# Patient Record
Sex: Female | Born: 1940
Health system: Southern US, Community
[De-identification: ages and names within clinical notes are randomized; demographics above are authoritative.]

## PROBLEM LIST (undated history)

## (undated) DIAGNOSIS — I10 Essential (primary) hypertension: Secondary | ICD-10-CM

## (undated) DIAGNOSIS — M419 Scoliosis, unspecified: Secondary | ICD-10-CM

## (undated) DIAGNOSIS — R1310 Dysphagia, unspecified: Secondary | ICD-10-CM

## (undated) DIAGNOSIS — IMO0001 Reserved for inherently not codable concepts without codable children: Secondary | ICD-10-CM

## (undated) DIAGNOSIS — I639 Cerebral infarction, unspecified: Secondary | ICD-10-CM

## (undated) DIAGNOSIS — C50912 Malignant neoplasm of unspecified site of left female breast: Secondary | ICD-10-CM

## (undated) DIAGNOSIS — F32A Depression, unspecified: Secondary | ICD-10-CM

## (undated) DIAGNOSIS — F419 Anxiety disorder, unspecified: Secondary | ICD-10-CM

## (undated) DIAGNOSIS — M67911 Unspecified disorder of synovium and tendon, right shoulder: Secondary | ICD-10-CM

## (undated) DIAGNOSIS — C50919 Malignant neoplasm of unspecified site of unspecified female breast: Secondary | ICD-10-CM

## (undated) DIAGNOSIS — S42302A Unspecified fracture of shaft of humerus, left arm, initial encounter for closed fracture: Secondary | ICD-10-CM

## (undated) DIAGNOSIS — E039 Hypothyroidism, unspecified: Secondary | ICD-10-CM

## (undated) DIAGNOSIS — G629 Polyneuropathy, unspecified: Secondary | ICD-10-CM

## (undated) DIAGNOSIS — R410 Disorientation, unspecified: Secondary | ICD-10-CM

## (undated) DIAGNOSIS — IMO0002 Reserved for concepts with insufficient information to code with codable children: Secondary | ICD-10-CM

## (undated) DIAGNOSIS — F329 Major depressive disorder, single episode, unspecified: Secondary | ICD-10-CM

## (undated) DIAGNOSIS — K219 Gastro-esophageal reflux disease without esophagitis: Secondary | ICD-10-CM

## (undated) DIAGNOSIS — M0520 Rheumatoid vasculitis with rheumatoid arthritis of unspecified site: Secondary | ICD-10-CM

## (undated) DIAGNOSIS — M810 Age-related osteoporosis without current pathological fracture: Secondary | ICD-10-CM

## (undated) HISTORY — DX: Unspecified disorder of synovium and tendon, right shoulder: M67.911

## (undated) HISTORY — DX: Reserved for concepts with insufficient information to code with codable children: IMO0002

## (undated) HISTORY — PX: DILATION AND CURETTAGE OF UTERUS: SHX78

## (undated) HISTORY — DX: Essential (primary) hypertension: I10

## (undated) HISTORY — PX: BREAST SURGERY: SHX581

## (undated) HISTORY — DX: Malignant neoplasm of unspecified site of left female breast: C50.912

## (undated) HISTORY — DX: Scoliosis, unspecified: M41.9

## (undated) HISTORY — DX: Age-related osteoporosis without current pathological fracture: M81.0

## (undated) HISTORY — PX: ABDOMINAL HYSTERECTOMY: SHX81

## (undated) HISTORY — DX: Unspecified fracture of shaft of humerus, left arm, initial encounter for closed fracture: S42.302A

## (undated) HISTORY — DX: Gastro-esophageal reflux disease without esophagitis: K21.9

## (undated) HISTORY — DX: Rheumatoid vasculitis with rheumatoid arthritis of unspecified site: M05.20

## (undated) HISTORY — PX: COLONOSCOPY: SHX174

## (undated) HISTORY — DX: Malignant neoplasm of unspecified site of unspecified female breast: C50.919

## (undated) HISTORY — PX: OTHER SURGICAL HISTORY: SHX169

---

## 2001-03-18 ENCOUNTER — Ambulatory Visit (HOSPITAL_COMMUNITY): Admission: RE | Admit: 2001-03-18 | Discharge: 2001-03-18 | Payer: Self-pay | Admitting: Internal Medicine

## 2001-03-18 ENCOUNTER — Encounter: Payer: Self-pay | Admitting: Internal Medicine

## 2002-04-18 ENCOUNTER — Ambulatory Visit (HOSPITAL_COMMUNITY): Admission: RE | Admit: 2002-04-18 | Discharge: 2002-04-18 | Payer: Self-pay | Admitting: Internal Medicine

## 2002-04-18 ENCOUNTER — Encounter (INDEPENDENT_AMBULATORY_CARE_PROVIDER_SITE_OTHER): Payer: Self-pay | Admitting: Internal Medicine

## 2002-05-01 ENCOUNTER — Encounter (HOSPITAL_COMMUNITY): Admission: RE | Admit: 2002-05-01 | Discharge: 2002-05-31 | Payer: Self-pay | Admitting: Rheumatology

## 2002-05-01 ENCOUNTER — Encounter: Payer: Self-pay | Admitting: Rheumatology

## 2002-08-21 ENCOUNTER — Encounter (HOSPITAL_COMMUNITY): Admission: RE | Admit: 2002-08-21 | Discharge: 2002-09-20 | Payer: Self-pay | Admitting: Rheumatology

## 2002-08-21 ENCOUNTER — Encounter: Payer: Self-pay | Admitting: Rheumatology

## 2002-12-18 ENCOUNTER — Encounter (HOSPITAL_COMMUNITY): Admission: RE | Admit: 2002-12-18 | Discharge: 2003-01-17 | Payer: Self-pay | Admitting: Rheumatology

## 2005-07-26 ENCOUNTER — Ambulatory Visit (HOSPITAL_COMMUNITY): Admission: RE | Admit: 2005-07-26 | Discharge: 2005-07-26 | Payer: Self-pay | Admitting: Family Medicine

## 2005-08-02 ENCOUNTER — Ambulatory Visit (HOSPITAL_COMMUNITY): Admission: RE | Admit: 2005-08-02 | Discharge: 2005-08-02 | Payer: Self-pay | Admitting: Family Medicine

## 2005-08-23 ENCOUNTER — Ambulatory Visit (HOSPITAL_COMMUNITY): Admission: RE | Admit: 2005-08-23 | Discharge: 2005-08-23 | Payer: Self-pay | Admitting: Family Medicine

## 2005-09-01 ENCOUNTER — Ambulatory Visit (HOSPITAL_COMMUNITY): Admission: RE | Admit: 2005-09-01 | Discharge: 2005-09-01 | Payer: Self-pay | Admitting: General Surgery

## 2005-09-13 ENCOUNTER — Encounter (INDEPENDENT_AMBULATORY_CARE_PROVIDER_SITE_OTHER): Payer: Self-pay | Admitting: General Surgery

## 2005-09-13 ENCOUNTER — Ambulatory Visit (HOSPITAL_COMMUNITY): Admission: RE | Admit: 2005-09-13 | Discharge: 2005-09-13 | Payer: Self-pay | Admitting: General Surgery

## 2005-09-27 ENCOUNTER — Encounter (INDEPENDENT_AMBULATORY_CARE_PROVIDER_SITE_OTHER): Payer: Self-pay | Admitting: General Surgery

## 2005-09-27 ENCOUNTER — Ambulatory Visit (HOSPITAL_COMMUNITY): Admission: RE | Admit: 2005-09-27 | Discharge: 2005-09-27 | Payer: Self-pay | Admitting: General Surgery

## 2005-10-16 ENCOUNTER — Ambulatory Visit (HOSPITAL_COMMUNITY): Payer: Self-pay | Admitting: Oncology

## 2005-10-16 ENCOUNTER — Encounter (HOSPITAL_COMMUNITY): Admission: RE | Admit: 2005-10-16 | Discharge: 2005-11-15 | Payer: Self-pay | Admitting: Oncology

## 2005-10-16 ENCOUNTER — Encounter: Admission: RE | Admit: 2005-10-16 | Discharge: 2005-10-16 | Payer: Self-pay | Admitting: Oncology

## 2005-10-27 ENCOUNTER — Ambulatory Visit: Admission: RE | Admit: 2005-10-27 | Discharge: 2005-12-29 | Payer: Self-pay | Admitting: *Deleted

## 2005-12-01 ENCOUNTER — Encounter: Admission: RE | Admit: 2005-12-01 | Discharge: 2005-12-01 | Payer: Self-pay | Admitting: Oncology

## 2005-12-01 ENCOUNTER — Ambulatory Visit (HOSPITAL_COMMUNITY): Payer: Self-pay | Admitting: Oncology

## 2006-01-01 ENCOUNTER — Encounter: Admission: RE | Admit: 2006-01-01 | Discharge: 2006-01-01 | Payer: Self-pay | Admitting: Oncology

## 2006-01-01 ENCOUNTER — Encounter (HOSPITAL_COMMUNITY): Admission: RE | Admit: 2006-01-01 | Discharge: 2006-01-31 | Payer: Self-pay | Admitting: Oncology

## 2006-01-16 ENCOUNTER — Encounter (INDEPENDENT_AMBULATORY_CARE_PROVIDER_SITE_OTHER): Payer: Self-pay | Admitting: *Deleted

## 2006-01-16 ENCOUNTER — Ambulatory Visit: Payer: Self-pay | Admitting: Internal Medicine

## 2006-01-16 ENCOUNTER — Ambulatory Visit (HOSPITAL_COMMUNITY): Admission: RE | Admit: 2006-01-16 | Discharge: 2006-01-16 | Payer: Self-pay | Admitting: Internal Medicine

## 2006-01-22 ENCOUNTER — Ambulatory Visit (HOSPITAL_COMMUNITY): Payer: Self-pay | Admitting: Oncology

## 2006-02-15 ENCOUNTER — Encounter (HOSPITAL_COMMUNITY): Admission: RE | Admit: 2006-02-15 | Discharge: 2006-03-17 | Payer: Self-pay | Admitting: Oncology

## 2006-02-15 ENCOUNTER — Encounter: Admission: RE | Admit: 2006-02-15 | Discharge: 2006-02-15 | Payer: Self-pay | Admitting: Oncology

## 2006-03-29 ENCOUNTER — Encounter (HOSPITAL_COMMUNITY): Admission: RE | Admit: 2006-03-29 | Discharge: 2006-04-28 | Payer: Self-pay | Admitting: Oncology

## 2006-03-29 ENCOUNTER — Ambulatory Visit (HOSPITAL_COMMUNITY): Payer: Self-pay | Admitting: Oncology

## 2006-03-29 ENCOUNTER — Encounter: Admission: RE | Admit: 2006-03-29 | Discharge: 2006-03-29 | Payer: Self-pay | Admitting: Oncology

## 2006-05-10 ENCOUNTER — Encounter (HOSPITAL_COMMUNITY): Admission: RE | Admit: 2006-05-10 | Discharge: 2006-06-01 | Payer: Self-pay | Admitting: Oncology

## 2006-05-10 ENCOUNTER — Encounter: Admission: RE | Admit: 2006-05-10 | Discharge: 2006-06-01 | Payer: Self-pay | Admitting: Oncology

## 2006-05-25 ENCOUNTER — Ambulatory Visit (HOSPITAL_COMMUNITY): Payer: Self-pay | Admitting: Oncology

## 2006-06-26 ENCOUNTER — Encounter: Admission: RE | Admit: 2006-06-26 | Discharge: 2006-06-26 | Payer: Self-pay | Admitting: Oncology

## 2006-06-26 ENCOUNTER — Encounter (HOSPITAL_COMMUNITY): Admission: RE | Admit: 2006-06-26 | Discharge: 2006-07-26 | Payer: Self-pay | Admitting: Oncology

## 2006-07-20 ENCOUNTER — Ambulatory Visit (HOSPITAL_COMMUNITY): Payer: Self-pay | Admitting: Oncology

## 2006-07-31 ENCOUNTER — Encounter (HOSPITAL_COMMUNITY): Admission: RE | Admit: 2006-07-31 | Discharge: 2006-07-31 | Payer: Self-pay | Admitting: Oncology

## 2006-07-31 ENCOUNTER — Encounter: Admission: RE | Admit: 2006-07-31 | Discharge: 2006-07-31 | Payer: Self-pay | Admitting: Oncology

## 2006-08-08 ENCOUNTER — Ambulatory Visit (HOSPITAL_COMMUNITY): Admission: RE | Admit: 2006-08-08 | Discharge: 2006-08-08 | Payer: Self-pay | Admitting: Family Medicine

## 2006-08-13 ENCOUNTER — Encounter (HOSPITAL_COMMUNITY): Admission: RE | Admit: 2006-08-13 | Discharge: 2006-09-03 | Payer: Self-pay | Admitting: Oncology

## 2006-08-30 ENCOUNTER — Ambulatory Visit (HOSPITAL_COMMUNITY): Admission: RE | Admit: 2006-08-30 | Discharge: 2006-08-30 | Payer: Self-pay | Admitting: Family Medicine

## 2006-09-05 ENCOUNTER — Encounter (HOSPITAL_COMMUNITY): Admission: RE | Admit: 2006-09-05 | Discharge: 2006-10-05 | Payer: Self-pay | Admitting: Oncology

## 2006-09-05 ENCOUNTER — Ambulatory Visit (HOSPITAL_COMMUNITY): Payer: Self-pay | Admitting: Oncology

## 2006-10-01 ENCOUNTER — Ambulatory Visit: Payer: Self-pay | Admitting: Orthopedic Surgery

## 2006-10-02 ENCOUNTER — Encounter (HOSPITAL_COMMUNITY): Admission: RE | Admit: 2006-10-02 | Discharge: 2006-11-01 | Payer: Self-pay | Admitting: Orthopedic Surgery

## 2006-10-18 ENCOUNTER — Encounter (HOSPITAL_COMMUNITY): Admission: RE | Admit: 2006-10-18 | Discharge: 2006-11-17 | Payer: Self-pay | Admitting: Oncology

## 2006-11-06 ENCOUNTER — Ambulatory Visit: Admission: RE | Admit: 2006-11-06 | Discharge: 2006-11-06 | Payer: Self-pay | Admitting: Orthopedic Surgery

## 2006-11-20 ENCOUNTER — Ambulatory Visit: Payer: Self-pay | Admitting: Orthopedic Surgery

## 2006-11-20 ENCOUNTER — Encounter (HOSPITAL_COMMUNITY): Admission: RE | Admit: 2006-11-20 | Discharge: 2006-12-20 | Payer: Self-pay | Admitting: Oncology

## 2006-12-03 ENCOUNTER — Ambulatory Visit (HOSPITAL_COMMUNITY): Payer: Self-pay | Admitting: Oncology

## 2007-06-03 ENCOUNTER — Encounter (HOSPITAL_COMMUNITY): Admission: RE | Admit: 2007-06-03 | Discharge: 2007-06-04 | Payer: Self-pay | Admitting: Oncology

## 2007-06-03 ENCOUNTER — Ambulatory Visit (HOSPITAL_COMMUNITY): Payer: Self-pay | Admitting: Oncology

## 2007-08-14 ENCOUNTER — Encounter (HOSPITAL_COMMUNITY): Admission: RE | Admit: 2007-08-14 | Discharge: 2007-09-04 | Payer: Self-pay | Admitting: Oncology

## 2007-09-12 ENCOUNTER — Ambulatory Visit (HOSPITAL_COMMUNITY): Admission: RE | Admit: 2007-09-12 | Discharge: 2007-09-12 | Payer: Self-pay | Admitting: Family Medicine

## 2007-09-17 ENCOUNTER — Ambulatory Visit (HOSPITAL_COMMUNITY): Admission: RE | Admit: 2007-09-17 | Discharge: 2007-09-17 | Payer: Self-pay | Admitting: Family Medicine

## 2007-11-25 ENCOUNTER — Ambulatory Visit (HOSPITAL_COMMUNITY): Payer: Self-pay | Admitting: Oncology

## 2008-06-16 ENCOUNTER — Encounter (HOSPITAL_COMMUNITY): Admission: RE | Admit: 2008-06-16 | Discharge: 2008-07-16 | Payer: Self-pay | Admitting: Oncology

## 2008-06-16 ENCOUNTER — Ambulatory Visit (HOSPITAL_COMMUNITY): Payer: Self-pay | Admitting: Oncology

## 2008-07-13 ENCOUNTER — Ambulatory Visit (HOSPITAL_COMMUNITY): Admission: RE | Admit: 2008-07-13 | Discharge: 2008-07-13 | Payer: Self-pay | Admitting: Family Medicine

## 2008-07-13 ENCOUNTER — Encounter: Payer: Self-pay | Admitting: Orthopedic Surgery

## 2008-07-15 DIAGNOSIS — M069 Rheumatoid arthritis, unspecified: Secondary | ICD-10-CM

## 2008-07-16 ENCOUNTER — Ambulatory Visit: Payer: Self-pay | Admitting: Orthopedic Surgery

## 2008-07-16 DIAGNOSIS — S52539A Colles' fracture of unspecified radius, initial encounter for closed fracture: Secondary | ICD-10-CM | POA: Insufficient documentation

## 2008-07-29 ENCOUNTER — Telehealth: Payer: Self-pay | Admitting: Orthopedic Surgery

## 2008-08-17 ENCOUNTER — Ambulatory Visit: Payer: Self-pay | Admitting: Orthopedic Surgery

## 2008-08-17 ENCOUNTER — Ambulatory Visit (HOSPITAL_COMMUNITY): Admission: RE | Admit: 2008-08-17 | Discharge: 2008-08-17 | Payer: Self-pay | Admitting: Orthopedic Surgery

## 2008-08-20 ENCOUNTER — Encounter (HOSPITAL_COMMUNITY): Admission: RE | Admit: 2008-08-20 | Discharge: 2008-09-02 | Payer: Self-pay | Admitting: Oncology

## 2008-12-01 ENCOUNTER — Ambulatory Visit (HOSPITAL_COMMUNITY): Payer: Self-pay | Admitting: Oncology

## 2009-06-14 ENCOUNTER — Ambulatory Visit (HOSPITAL_COMMUNITY): Payer: Self-pay | Admitting: Oncology

## 2009-07-13 ENCOUNTER — Ambulatory Visit (HOSPITAL_COMMUNITY): Admission: RE | Admit: 2009-07-13 | Discharge: 2009-07-13 | Payer: Self-pay | Admitting: Family Medicine

## 2009-07-21 ENCOUNTER — Ambulatory Visit (HOSPITAL_COMMUNITY): Admission: RE | Admit: 2009-07-21 | Discharge: 2009-07-21 | Payer: Self-pay | Admitting: Family Medicine

## 2009-09-02 ENCOUNTER — Ambulatory Visit (HOSPITAL_COMMUNITY): Admission: RE | Admit: 2009-09-02 | Discharge: 2009-09-02 | Payer: Self-pay | Admitting: Oncology

## 2010-01-17 ENCOUNTER — Ambulatory Visit (HOSPITAL_COMMUNITY): Payer: Self-pay | Admitting: Oncology

## 2010-07-22 ENCOUNTER — Ambulatory Visit (HOSPITAL_COMMUNITY): Payer: Self-pay | Admitting: Oncology

## 2010-07-22 ENCOUNTER — Encounter (HOSPITAL_COMMUNITY)
Admission: RE | Admit: 2010-07-22 | Discharge: 2010-08-21 | Payer: Self-pay | Source: Home / Self Care | Attending: Oncology | Admitting: Oncology

## 2010-09-21 ENCOUNTER — Ambulatory Visit (HOSPITAL_COMMUNITY)
Admission: RE | Admit: 2010-09-21 | Discharge: 2010-09-21 | Payer: Self-pay | Source: Home / Self Care | Attending: Internal Medicine | Admitting: Internal Medicine

## 2010-09-25 ENCOUNTER — Encounter: Payer: Self-pay | Admitting: Family Medicine

## 2010-09-25 ENCOUNTER — Encounter (HOSPITAL_COMMUNITY): Payer: Self-pay | Admitting: Oncology

## 2011-01-20 NOTE — Consult Note (Signed)
NAME:  [name], [name]                         ACCOUNT NO.:  [id number]   MEDICAL RECORD NO.:  [id number]                   PATIENT TYPE:   LOCATION:                                       FACILITY:   PHYSICIAN:  Aundra Dubin, M.D.            DATE OF BIRTH:   DATE OF CONSULTATION:  08/21/2002  DATE OF DISCHARGE:                                   CONSULTATION   CHIEF COMPLAINT:  Hands and back.   HISTORY OF PRESENT ILLNESS:  The patient continues to do well using low-dose  prednisone.  She is not having hand or wrist pain at this time.  Her feet  still hurt from known degenerative arthritic changes.  Her main complaint  today is having pain in the right low back that goes around to the right hip  and down some her leg.  This is a throbbing, aching pain.  It can keep her  awake some at night.  Her weight is stable.  There have been no overtly  swollen joints, polyuria, shortness of breath, chest pain, blood to the  stool, or headaches.   MEDICINES:  1. Prednisone 5 mg q.d.  2. Aciphex 20 mg q.d.  3. Calcium with vitamin D b.i.d.   PHYSICAL EXAMINATION:  VITAL SIGNS:  Weight 143 pounds, blood pressure  120/82, respirations 16.  GENERAL:  No distress.  SKIN:  No bruising, clear.  LUNGS:  Clear.  NECK:  Negative JVD, no adenopathy.  HEART:  Regular, no murmur.  LOWER EXTREMITIES:  No edema.  MUSCULOSKELETAL:  The hands appear more to be degenerative with bony  enlargement of the PIPs.  The thumb MCP is somewhat enlarged but other MCPs  are not swollen.  There is some squaring to the first [facility].  Wrists, elbows,  shoulders with good range of motion.  Hips, knees, ankles, and feet have a  good range of motion and show no arthritis except for degenerative changes  at the first MTP.  The right trochanteric bursa is quite tender.   ASSESSMENT AND PLAN:  1. Polyarthritis.  I suspect this is more of a degenerative arthritis.     However, she has significantly improved with the  prednisone and I will     need to work with her to see what type of further arthritic pattern may     emerge.  The plan is for her take 5 mg every-other day over about 10 days     and she will stop the medicine.  I have given her a prescription for     Vioxx 25 mg one q.d.  2. Back pain/possible trochanteric bursitis.  I have shown her how to     stretch this area and have discussed using     heat.  We will have her back x-rayed to rule out any significant OA.  I     have given her Darvocet one b.i.d.  p.r.n. that she can use during the day     and to help with sleep.   She will return in three months.                                               Aundra Dubin, M.D.    WWT/MEDQ  D:  08/21/2002  T:  08/22/2002  Job:  [id number]   cc:   Bernerd Limbo. Leona Carry, M.D.  [address]  [place]  [place] [address]  Fax: [phone]   Lionel December, M.D.  [address]    [place] [address]  Fax: [phone]

## 2011-01-20 NOTE — Procedures (Signed)
NAME:  [name], [name]                   ACCOUNT NO.:  0   MEDICAL RECORD NO.:  [id number]           PATIENT TYPE:   LOCATION:                                 FACILITY:   PHYSICIAN:  Dani Gobble, MD       DATE OF BIRTH:  Oct 29, 1940   DATE OF PROCEDURE:  10/26/2005  DATE OF DISCHARGE:                                  ECHOCARDIOGRAM   REFERRING PHYSICIAN:  Ladona Horns. Mariel Sleet, M.D.   INDICATIONS:  A 70 year old female with breast cancer who is referred for  evaluation of LV function prior chemotherapy.   The technical quality of the study is adequate.   M-MODE TRACINGS:  The aorta measures normally at 2.6 cm.   Left atrium measures normally at 3.8 cm.  No obvious clots or masses were  appreciated and the patient appeared to be in sinus rhythm during the  procedure.   Interventricular septum and posterior wall are at the upper limits of normal  in thickness.   The aortic valve is trileaflet and pliable with normal leaflet excursion.  There is minimal leaflet thickening. There is no limitation of leaflet  excursion.  No significant aortic insufficiency is noted.  Doppler  interrogation of the aortic valve is within normal limits.   The mitral valve also appears structurally normal.  No mitral valve prolapse  is noted.  Mild mitral regurgitation is noted.  Doppler interrogation of the  mitral valve is within normal limits.   The pulmonic valve appears grossly structurally normal.   Tricuspid valve appears grossly structurally normal with mild tricuspid  regurgitation noted.   Left ventricle is normal in size with the LVIDD measured at 4.2 cm and LVISD  measured at 2.9 cm.  Overall left ventricular systolic function is normal  and no regional wall motion abnormalities are noted.   The right atrium and right ventricle are normal in size and right  ventricular systolic function is normal.  No pericardial effusion is  appreciated on this study.   IMPRESSION:  1.  Minimally  thickened aortic valve with mild aortic sclerosis, but no      aortic stenosis.  2.  Mild mitral and tricuspid regurgitation.  3.  Normal left ventricular size and systolic function without regional wall      motion abnormalities noted.           ______________________________  Dani Gobble, MD     AB/MEDQ  D:  10/26/2005  T:  10/26/2005  Job:  [id number]   cc:   Ladona Horns. Mariel Sleet, MD  Fax: [phone]

## 2011-01-20 NOTE — Op Note (Signed)
NAMEWANA, MOUNT               ACCOUNT NO.:  [id number]   MEDICAL RECORD NO.:  [id number]          PATIENT TYPE:  AMB   LOCATION:  DAY                           FACILITY:  [facility]   PHYSICIAN:  Dalia Heading, M.D.  DATE OF BIRTH:  Jan 11, 1941   DATE OF PROCEDURE:  09/27/2005  DATE OF DISCHARGE:                                 OPERATIVE REPORT   PREOPERATIVE DIAGNOSIS:  Left breast carcinoma.   POSTOPERATIVE DIAGNOSIS:  Left breast carcinoma.   PROCEDURE:  Left partial mastectomy.   SURGEON:  Dr. Franky Macho.   ANESTHESIA:  General endotracheal.   INDICATIONS:  The patient is a 70 year old white female status post a left  partial mastectomy with sentinel lymph node biopsy for left breast carcinoma  of September 13, 2005 who was found on microscopic examination to have  carcinoma at the deep margin. She now presents for re-excision of that area.  Risks and benefits of the procedure including bleeding and infection were  fully explained to the patient, who gave informed consent.   PROCEDURE NOTE:  The patient was placed in the supine position. After  induction of general endotracheal anesthesia, the left breast was prepped  and draped using the usual sterile technique with Betadine. Surgical site  confirmation was performed.   Incision was made through the previous lateral breast incision. The  dissection was taken down into the pocket. Serosanguineous fluid was  evacuated. A circumferential dissection around the previous partial  mastectomy cavity was performed. Approximately 2 to 3 cm thickness of  additional tissue was removed. The superior margin was marked with a short  suture, and the lateral margin was marked with a long suture. This was sent  to pathology for further examination. Any bleeding was controlled using  Bovie electrocautery. The wound was irrigated with normal saline. The  subcutaneous layer was reapproximated using 3-0 Vicryl interrupted suture.  The skin  was closed using a 4-0 Vicryl subcuticular suture. Sensorcaine 0.5%  was instilled in the surrounding wound. Dermabond was then applied.   All tape and needle counts were correct at the end of the procedure. The  patient was extubated in the operating room and went back to recovery room  awake in stable condition.   COMPLICATIONS:  None.   SPECIMEN:  Additional breast tissue, left breast.   BLOOD LOSS:  Minimal.      Dalia Heading, M.D.  Electronically Signed     MAJ/MEDQ  D:  09/27/2005  T:  09/27/2005  Job:  [id number]   cc:   Patrica Duel, M.D.  Fax: [phone]

## 2011-01-20 NOTE — H&P (Signed)
[name], [name]               ACCOUNT NO.:  [id number]   MEDICAL RECORD NO.:  [id number]          PATIENT TYPE:  AMB   LOCATION:                                FACILITY:  [facility]   PHYSICIAN:  Dalia Heading, M.D.  DATE OF BIRTH:  1941/05/09   DATE OF ADMISSION:  DATE OF DISCHARGE:  LH                                HISTORY & PHYSICAL   CHIEF COMPLAINT:  Left breast carcinoma.   HISTORY OF PRESENT ILLNESS:  Patient is a 70 year old white female who is  referred for evaluation and treatment of a left breast cancer.  This was  found on routine mammography.  Biopsy reveals an infiltrating ductal  carcinoma.  There is no family history of breast carcinoma.   PAST MEDICAL HISTORY:  Arthritis, extrinsic allergies, anxiety.   PAST SURGICAL HISTORY:  Hysterectomy.   CURRENT MEDICATIONS:  Methotrexate which she is holding, Singulair 10 mg  p.o. p.r.n., Xanax 0.5 mg p.o. when needed, Prilosec one tablet p.o. daily,  calcium supplements, folic acid supplements, stool softener as needed.   ALLERGIES:  No known drug allergies.   REVIEW OF SYSTEMS:  Noncontributory.   PHYSICAL EXAMINATION:  On physical examination, patient is a well-developed,  well-nourished white female in no acute distress.  Neck is supple without  lymphadenopathy.  Lungs clear to auscultation with equal breath sounds  bilaterally.  Heart examination reveals a regular rate and rhythm without  S3, S4, or murmurs.  Right breast examination reveals no dominant mass,  nipple discharge, or dimpling.  The axilla is negative for palpable nodes.  Left breast examination reveals a dominant mass in the upper, outer  quadrant.  No nipple discharge or dimpling is noted.  The axilla is negative  for palpable nodes.  MRI reveals no other lesions except at the one scene.   IMPRESSION:  Infiltrating ductal carcinoma, left breast.   PLAN:  The patient is scheduled to undergo a left partial mastectomy,  sentinel lymph node biopsy,  possible axillary dissection on September 13, 2005.  The risks and benefits of the procedure including bleeding,  infection, and the possibility of an axillary dissection were fully  explained to the patient, who gave informed consent.      Dalia Heading, M.D.  Electronically Signed     MAJ/MEDQ  D:  09/07/2005  T:  09/07/2005  Job:  [id number]   cc:   [facility]  Fax: [phone]   Patrica Duel, M.D.  Fax: [phone]

## 2011-01-20 NOTE — Procedures (Signed)
NAMEPAULA, [name] NO.:  [id number]   MEDICAL RECORD NO.:  [id number]          PATIENT TYPE:  REC   LOCATION:                                FACILITY:  [facility]   PHYSICIAN:  Dani Gobble, MD       DATE OF BIRTH:  12-21-1940   DATE OF PROCEDURE:  04/19/2006  DATE OF DISCHARGE:                                  ECHOCARDIOGRAM   REFERRING:  Ladona Horns. Mariel Sleet, M.D.   INDICATIONS:  A 70 year old female who was referred to evaluate LV function  status post of chemotherapy for breast cancer.   Technical quality of this study is adequate.   The aorta measures normally at 2.8 cm.   Left atrium also measures normally at 3.6 cm.  The patient appeared to be in  sinus rhythm during the procedure.   The interventricular septum is mildly thickened, measured at 1.3 cm while  the posterior wall is within normal limits at 1.1 cm.   The aortic valve is trileaflet with normal leaflet excursion.  Trivial  aortic insufficiency is noted.  Doppler interrogation aortic valve is within  normal limits.   The mitral valve also appears structurally normal.  No mitral valve prolapse  is noted.  Mild mitral regurgitation is noted.  Doppler interrogation of  mitral valve is within normal limits.   The pulmonic valve appears grossly structurally normal.   The tricuspid valve also appears structurally normal with mild tricuspid  regurgitation noted.   The left ventricle is normal in size with the LV/IDD measured at 4.2 cm and  LV/ISD measured at 3.2 cm.  In most views, the overall left ventricular  systolic function appears to be entirely normal.  In the apical two chamber  view, the overall ejection fraction appeared to be at the lower limits of  normal at 50% while in the remaining views it appeared to be entirely normal  and greater than 60-65%.  No regional wall motion abnormalities were noted.  Presence of diastolic dysfunction is inferred from pulse wave Doppler across  the  mitral valve.   The right atrium and right ventricle are normal size and right ventricular  systolic function is normal.   IMPRESSION:  1. Mild thickening of the interventricular septum.  2. Trivial aortic insufficiency.  3. Mild mitral regurgitation.  4. Mild tricuspid regurgitation.  5. Normal left ventricular size with overall ejection fraction in most      views estimated at greater than 60-65% but in the apical two to three      chamber view it appeared to be at the lower limits of normal.  No      regional wall motion abnormality is appreciated.  6. As compared to the prior echocardiogram in May of this year, there is      likely no significant change.           ______________________________  Dani Gobble, MD     AB/MEDQ  D:  04/19/2006  T:  04/19/2006  Job:  [id number]   cc:   Ladona Horns. Mariel Sleet, MD  Fax: [phone]

## 2011-01-20 NOTE — Op Note (Signed)
[name], [name]                         ACCOUNT NO.:  [id number]   MEDICAL RECORD NO.:  [id number]                   PATIENT TYPE:  AMB   LOCATION:  DAY                                  FACILITY:  [facility]   PHYSICIAN:  Lionel December, M.D.                 DATE OF BIRTH:  [birth year]   DATE OF PROCEDURE:  04/18/2002  DATE OF DISCHARGE:                                 OPERATIVE REPORT   PROCEDURE:  Esophagogastroduodenoscopy with esophageal dilatation.   ENDOSCOPIST:  Lionel December, M.D.   INDICATIONS:  This patient is a 70 year old Caucasian female with a history  of GERD who now presents with solid food dysphagia.  Today she is also  complaining of continuous excruciating pain in her left wrist.  She has not  had any relief with NSAIDS.   The procedures and risks were reviewed with the patient and informed consent  was obtained.   PREOPERATIVE MEDICATIONS:  Cetacaine spray for pharyngeal topical  anesthesia, Demerol 50 mg IV and Versed 5 mg IV in divided dose.   INSTRUMENT:  Olympus video system.   FINDINGS:  Procedure performed in endoscopy suite.  The patient's vital  signs and O2 saturation were monitored during the procedure and remained  stable.  The patient was placed in the left lateral recumbent position and  endoscope was passed via the oropharynx without any difficulty into the  esophagus.   ESOPHAGUS:  Mucosa of the esophagus normal except that she had esophageal  erosions and esophagogastric junction.  No hernia was noted.   STOMACH:  It was empty and distended very well with insufflation.  The folds  of the proximal stomach were normal.  Examination of the mucosa revealed  multiple antral erosions along with two ulcers, one was in the prepyloric  area.  The pyloric channel was patent.  The angularis fundus examined by  reflexing the scope and was normal.   DUODENUM:  Examination of the bulb revealed patchy erythema, but no erosions  or ulcers noted.   Mucosa and folds of the second part of the duodenum were  normal.  Endoscope was withdrawn.   Esophageal dilation was performed by passing 56 French Maloney dilator  through the esophagus completely.  After dilatation was complete the  endoscope was passed again and esophageal stricture was noted to have been  disrupted.  The stomach was deep and the endoscope was withdrawn.  The  patient was tolerated the procedure well.   FINAL DIAGNOSES:  1. Erosive reflux esophagitis with soft stricture at esophagogastric     junction which was dilated by passing 56 [place] Maloney dilator.  2. Erosive gastritis with duodenitis along with 2 small antral ulcers.     These are most likely secondary to nonsteroidal anti-inflammatory drug     therapy.   PLAN:  1. Antireflux measures.  2. We will switch her to Aciphex 20 mg p.o. q.a.m.  3. As discussed with Dr. Leona Carry over the phone, she will have extra films     of the left wrist today as a workup for her continuous left wrist pain.                                               Lionel December, M.D.    NR/MEDQ  D:  04/18/2002  T:  04/24/2002  Job:  [id number]

## 2011-01-20 NOTE — H&P (Signed)
NAME:  [name], [name]               ACCOUNT NO.:  [id number]   MEDICAL RECORD NO.:  [id number]          PATIENT TYPE:  AMB   LOCATION:                                FACILITY:  [facility]   PHYSICIAN:  Dalia Heading, M.D.  DATE OF BIRTH:  1940/11/17   DATE OF ADMISSION:  DATE OF DISCHARGE:  LH                                HISTORY & PHYSICAL   CHIEF COMPLAINT:  Left breast carcinoma, unclear margins.   HISTORY OF PRESENT ILLNESS:  The patient is a 70 year old white female  status post a left partial mastectomy with a sentinel lymph node biopsy for  left breast carcinoma on September 13, 2005 who was found on pathology to have  carcinoma at the deep margin.  She now presents for re-excision of that  area.   PAST MEDICAL HISTORY:  Includes arthritis, EXTRINSIC ALLERGIES, anxiety.   PAST SURGICAL HISTORY:  As noted above and hysterectomy.   CURRENT MEDICATIONS:  Singulair, Xanax, Prilosec, calcium supplements, folic  acid supplements.   ALLERGIES:  No known drug allergies.   REVIEW OF SYSTEMS:  Noncontributory.   PHYSICAL EXAMINATION:  GENERAL APPEARANCE:  Patient is a well-developed,  well-nourished white female in no acute distress.  NECK:  Supple without lymphadenopathy.  LUNGS:  Clear to auscultation with equal breath sounds bilaterally.  HEART:  Examination reveals regular rate and rhythm without S3, S4 or  murmurs.  BREASTS:  Left breast examination reveals well-healing surgical incision in  the upper, outer quadrant of the left breast.  The left axillary incision is  also healing well.   IMPRESSION:  Left breast carcinoma, malignancy at deep surgical margin.   PLAN:  The patient is scheduled for re-excision of left breast left partial  mastectomy on September 27, 2005.  The risks and benefits of the procedure  including bleeding, infection, and the possibility of needing further  surgery were fully explained to the patient, who gave informed consent.      Dalia Heading,  M.D.  Electronically Signed     MAJ/MEDQ  D:  09/19/2005  T:  09/19/2005  Job:  [id number]   cc:   Patrica Duel, M.D.  Fax: [phone]   [facility]  Fax: [phone]

## 2011-01-20 NOTE — Consult Note (Signed)
NAME:  [name], [name]                         ACCOUNT NO.:  [id number]   MEDICAL RECORD NO.:  [id number]                   PATIENT TYPE:   LOCATION:                                       FACILITY:   PHYSICIAN:  Aundra Dubin, M.D.            DATE OF BIRTH:   DATE OF CONSULTATION:  12/18/2002  DATE OF DISCHARGE:                                   CONSULTATION   CHIEF COMPLAINT:  1. Low back pain.  2. Right hip pain.   HISTORY OF PRESENT ILLNESS:  The patient is a 70 year old woman whom I  started working with in August 2003 because of hand swelling.  This has  disappeared, and she remains off of prednisone.  She had her lumbar spine x-  rayed in December, and I have reviewed the x-rays with her today; this shows  mild anterior spurring to several vertebrae, and there is slight joint space  loss at L2-3.  There are also facette arthropathy changes.  Her worse  complaint is hurting in the right lateral hip area.  She also hurts across  the low back.  She says when she gets up in the morning, these pains  disappear.  If she lies, she starts to hurt in the lower left abdominal area  and across the low back.  She says there is aching and sometimes radiating  pain down the right hip and leg.  Her weight is stable and is up 2 pounds.  There have been no further swollen joints to the hands, wrists, knees,  ankles, or feet.  She has found that the Darvocet and Vioxx have not helped.   MEDICATIONS:  1. Prilosec 20 mg q.d.  2. Calcium with vitamin D b.i.d.  3. Tylenol 1000 mg h.s.   PHYSICAL EXAMINATION:  VITAL SIGNS:  Weight 145 pounds, blood pressure  122/76, respirations 18.  GENERAL:  She appears well.  LUNGS:  Clear.  HEART:  Regular.  No murmur.  NECK:  Negative JVD.  ABDOMEN:  Nontender.  MUSCULOSKELETAL EXAM:  She has some mild degenerative changes to the DIPs  and squaring at the first [facility]; these areas are not swollen and are  nontender.  Wrists, elbows, shoulders,  neck, knees, ankles, and feet have a  good range of motion and show no arthritis.  She has mild tenderness across  the low back.  The right trochanteric bursa is very tender with wincing.   PROCEDURE:  Right trochanteric bursa injection.  The skin was marked and  cleaned with alcohol and cooled with methylchloride.  Then, 40 mg of Depo-  Medrol in 1 cc of 2% lidocaine was injected.   ASSESSMENT AND PLAN:  1. Trochanteric bursitis.  This has been injected, as above.  She is advised     to rest for about 24 hours and resume normal activities.  2. Back pain.  The type of back pain that she is  describing is quite     atypical for arthritis.  Usually, backs feel better once the weight of     standing is removed; this is not her course.  She also has some mild left     abdominal pain.  I do wonder if this is radiating pain from the bowel.     Part of her difficulty is the bursitis, which has been injected, as     above.  3. Insomnia.  I have given her Xanax to try at night.  She has used this     before and has found it helpful.  4. Osteoarthritis of hands.  This is clearly not an inflammatory arthritis.     Tylenol can help this.  5. She will return in three months.                                               Aundra Dubin, M.D.    WWT/MEDQ  D:  12/18/2002  T:  12/18/2002  Job:  [id number]   cc:   Bernerd Limbo. Leona Carry, M.D.  [address]  [place]  [place] [address]  Fax: [phone]   Lionel December, M.D.  [address]    [place] [address]  Fax: [phone]

## 2011-01-20 NOTE — Op Note (Signed)
[name], [name]               ACCOUNT NO.:  [id number]   MEDICAL RECORD NO.:  [id number]          PATIENT TYPE:  AMB   LOCATION:  DAY                           FACILITY:  [facility]   PHYSICIAN:  Dalia Heading, M.D.  DATE OF BIRTH:  1940/12/20   DATE OF PROCEDURE:  09/13/2005  DATE OF DISCHARGE:                                 OPERATIVE REPORT   PREOPERATIVE DIAGNOSIS:  Left breast carcinoma.   POSTOPERATIVE DIAGNOSIS:  Left breast carcinoma.   PROCEDURE:  Left partial mastectomy, sentinel lymph node biopsy.   SURGEON:  Dr. Franky Macho.   ANESTHESIA:  General endotracheal.   INDICATIONS:  The patient is a 70 year old white female who was diagnosed by  core biopsy by the radiology department at [facility] to have  infiltrating ductal carcinoma in the upper, outer quadrant of the left  breast. MRI was negative for any other mass lesions. The patient now comes  to the operating for left partial mastectomy with sentinel lymph node  biopsy, possible axillary dissection. Risks and benefits of procedure  including bleeding, infection, pain, nerve injury, and the possibility an  axillary dissection were fully explained to the patient, who gave informed  consent.   PROCEDURE NOTE:  The patient was placed in supine position. After induction  of general endotracheal anesthesia, the left breast and axilla were prepped  and draped using the usual sterile technique with Betadine. The patient had  already been injected with radioactive nuclide two hours earlier. The  patient also had been injected with blue dye which was massaged into placed  for five minutes prior to prepping. Surgical site confirmation was  performed.   Using the [employer], an incision was made in the left axilla. This  was taken down to the first sentinel lymph node. The skin counts were 367,  in vivo count 199, ex vivo count 130. The lymph node was blue. A second  sentinel lymph node was found.  Its ex vivo count was 440, and the lymph node  was blue. Basing counts were less than 25. Both lymph nodes were sent for  frozen section. Both lymph nodes were negative for malignancy. There was no  abnormal bleeding noted at the end procedure. The subcutaneous layer was  reapproximated using a 3-0 Vicryl interrupted suture. Skin was closed using  a 4-0 Vicryl subcuticular suture. Sensorcaine 0.5% was instilled in the  surrounding wound. Dermabond was then applied.   A curvilinear incision was then made in the upper, outer quadrant of the  left breast. This was taken down to the mass, and the mass was excised  without difficulty. Normal tissue appeared to be around the mass. The mass  was sent to radiography for specimen radiography. A previously placed  titanium clip was found in the center of the mass removed. The specimen was  then sent to pathology for further examination. A short suture was placed  superiorly, long suture lateral to identify orientation. Any bleeding was  controlled using Bovie electrocautery. Wound was irrigated normal saline.  Subcutaneous layer was reapproximated using a 3-0 Vicryl  interrupted suture.  The skin was closed using a 4-0 Vicryl subcuticular suture. Sensorcaine 0.5%  was instilled into the surrounding wound. Dermabond was then applied.   All tape and needle counts were correct at the end of the procedure. The  patient was extubated in the operating room and went back to recovery room  awake in stable condition.   COMPLICATIONS:  None.   SPECIMEN:  1.  Sentinel lymph node biopsies, left axilla.  2.  Left breast tissue.   BLOOD LOSS:  Minimal.      Dalia Heading, M.D.  Electronically Signed     MAJ/MEDQ  D:  09/13/2005  T:  09/13/2005  Job:  [id number]   cc:   Patrica Duel, M.D.  Fax: [phone]

## 2011-01-20 NOTE — Procedures (Signed)
NAMEMACEE, VENABLES NO.:  [id number]   MEDICAL RECORD NO.:  [id number]          PATIENT TYPE:  REC   LOCATION:                                FACILITY:  [facility]   PHYSICIAN:  Dani Gobble, MD       DATE OF BIRTH:  1941/01/02   DATE OF PROCEDURE:  01/18/2006  DATE OF DISCHARGE:                                  ECHOCARDIOGRAM   REFERRING:  Dr.  Mariel Sleet   INDICATIONS:  A 70 year old female referred to evaluate LV function status  post chemotherapy for breast cancer.   The technical quality of the study is adequate.   The aorta measures normal at 3 cm.   The left atrium also measures normally at 3.7 cm.  The patient appeared to  be in sinus rhythm during this procedure.  No obvious clots or masses were  appreciated.   The intraventricular septum and posterior wall were borderline thickened.   The aortic valve is trileaflet and mildly thickened, but without limitation  to leaflet excursion.  There is no aortic stenosis.  No aortic insufficiency  is noted.   The mitral valve appears minimally thickened, but with no restriction to  leaflet excursion.  Trivial mitral regurgitation is noted.  No mitral valve  prolapse is noted.  Doppler interrogation of the mitral valve is within  normal limits.   Pulmonic valve is incompletely visualized, but appeared to be grossly  structurally normal with trivial pulmonic insufficiency noted.   The tricuspid valve is poorly visualized.   The left ventricle is normal in size with the LV IDD measured at 4 cm and LV  ISD measured at 3 cm.  Overall left systolic function is normal and no  regional wall motion abnormalities are noted.  There is no suggestion of  diastolic dysfunction on this study.   The right atrium and right ventricle are not well visualized, but right  ventricular systolic function overall appears to be normal and the right  atrial size appears to be normal as well.   IMPRESSION:  1.  Borderline  concentric left ventricular hypertrophy.  2.  Trivial mitral regurgitation.  3.  Normal left ventricular size and systolic function without regional wall      motion abnormality noted.  4.  Trivial mitral and pulmonic insufficiency.  5.  Mild aortic sclerosis without stenosis.  6.  As compared to prior echocardiogram from February 2007 there appears to      be no significant change.           ______________________________  Dani Gobble, MD     AB/MEDQ  D:  01/18/2006  T:  01/19/2006  Job:  [id number]

## 2011-01-20 NOTE — Op Note (Signed)
NAMEFRANCYNE, [name]               ACCOUNT NO.:  [id number]   MEDICAL RECORD NO.:  [id number]          PATIENT TYPE:  AMB   LOCATION:  DAY                           FACILITY:  [facility]   PHYSICIAN:  Lionel December, M.D.    DATE OF BIRTH:  February 23, 1941   DATE OF PROCEDURE:  01/16/2006  DATE OF DISCHARGE:                                 OPERATIVE REPORT   PROCEDURE:  Colonoscopy.   INDICATIONS:  [name] is a 70 year old Caucasian female who is here for  screening colonoscopy.  Family history is negative for colorectal carcinoma  and personal history is positive for breast carcinoma.  Procedures were  reviewed with the patient and informed consent was obtained.   MEDS FOR CONSCIOUS SEDATION:  Demerol 50 mg IV, Versed 6 mg IV.   FINDINGS:  Procedure performed in endoscopy suite.  The patient's vital  signs and O2 sat were monitored during the procedure and remained stable.  The patient was placed in the left lateral decubitus position and rectal  examination performed.  No abnormality noted on external or digital exam.  The Olympus videoscope was placed in the rectum and advanced under vision  into the sigmoid colon over a very acute bend.  Redundant sigmoid colon.  Using abdominal pressure and different positions, the scope was advanced  into the splenic flexure and further intubation of the cecum was easy.  The  cecum was identified by ileocecal valve and appendiceal orifice/stump.  There was a small polyp on the folds above the appendiceal orifice which was  ablated via cold biopsy.  As the scope was withdrawn, the colonic mucosa was  once again carefully examined and was normal throughout.  The rectal mucosa  similarly was normal.  The scope was retroflexed to examine the anorectal  junction and small hemorrhoids were noted below the dentate line. The  endoscope was straightened and withdrawn.  The patient tolerated the  procedure well.   FINAL DIAGNOSIS:  Small polyp ablated via cold  biopsy from the cecum.   Small external hemorrhoids, otherwise normal examination. Small cecal polyp  which was ablated via cold biopsy.  External hemorrhoids.   RECOMMENDATIONS:  Standard instructions given. I will be contacting the  patient with results of biopsy and further recommendations.      Lionel December, M.D.  Electronically Signed     NR/MEDQ  D:  01/16/2006  T:  01/16/2006  Job:  [id number]   cc:   Ladona Horns. Mariel Sleet, MD  Fax: [phone]

## 2011-01-20 NOTE — Procedures (Signed)
[name], [name] NO.:  [id number]   MEDICAL RECORD NO.:  [id number]         PATIENT TYPE:  REC   LOCATION:                                FACILITY:  [facility]   PHYSICIAN:  Dani Gobble, MD       DATE OF BIRTH:  [birth year]   DATE OF PROCEDURE:  DATE OF DISCHARGE:                                  ECHOCARDIOGRAM   REFERRING:  Dr. Mariel Sleet.   INDICATION:  Follow-up chemotherapy of LV function.   Technical quality of this study is adequate.   The aorta measures normally at 2.7 cm.   The left atrium also measures normally at 3.8 cm.   The left ventricular septum is mildly thickened at 1.2 cm while the  posterior wall is within normal limits at 1.0 cm.   The aortic valve is trileaflet and mildly thickened but without limitation  to leaflet excursion.  No significant aortic insufficiency is noted.  Doppler interrogation of the aortic valve is within normal limits.   The mitral valve also appears structurally normal.  No mitral valve prolapse  noted.  Mild mitral regurgitation is noted.  Doppler interrogation of mitral  valve is within normal limits.   Pulmonic valve appears structurally normal.  No significant pulmonic  insufficiency is noted.   Tricuspid valve also appeared grossly structurally normal with mild  tricuspid regurgitation noted.   The left ventricle is normal in size with LV IDD measured at 3.9 cm LV ISD  measured at 2.9 cm.  Overall left ventricular systolic function is normal.  In one view only (apical three chamber), the anterior septal wall appeared  mildly hypokinetic.  However, in all other views of the anterior septal wall  it appeared to thicken normally with systole.  Overall ejection fraction,  again, is normal.  The right atrium and right ventricle are normal in size  and right ventricular systolic function is normal.  No pericardial effusion  is noted.   IMPRESSION:  1. Mild asymmetric septal hypertrophy.  2. Mild aortic  sclerosis without stenosis.  3. Mild mitral and tricuspid regurgitation.  4. Normal left ventricular size and overall ejection fraction.  In one      view only, (apical three chamber) the anterior septal wall appeared      mildly hypokinetic but in all other views the anterior septal wall      appeared to thicken normally with systole.  5. As compared to prior echo in August of this year, aortic insufficiency      was not appreciated on this study and      the anterior septal wall abnormality in one view only was newly noted.      However, this was not confirmed in multiple views.  There likely is no      significant change from prior echo.  Consider a stress Myoview if one      has not been performed recently.           ______________________________  Dani Gobble, MD     AB/MEDQ  D:  07/31/2006  T:  08/01/2006  Job:  [id number]   cc:   Ladona Horns. Mariel Sleet, MD  Fax: [phone]

## 2011-01-20 NOTE — Consult Note (Signed)
NAME:  MARIO, VOONG NO.:  [id number]   MEDICAL RECORD NO.:  [id number]                   PATIENT TYPE:   LOCATION:                                       FACILITY:   PHYSICIAN:  Aundra Dubin, M.D.            DATE OF BIRTH:   DATE OF CONSULTATION:  05/01/2002  DATE OF DISCHARGE:                                   CONSULTATION   CHIEF COMPLAINT:  Arm and other pains, swelling.   HISTORY OF PRESENT ILLNESS:  Thank you for allowing me to help in the  patient's care.  She is the 70 year old white female whom we discussed by  telephone one week ago.  She has been having significant pain in the left  wrist with some swelling.  There is also pain in the right wrist but this is  not as severe.  She has had a left wrist x-ray, which I have reviewed, which  shows degenerative arthritic change at the first [facility].  Also, her left  Achilles' tendon has been swollen for about three months.  Her knees ache,  particularly with getting up and down from a sitting position.  She has had  a numb spot to the right lateral thigh or leg for a long time.  Her feet  hurt some but nothing like the wrists.  She has had to use a great deal of  Vicodin to help control the pain.  She is stiff in the mornings for about 30-  45 minutes.   REVIEW OF SYSTEMS:  She denies fever, significant rashes, or weight loss.  Her energy level is pretty good.  Her sleep is somewhat disturbed because of  the pain.  There has been no psoriasis, oral ulcers, alopecia, pleurisy, or  headaches.  She works with Dr. Karilyn Cota because of erosive reflux esophagitis.   PAST MEDICAL/SOCIAL HISTORY:  Erosive esophagitis and dysphagia.  She has  recently worked with Dr. Karilyn Cota concerning this.  She underwent an EGD with  dilatation.   LABORATORY DATA:  Labs checked on April 20, 2002 show a negative H. pylori.  I reviewed cervical neck films.  It shows severe degenerative arthritic  changes.   MEDICATIONS:  1. Aciphex 20 mg q.d.  2. Vicodin 5/500 mg t.i.d.  3. Calcium with vitamin D b.i.d.  4. Advil p.r.n.   DRUG INTOLERANCES:  WYGESIC   FAMILY HISTORY:  Her father is living but is ill with prostatic cancer.  Her  mother has osteoporosis and OA.   SOCIAL HISTORY:  [place]  She has been widowed for 10  years.  She is presently living with her parents to take care of them.  She  never smoked and does not drink alcohol.  She has two grown children.   PHYSICAL EXAMINATION:  VITAL SIGNS:  Weight 141 pounds, height 5 feet 2-1/4  inches, blood pressure 128/70, respirations 14.  GENERAL:  She is overall healthy appearing and appears to be her stated age.  SKIN:  Clear.  HEENT:  PERRL/EOMI.  Mouth clear.  NECK:  No adenopathy.  Normal thyroid.  LUNGS:  Clear.  HEART:  Regular, no murmur.  ABDOMEN:  Negative HSM, nontender.  MUSCULOSKELETAL:  The hands show minor nodularity to the DIP's.  The PIP's  are nonswollen.  The MCP's have fullness but are nontender.  The left  greater than right wrists has some mild swelling.  The left wrist is warm  and there is mild dorsal synovitis.  She guards with flexion at the left  wrist.  Elbows:  Good range of motion.  The shoulders move well but there is  minor discomfort.  Trigger points of the elbows, shoulders, neck, occiput,  anterior chest have moderate tenderness.  The low back had mild tenderness.  Hips:  Good range of motion.  The knees were cool and nontender.  The right  trochanteric bursa was moderately tender.  The left Achilles' tendon was  swollen and quite tender.  The right Achilles' and foot and ankle were  nonswollen, nontender.  NEUROLOGIC:  Strength is 5/5.  DTR's are 2+ throughout.  Negative SLR's.   ASSESSMENT AND PLAN:  1. Bilateral wrist swelling.  This is definitely more on the right.  The x-     rays appear more to suggest a degenerative type of arthritis and also the     neck shows degenerative  arthritis; however, this wrist swelling is     atypical for straight forward osteoarthritis.  I have elected to place     her on a small amount of prednisone to treat this.  She will take 30 mg     for three days, then 20 mg and 15 mg each for one week intervals, and     then lower to 10 mg a day.  She is cautioned that prednisone can increase     her appetite and be associated with weight gain.  She is advised to snack     on whole fruit to avoid weight gain.  She is also advised to continue     taking her calcium.  2. Achilles' tendonitis.  I have asked her to ice this area for 15 minutes     each day for one week.  She will decrease the frequency to about every     other day for 1-2 weeks.  I hope she will keep icing this over a period     of about three weeks.  We may have her go for some stretching exercises     on return.  3. Right trochanteric bursitis.  This is a quite tender area.  She will     stretch this area and we may consider an injection in the future.  4. Reflux esophagitis.  5. Hysterectomy.   Najeeb, it is unusual to have the type of wrist swelling that the patient is  presenting with.  Although less likely, gout is somewhat in the  differential.  Also, inflammatory arthritis is certainly positive.  We will  check a CBC, CMET, RF, ESR, ANA, and a uric acid.  I will be seeing her back  in four weeks.  Thank you.  Aundra Dubin, M.D.    WWT/MEDQ  D:  05/01/2002  T:  05/02/2002  Job:  [id number]   cc:   Lionel December, M.D.

## 2011-01-20 NOTE — Consult Note (Signed)
[name], [name]                         ACCOUNT NO.:  [id number]   MEDICAL RECORD NO.:  [id number]                   PATIENT TYPE:  AMB   LOCATION:  DAY                                  FACILITY:  [facility]   PHYSICIAN:  Aundra Dubin, M.D.            DATE OF BIRTH:  May 01, 1941   DATE OF CONSULTATION:  05/29/2002  DATE OF DISCHARGE:  04/18/2002                                   CONSULTATION   CHIEF COMPLAINT:  Hands, wrists, and swollen left Achilles tendon.   HISTORY OF PRESENT ILLNESS:  The patient returns after being on the  prednisone and is feeling a great deal better.  She says the achiness and  pain to the hands, wrists, and shoulders is gone.  She is not swollen to the  left Achilles, and she is able to walk and mow her grass.  Her weight is  down two pounds.  There is no back pain, polyuria, or polydipsia.  She has  had no rashes.  Her energy level is quite good.  She is sleeping well.  There have been no headaches, diarrhea, blood or mucous in the bowel  movement.   LABORATORY DATA:  From May 01, 2002, showed a rheumatoid factor of 172 (0-  20).  WBC was 6.9, hemoglobin 12.7, platelets 332.  Glucose 138, creatinine  0.7, AST 17, albumin 3.6.  Uric acid 3.6.  She had negative serologies for  H. pylori.   MEDICATIONS:  1. Prednisone 10 mg q.d.  2. Aciphex 20 mg q.d.  3. Vicodin, rare.  4. Calcium with vitamin D b.i.d.   PHYSICAL EXAMINATION:  VITAL SIGNS:  Weight 139 pounds.  Blood pressure  118/68, respirations 14.  GENERAL:  No distress.  LUNGS:  Clear.  HEART:  Regular.  No murmur.  NECK:  Negative JVD.  SKIN:  Clear.  MUSCULOSKELETAL:  The hands have bony enlargement to the PIPs.  They are  nontender.  The fullness to the MCPs is not evident today, and these joints  are cool and nontender.  The wrists have no swelling or warmth and are  nontender.  Elbows, shoulders good range of motion with no wincing.  Knees,  ankles, and feet all have a good  range of motion and show no arthritis.  The  left Achilles tendon is not swollen or tender today.   ASSESSMENT AND PLAN:  Polyarthritis:  She is showing really no swelling at  this time.  She does have the positive rheumatoid factor.  With the number  of joints that are involved and the slight swelling that I was seeing, this  could possibly be emerging rheumatoid arthritis.  I would not make that  diagnosis at this point and have to follow  her to see if it emerges or not.  The plan is to decrease the prednisone to  7.5 mg q.d. for one month, then 5 mg q.d.  She  has done very well with  controlling her weight.  She is taking the calcium.   She will return in two months.                                               Aundra Dubin, M.D.    WWT/MEDQ  D:  05/29/2002  T:  05/30/2002  Job:  [id number]   cc:   Lionel December, M.D.

## 2011-01-20 NOTE — Procedures (Signed)
[name], [name] NO.:  [id number]   MEDICAL RECORD NO.:  [id number]          PATIENT TYPE:  REC   LOCATION:                                FACILITY:  [facility]   PHYSICIAN:  Dani Gobble, MD       DATE OF BIRTH:  [birth year]   DATE OF PROCEDURE:  11/21/2006  DATE OF DISCHARGE:                                ECHOCARDIOGRAM   REFERRING PHYSICIAN:  Ladona Horns. Neijstrom, MD.   INDICATIONS:  Evaluate LV function post chemotherapy for breast cancer.   The technical quality of the study is adequate.   Aorta measures normally at 3.9 cm.   The left atrium measures normally at 3.5 cm.  The patient appeared to be  in sinus rhythm during the procedure.   The interventricular septum and posterior wall are within normal limits  in thickness.  There is mild basal septal hypertrophy without LVOT  obstruction.   The aortic valve is mildly thickened but trileaflet with normal leaflet  excursion.  Trivial aortic stenosis is appreciated.  No aortic  insufficiency is noted.  Doppler interrogation of the aortic valve is  within normal limits.   The mitral valve also appears minimally thickened without limitation of  leaflet excursion.  No mitral valve prolapse is noted.  Trivial mitral  regurgitation is noted.  Doppler interrogation of mitral valve is within  normal limits.   The pulmonic valve is incompletely visualized but appeared to be grossly  structurally normal.   Tricuspid valve also appears structurally normal with trace to mild  tricuspid regurgitation.   The left ventricle is normal in size with LVIDD measured at 4.2 cm LVISD  measured 3.2 cm.  Overall left ventricular systolic function is normal  and no regional wall motion abnormalities are noted.   The right atrium, right ventricle normal in size and right ventricular  systolic function is normal.   IMPRESSION:  1. Mild basal septal hypertrophy without left ventricular outflow      tract obstruction noted.  2. Mild aortic sclerosis without stenosis.  3. Trivial aortic insufficiency.  4. Trivial mitral regurgitation.  5. Trace to mild tricuspid regurgitation.  6. Normal left ventricular size and systolic function without regional      wall motion abnormality noted.  7. As compared to prior study in November 2007, previous suggestion in      one view only of anteroseptal hypokinesis is not appreciated on      this study.  Otherwise no significant change.           ______________________________  Dani Gobble, MD     AB/MEDQ  D:  11/21/2006  T:  11/21/2006  Job:  [id number]   cc:   Ladona Horns. Mariel Sleet, MD  Fax: [phone]   Dani Gobble, MD  Fax: [phone]

## 2011-01-20 NOTE — Consult Note (Signed)
NAMEBRINSLEY, WENCE                           ACCOUNT NO.:  [id number]   MEDICAL RECORD NO.:  [id number]                  PATIENT TYPE:   LOCATION:                                       FACILITY:   PHYSICIAN:  [id number]                                DATE OF BIRTH:  Dec 20, 1940   DATE OF CONSULTATION:  04/14/2002  DATE OF DISCHARGE:                                   CONSULTATION   REASON FOR CONSULTATION:  Dysphagia.   HISTORY OF PRESENT ILLNESS:  The patient is a pleasant 70 year old Caucasian  female patient of Dr. Leona Carry, who presents today for further evaluation  of dysphagia.  She complains of dysphagia for the past four years.  She has  trouble eating solids, especially meats, at times.  She can feel the food  get stuck and at times has had to vomit the food.  She denies any  odynophagia.  She rarely has heartburn symptoms as long as she takes her  Zantac.  She has been on this four to five years.  Denies any nausea,  abdominal pain, diarrhea, melena, or rectal bleeding.  She recently was  tried on Celebrex for arthritis but only took this for approximately one  week.  She was given ibuprofen 800 mg t.i.d. for the past one week as well.  Prior to that she had been on over-the-counter ibuprofen for arthritis.  Also, she has been on Aleve as needed for arthritis.   MEDICATIONS:  Zantac 75 mg b.i.d., calcium plus D 1000 mg b.i.d., ibuprofen  800 mg t.i.d.   ALLERGIES:  WYGESIC.   PAST MEDICAL HISTORY:  Arthritis, she also has severe degenerative disk  disease of the neck.   PAST SURGICAL HISTORY:  Partial hysterectomy in 1989.  D&C with uterine  polypectomy in 1987.   FAMILY HISTORY:  Her mother and family are both disabled.  No family history  of colorectal cancer or chronic GI illness.   SOCIAL HISTORY:  She is widowed.  She has two children.  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  Otherwise unemployed.  Never been a smoker.  Denies any  alcohol use.   REVIEW OF  SYMPTOMS:  Please see HPI for GI.  Also, she complains of  constipation but generally will have one bowel movement daily.  Denies any  weight loss.   PHYSICAL EXAMINATION:  VITAL SIGNS:  Weight 144, height 5 feet 2 inches.  Temperature 97.1, blood pressure 130/73, pulse 56.  GENERAL:  A very pleasant, well-nourished, well-developed Caucasian female  in no acute distress.  SKIN:  Warm and dry.  No jaundice.  HEENT:  Conjunctivae are pink.  Sclerae are not icteric.  Oropharyngeal  mucosa is moist and pink, no erythema, lesion, or exudate.  NECK:  No lymphadenopathy or thyromegaly or carotid bruits.  CHEST:  Lungs are clear to auscultation.  CARDIAC:  Regular rate and rhythm, normal S1, S2, no murmurs, rubs, or  gallops.  ABDOMEN:  Positive bowel sounds, soft, nontender, nondistended, no  organomegaly or masses.  EXTREMITIES:  No edema.   IMPRESSION:  The patient is a pleasant 70 year old Caucasian female with a  four-year history of solid food esophageal dysphagia.  She has had several  episodes of what sounds like food impaction but was able to reduce this  herself.  She also has typical heartburn symptoms if she does not take any  antacid.  She is on nonsteroidal anti-inflammatory drug use chronically for  arthritis.  Her differential diagnosis would esophageal web, ring, or  stricture.  She may have a complication of gastroesophageal reflux disease  or even peptic ulcer disease as well.  She complains of mild constipation as  well.   PLAN:  1. EGD with dilatation in the near future.  2. She will begin fiber _______ two tablets daily.  3. Colace 200 mg daily as needed.  4. Recommend colonoscopy at some point in the near future for colorectal     cancer screening.   I would like to thank Dr. Leona Carry for allowing [place] to take part in the care  of this patient.     Tana Coast, [place]                          [id number]    LL/MEDQ  D:  04/14/2002  T:  04/14/2002  Job:  [id number]   cc:    Bernerd Limbo. Leona Carry, M.D.

## 2011-01-25 ENCOUNTER — Encounter (HOSPITAL_COMMUNITY): Payer: Self-pay | Admitting: Oncology

## 2011-01-25 ENCOUNTER — Encounter (HOSPITAL_COMMUNITY): Payer: Medicare Other | Attending: Oncology | Admitting: Oncology

## 2011-01-25 ENCOUNTER — Ambulatory Visit (HOSPITAL_COMMUNITY): Payer: Self-pay | Admitting: Oncology

## 2011-01-25 DIAGNOSIS — C439 Malignant melanoma of skin, unspecified: Secondary | ICD-10-CM

## 2011-04-18 ENCOUNTER — Other Ambulatory Visit (HOSPITAL_COMMUNITY): Payer: Self-pay | Admitting: Internal Medicine

## 2011-04-18 ENCOUNTER — Ambulatory Visit (HOSPITAL_COMMUNITY)
Admission: RE | Admit: 2011-04-18 | Discharge: 2011-04-18 | Disposition: A | Discharge status: Home / Self Care | Payer: Medicare Other | Source: Ambulatory Visit | Attending: Internal Medicine | Admitting: Internal Medicine

## 2011-04-18 DIAGNOSIS — M549 Dorsalgia, unspecified: Secondary | ICD-10-CM

## 2011-04-18 DIAGNOSIS — M545 Low back pain, unspecified: Secondary | ICD-10-CM | POA: Insufficient documentation

## 2011-04-18 DIAGNOSIS — M546 Pain in thoracic spine: Secondary | ICD-10-CM | POA: Insufficient documentation

## 2011-06-05 LAB — MISCELLANEOUS TEST

## 2011-06-15 LAB — COMPREHENSIVE METABOLIC PANEL
AST: 22
Albumin: 3.8
Alkaline Phosphatase: 77
Chloride: 104
GFR calc Af Amer: >60
Potassium: 3.7
Sodium: 141
Total Bilirubin: 0.5
Total Protein: 6.5

## 2011-06-15 LAB — CBC
HCT: 44.5
Platelets: 266
RDW: 15.7 — ABNORMAL HIGH
WBC: 6.3

## 2011-10-11 ENCOUNTER — Other Ambulatory Visit (HOSPITAL_COMMUNITY): Payer: Self-pay | Admitting: Internal Medicine

## 2011-10-11 DIAGNOSIS — Z139 Encounter for screening, unspecified: Secondary | ICD-10-CM

## 2011-10-25 ENCOUNTER — Ambulatory Visit (HOSPITAL_COMMUNITY)
Admission: RE | Admit: 2011-10-25 | Discharge: 2011-10-25 | Disposition: A | Discharge status: Home / Self Care | Payer: Medicare Other | Source: Ambulatory Visit | Attending: Internal Medicine | Admitting: Internal Medicine

## 2011-10-25 DIAGNOSIS — Z853 Personal history of malignant neoplasm of breast: Secondary | ICD-10-CM | POA: Insufficient documentation

## 2011-10-25 DIAGNOSIS — Z139 Encounter for screening, unspecified: Secondary | ICD-10-CM

## 2011-10-25 DIAGNOSIS — Z9889 Other specified postprocedural states: Secondary | ICD-10-CM

## 2011-11-29 ENCOUNTER — Other Ambulatory Visit (HOSPITAL_COMMUNITY): Payer: Self-pay | Admitting: Internal Medicine

## 2011-11-29 DIAGNOSIS — Z139 Encounter for screening, unspecified: Secondary | ICD-10-CM

## 2011-12-06 ENCOUNTER — Other Ambulatory Visit (HOSPITAL_COMMUNITY): Payer: Medicare Other

## 2012-01-02 ENCOUNTER — Ambulatory Visit (HOSPITAL_COMMUNITY)
Admission: RE | Admit: 2012-01-02 | Discharge: 2012-01-02 | Disposition: A | Discharge status: Home / Self Care | Payer: Medicare Other | Source: Ambulatory Visit | Attending: Internal Medicine | Admitting: Internal Medicine

## 2012-01-02 DIAGNOSIS — M810 Age-related osteoporosis without current pathological fracture: Secondary | ICD-10-CM | POA: Insufficient documentation

## 2012-01-02 DIAGNOSIS — Z139 Encounter for screening, unspecified: Secondary | ICD-10-CM | POA: Insufficient documentation

## 2012-01-24 ENCOUNTER — Encounter (HOSPITAL_COMMUNITY): Payer: Medicare Other | Attending: Oncology | Admitting: Oncology

## 2012-01-24 ENCOUNTER — Encounter (HOSPITAL_COMMUNITY): Payer: Self-pay | Admitting: Oncology

## 2012-01-24 VITALS — BP 143/78 | HR 77 | Temp 98.2°F | Ht 61.5 in | Wt 138.9 lb

## 2012-01-24 DIAGNOSIS — C50419 Malignant neoplasm of upper-outer quadrant of unspecified female breast: Secondary | ICD-10-CM

## 2012-01-24 DIAGNOSIS — C50919 Malignant neoplasm of unspecified site of unspecified female breast: Secondary | ICD-10-CM

## 2012-01-24 DIAGNOSIS — M4120 Other idiopathic scoliosis, site unspecified: Secondary | ICD-10-CM

## 2012-01-24 DIAGNOSIS — G8929 Other chronic pain: Secondary | ICD-10-CM

## 2012-01-24 DIAGNOSIS — Z170 Estrogen receptor positive status [ER+]: Secondary | ICD-10-CM

## 2012-01-24 NOTE — Patient Instructions (Signed)
[name]  [id number] Nov 03, 1940 Dr. Glenford Peers   [facility]  Discharge Instructions  RECOMMENDATIONS MADE BY THE CONSULTANT AND ANY TEST RESULTS WILL BE SENT TO YOUR REFERRING DOCTOR.   EXAM FINDINGS BY MD TODAY AND SIGNS AND SYMPTOMS TO REPORT TO CLINIC OR PRIMARY MD: Exam and discussion per MD.  You are doing well.  No evidence of recurrence.  MEDICATIONS PRESCRIBED: none   INSTRUCTIONS GIVEN AND DISCUSSED: Other :  Report any new lumps, bone pain or shortness of breath.  SPECIAL INSTRUCTIONS/FOLLOW-UP: Return to Clinic in 1 year.   I acknowledge that I have been informed and understand all the instructions given to me and received a copy. I do not have any more questions at this time, but understand that I may call the Specialty Clinic at [facility] at [phone] during business hours should I have any further questions or need assistance in obtaining follow-up care.    __________________________________________  _____________  __________ Signature of Patient or Authorized Representative            Date                   Time    __________________________________________ Nurse's Signature

## 2012-01-24 NOTE — Progress Notes (Signed)
CC:   [name], M.D. Dalia Heading, M.D. Billie Lade, Ph.D., M.D.  DIAGNOSES: 1. Stage I (T1c N0 M0) infiltrating ductal carcinoma of the left     breast status post lumpectomy and sentinel node biopsy with 3     negative nodes.  Her cancer was ER positive 52%, PR positive 7%,     HER-2/neu 2+ by [facility] but 3+ positive by FISH.  Ki-67 marker was low     at 7%.  Interestingly, no lymphovascular invasion was seen and she     was felt to be grade 1.  She accepted only Herceptin for a year and     Aromasin therapy but we switched her to tamoxifen due to cost.  The     hormonal therapy started February 2007 and she completed 5 full     years of therapy as of last year.  She took a year of Herceptin.     She refused radiation therapy and she refused chemotherapy. 2. Rheumatoid arthritis treated by Dr. Gavin Potters. 3. Gastroesophageal reflux disease. 4. Right rotator cuff partial tear. 5. Left broken arm. 6. Right biceps head tear of one of the heads, not both. 7. Degenerative disk disease with scoliosis and chronic back pain and     left leg sciatica. 8. Wygesic-induced nausea and vomiting. 9. Benadryl-induced hyperactivity. 10.Vaginal hysterectomy in 1989 for benign disease.  HISTORY:  [name] is still taking care of both of her parents who are 61 and 37, somewhat demented, having more and more problems.  She hardly gets any sleep at night because 1 of them is either up and about or confused.  She tore her right biceps about 4 or 5 months ago and I think it was just a single head, not both heads.  She is not having any breathing problems.  Chronic back problems still remain.  Her joints are fairly stable on her therapy through Dr. Gavin Potters.  Bowel function is fine.  Her bone density recently did show some osteoporosis and osteopenia and she is still on her therapy.  She takes Fosamax weekly 70 mg and 2 calcium with vitamin D, and the vitamin D is at least 2000  units.  PHYSICAL EXAMINATION:  General:  She is alert.  She is still oriented. Her back still shows a scoliotic changes but she has clear lung fields. The right biceps tendon has been torn partially.  She has decreased range of motion of the right shoulder with some discomfort when she elevates it or tries to internally rotate it.  Breasts:  Shows fibrocystic changes to both sides but no masses.  Heart:  Regular rhythm and rate without murmur or gallop.  Abdomen:  Soft and nontender without hepatosplenomegaly.  She has no peripheral edema.  I think she is doing as well as we can hope.  She had blood work by Dr. Catalina Pizza, I will not need to repeat that.  We will see her back in a year, sooner if need be.    ______________________________ Ladona Horns. Mariel Sleet, MD ESN/MEDQ  D:  01/24/2012  T:  01/24/2012  Job:  [id number]

## 2012-01-24 NOTE — Progress Notes (Signed)
This office note has been dictated.

## 2012-11-20 ENCOUNTER — Other Ambulatory Visit (HOSPITAL_COMMUNITY): Payer: Self-pay | Admitting: Internal Medicine

## 2012-11-20 DIAGNOSIS — M549 Dorsalgia, unspecified: Secondary | ICD-10-CM

## 2012-11-21 ENCOUNTER — Other Ambulatory Visit (HOSPITAL_COMMUNITY): Payer: Self-pay

## 2012-11-21 DIAGNOSIS — R0602 Shortness of breath: Secondary | ICD-10-CM

## 2012-11-26 ENCOUNTER — Ambulatory Visit (HOSPITAL_COMMUNITY): Payer: Medicare Other

## 2012-11-26 ENCOUNTER — Ambulatory Visit (HOSPITAL_COMMUNITY)
Admission: RE | Admit: 2012-11-26 | Discharge: 2012-11-26 | Disposition: A | Discharge status: Home / Self Care | Payer: Medicare Other | Source: Ambulatory Visit | Attending: Internal Medicine | Admitting: Internal Medicine

## 2012-12-03 ENCOUNTER — Ambulatory Visit (HOSPITAL_COMMUNITY)
Admission: RE | Admit: 2012-12-03 | Discharge: 2012-12-03 | Disposition: A | Discharge status: Home / Self Care | Payer: Medicare Other | Source: Ambulatory Visit | Attending: Internal Medicine | Admitting: Internal Medicine

## 2012-12-03 DIAGNOSIS — R0602 Shortness of breath: Secondary | ICD-10-CM | POA: Insufficient documentation

## 2012-12-03 DIAGNOSIS — J449 Chronic obstructive pulmonary disease, unspecified: Secondary | ICD-10-CM | POA: Insufficient documentation

## 2012-12-03 DIAGNOSIS — J4489 Other specified chronic obstructive pulmonary disease: Secondary | ICD-10-CM | POA: Insufficient documentation

## 2012-12-03 MED ORDER — ALBUTEROL SULFATE (5 MG/ML) 0.5% IN NEBU
2.5000 mg | INHALATION_SOLUTION | Freq: Once | RESPIRATORY_TRACT | Status: AC
  Administered 2012-12-03: 2.5 mg via RESPIRATORY_TRACT

## 2012-12-04 ENCOUNTER — Ambulatory Visit (HOSPITAL_COMMUNITY)
Admission: RE | Admit: 2012-12-04 | Discharge: 2012-12-04 | Disposition: A | Discharge status: Home / Self Care | Payer: Medicare Other | Source: Ambulatory Visit | Attending: Internal Medicine | Admitting: Internal Medicine

## 2012-12-04 DIAGNOSIS — M549 Dorsalgia, unspecified: Secondary | ICD-10-CM

## 2012-12-04 DIAGNOSIS — M5124 Other intervertebral disc displacement, thoracic region: Secondary | ICD-10-CM | POA: Insufficient documentation

## 2012-12-04 DIAGNOSIS — M545 Low back pain, unspecified: Secondary | ICD-10-CM | POA: Insufficient documentation

## 2012-12-04 DIAGNOSIS — M546 Pain in thoracic spine: Secondary | ICD-10-CM | POA: Insufficient documentation

## 2012-12-04 DIAGNOSIS — M5126 Other intervertebral disc displacement, lumbar region: Secondary | ICD-10-CM | POA: Insufficient documentation

## 2012-12-05 NOTE — Procedures (Signed)
[name], PALL NO.:  [id number]  MEDICAL RECORD NO.:  [id number]  LOCATION:                                 FACILITY:  PHYSICIAN:  [name], M.D.DATE OF BIRTH:  Oct 13, 1940  DATE OF PROCEDURE:  12/04/2012 DATE OF DISCHARGE:                           PULMONARY FUNCTION TEST   REASON FOR PULMONARY FUNCTION TESTING:  Shortness of breath.  1. Spirometry shows no ventilatory defect with evidence of airflow     obstruction. 2. Lung volumes are normal. 3. DLCO is mildly reduced. 4. Airway resistance is slightly elevated confirming the presence of     airflow obstruction. 5. There is no significant bronchodilator improvement. 6. Pulmonary function test does show evidence of airflow obstruction     which is mild.     [name], M.D.     ELH/MEDQ  D:  12/04/2012  T:  12/05/2012  Job:  [id number]  cc:   Catalina Pizza, M.D. Fax: [phone]

## 2012-12-11 NOTE — Procedures (Signed)
[name], LEIS NO.:  [id number]  MEDICAL RECORD NO.:  [id number]  LOCATION:                                 FACILITY:  PHYSICIAN:  Lavonna Lampron L. Juanetta Gosling, M.D.DATE OF BIRTH:  1941/08/27  DATE OF PROCEDURE:  12/09/2012 DATE OF DISCHARGE:                           PULMONARY FUNCTION TEST   Reason for pulmonary function testing is shortness of breath. 1. Spirometry shows no ventilatory defect, but does show evidence of     airflow obstruction. 2. Lung volumes are normal. 3. DLCO is mildly reduced, but improved with ventilation is taken into     account. 4. Airway resistance is elevated confirming the presence of airflow     obstruction. 5. There is no significant bronchodilator improvement. 6. This study is consistent with airflow obstruction, COPD, asthma,     etc.  I apologized for the delay in report.  I received the     pulmonary function test on the day of interpretation.     Lannette Avellino L. Juanetta Gosling, M.D.     ELH/MEDQ  D:  12/09/2012  T:  12/10/2012  Job:  [id number]  cc:   Catalina Pizza, M.D. Fax: [phone]

## 2012-12-17 LAB — PULMONARY FUNCTION TEST

## 2013-01-22 ENCOUNTER — Ambulatory Visit (HOSPITAL_COMMUNITY): Payer: Medicare Other

## 2013-02-24 ENCOUNTER — Other Ambulatory Visit (HOSPITAL_COMMUNITY): Payer: Self-pay | Admitting: Oncology

## 2013-02-24 ENCOUNTER — Ambulatory Visit (HOSPITAL_COMMUNITY): Payer: Medicare Other

## 2013-02-24 DIAGNOSIS — Z853 Personal history of malignant neoplasm of breast: Secondary | ICD-10-CM

## 2013-02-26 ENCOUNTER — Ambulatory Visit (HOSPITAL_COMMUNITY)
Admission: RE | Admit: 2013-02-26 | Discharge: 2013-02-26 | Disposition: A | Discharge status: Home / Self Care | Payer: Medicare Other | Source: Ambulatory Visit | Attending: Oncology | Admitting: Oncology

## 2013-02-26 ENCOUNTER — Encounter (HOSPITAL_COMMUNITY): Payer: Self-pay | Admitting: Oncology

## 2013-02-26 ENCOUNTER — Encounter (HOSPITAL_COMMUNITY): Payer: Medicare Other | Attending: Oncology | Admitting: Oncology

## 2013-02-26 VITALS — BP 153/66 | HR 60 | Temp 98.1°F | Resp 16 | Wt 116.6 lb

## 2013-02-26 DIAGNOSIS — Z170 Estrogen receptor positive status [ER+]: Secondary | ICD-10-CM

## 2013-02-26 DIAGNOSIS — C50919 Malignant neoplasm of unspecified site of unspecified female breast: Secondary | ICD-10-CM

## 2013-02-26 DIAGNOSIS — Z853 Personal history of malignant neoplasm of breast: Secondary | ICD-10-CM | POA: Insufficient documentation

## 2013-02-26 NOTE — Progress Notes (Signed)
#  1 stage I (T1 C., N0, M0) infiltrating ductal carcinoma the left breast status post lumpectomy and sentinel lymph node biopsy with 3 negative lymph nodes. Her cancer was ER +52%, PR +7%, HER-2/neu 2+ by IHC but 3+ positive by FISH. Ki-67 work was low at 7%. No LV I was seen and this was felt to be grade 1. She accepted only Herceptin for one year and Aromasin therapy which she took initially followed by tamoxifen, switched do to cost. She took therapy for 5 years. She refused radiation therapy and chemotherapy. Thus far she has noticed recurrent disease. Her mammography is due today after our visit here.  #2 rheumatoid arthritis treated by Dr. Roselind Messier with methotrexate weekly and an anti-inflammatory. #3 chronic back pain secondary to scoliosis and DJD. She has been told she needs surgery by a physician at [facility] but she does not want to pursue that at this time since her numbness and pain in the left leg is typically intermittent. #4 left broken arm repair in the past #5 GERD better since she has lost weight #6 right biceps tendon head tear #7 vaginal hysterectomy 1989 for benign disease #8 depression since her father died in 01-31-2013 She has lost approximately 22 pounds. Her father started getting worse last fall and she started losing weight and he died of progression of dementia in may of this year.  Oncology review of systems however is negative. We spent some time talking about her issues with her parents. She still taking care of her mother full-time. She also wanted discuss her surgical back potential surgery.  Vital signs otherwise are stable. Lymph nodes are negative throughout. Both breasts are still lumpy bumpy but without obvious masses. Heart shows a regular rhythm and rate. Joints are the same as in the past. Lungs are clear though. She has no obvious thyromegaly. Skin exam is negative. Abdomen is soft and nontender without hepatosplenomegaly. She has no peripheral edema.  We'll see  her in one year but she is going to have her mammography today. I do not think we need to do blood work. We will try to get her blood results from Dr. Margo Aye, her primary care physician.

## 2013-02-26 NOTE — Patient Instructions (Addendum)
.  [facility] Discharge Instructions  RECOMMENDATIONS MADE BY THE CONSULTANT AND ANY TEST RESULTS WILL BE SENT TO YOUR REFERRING PHYSICIAN.  EXAM FINDINGS BY THE PHYSICIAN TODAY AND SIGNS OR SYMPTOMS TO REPORT TO CLINIC OR PRIMARY PHYSICIAN: exam per Dr. Mariel Sleet Report any new lumps Follow up with mammogram as scheduled   SPECIAL INSTRUCTIONS/FOLLOW-UP: We will see you in one year.  Thank you for choosing [facility] to provide your oncology and hematology care.  To afford each patient quality time with our providers, please arrive at least 15 minutes before your scheduled appointment time.  With your help, our goal is to use those 15 minutes to complete the necessary work-up to ensure our physicians have the information they need to help with your evaluation and healthcare recommendations.    Effective January 1st, 2014, we ask that you re-schedule your appointment with our physicians should you arrive 10 or more minutes late for your appointment.  We strive to give you quality time with our providers, and arriving late affects you and other patients whose appointments are after yours.    Again, thank you for choosing [facility].  Our hope is that these requests will decrease the amount of time that you wait before being seen by our physicians.       _____________________________________________________________  Should you have questions after your visit to [facility], please contact our office at [phone] between the hours of 8:30 a.m. and 5:00 p.m.  Voicemails left after 4:30 p.m. will not be returned until the following business day.  For prescription refill requests, have your pharmacy contact our office with your prescription refill request.

## 2013-03-14 ENCOUNTER — Ambulatory Visit (HOSPITAL_COMMUNITY): Payer: Medicare Other | Admitting: Oncology

## 2013-04-07 ENCOUNTER — Other Ambulatory Visit (HOSPITAL_COMMUNITY): Payer: Self-pay | Admitting: Internal Medicine

## 2013-04-07 DIAGNOSIS — R221 Localized swelling, mass and lump, neck: Secondary | ICD-10-CM

## 2013-04-08 ENCOUNTER — Ambulatory Visit (HOSPITAL_COMMUNITY): Payer: Medicare Other

## 2013-04-22 ENCOUNTER — Ambulatory Visit (HOSPITAL_COMMUNITY)
Admission: RE | Admit: 2013-04-22 | Discharge: 2013-04-22 | Disposition: A | Discharge status: Home / Self Care | Payer: Medicare Other | Source: Ambulatory Visit | Attending: Internal Medicine | Admitting: Internal Medicine

## 2013-04-22 DIAGNOSIS — R221 Localized swelling, mass and lump, neck: Secondary | ICD-10-CM

## 2013-04-22 DIAGNOSIS — E042 Nontoxic multinodular goiter: Secondary | ICD-10-CM | POA: Insufficient documentation

## 2013-04-29 ENCOUNTER — Other Ambulatory Visit (HOSPITAL_COMMUNITY): Payer: Self-pay | Admitting: Internal Medicine

## 2013-04-29 DIAGNOSIS — E041 Nontoxic single thyroid nodule: Secondary | ICD-10-CM

## 2013-05-06 ENCOUNTER — Ambulatory Visit (HOSPITAL_COMMUNITY)
Admission: RE | Admit: 2013-05-06 | Discharge: 2013-05-06 | Disposition: A | Discharge status: Home / Self Care | Payer: Medicare Other | Source: Ambulatory Visit | Attending: Internal Medicine | Admitting: Internal Medicine

## 2013-05-06 ENCOUNTER — Encounter (HOSPITAL_COMMUNITY): Payer: Self-pay

## 2013-05-06 VITALS — BP 142/70 | HR 58 | Temp 97.9°F | Resp 16

## 2013-05-06 DIAGNOSIS — E041 Nontoxic single thyroid nodule: Secondary | ICD-10-CM

## 2013-05-06 MED ORDER — LIDOCAINE HCL (PF) 2 % IJ SOLN
10.0000 mL | Freq: Once | INTRAMUSCULAR | Status: AC
  Administered 2013-05-06: 10 mL
  Filled 2013-05-06: qty 10

## 2013-05-06 MED ORDER — LIDOCAINE HCL (PF) 2 % IJ SOLN
INTRAMUSCULAR | Status: AC
  Administered 2013-05-06: 10 mL
  Filled 2013-05-06: qty 10

## 2013-05-06 NOTE — Procedures (Signed)
PreOperative Dx: LEFT thyroid nodule Postoperative Dx: LEFT thyroid nodule Procedure:   US guided FNA of LEFT thyroid nodule Radiologist:  Tyron Russell Anesthesia:  1 ml of 2 lidocaine Specimen:  FNA x 3 of LEFT thyroid nodule  EBL:   None Complications: None

## 2013-05-06 NOTE — Progress Notes (Addendum)
Biopsy of left side thyroid complete no signs of distress

## 2013-06-03 ENCOUNTER — Other Ambulatory Visit (INDEPENDENT_AMBULATORY_CARE_PROVIDER_SITE_OTHER): Payer: Self-pay | Admitting: Surgery

## 2013-06-03 ENCOUNTER — Other Ambulatory Visit (INDEPENDENT_AMBULATORY_CARE_PROVIDER_SITE_OTHER): Payer: Self-pay

## 2013-06-03 ENCOUNTER — Encounter (INDEPENDENT_AMBULATORY_CARE_PROVIDER_SITE_OTHER): Payer: Self-pay | Admitting: Surgery

## 2013-06-03 ENCOUNTER — Ambulatory Visit (INDEPENDENT_AMBULATORY_CARE_PROVIDER_SITE_OTHER): Payer: Medicare Other | Admitting: Surgery

## 2013-06-03 VITALS — BP 140/66 | HR 61 | Temp 96.6°F | Ht 58.5 in | Wt 118.2 lb

## 2013-06-03 DIAGNOSIS — D449 Neoplasm of uncertain behavior of unspecified endocrine gland: Secondary | ICD-10-CM

## 2013-06-03 DIAGNOSIS — E042 Nontoxic multinodular goiter: Secondary | ICD-10-CM | POA: Insufficient documentation

## 2013-06-03 DIAGNOSIS — D440 Neoplasm of uncertain behavior of thyroid gland: Secondary | ICD-10-CM

## 2013-06-03 NOTE — Addendum Note (Signed)
Addended by: Joanette Gula on: 06/03/2013 02:58 PM   Modules accepted: Orders

## 2013-06-03 NOTE — Patient Instructions (Addendum)
Thyroid Diseases  Your thyroid is a butterfly-shaped gland in your neck. It is located just above your collarbone. It is one of your endocrine glands, which make hormones. The thyroid helps set your metabolism. Metabolism is how your body gets energy from the foods you eat.   Millions of people have thyroid diseases. Women experience thyroid problems more often than men. In fact, overactive thyroid problems (hyperthyroidism) occur in 1% of all women. If you have a thyroid disease, your body may use energy more slowly or quickly than it should.   Thyroid problems also include an immune disease where your body reacts against your thyroid gland (called thyroiditis). A different problem involves lumps and bumps (called nodules) that develop in the gland. The nodules are usually, but not always, noncancerous.  THE MOST COMMON THYROID PROBLEMS AND CAUSES ARE DISCUSSED BELOW  There are many causes for thyroid problems. Treatment depends upon the exact diagnosis and includes trying to reset your body's metabolism to a normal rate.  Hyperthyroidism  Too much thyroid hormone from an overactive thyroid gland is called hyperthyroidism. In hyperthyroidism, the body's metabolism speeds up. One of the most frequent forms of hyperthyroidism is known as Graves' disease. Graves' disease tends to run in families. Although Graves' is thought to be caused by a problem with the immune system, the exact nature of the genetic problem is unknown.  Hypothyroidism  Too little thyroid hormone from an underactive thyroid gland is called hypothyroidism. In hypothyroidism, the body's metabolism is slowed. Several things can cause this condition. Most causes affect the thyroid gland directly and hurt its ability to make enough hormone.   Rarely, there may be a pituitary gland tumor (located near the base of the brain). The tumor can block the pituitary from producing thyroid-stimulating hormone (TSH). Your body makes TSH to stimulate the thyroid  to work properly. If the pituitary does not make enough TSH, the thyroid fails to make enough hormones needed for good health.  Whether the problem is caused by thyroid conditions or by the pituitary gland, the result is that the thyroid is not making enough hormones. Hypothyroidism causes many physical and mental processes to become sluggish. The body consumes less oxygen and produces less body heat.  Thyroid Nodules  A thyroid nodule is a small swelling or lump in the thyroid gland. They are common. These nodules represent either a growth of thyroid tissue or a fluid-filled cyst. Both form a lump in the thyroid gland. Almost half of all people will have tiny thyroid nodules at some point in their lives. Typically, these are not noticeable until they become large and affect normal thyroid size. Larger nodules that are greater than a half inch across (about 1 centimeter) occur in about 5 percent of people.  Although most nodules are not cancerous, people who have them should seek medical care to rule out cancer. Also, some thyroid nodules may produce too much thyroid hormone or become too large. Large nodules or a large gland can interfere with breathing or swallowing or may cause neck discomfort.  Other problems  Other thyroid problems include cancer and thyroiditis. Thyroiditis is a malfunction of the body's immune system. Normally, the immune system works to defend the body against infection and other problems. When the immune system is not working properly, it may mistakenly attack normal cells, tissues, and organs. Examples of autoimmune diseases are Hashimoto's thyroiditis (which causes low thyroid function) and Graves' disease (which causes excess thyroid function).  SYMPTOMS   Symptoms   vary greatly depending upon the exact type of problem with the thyroid.  Hyperthyroidism-is when your thyroid is too active and makes more thyroid hormone than your body needs. The most common cause is Graves' Disease. Too  much thyroid hormone can cause some or all of the following symptoms:  · Anxiety.  · Irritability.  · Difficulty sleeping.  · Fatigue.  · A rapid or irregular heartbeat.  · A fine tremor of your hands or fingers.  · An increase in perspiration.  · Sensitivity to heat.  · Weight loss, despite normal food intake.  · Brittle hair.  · Enlargement of your thyroid gland (goiter).  · Light menstrual periods.  · Frequent bowel movements.  Graves' disease can specifically cause eye and skin problems. The skin problems involve reddening and swelling of the skin, often on your shins and on the top of your feet. Eye problems can include the following:  · Excess tearing and sensation of grit or sand in either or both eyes.  · Reddened or inflamed eyes.  · Widening of the space between your eyelids.  · Swelling of the lids and tissues around the eyes.  · Light sensitivity.  · Ulcers on the cornea.  · Double vision.  · Limited eye movements.  · Blurred or reduced vision.  Hypothyroidism- is when your thyroid gland is not active enough. This is more common than hyperthyroidism. Symptoms can vary a lot depending of the severity of the hormone deficiency. Symptoms may develop over a long period of time and can include several of the following:  · Fatigue.  · Sluggishness.  · Increased sensitivity to cold.  · Constipation.  · Pale, dry skin.  · A puffy face.  · Hoarse voice.  · High blood cholesterol level.  · Unexplained weight gain.  · Muscle aches, tenderness and stiffness.  · Pain, stiffness or swelling in your joints.  · Muscle weakness.  · Heavier than normal menstrual periods.  · Brittle fingernails and hair.  · Depression.  Thyroid Nodules - most do not cause signs or symptoms. Occasionally, some may become so large that you can feel or even see the swelling at the base of your neck. You may realize a lump or swelling is there when you are shaving or putting on makeup. Men might become aware of a nodule when shirt collars  suddenly feel too tight.  Some nodules produce too much thyroid hormone. This can produce the same symptoms as hyperthyroidism (see above).  Thyroid nodules are seldom cancerous. However, a nodule is more likely to be malignant (cancerous) if it:  · Grows quickly or feels hard.  · Causes you to become hoarse or to have trouble swallowing or breathing.  · Causes enlarged lymph nodes under your jaw or in your neck.  DIAGNOSIS   Because there are so many possible thyroid conditions, your caregiver may ask for a number of tests. They will do this in order to narrow down the exact diagnosis. These tests can include:  · Blood and antibody tests.  · Special thyroid scans using small, safe amounts of radioactive iodine.  · Ultrasound of the thyroid gland (particularly if there is a nodule or lump).  · Biopsy. This is usually done with a special needle. A needle biopsy is a procedure to obtain a sample of cells from the thyroid. The tissue will be tested in a lab and examined under a microscope.  TREATMENT   Treatment depends on the exact diagnosis.    Hyperthyroidism  · Beta-blockers help relieve many of the symptoms.  · Anti-thyroid medications prevent the thyroid from making excess hormones.  · Radioactive iodine treatment can destroy overactive thyroid cells. The iodine can permanently decrease the amount of hormone produced.  · Surgery to remove the thyroid gland.  · Treatments for eye problems that come from Graves' disease also include medications and special eye surgery, if felt to be appropriate.  Hypothyroidism  Thyroid replacement with levothyroxine is the mainstay of treatment. Treatment with thyroid replacement is usually lifelong and will require monitoring and adjustment from time to time.  Thyroid Nodules  · Watchful waiting. If a small nodule causes no symptoms or signs of cancer on biopsy, then no treatment may be chosen at first. Re-exam and re-checking blood tests would be the recommended  follow-up.  · Anti-thyroid medications or radioactive iodine treatment may be recommended if the nodules produce too much thyroid hormone (see Treatment for Hyperthyroidism above).  · Alcohol ablation. Injections of small amounts of ethyl alcohol (ethanol) can cause a non-cancerous nodule to shrink in size.  · Surgery (see Treatment for Hyperthyroidism above).  HOME CARE INSTRUCTIONS   · Take medications as instructed.  · Follow through on recommended testing.  SEEK MEDICAL CARE IF:   · You feel that you are developing symptoms of Hyperthyroidism or Hypothyroidism as described above.  · You develop a new lump/nodule in the neck/thyroid area that you had not noticed before.  · You feel that you are having side effects from medicines prescribed.  · You develop trouble breathing or swallowing.  SEEK IMMEDIATE MEDICAL CARE IF:   · You develop a fever of 102° F (38.9° C) or higher.  · You develop severe sweating.  · You develop palpitations and/or rapid heart beat.  · You develop shortness of breath.  · You develop nausea and vomiting.  · You develop extreme shakiness.  · You develop agitation.  · You develop lightheadedness or have a fainting episode.  Document Released: 06/18/2007 Document Revised: 11/13/2011 Document Reviewed: 06/18/2007  Exit[employer]® Patient Information ©2014 [employer].

## 2013-06-03 NOTE — Progress Notes (Signed)
General Surgery [facility]  Chief Complaint  Patient presents with  . New Evaluation    evaluate thyroid nodules - referral from Dr. Dwana Melena, [place], [place]    HISTORY: Patient is a 72 year old female referred by her primary care physician for evaluation of bilateral thyroid nodules and [place] hyperthyroidism. Patient was noted to have abnormal thyroid function test. A recent TSH level was low at 0.074. However her total T4 level was normal at 1.1. She is not on thyroid hormone and has never taken thyroid hormone.  Patient subsequently underwent thyroid ultrasound in August 2014. This showed a 1.4 cm nodule in the left thyroid lobe with microcalcifications. 2 smaller nodules were noted in the right thyroid lobe. Fine needle aspiration was performed on the left thyroid lobe nodule. A limited sample was obtained but it did show benign follicular epithelial cells. There was no atypia and no evidence of malignancy.  There is no family history of thyroid disease and specifically no history of thyroid cancer. There is no family history of other endocrine neoplasm. Patient has had no prior head or neck surgery.  Past Medical History  Diagnosis Date  . Rheumatoid arteritis   . Breast cancer   . Scoliosis   . Bulging disc   . Arm fracture, left   . Rotator cuff disorder, right   . GERD (gastroesophageal reflux disease)   . Hypertension   . Osteoporosis     Current Outpatient Prescriptions  Medication Sig Dispense Refill  . alendronate (FOSAMAX) 70 MG tablet Take 70 mg by mouth every 7 (seven) days. Take with a full glass of water on an empty stomach.      . Calcium Carbonate-Vitamin D (CALCIUM + D PO) Take by mouth 2 (two) times daily. Has 1000 units      . CELEBREX 200 MG capsule Take 200 mg by mouth daily.      . clotrimazole-betamethasone (LOTRISONE) cream Apply topically as needed.      . folic acid (FOLVITE) 800 MCG tablet Take 800 mcg by mouth daily.      Marland Kitchen  lisinopril (PRINIVIL,ZESTRIL) 10 MG tablet       . methotrexate 2.5 MG tablet Take by mouth once a week. Takes 10 tablets each Mondays (25mg )      . nebivolol (BYSTOLIC) 5 MG tablet Take 5 mg by mouth daily.      Marland Kitchen omeprazole-sodium bicarbonate (ZEGERID) 40-1100 MG per capsule Take 1 capsule by mouth daily before breakfast.      . Sennosides-Docusate Sodium (STOOL SOFTENER & LAXATIVE PO) Take 1 tablet by mouth 2 (two) times daily.      . vitamin C (ASCORBIC ACID) 500 MG tablet Take 500 mg by mouth daily.      . Zinc 50 MG CAPS Take by mouth daily.      Marland Kitchen dexamethasone 0.5 MG/5ML elixir       . HYDROcodone-acetaminophen (NORCO/VICODIN) 5-325 MG per tablet Take 1 tablet by mouth every 6 (six) hours as needed for pain.       No current facility-administered medications for this visit.    Allergies  Allergen Reactions  . Diphenhydramine Hcl   . Wygesic [Propoxyphene-Acetaminophen] Nausea And Vomiting    Family History  Problem Relation Age of Onset  . Hypertension Mother   . Cancer Maternal Grandmother   . Cancer Cousin     History   Social History  . Marital Status: Widowed    Spouse Name: N/A  Number of Children: N/A  . Years of Education: N/A   Social History Main Topics  . Smoking status: Never Smoker   . Smokeless tobacco: Never Used  . Alcohol Use: No  . Drug Use: No  . Sexual Activity: None   Other Topics Concern  . None   Social History Narrative  . None    REVIEW OF SYSTEMS - PERTINENT POSITIVES ONLY: Denies palpitations. Occasional tremors. Recent 25 pound weight loss due to diet and stress.  EXAM: Filed Vitals:   06/03/13 1425  BP: 140/66  Pulse: 61  Temp: 96.6 F (35.9 C)    HEENT: normocephalic; pupils equal and reactive; sclerae clear; dentition good; mucous membranes moist NECK:  Palpable nodule mid left thyroid lobe, approximately 1 cm, smooth, mobile, nontender; right thyroid lobe with slight nodularity without discrete or dominant mass;  symmetric on extension; no palpable anterior or posterior cervical lymphadenopathy; no supraclavicular masses; no tenderness CHEST: clear to auscultation bilaterally without rales, rhonchi, or wheezes CARDIAC: regular rate and rhythm without significant murmur; peripheral pulses are full EXT:  non-tender without edema; no deformity NEURO: no gross focal deficits; no sign of tremor   LABORATORY RESULTS: See [facility] (CHL-Epic) for most recent results  RADIOLOGY RESULTS: See [facility] (CHL-Epic) for most recent results  IMPRESSION: #1 bilateral thyroid nodules, dominant nodule left lobe, 1.4 cm #2 borderline hyperthyroidism  PLAN: I discussed with the patient and her daughter the above findings. We reviewed her ultrasound results, her cytopathology results, and her laboratory studies. At this point there is no absolute indication for thyroidectomy.  I have recommended that the patient return in 6 months for short interval follow-up. I would like to repeat her thyroid ultrasound prior to that office visit. We will also obtain a TSH level, a total T4 level, and a total T3 level prior to that office visit.  Patient and her daughter understand and agree with this plan. I will see her back in 6 months. I provided him with written literature to review regarding thyroid nodules and hyperthyroidism.  Velora Heckler, MD, FACS General & Endocrine Surgery [facility]  Primary Care Physician: Catalina Pizza, MD

## 2013-11-11 ENCOUNTER — Other Ambulatory Visit: Payer: Medicare Other

## 2013-11-11 ENCOUNTER — Ambulatory Visit (HOSPITAL_COMMUNITY)
Admission: RE | Admit: 2013-11-11 | Discharge: 2013-11-11 | Disposition: A | Discharge status: Home / Self Care | Payer: Medicare Other | Source: Ambulatory Visit | Attending: Surgery | Admitting: Surgery

## 2013-11-11 DIAGNOSIS — E042 Nontoxic multinodular goiter: Secondary | ICD-10-CM | POA: Insufficient documentation

## 2013-11-11 DIAGNOSIS — D440 Neoplasm of uncertain behavior of thyroid gland: Secondary | ICD-10-CM

## 2013-11-18 ENCOUNTER — Encounter (INDEPENDENT_AMBULATORY_CARE_PROVIDER_SITE_OTHER): Payer: Self-pay | Admitting: Surgery

## 2013-11-18 ENCOUNTER — Ambulatory Visit (INDEPENDENT_AMBULATORY_CARE_PROVIDER_SITE_OTHER): Payer: Medicare Other | Admitting: Surgery

## 2013-11-18 VITALS — BP 140/76 | HR 70 | Temp 98.2°F | Ht 58.5 in | Wt 123.6 lb

## 2013-11-18 DIAGNOSIS — D440 Neoplasm of uncertain behavior of thyroid gland: Secondary | ICD-10-CM

## 2013-11-18 DIAGNOSIS — E042 Nontoxic multinodular goiter: Secondary | ICD-10-CM

## 2013-11-18 DIAGNOSIS — D449 Neoplasm of uncertain behavior of unspecified endocrine gland: Secondary | ICD-10-CM

## 2013-11-18 NOTE — Progress Notes (Signed)
General Surgery [facility]  Chief Complaint  Patient presents with  . Follow-up    bilateral thyroid nodules, borderline hyperthyroidism    HISTORY: Patient is a 73 year old female followed for bilateral thyroid nodules. At my request she underwent a short interval followup ultrasound examination. This demonstrated a normal sized thyroid gland with the right lobe measuring 4.1 cm in left lobe measuring 3.9 cm. Bilateral thyroid nodules were noted with the largest on the right measuring 1.0 cm in the largest on the left measuring 1.4 cm. Findings were suggestive of multinodular goiter. Nodules were unchanged from her prior study of August 2014.  PERTINENT REVIEW OF SYSTEMS: Denies compressive symptoms. Denies tremor. Denies palpitations. Patient states 30 pound weight loss followed by 7 pound weight gain.  EXAM: HEENT: normocephalic; pupils equal and reactive; sclerae clear; dentition good; mucous membranes moist NECK:  Mildly nodular and slightly firm thyroid gland without discrete or dominant mass palpable; symmetric on extension; no palpable anterior or posterior cervical lymphadenopathy; no supraclavicular masses; no tenderness CHEST: clear to auscultation bilaterally without rales, rhonchi, or wheezes CARDIAC: regular rate and rhythm without significant murmur; peripheral pulses are full EXT:  non-tender without edema; no deformity NEURO: no gross focal deficits; no sign of tremor   IMPRESSION: #1 bilateral thyroid nodules, clinically stable #2 underlying hyperthyroidism  PLAN: I discussed the above findings at length with the patient and her daughter. The ultrasound results are encouraging and there is no indication for biopsy or surgical intervention at this point in time.  I am going to check a TSH level, total T4 level, and total T3 level at this time. We will contact the patient with the results.  At this point, I plan to see the patient back in one year  with repeat thyroid ultrasound and repeat TSH level prior to that office visit.  Earnstine Regal, MD, [facility] Office: [phone]  Visit Diagnoses: 1. Multiple thyroid nodules   2. Neoplasm of uncertain behavior of thyroid gland, left lobe

## 2013-11-18 NOTE — Patient Instructions (Signed)

## 2013-11-21 LAB — T4: T4 TOTAL: 11.7 ug/dL (ref 5.0–12.5)

## 2013-11-21 LAB — T3: T3 TOTAL: 160.4 ng/dL (ref 80.0–204.0)

## 2013-11-21 LAB — TSH: TSH: 1.467 u[IU]/mL (ref 0.350–4.500)

## 2013-11-27 ENCOUNTER — Other Ambulatory Visit (INDEPENDENT_AMBULATORY_CARE_PROVIDER_SITE_OTHER): Payer: Self-pay

## 2013-11-27 ENCOUNTER — Telehealth (INDEPENDENT_AMBULATORY_CARE_PROVIDER_SITE_OTHER): Payer: Self-pay

## 2013-11-27 DIAGNOSIS — E042 Nontoxic multinodular goiter: Secondary | ICD-10-CM

## 2013-11-27 NOTE — Telephone Encounter (Signed)
Pt advised of attached lab result and need to f/u one year with TSH per Dr Gala Lewandowsky request. Pt agrees. Lab slip mailed to pt for labs around March first 2016.

## 2013-11-27 NOTE — Progress Notes (Signed)
Quick Note:  [name] - please notify patient that thyroid function tests are all within normal limits. Let's repeat a TSH level in one year.  Earnstine Regal, MD, [facility] Office: [phone]   ______

## 2013-11-27 NOTE — Telephone Encounter (Signed)
Message copied by Dois Davenport on Thu Nov 27, 2013 11:33 AM ------      Message from: Earnstine Regal      Created: Thu Nov 27, 2013 10:49 AM       Jenny Reichmann - please notify patient that thyroid function tests are all within normal limits.  Let's repeat a TSH level in one year.            Earnstine Regal, MD, [facility]      Office: [phone]             ------

## 2014-02-24 ENCOUNTER — Encounter (HOSPITAL_COMMUNITY): Payer: Self-pay | Admitting: Oncology

## 2014-02-24 DIAGNOSIS — C50912 Malignant neoplasm of unspecified site of left female breast: Secondary | ICD-10-CM

## 2014-02-24 HISTORY — DX: Malignant neoplasm of unspecified site of left female breast: C50.912

## 2014-02-24 NOTE — Progress Notes (Signed)
-  No show-  KEFALAS,THOMAS  

## 2014-02-26 ENCOUNTER — Ambulatory Visit (HOSPITAL_COMMUNITY): Payer: Medicare Other | Admitting: Oncology

## 2014-03-17 ENCOUNTER — Other Ambulatory Visit (HOSPITAL_COMMUNITY): Payer: Self-pay | Admitting: Internal Medicine

## 2014-03-17 DIAGNOSIS — Z1231 Encounter for screening mammogram for malignant neoplasm of breast: Secondary | ICD-10-CM

## 2014-03-19 ENCOUNTER — Ambulatory Visit (HOSPITAL_COMMUNITY)
Admission: RE | Admit: 2014-03-19 | Discharge: 2014-03-19 | Disposition: A | Discharge status: Home / Self Care | Payer: Medicare Other | Source: Ambulatory Visit | Attending: Internal Medicine | Admitting: Internal Medicine

## 2014-03-19 DIAGNOSIS — Z1231 Encounter for screening mammogram for malignant neoplasm of breast: Secondary | ICD-10-CM | POA: Insufficient documentation

## 2014-09-11 ENCOUNTER — Other Ambulatory Visit (HOSPITAL_COMMUNITY): Payer: Self-pay | Admitting: Respiratory Therapy

## 2014-09-11 DIAGNOSIS — R0602 Shortness of breath: Secondary | ICD-10-CM

## 2014-09-15 ENCOUNTER — Ambulatory Visit (HOSPITAL_COMMUNITY)
Admission: RE | Admit: 2014-09-15 | Discharge: 2014-09-15 | Disposition: A | Discharge status: Home / Self Care | Payer: PPO | Source: Ambulatory Visit | Attending: Internal Medicine | Admitting: Internal Medicine

## 2014-09-15 DIAGNOSIS — R0602 Shortness of breath: Secondary | ICD-10-CM | POA: Diagnosis not present

## 2014-09-15 MED ORDER — ALBUTEROL SULFATE (2.5 MG/3ML) 0.083% IN NEBU
2.5000 mg | INHALATION_SOLUTION | Freq: Once | RESPIRATORY_TRACT | Status: AC
  Administered 2014-09-15: 2.5 mg via RESPIRATORY_TRACT

## 2014-09-20 LAB — PULMONARY FUNCTION TEST
DL/VA % PRED: 120 %
DL/VA: 4.72 ml/min/mmHg/L
DLCO cor % pred: 104 %
DLCO cor: 16.83 ml/min/mmHg
DLCO unc % pred: 104 %
DLCO unc: 16.83 ml/min/mmHg
FEF 25-75 POST: 1.06 L/s
FEF 25-75 PRE: 0.67 L/s
FEF2575-%CHANGE-POST: 58 %
FEF2575-%PRED-POST: 81 %
FEF2575-%PRED-PRE: 51 %
FEV1-%CHANGE-POST: 18 %
FEV1-%PRED-POST: 101 %
FEV1-%Pred-Pre: 85 %
FEV1-POST: 1.58 L
FEV1-Pre: 1.34 L
FEV1FVC-%CHANGE-POST: 8 %
FEV1FVC-%Pred-Pre: 81 %
FEV6-%Change-Post: 12 %
FEV6-%PRED-PRE: 105 %
FEV6-%Pred-Post: 118 %
FEV6-PRE: 2.09 L
FEV6-Post: 2.35 L
FEV6FVC-%Change-Post: 2 %
FEV6FVC-%PRED-POST: 105 %
FEV6FVC-%Pred-Pre: 102 %
FVC-%Change-Post: 9 %
FVC-%Pred-Post: 113 %
FVC-%Pred-Pre: 103 %
FVC-PRE: 2.18 L
FVC-Post: 2.38 L
POST FEV6/FVC RATIO: 99 %
PRE FEV6/FVC RATIO: 96 %
Post FEV1/FVC ratio: 66 %
Pre FEV1/FVC ratio: 61 %

## 2014-09-22 ENCOUNTER — Other Ambulatory Visit (HOSPITAL_COMMUNITY): Payer: Self-pay | Admitting: Podiatry

## 2014-09-22 DIAGNOSIS — I739 Peripheral vascular disease, unspecified: Secondary | ICD-10-CM

## 2014-09-25 ENCOUNTER — Ambulatory Visit (HOSPITAL_COMMUNITY): Admission: RE | Admit: 2014-09-25 | Payer: PPO | Source: Ambulatory Visit

## 2014-09-29 ENCOUNTER — Other Ambulatory Visit (INDEPENDENT_AMBULATORY_CARE_PROVIDER_SITE_OTHER): Payer: Self-pay | Admitting: Surgery

## 2014-09-30 ENCOUNTER — Other Ambulatory Visit (INDEPENDENT_AMBULATORY_CARE_PROVIDER_SITE_OTHER): Payer: Self-pay

## 2014-09-30 ENCOUNTER — Other Ambulatory Visit (INDEPENDENT_AMBULATORY_CARE_PROVIDER_SITE_OTHER): Payer: Self-pay | Admitting: Surgery

## 2014-09-30 DIAGNOSIS — E042 Nontoxic multinodular goiter: Secondary | ICD-10-CM

## 2014-10-01 ENCOUNTER — Ambulatory Visit (HOSPITAL_COMMUNITY): Admission: RE | Admit: 2014-10-01 | Payer: PPO | Source: Ambulatory Visit

## 2014-10-01 ENCOUNTER — Ambulatory Visit (HOSPITAL_COMMUNITY)
Admission: RE | Admit: 2014-10-01 | Discharge: 2014-10-01 | Disposition: A | Discharge status: Home / Self Care | Payer: PPO | Source: Ambulatory Visit | Attending: Podiatry | Admitting: Podiatry

## 2014-10-01 DIAGNOSIS — I779 Disorder of arteries and arterioles, unspecified: Secondary | ICD-10-CM | POA: Diagnosis not present

## 2014-10-01 DIAGNOSIS — I739 Peripheral vascular disease, unspecified: Secondary | ICD-10-CM | POA: Diagnosis present

## 2014-10-07 ENCOUNTER — Other Ambulatory Visit (INDEPENDENT_AMBULATORY_CARE_PROVIDER_SITE_OTHER): Payer: Self-pay | Admitting: Surgery

## 2014-10-07 ENCOUNTER — Other Ambulatory Visit: Payer: PPO

## 2014-10-07 ENCOUNTER — Other Ambulatory Visit (INDEPENDENT_AMBULATORY_CARE_PROVIDER_SITE_OTHER): Payer: Self-pay | Admitting: *Deleted

## 2014-10-07 DIAGNOSIS — E042 Nontoxic multinodular goiter: Secondary | ICD-10-CM

## 2014-10-08 ENCOUNTER — Ambulatory Visit (HOSPITAL_COMMUNITY): Admission: RE | Admit: 2014-10-08 | Payer: PPO | Source: Ambulatory Visit

## 2014-10-08 ENCOUNTER — Other Ambulatory Visit: Payer: Self-pay | Admitting: Surgery

## 2014-10-08 ENCOUNTER — Ambulatory Visit (HOSPITAL_COMMUNITY)
Admission: RE | Admit: 2014-10-08 | Discharge: 2014-10-08 | Disposition: A | Discharge status: Home / Self Care | Payer: PPO | Source: Ambulatory Visit | Attending: Surgery | Admitting: Surgery

## 2014-10-08 DIAGNOSIS — E042 Nontoxic multinodular goiter: Secondary | ICD-10-CM | POA: Insufficient documentation

## 2014-10-16 ENCOUNTER — Other Ambulatory Visit: Payer: Self-pay | Admitting: Podiatry

## 2014-10-22 NOTE — Patient Instructions (Signed)
[name]  10/22/2014   Your procedure is scheduled on:  10/28/2014  Report to [facility] at 7:00 AM.  Call this number if you have problems the morning of surgery: [phone]   Remember:   Do not eat food or drink liquids after midnight.   Take these medicines the morning of surgery with A SIP OF WATER: fosamax, Zyrtec, Hydrocodone, Lisinopril, Zegrid, Methotrexate   Do not wear jewelry, make-up or nail polish.  Do not wear lotions, powders, or perfumes. You may wear deodorant.  Do not shave 48 hours prior to surgery. Men may shave face and neck.  Do not bring valuables to the hospital.  [facility] is not responsible for any belongings or valuables.               Contacts, dentures or bridgework may not be worn into surgery.  Leave suitcase in the car. After surgery it may be brought to your room.  For patients admitted to the hospital, discharge time is determined by your treatment team.               Patients discharged the day of surgery will not be allowed to drive home.  Name and phone number of your driver:   Special Instructions: Shower using CHG 2 nights before surgery and the night before surgery.  If you shower the day of surgery use CHG.  Use special wash - you have one bottle of CHG for all showers.  You should use approximately 1/3 of the bottle for each shower.   Please read over the following fact sheets that you were given: Surgical Site Infection Prevention and Anesthesia Post-op Instructions   PATIENT INSTRUCTIONS POST-ANESTHESIA  IMMEDIATELY FOLLOWING SURGERY:  Do not drive or operate machinery for the first twenty four hours after surgery.  Do not make any important decisions for twenty four hours after surgery or while taking narcotic pain medications or sedatives.  If you develop intractable nausea and vomiting or a severe headache please notify your doctor immediately.  FOLLOW-UP:  Please make an appointment with your surgeon as instructed. You do not  need to follow up with anesthesia unless specifically instructed to do so.  WOUND CARE INSTRUCTIONS (if applicable):  Keep a dry clean dressing on the anesthesia/puncture wound site if there is drainage.  Once the wound has quit draining you may leave it open to air.  Generally you should leave the bandage intact for twenty four hours unless there is drainage.  If the epidural site drains for more than 36-48 hours please call the anesthesia department.  QUESTIONS?:  Please feel free to call your physician or the hospital operator if you have any questions, and they will be happy to assist you.      Hammer Toes Hammer toes is a condition in which one or more of your toes is permanently flexed. CAUSES  This happens when a muscle imbalance or abnormal bone length makes your small toes buckle. This causes the toe joint to contract and the strong cord-like bands that attach muscles to the bones (tendons) in your toes to shorten.  SIGNS AND SYMPTOMS  Common symptoms of flexible hammer toes include:   A buildup of skin cells (corns). Corns occur where boney bumps come in frequent contact with hard surfaces. For example, where your shoes press and rub.  Irritation.  Inflammation.  Pain.  Limited motion in your toes. DIAGNOSIS  Hammer toes are diagnosed through a physical exam of your toes.  During the exam, your health care provider may try to reproduce your symptoms by manipulating your foot. Often, X-ray exams are done to determine the degree of deformity and to make sure that the cause is not a fracture.  TREATMENT  Hammer toes can be treated with corrective surgery. There are several types of surgical procedures that can treat hammer toes. The most common procedures include:  Arthroplasty--A portion of the joint is surgically removed and your toe is straightened. The gap fills in with fibrous tissue. This procedure helps treat pain and deformity and helps restore function.  Fusion--Cartilage  between the two bones of the affected joint is taken out and the bones fuse together into one longer bone. This helps keep your toe stable and reduces pain but leaves your toe stiff, yet straight.  Implantation--A portion of your bone is removed and replaced with an implant to restore motion.  Flexor tendon transfers--This procedure repositions the tendons that curl the toes down (flexor tendons). This may be done to release the deforming force that causes your toe to buckle. Several of these procedures require fixing your toe with a pin that is visible at the tip of your toe. The pin keeps the toe straight during healing. Your health care provider will remove the pin usually within 4-8 weeks after the procedure.  Document Released: 08/18/2000 Document Revised: 08/26/2013 Document Reviewed: 04/28/2013 [facility] Patient Information 2015 [place], [place]. This information is not intended to replace advice given to you by your health care provider. Make sure you discuss any questions you have with your health care provider. Bunionectomy A bunionectomy is surgery to remove a bunion. A bunion is an enlargement of the joint at the base of the big toe. It is made up of bone and soft tissue on the inside part of the joint. Over time, a painful lump appears on the inside of the joint. The big toe begins to point inward toward the second toe. New bone growth can occur and a bone spur may form. The pain eventually causes difficulty walking. A bunion usually results from inflammation caused by the irritation of poorly fitting shoes. It often begins later in life. A bunionectomy is performed when nonsurgical treatment no longer works. When surgery is needed, the extent of the procedure will depend on the degree of deformity of the foot. Your surgeon will discuss with you the different procedures and what will work best for you depending on your age and health. LET YOUR CAREGIVER KNOW ABOUT:   Previous problems with  anesthetics or medicines used to numb the skin.  Allergies to dyes, iodine, foods, and/or latex.  Medicines taken including herbs, eye drops, prescription medicines (especially medicines used to "thin the blood"), aspirin and other over-the-counter medicines, and steroids (by mouth or as a cream).  History of bleeding or blood problems.  Possibility of pregnancy, if this applies.  History of blood clots in your legs and/or lungs .  Previous surgery.  Other important health problems. RISKS AND COMPLICATIONS   Infection.  Pain.  Nerve damage.  Possibility that the bunion will recur. BEFORE THE PROCEDURE  You should be present 60 minutes prior to your procedure or as directed.  PROCEDURE  Surgery is often done so that you can go home the same day (outpatient). It may be done in a hospital or in an outpatient surgical center. An anesthetic will be used to help you sleep during the procedure. Sometimes, a spinal anesthetic is used to make you numb below  the waist. A cut (incision) is made over the swollen area at the first joint of the big toe. The enlarged lump will be removed. If there is a need to reposition the bones of the big toe, this may require more than 1 incision. The bone itself may need to be cut. Screws and wires may be used in the repair. These can be removed at a later date. In severe cases, the entire joint may need to be removed and a joint replacement inserted. When done, the incision is closed with stitches (sutures). Skin adhesive strips may be added for reinforcement. They help hold the incision closed.  AFTER THE PROCEDURE  Compression bandages (dressings) are then wrapped around the wound. This helps to keep the foot in alignment and reduce swelling. Your foot will be monitored for bleeding and swelling. You will need to stay for a few hours in the recovery area before being discharged. This allows time for the anesthesia to wear off. You will be discharged home when  you are awake, stable, and doing well. HOME CARE INSTRUCTIONS   You can expect to return to normal activities within 6 to 8 weeks after surgery. The foot is at increased risk for swelling for several months. When you can expect to bear weight on the operated foot will depend on the extent of your surgery. The milder the deformity, the less tissue is removed and the sooner the return to normal activity level. During the recovery period, a special shoe, boot, or cast may be worn to accommodate the surgical bandage and to help provide stability to the foot.  Once you are home, an ice pack applied to the operative site may help with discomfort and keep swelling down. Stop using the ice if it causes discomfort.  Keep your feet raised (elevated) when possible to lessen swelling.  If you have an elastic bandage on your foot and you have numbness, tingling, or your foot becomes cold and blue, adjust the bandage to make it comfortable.  Change dressings as directed.  Keep the wound dry and clean. The wound may be washed gently with soap and water. Gently blot dry without rubbing. Do not take baths or use swimming pools or hot tubs for 10 days, or as instructed by your caregiver.  Only take over-the-counter or prescription medicines for pain, discomfort, or fever as directed by your caregiver.  You may continue a normal diet as directed.  For activity, use crutches with no weight bearing or your orthopedic shoe as directed. Continue to use crutches or a cane as directed until you can stand without causing pain. SEEK MEDICAL CARE IF:   You have redness, swelling, bruising, or increasing pain in the wound.  There is pus coming from the wound.  You have drainage from a wound lasting longer than 1 day.  You have an oral temperature above 102 F (38.9 C).  You notice a bad smell coming from the wound or dressing.  The wound breaks open after sutures have been removed.  You develop dizzy episodes  or fainting while standing.  You have persistent nausea or vomiting.  Your toes become cold.  Pain is not relieved with medicines. SEEK IMMEDIATE MEDICAL CARE IF:   You develop a rash.  You have difficulty breathing.  You develop any reaction or side effects to medicines given.  Your toes are numb or blue, or you have severe pain. MAKE SURE YOU:   Understand these instructions.  Will watch your condition.  Will get help right away if you are not doing well or get worse. Document Released: 08/04/2005 Document Revised: 11/13/2011 Document Reviewed: 09/09/2007 [facility] Patient Information 2015 [place], [place]. This information is not intended to replace advice given to you by your health care provider. Make sure you discuss any questions you have with your health care provider.

## 2014-10-23 ENCOUNTER — Other Ambulatory Visit: Payer: PPO

## 2014-10-23 ENCOUNTER — Encounter (HOSPITAL_COMMUNITY)
Admission: RE | Admit: 2014-10-23 | Discharge: 2014-10-23 | Disposition: A | Discharge status: Home / Self Care | Payer: PPO | Source: Ambulatory Visit | Attending: Podiatry | Admitting: Podiatry

## 2014-10-23 ENCOUNTER — Ambulatory Visit (HOSPITAL_COMMUNITY)
Admission: RE | Admit: 2014-10-23 | Discharge: 2014-10-23 | Disposition: A | Discharge status: Home / Self Care | Payer: PPO | Source: Ambulatory Visit | Attending: Podiatry | Admitting: Podiatry

## 2014-10-23 ENCOUNTER — Encounter (HOSPITAL_COMMUNITY): Payer: Self-pay

## 2014-10-23 DIAGNOSIS — M2041 Other hammer toe(s) (acquired), right foot: Secondary | ICD-10-CM | POA: Insufficient documentation

## 2014-10-23 DIAGNOSIS — R52 Pain, unspecified: Secondary | ICD-10-CM

## 2014-10-23 LAB — CBC
HEMATOCRIT: 39.5 % (ref 36.0–46.0)
Hemoglobin: 12.7 g/dL (ref 12.0–15.0)
MCH: 32.1 pg (ref 26.0–34.0)
MCHC: 32.2 g/dL (ref 30.0–36.0)
MCV: 99.7 fL (ref 78.0–100.0)
Platelets: 204 10*3/uL (ref 150–400)
RBC: 3.96 MIL/uL (ref 3.87–5.11)
RDW: 15.2 % (ref 11.5–15.5)
WBC: 6.5 10*3/uL (ref 4.0–10.5)

## 2014-10-23 LAB — BASIC METABOLIC PANEL
Anion gap: 2 — ABNORMAL LOW (ref 5–15)
BUN: 16 mg/dL (ref 6–23)
CALCIUM: 9.5 mg/dL (ref 8.4–10.5)
CO2: 31 mmol/L (ref 19–32)
CREATININE: 0.95 mg/dL (ref 0.50–1.10)
Chloride: 105 mmol/L (ref 96–112)
GFR calc Af Amer: 66 mL/min — ABNORMAL LOW (ref >90)
GFR, EST NON AFRICAN AMERICAN: 57 mL/min — AB (ref >90)
GLUCOSE: 91 mg/dL (ref 70–99)
Potassium: 4.2 mmol/L (ref 3.5–5.1)
SODIUM: 138 mmol/L (ref 135–145)

## 2014-10-23 NOTE — Pre-Procedure Instructions (Signed)
Patient was late for appointment, as she was unsure of location.

## 2014-10-23 NOTE — Pre-Procedure Instructions (Signed)
Patient did not show for her pre op appointment.  Messages left for patient to contact [phone] to reschedule.  Dr Arvil Persons office notified.

## 2014-10-28 ENCOUNTER — Ambulatory Visit (HOSPITAL_COMMUNITY): Payer: PPO

## 2014-10-28 ENCOUNTER — Encounter (HOSPITAL_COMMUNITY): Payer: Self-pay | Admitting: *Deleted

## 2014-10-28 ENCOUNTER — Ambulatory Visit (HOSPITAL_COMMUNITY)
Admission: RE | Admit: 2014-10-28 | Discharge: 2014-10-28 | Disposition: A | Discharge status: Home / Self Care | Payer: PPO | Source: Ambulatory Visit | Attending: Podiatry | Admitting: Podiatry

## 2014-10-28 ENCOUNTER — Ambulatory Visit (HOSPITAL_COMMUNITY): Payer: PPO | Admitting: Anesthesiology

## 2014-10-28 ENCOUNTER — Encounter (HOSPITAL_COMMUNITY)
Admission: RE | Disposition: A | Discharge status: Home / Self Care | Payer: Self-pay | Source: Ambulatory Visit | Attending: Podiatry

## 2014-10-28 DIAGNOSIS — M2011 Hallux valgus (acquired), right foot: Secondary | ICD-10-CM | POA: Insufficient documentation

## 2014-10-28 DIAGNOSIS — M2041 Other hammer toe(s) (acquired), right foot: Secondary | ICD-10-CM | POA: Diagnosis not present

## 2014-10-28 DIAGNOSIS — Z9889 Other specified postprocedural states: Secondary | ICD-10-CM

## 2014-10-28 HISTORY — PX: HAMMER TOE SURGERY: SHX385

## 2014-10-28 HISTORY — PX: BUNIONECTOMY: SHX129

## 2014-10-28 HISTORY — PX: AIKEN OSTEOTOMY: SHX6331

## 2014-10-28 SURGERY — BUNIONECTOMY
Anesthesia: Monitor Anesthesia Care | Laterality: Right

## 2014-10-28 MED ORDER — PROPOFOL INFUSION 10 MG/ML OPTIME
INTRAVENOUS | Status: DC | PRN
  Administered 2014-10-28: 10:00:00 via INTRAVENOUS
  Administered 2014-10-28: 100 ug/kg/min via INTRAVENOUS

## 2014-10-28 MED ORDER — FENTANYL CITRATE 0.05 MG/ML IJ SOLN
25.0000 ug | INTRAMUSCULAR | Status: AC
  Administered 2014-10-28: 25 ug via INTRAVENOUS

## 2014-10-28 MED ORDER — FENTANYL CITRATE 0.05 MG/ML IJ SOLN
INTRAMUSCULAR | Status: DC | PRN
Start: 2014-10-28 — End: 2014-10-28
  Administered 2014-10-28 (×3): 25 ug via INTRAVENOUS

## 2014-10-28 MED ORDER — MIDAZOLAM HCL 2 MG/2ML IJ SOLN
1.0000 mg | INTRAMUSCULAR | Status: DC | PRN
  Administered 2014-10-28: 2 mg via INTRAVENOUS

## 2014-10-28 MED ORDER — FENTANYL CITRATE 0.05 MG/ML IJ SOLN
INTRAMUSCULAR | Status: AC
  Filled 2014-10-28: qty 2

## 2014-10-28 MED ORDER — PROPOFOL 10 MG/ML IV BOLUS
INTRAVENOUS | Status: AC
  Filled 2014-10-28: qty 20

## 2014-10-28 MED ORDER — LIDOCAINE HCL (PF) 1 % IJ SOLN
INTRAMUSCULAR | Status: AC
  Filled 2014-10-28: qty 5

## 2014-10-28 MED ORDER — PROPOFOL 10 MG/ML IV BOLUS
INTRAVENOUS | Status: DC | PRN
  Administered 2014-10-28 (×2): 10 mg via INTRAVENOUS

## 2014-10-28 MED ORDER — LIDOCAINE HCL (PF) 1 % IJ SOLN
INTRAMUSCULAR | Status: AC
  Filled 2014-10-28: qty 30

## 2014-10-28 MED ORDER — SODIUM CHLORIDE 0.9 % IR SOLN
Status: DC | PRN
  Administered 2014-10-28: 1000 mL

## 2014-10-28 MED ORDER — BUPIVACAINE HCL (PF) 0.5 % IJ SOLN
INTRAMUSCULAR | Status: AC
  Filled 2014-10-28: qty 30

## 2014-10-28 MED ORDER — BUPIVACAINE HCL (PF) 0.5 % IJ SOLN
INTRAMUSCULAR | Status: DC | PRN
  Administered 2014-10-28: 20 mL

## 2014-10-28 MED ORDER — MIDAZOLAM HCL 2 MG/2ML IJ SOLN
INTRAMUSCULAR | Status: AC
  Filled 2014-10-28: qty 2

## 2014-10-28 MED ORDER — ONDANSETRON HCL 4 MG/2ML IJ SOLN
INTRAMUSCULAR | Status: AC
  Filled 2014-10-28: qty 2

## 2014-10-28 MED ORDER — FENTANYL CITRATE 0.05 MG/ML IJ SOLN: 25.0000 ug | INTRAMUSCULAR | Status: DC | PRN

## 2014-10-28 MED ORDER — ONDANSETRON HCL 4 MG/2ML IJ SOLN
4.0000 mg | Freq: Once | INTRAMUSCULAR | Status: AC
  Administered 2014-10-28: 4 mg via INTRAVENOUS

## 2014-10-28 MED ORDER — LACTATED RINGERS IV SOLN
INTRAVENOUS | Status: DC
  Administered 2014-10-28: 08:00:00 via INTRAVENOUS

## 2014-10-28 MED ORDER — CEFAZOLIN SODIUM-DEXTROSE 2-3 GM-% IV SOLR
2.0000 g | Freq: Once | INTRAVENOUS | Status: AC
  Administered 2014-10-28: 2 g via INTRAVENOUS
  Filled 2014-10-28: qty 50

## 2014-10-28 MED ORDER — ONDANSETRON HCL 4 MG/2ML IJ SOLN: 4.0000 mg | Freq: Once | INTRAMUSCULAR | Status: DC | PRN

## 2014-10-28 SURGICAL SUPPLY — 62 items
APL SKNCLS STERI-STRIP NONHPOA (GAUZE/BANDAGES/DRESSINGS) ×1
BAG HAMPER (MISCELLANEOUS) ×3 IMPLANT
BANDAGE CONFORM 2  STR LF (GAUZE/BANDAGES/DRESSINGS) ×2 IMPLANT
BANDAGE ELASTIC 4 VELCRO NS (GAUZE/BANDAGES/DRESSINGS) ×3 IMPLANT
BANDAGE ESMARK 4X12 BL STRL LF (DISPOSABLE) ×1 IMPLANT
BANDAGE GAUZE ELAST BULKY 4 IN (GAUZE/BANDAGES/DRESSINGS) ×2 IMPLANT
BENZOIN TINCTURE PRP APPL 2/3 (GAUZE/BANDAGES/DRESSINGS) ×3 IMPLANT
BLADE 15 SAFETY STRL DISP (BLADE) ×2 IMPLANT
BLADE AVERAGE 25MMX9MM (BLADE) ×1
BLADE AVERAGE 25X9 (BLADE) ×2 IMPLANT
BLADE OSC/SAG 11.5X5.5X.38 (BLADE) ×2 IMPLANT
BLADE OSC/SAG 18.5X9 THN (BLADE) ×3 IMPLANT
BLADE OSC/SAGITTAL MD 5.5X18 (BLADE) ×3 IMPLANT
BLADE SURG 15 STRL LF DISP TIS (BLADE) IMPLANT
BLADE SURG 15 STRL SS (BLADE) ×9
BNDG CMPR 12X4 ELC STRL LF (DISPOSABLE) ×1
BNDG CONFORM 2 STRL LF (GAUZE/BANDAGES/DRESSINGS) ×3 IMPLANT
BNDG ESMARK 4X12 BLUE STRL LF (DISPOSABLE) ×3
BNDG GAUZE ELAST 4 BULKY (GAUZE/BANDAGES/DRESSINGS) ×3 IMPLANT
BOOT STEPPER DURA MED (SOFTGOODS) ×2 IMPLANT
CHLORAPREP W/TINT 26ML (MISCELLANEOUS) ×3 IMPLANT
CLOSURE WOUND 1/2 X4 (GAUZE/BANDAGES/DRESSINGS) ×2
CLOTH BEACON ORANGE TIMEOUT ST (SAFETY) ×3 IMPLANT
COVER LIGHT HANDLE STERIS (MISCELLANEOUS) ×6 IMPLANT
CUFF TOURNIQUET SINGLE 18IN (TOURNIQUET CUFF) ×3 IMPLANT
DECANTER SPIKE VIAL GLASS SM (MISCELLANEOUS) ×3 IMPLANT
DRAPE OEC MINIVIEW 54X84 (DRAPES) ×3 IMPLANT
DRSG ADAPTIC 3X8 NADH LF (GAUZE/BANDAGES/DRESSINGS) ×3 IMPLANT
ELECT REM PT RETURN 9FT ADLT (ELECTROSURGICAL) ×3
ELECTRODE REM PT RTRN 9FT ADLT (ELECTROSURGICAL) ×1 IMPLANT
GAUZE SPONGE 4X4 12PLY STRL (GAUZE/BANDAGES/DRESSINGS) ×3 IMPLANT
GLOVE BIO SURGEON STRL SZ7.5 (GLOVE) ×3 IMPLANT
GLOVE BIOGEL M 7.0 STRL (GLOVE) ×4 IMPLANT
GLOVE BIOGEL PI IND STRL 7.0 (GLOVE) IMPLANT
GLOVE BIOGEL PI INDICATOR 7.0 (GLOVE) ×4
GLOVE EXAM NITRILE MD LF STRL (GLOVE) ×2 IMPLANT
GOWN STRL REUS W/TWL LRG LVL3 (GOWN DISPOSABLE) ×9 IMPLANT
GUIDEWIRE DIGIFUSE .80X70MM (WIRE) ×2 IMPLANT
IMPLANT TOE DIGIFUSE 2.2 10DEG (Toe) ×4 IMPLANT
K-WIRE 6 (WIRE)
KIT ROOM TURNOVER AP CYSTO (KITS) ×3 IMPLANT
KIT ROOM TURNOVER APOR (KITS) ×3 IMPLANT
KWIRE 6 (WIRE) IMPLANT
MANIFOLD NEPTUNE II (INSTRUMENTS) ×3 IMPLANT
NDL HYPO 27GX1-1/4 (NEEDLE) ×3 IMPLANT
NEEDLE HYPO 27GX1-1/4 (NEEDLE) ×9 IMPLANT
NS IRRIG 1000ML POUR BTL (IV SOLUTION) ×3 IMPLANT
PACK BASIC LIMB (CUSTOM PROCEDURE TRAY) ×3 IMPLANT
PAD ARMBOARD 7.5X6 YLW CONV (MISCELLANEOUS) ×3 IMPLANT
PIN CAPS ORTHO GREEN .062 (PIN) IMPLANT
RASP SM TEAR CROSS CUT (RASP) IMPLANT
SET BASIN LINEN APH (SET/KITS/TRAYS/PACK) ×3 IMPLANT
SPONGE GAUZE 4X4 12PLY (GAUZE/BANDAGES/DRESSINGS) ×1 IMPLANT
SPONGE LAP 18X18 X RAY DECT (DISPOSABLE) ×3 IMPLANT
STAPLE FIXATION 8X8X8 (Staple) ×2 IMPLANT
STRIP CLOSURE SKIN 1/2X4 (GAUZE/BANDAGES/DRESSINGS) ×4 IMPLANT
SUT PROLENE 4 0 PS 2 18 (SUTURE) ×9 IMPLANT
SUT VIC AB 2-0 CT2 27 (SUTURE) ×3 IMPLANT
SUT VIC AB 4-0 PS2 27 (SUTURE) ×3 IMPLANT
SUT VICRYL AB 3-0 FS1 BRD 27IN (SUTURE) ×3 IMPLANT
SYRINGE 10CC LL (SYRINGE) ×9 IMPLANT
TOWEL OR 17X26 4PK STRL BLUE (TOWEL DISPOSABLE) ×3 IMPLANT

## 2014-10-28 NOTE — Anesthesia Postprocedure Evaluation (Signed)
  Anesthesia Post-op Note  Patient: [name]  Procedure(s) Performed: Procedure(s): SILVER BUNIONECTOMY RIGHT FOOT (Right) AIKEN OSTEOTOMY RIGHT FOOT (Right) HAMMER TOE CORRECTION 2ND AND 3RD TOE RIGHT FOOT (Right)  Patient Location: PACU  Anesthesia Type:MAC  Level of Consciousness: awake, alert  and oriented  Airway and Oxygen Therapy: Patient Spontanous Breathing  Post-op Pain: none  Post-op Assessment: Post-op Vital signs reviewed, Patient's Cardiovascular Status Stable, Respiratory Function Stable, Patent Airway and No signs of Nausea or vomiting  Post-op Vital Signs: Reviewed and stable  Last Vitals:  Filed Vitals:   10/28/14 0825  BP: 104/49  Temp:   Resp: 23    Complications: No apparent anesthesia complications

## 2014-10-28 NOTE — Transfer of Care (Signed)
Immediate Anesthesia Transfer of Care Note  Patient: [name]  Procedure(s) Performed: Procedure(s): SILVER BUNIONECTOMY RIGHT FOOT (Right) AIKEN OSTEOTOMY RIGHT FOOT (Right) HAMMER TOE CORRECTION 2ND AND 3RD TOE RIGHT FOOT (Right)  Patient Location: PACU  Anesthesia Type:MAC  Level of Consciousness: awake, alert  and oriented  Airway & Oxygen Therapy: Patient Spontanous Breathing and Patient connected to nasal cannula oxygen  Post-op Assessment: Report given to RN  Post vital signs: Reviewed and stable  Last Vitals:  Filed Vitals:   10/28/14 0825  BP: 104/49  Temp:   Resp: 23    Complications: No apparent anesthesia complications

## 2014-10-28 NOTE — H&P (Signed)
HISTORY AND PHYSICAL INTERVAL NOTE:  10/28/2014  8:17 AM  [name]  has presented today for surgery, with the diagnosis of hallux valgus, hammertoe of right foot.  The various methods of treatment have been discussed with the patient.  No guarantees were given.  After consideration of risks, benefits and other options for treatment, the patient has consented to surgery.  I have reviewed the patients' chart and labs.    Patient Vitals for the past 24 hrs:  BP Temp Resp SpO2  10/28/14 0805 (!) 111/52 mmHg - 14 98 %  10/28/14 0800 106/61 mmHg - (!) 28 99 %  10/28/14 0755 (!) 122/54 mmHg 98.7 F (37.1 C) (!) 41 91 %  10/28/14 0750 (!) 119/56 mmHg - (!) 24 91 %  10/28/14 0745 117/62 mmHg - (!) 29 92 %  10/28/14 0740 109/60 mmHg - (!) 33 98 %  10/28/14 0735 127/61 mmHg - (!) 21 96 %  10/28/14 0730 120/63 mmHg - 19 98 %  10/28/14 0725 - - - 97 %    A history and physical examination was performed in my office.  The patient was reexamined.  There have been no changes to this history and physical examination.  Marcheta Grammes, DPM

## 2014-10-28 NOTE — Op Note (Signed)
OPERATIVE NOTE  DATE OF PROCEDURE:  10/28/2014  SURGEON:   Marcheta Grammes, DPM  OR STAFF:   Circulator: [name], RN Scrub Person: Evelene Croon, CST RN First Assistant: Beckie Salts Page, RN   PREOPERATIVE DIAGNOSIS:   1.  Hallux valgus, right foot 2.  Hammertoe deformity of the second toe, right foot 3.  Hammertoe deformity of the third toe, right foot  POSTOPERATIVE DIAGNOSIS: Same  PROCEDURE: 1.  Silver bunionectomy, right foot 2.  Akin osteotomy, right foot 3.  Hammertoe repair of the second toe, right foot 4.  Hammertoe repair of the third toe, right foot  ANESTHESIA:  Monitor Anesthesia Care   HEMOSTASIS:   Pneumatic ankle tourniquet set at 250 mmHg  ESTIMATED BLOOD LOSS:   Minimal (<5 cc)  MATERIALS USED:  1.  Stryker EasyClip x 1 2.  Integra DigiFuse x 2  INJECTABLES: Marcaine 0.5% plain; 35mL  PATHOLOGY:   None  COMPLICATIONS:   None  INDICATIONS:  Painful hallux valgus deformity of the right foot and painful hammertoe deformities of the right foot.  DESCRIPTION OF THE PROCEDURE:   The patient was brought to the operating room and placed on the operative table in the supine position.  A pneumatic ankle tourniquet was applied to the patient's ankle.  Following sedation, the surgical site was anesthetized with 0.5% Marcaine plain.  The foot was then prepped, scrubbed, and draped in the usual sterile technique.  The foot was elevated, exsanguinated and the pneumatic ankle tourniquet inflated to 250 mmHg.    Attention was directed to the dorsal medial aspect of the right foot.  A linear longitudinal incision was made medial and parallel to the extensor hallucis longus tendon.  Dissection was continued deep down to the level of the first metatarsophalangeal joint.  A linear longitudinal periosteal and capsular incision was performed.  The periosteal and capsular structures were reflected medially and laterally thus exposing the head of the  first metatarsal and base of the proximal phalanx.  The medial prominence of the first metatarsal head was resected using a power bone saw.  Attention was directed to the first interspace via the original skin incision.  The adductor hallucis tendon was identified and transected at its attachment to the base of the proximal phalanx.  Attention was directed to the proximal phalanx.  A wedge shaped osteotomy was performed with the base oriented medially and apex laterally.  The lateral hinge remained intact.  The wedge of bone was removed and passed from the operative field.  The lateral hinge was feathered to allow for adequate closure of the osteotomy.  The osteotomy was fixated with an [employer].  Position of the toe and hardware was evaluated with fluoroscopy and found to be in acceptable position.  The surgical wound was irrigated with copious amounts of sterile irrigant.  The capsule was reapproximated using 2-0 Vicryl.  The subcutaneous structures were reapproximated using 4-0 Vicryl.  The skin was reapproximated using 4-0 Prolene.  Attention was directed to the dorsal aspect of the second toe.  A linear longitudinal incision was made.  Dissection was continued deep down to the level of the proximal interphalangeal joint.  A transverse tenotomy capsulotomy was performed exposing the head of the proximal phalanx and base the middle phalanx.  The articular surface of the head of the proximal phalanx was resected.  The articular surface of the base of the middle phalanx was resected.  A Integra DigiFuse implant was inserted across the  proximal interphalangeal joint.  Position of the implant was confirmed with fluoroscopy.  Attention was directed to the dorsal aspect of the third toe.  A linear longitudinal incision was made.  Dissection was continued deep down to the level of the proximal interphalangeal joint.  A transverse tenotomy capsulotomy was performed exposing the head of the proximal phalanx  and base the middle phalanx.  The articular surface of the head of the proximal phalanx was resected.  The articular surface of the base of the middle phalanx was resected.  A Integra DigiFuse implant was inserted across the proximal interphalangeal joint.  Position of the implant was confirmed with fluoroscopy.  Persistent contracture of the distal interphalangeal joint was identified.  A percutaneous flexor tenotomy of the third toe was performed.  The extensor tendon of the second digit was reapproximated using 4-0 Vicryl.  The extensor tendon of the third digit of the right foot was reapproximated using 4-0 Vicryl.  The extensor tendons to the second and third digits were found to remain taut producing extension at the metatarsophalangeal joint.  A percutaneous tenotomy of the extensor tendons to the second and third digits were performed proximal to the MPJ.  Subcutaneous structures of digits 2 and 3 were reapproximated with 4-0 Vicryl.  The skin incisions were reapproximated with 4-0 Prolene.  A sterile compressive dressing was applied to the right foot.  The pneumatic ankle tourniquet was deflated and a prompt hyperemic response was noted to all digits of the right foot.   The patient tolerated the procedure well.  The patient was then transferred to PACU with vital signs stable and vascular status intact to all toes of the operative foot.  Following a period of postoperative monitoring, the patient will be discharged home.

## 2014-10-28 NOTE — Addendum Note (Signed)
Addended by: Caprice Beaver on: 10/28/2014 11:14 AM   Modules accepted: Orders

## 2014-10-28 NOTE — Anesthesia Preprocedure Evaluation (Signed)
Anesthesia Evaluation  Patient identified by MRN, date of birth, ID band Patient awake    Reviewed: Allergy & Precautions, NPO status , Patient's Chart, lab work & pertinent test results  History of Anesthesia Complications (+) PONV and history of anesthetic complications  Airway Mallampati: II  TM Distance: >3 FB     Dental  (+) Teeth Intact   Pulmonary shortness of breath and with exertion,  breath sounds clear to auscultation        Cardiovascular hypertension, Pt. on medications Rhythm:Regular Rate:Normal     Neuro/Psych    GI/Hepatic GERD-  Controlled and Medicated,  Endo/Other    Renal/GU      Musculoskeletal  (+) Arthritis -, Rheumatoid disorders,    Abdominal   Peds  Hematology   Anesthesia Other Findings   Reproductive/Obstetrics                             Anesthesia Physical Anesthesia Plan  ASA: III  Anesthesia Plan: MAC   Post-op Pain Management:    Induction: Intravenous  Airway Management Planned: Simple Face Mask  Additional Equipment:   Intra-op Plan:   Post-operative Plan:   Informed Consent: I have reviewed the patients History and Physical, chart, labs and discussed the procedure including the risks, benefits and alternatives for the proposed anesthesia with the patient or authorized representative who has indicated his/her understanding and acceptance.     Plan Discussed with:   Anesthesia Plan Comments:         Anesthesia Quick Evaluation

## 2014-10-28 NOTE — Discharge Instructions (Signed)
Incision Care °An incision is when a surgeon cuts into your body tissues. After surgery, the incision needs to be cared for properly to prevent infection.  °HOME CARE INSTRUCTIONS  °· Take all medicine as directed by your caregiver. Only take over-the-counter or prescription medicines for pain, discomfort, or fever as directed by your caregiver. °· Do not remove your bandage (dressing) or get your incision wet until your surgeon gives you permission. In the event that your dressing becomes wet, dirty, or starts to smell, change the dressing and call your surgeon for instructions as soon as possible. °· Take showers. Do not take tub baths, swim, or do anything that may soak the wound until it is healed. °· Resume your normal diet and activities as directed or allowed. °· Avoid lifting any weight until you are instructed otherwise. °· Use anti-itch antihistamine medicine as directed by your caregiver. The wound may itch when it is healing. Do not pick or scratch at the wound. °· Follow up with your caregiver for stitch (suture) or staple removal as directed. °· Drink enough fluids to keep your urine clear or pale yellow. °SEEK MEDICAL CARE IF:  °· You have redness, swelling, or increasing pain in the wound that is not controlled with medicine. °· You have drainage, blood, or pus coming from the wound that lasts longer than 1 day. °· You develop muscle aches, chills, or a general ill feeling. °· You notice a bad smell coming from the wound or dressing. °· Your wound edges separate after the sutures, staples, or skin adhesive strips have been removed. °· You develop persistent nausea or vomiting. °SEEK IMMEDIATE MEDICAL CARE IF:  °· You have a fever. °· You develop a rash. °· You develop dizzy episodes or faint while standing. °· You have difficulty breathing. °· You develop any reaction or side effects to medicine given. °MAKE SURE YOU:  °· Understand these instructions. °· Will watch your condition. °· Will get help  right away if you are not doing well or get worse. °Document Released: 03/10/2005 Document Revised: 11/13/2011 Document Reviewed: 10/15/2013 °ExitCare® Patient Information ©2015 [employer]. This information is not intended to replace advice given to you by your health care provider. Make sure you discuss any questions you have with your health care provider. ° ° °

## 2014-10-29 ENCOUNTER — Encounter (HOSPITAL_COMMUNITY): Payer: Self-pay | Admitting: Podiatry

## 2015-04-23 ENCOUNTER — Other Ambulatory Visit (HOSPITAL_COMMUNITY): Payer: Self-pay | Admitting: Internal Medicine

## 2015-04-23 DIAGNOSIS — Z1231 Encounter for screening mammogram for malignant neoplasm of breast: Secondary | ICD-10-CM

## 2015-04-29 ENCOUNTER — Ambulatory Visit (HOSPITAL_COMMUNITY)
Admission: RE | Admit: 2015-04-29 | Discharge: 2015-04-29 | Disposition: A | Discharge status: Home / Self Care | Payer: PPO | Source: Ambulatory Visit | Attending: Internal Medicine | Admitting: Internal Medicine

## 2015-04-29 DIAGNOSIS — Z1231 Encounter for screening mammogram for malignant neoplasm of breast: Secondary | ICD-10-CM | POA: Insufficient documentation

## 2015-09-06 DIAGNOSIS — H2513 Age-related nuclear cataract, bilateral: Secondary | ICD-10-CM | POA: Diagnosis not present

## 2015-09-06 DIAGNOSIS — H52223 Regular astigmatism, bilateral: Secondary | ICD-10-CM | POA: Diagnosis not present

## 2015-09-06 DIAGNOSIS — H5203 Hypermetropia, bilateral: Secondary | ICD-10-CM | POA: Diagnosis not present

## 2015-09-06 DIAGNOSIS — H43813 Vitreous degeneration, bilateral: Secondary | ICD-10-CM | POA: Diagnosis not present

## 2015-09-14 ENCOUNTER — Emergency Department (HOSPITAL_COMMUNITY): Payer: PPO

## 2015-09-14 ENCOUNTER — Encounter (HOSPITAL_COMMUNITY): Payer: Self-pay | Admitting: Emergency Medicine

## 2015-09-14 ENCOUNTER — Emergency Department (HOSPITAL_COMMUNITY)
Admission: EM | Admit: 2015-09-14 | Discharge: 2015-09-14 | Disposition: A | Discharge status: Home / Self Care | Payer: PPO | Attending: Emergency Medicine | Admitting: Emergency Medicine

## 2015-09-14 DIAGNOSIS — Z853 Personal history of malignant neoplasm of breast: Secondary | ICD-10-CM | POA: Diagnosis not present

## 2015-09-14 DIAGNOSIS — M810 Age-related osteoporosis without current pathological fracture: Secondary | ICD-10-CM | POA: Insufficient documentation

## 2015-09-14 DIAGNOSIS — L988 Other specified disorders of the skin and subcutaneous tissue: Secondary | ICD-10-CM | POA: Diagnosis not present

## 2015-09-14 DIAGNOSIS — Z8781 Personal history of (healed) traumatic fracture: Secondary | ICD-10-CM | POA: Diagnosis not present

## 2015-09-14 DIAGNOSIS — K219 Gastro-esophageal reflux disease without esophagitis: Secondary | ICD-10-CM | POA: Diagnosis not present

## 2015-09-14 DIAGNOSIS — S90424A Blister (nonthermal), right lesser toe(s), initial encounter: Secondary | ICD-10-CM

## 2015-09-14 DIAGNOSIS — L089 Local infection of the skin and subcutaneous tissue, unspecified: Secondary | ICD-10-CM | POA: Diagnosis not present

## 2015-09-14 DIAGNOSIS — Z79899 Other long term (current) drug therapy: Secondary | ICD-10-CM | POA: Diagnosis not present

## 2015-09-14 DIAGNOSIS — M7989 Other specified soft tissue disorders: Secondary | ICD-10-CM | POA: Diagnosis not present

## 2015-09-14 DIAGNOSIS — I10 Essential (primary) hypertension: Secondary | ICD-10-CM | POA: Insufficient documentation

## 2015-09-14 DIAGNOSIS — M069 Rheumatoid arthritis, unspecified: Secondary | ICD-10-CM | POA: Insufficient documentation

## 2015-09-14 LAB — BASIC METABOLIC PANEL
Anion gap: 5 (ref 5–15)
BUN: 24 mg/dL — AB (ref 6–20)
CO2: 29 mmol/L (ref 22–32)
CREATININE: 1 mg/dL (ref 0.44–1.00)
Calcium: 9.3 mg/dL (ref 8.9–10.3)
Chloride: 109 mmol/L (ref 101–111)
GFR calc Af Amer: >60 mL/min (ref >60)
GFR calc non Af Amer: 53 mL/min — ABNORMAL LOW (ref >60)
Glucose, Bld: 114 mg/dL — ABNORMAL HIGH (ref 65–99)
POTASSIUM: 4.1 mmol/L (ref 3.5–5.1)
SODIUM: 143 mmol/L (ref 135–145)

## 2015-09-14 LAB — CBC WITH DIFFERENTIAL/PLATELET
BASOS PCT: 1 %
Basophils Absolute: 0 10*3/uL (ref 0.0–0.1)
EOS ABS: 0.3 10*3/uL (ref 0.0–0.7)
EOS PCT: 6 %
HCT: 39.5 % (ref 36.0–46.0)
Hemoglobin: 12.6 g/dL (ref 12.0–15.0)
LYMPHS ABS: 1.1 10*3/uL (ref 0.7–4.0)
Lymphocytes Relative: 28 %
MCH: 32.4 pg (ref 26.0–34.0)
MCHC: 31.9 g/dL (ref 30.0–36.0)
MCV: 101.5 fL — ABNORMAL HIGH (ref 78.0–100.0)
Monocytes Absolute: 0.5 10*3/uL (ref 0.1–1.0)
Monocytes Relative: 12 %
Neutro Abs: 2.2 10*3/uL (ref 1.7–7.7)
Neutrophils Relative %: 53 %
PLATELETS: 175 10*3/uL (ref 150–400)
RBC: 3.89 MIL/uL (ref 3.87–5.11)
RDW: 16.4 % — ABNORMAL HIGH (ref 11.5–15.5)
WBC: 4.1 10*3/uL (ref 4.0–10.5)

## 2015-09-14 LAB — LACTIC ACID, PLASMA: LACTIC ACID, VENOUS: 0.7 mmol/L (ref 0.5–2.0)

## 2015-09-14 MED ORDER — DOXYCYCLINE HYCLATE 100 MG PO TABS
100.0000 mg | ORAL_TABLET | Freq: Once | ORAL | Status: AC
  Administered 2015-09-14: 100 mg via ORAL
  Filled 2015-09-14: qty 1

## 2015-09-14 MED ORDER — CEPHALEXIN 500 MG PO CAPS: 500.0000 mg | ORAL_CAPSULE | Freq: Four times a day (QID) | ORAL | Status: DC

## 2015-09-14 MED ORDER — CEPHALEXIN 500 MG PO CAPS
500.0000 mg | ORAL_CAPSULE | Freq: Once | ORAL | Status: DC
  Filled 2015-09-14: qty 1

## 2015-09-14 MED ORDER — DOXYCYCLINE HYCLATE 100 MG PO CAPS: 100.0000 mg | ORAL_CAPSULE | Freq: Two times a day (BID) | ORAL | Status: DC

## 2015-09-14 MED ORDER — SULFAMETHOXAZOLE-TRIMETHOPRIM 800-160 MG PO TABS
1.0000 | ORAL_TABLET | Freq: Once | ORAL | Status: DC
  Filled 2015-09-14: qty 1

## 2015-09-14 NOTE — Discharge Instructions (Signed)
Blisters A blister is a fluid-filled sac that forms between layers of skin. Blisters often form in areas where skin rubs against other skin or rubs against something else. The most common areas for blisters are the hands and feet. CAUSES A blister can be caused by:  An injury.  A burn.  An allergic reaction.  An infection.  Exposure to irritating chemicals.  Friction. Friction blisters often result from:  Sports.  Repetitive activities.  Shoes that are too tight or too loose. SIGNS AND SYMPTOMS A blister is often round and looks like a bump. It may itch or be painful to the touch. The liquid in a blister is clear or bloody. Before a blister forms, the skin may become red, feel warm, itch, or be painful to the touch. DIAGNOSIS A blister can usually be diagnosed from its appearance. TREATMENT Treatment involves protecting the area where the blister has formed until the skin has healed. If something is likely to rub against the blister, apply a bandage (dressing) with a hole in the middle over the blister. Most blisters break open, dry up, and go away on their own within 10 days. Rarely, blisters that are very painful may be drained before they break open on their own. Draining of a blister should only be done by a health care provider under sterile conditions. HOME CARE INSTRUCTIONS  Protect the area where the blister has formed as directed by your health care provider.  Do not open or pop your blister, because it could become infected.  If the blister is very painful, ask your health care provider whether you should have it drained.  If the blister breaks open on its own:  Do not remove the loose skin that is over the blister.  Wash the blister area with soap and water every day.  After washing the blister area, you may apply an antibiotic cream or ointment and cover the area with a bandage. PREVENTION Taking these steps can help to prevent blisters that are caused by  friction:  Wear comfortable shoes that fit well.  Always wear socks with shoes.  Wear extra socks or use tape, bandages, or pads over blister-prone areas as needed.  Wear protective gear, such as gloves, when participating in sports or activities that can cause blisters.  Use powders as needed to keep your feet dry. SEEK MEDICAL CARE IF:  You have increased redness, swelling, or pain in the blister area.  A puslike discharge is coming from the blister area.  You have a fever.  You have chills.   This information is not intended to replace advice given to you by your health care provider. Make sure you discuss any questions you have with your health care provider.   Document Released: 09/28/2004 Document Revised: 09/11/2014 Document Reviewed: 03/21/2014 Elsevier Interactive Patient Education 2016 [place] may continue to apply neosporin if you wish but make sure you are completing a 15 minute epsom salt soak twice daily, dry your toe completely before covering. Call Dr. [place] Crane for a recheck of your infection if not responding to the antibiotics over the next 3 days

## 2015-09-14 NOTE — ED Notes (Signed)
Pt states understanding of care given and follow up instructions 

## 2015-09-14 NOTE — ED Notes (Signed)
Patient states she had surgery on two toes on her right foot a year ago. States "I had a blister pop up on it a couple of months ago and called the doctor today and he told me to come to the ER right away because it may go into osteomyelitis." States the wound has been draining for over a week after she popped it with a needle.

## 2015-09-16 NOTE — ED Provider Notes (Signed)
CSN: [id number]     Arrival date & time 09/14/15  1903 History   First MD Initiated Contact with Patient 09/14/15 2151     Chief Complaint  Patient presents with  . Wound Infection     (Consider location/radiation/quality/duration/timing/severity/associated sxs/prior Treatment) The history is provided by the patient.   [name] is a 75 y.o. female presenting for evaluation of a wound along the medial edge of her right 2nd toe. She states a blister popped up on it several months ago and she popped it with a needle last week now with continued clear drainage from the site, although she endorses initially white purulence drained from it.  She denies significant pain at the site.  She notified her orthopedist of this problem and was advised to come to make sure she did not have osteomyelitis as this is the site of fixation screw surgery one year ago.  She denies fevers, chills, worsened pain, red streaking.  This toe has been swollen since the blister formed.  She endorses an early similar irritation of the same toe of her left foot, without blister or drainage.  She reports wearing comfortable orthopedic shoes which are not new and fit her well. She has had no other treatment for todays complaint.    Past Medical History  Diagnosis Date  . Rheumatoid arteritis   . Breast cancer ([place])   . Scoliosis   . Bulging disc   . Arm fracture, left   . Rotator cuff disorder, right   . GERD (gastroesophageal reflux disease)   . Hypertension   . Osteoporosis   . Invasive ductal carcinoma of left breast ([place]) 02/24/2014   Past Surgical History  Procedure Laterality Date  . Abdominal hysterectomy    . Breast surgery      lumpectomy and axillary lymph node with re-excision 2 weeks later   . Esophageal stricture    . Dilation and curettage of uterus    . Bunionectomy Right 10/28/2014    Procedure: SILVER BUNIONECTOMY RIGHT FOOT;  Surgeon: Marcheta Grammes, DPM;  Location: [facility];  Service:  Podiatry;  Laterality: Right;  . Aiken osteotomy Right 10/28/2014    Procedure: Barbie Banner OSTEOTOMY RIGHT FOOT;  Surgeon: Marcheta Grammes, DPM;  Location: [facility];  Service: Podiatry;  Laterality: Right;  . Hammer toe surgery Right 10/28/2014    Procedure: HAMMER TOE CORRECTION 2ND AND 3RD TOE RIGHT FOOT;  Surgeon: Marcheta Grammes, DPM;  Location: [facility];  Service: Podiatry;  Laterality: Right;   Family History  Problem Relation Age of Onset  . Hypertension Mother   . Cancer Maternal Grandmother   . Cancer Cousin    Social History  Substance Use Topics  . Smoking status: Never Smoker   . Smokeless tobacco: Never Used  . Alcohol Use: No   OB History    No data available     Review of Systems  Constitutional: Negative for fever and chills.  Respiratory: Negative.   Cardiovascular: Negative.   Musculoskeletal: Negative.   Skin: Positive for color change and wound.  Neurological: Negative for numbness.      Allergies  Celecoxib; Ibuprofen; Wygesic; and Diphenhydramine  Home Medications   Prior to Admission medications   Medication Sig Start Date End Date Taking? Authorizing Provider  albuterol (PROAIR HFA) 108 (90 Base) MCG/ACT inhaler Inhale 1-2 puffs into the lungs every 6 (six) hours as needed for wheezing or shortness of breath.   Yes Historical Provider, MD  alendronate (FOSAMAX)  70 MG tablet Take 70 mg by mouth every 7 (seven) days. Take with a full glass of water on an empty stomach.   Yes Historical Provider, MD  Calcium Carbonate-Vitamin D (CALTRATE 600+D) 600-400 MG-UNIT tablet Take 1 tablet by mouth 2 (two) times daily.   Yes Historical Provider, MD  cetirizine (ZYRTEC) 10 MG tablet Take 10 mg by mouth daily as needed for allergies.    Yes Historical Provider, MD  Cholecalciferol (VITAMIN D) 2000 UNITS CAPS Take 1 capsule by mouth daily.    Yes Historical Provider, MD  folic acid (FOLVITE) Q000111Q MCG tablet Take 800 mcg by mouth daily.   Yes Historical  Provider, MD  lisinopril (PRINIVIL,ZESTRIL) 10 MG tablet Take 10 mg by mouth every morning.  05/30/13  Yes Historical Provider, MD  Magnesium 500 MG CAPS Take 1 capsule by mouth daily.   Yes Historical Provider, MD  methotrexate (RHEUMATREX) 2.5 MG tablet TAKE 10 TABLETS BY MOUTH ONCE A WEEK WITH MEALS. 08/31/15  Yes Historical Provider, MD  omeprazole-sodium bicarbonate (ZEGERID) 40-1100 MG per capsule Take 1 capsule by mouth daily before breakfast.   Yes Historical Provider, MD  potassium chloride (K-DUR) 10 MEQ tablet Take 10 mEq by mouth at bedtime.   Yes Historical Provider, MD  sennosides-docusate sodium (SENOKOT-S) 8.6-50 MG tablet Take 1 tablet by mouth 2 (two) times daily.   Yes Historical Provider, MD  Zinc 50 MG CAPS Take 1 capsule by mouth daily.    Yes Historical Provider, MD  doxycycline (VIBRAMYCIN) 100 MG capsule Take 1 capsule (100 mg total) by mouth 2 (two) times daily. 09/14/15   Evalee Jefferson, PA-C   BP 120/64 mmHg  Pulse 70  Temp(Src) 98.1 F (36.7 C) (Oral)  Resp 18  Ht 4\' 10"  (1.473 m)  Wt 53.524 kg  BMI 24.67 kg/m2  SpO2 98% Physical Exam  Constitutional: She appears well-developed and well-nourished. No distress.  HENT:  Head: Normocephalic.  Neck: Neck supple.  Cardiovascular: Normal rate.   Pulmonary/Chest: Effort normal. She has no wheezes.  Musculoskeletal: Normal range of motion. She exhibits no edema.  Skin:  Mild edema and erythema of right second toe with raised hyperkeratotic lesion medial aspect. No spontaneous drainage, no fluctuance. No red streaking. No foot pain or swelling. Skin otherwise intact. Small reddened area left 2nd toe as well, medial side, no lesion or hyperkeratosis.    ED Course  Procedures (including critical care time) Labs Review Labs Reviewed  CBC WITH DIFFERENTIAL/PLATELET - Abnormal; Notable for the following:    MCV 101.5 (*)    RDW 16.4 (*)    All other components within normal limits  BASIC METABOLIC PANEL - Abnormal;  Notable for the following:    Glucose, Bld 114 (*)    BUN 24 (*)    GFR calc non Af Amer 53 (*)    All other components within normal limits  LACTIC ACID, PLASMA    Imaging Review Dg Foot Complete Right  09/14/2015  CLINICAL DATA:  75 year old female with swelling, redness, and drainage from the distal aspect of the right second toe. EXAM: RIGHT FOOT COMPLETE - 3+ VIEW COMPARISON:  Radiograph dated 10/28/2014 FINDINGS: Stable postsurgical osteotomy of the proximal phalanges of the first, second and third toes with fixation screws similar to prior study. There is no acute fracture or dislocation. There is slight elevation of the distal end of the proximal phalanx of the second toe in relation to the middle phalanx on the oblique projection. The bones are  osteopenic. There is soft tissue swelling of the second toe. IMPRESSION: Stable appearing postsurgical changes of the first-third toe. Slight elevated appearance of the proximal phalanx of the second toe in relation to the middle phalanx on the oblique projection. Clinical correlation is recommended. No acute fracture identified. Electronically Signed   By: Anner Crete M.D.   On: 09/14/2015 22:49   I have personally reviewed and evaluated these images and lab results as part of my medical decision-making.   EKG Interpretation None      MDM   Final diagnoses:  Blister of toe with infection, right, initial encounter   Given hx of purulent drainage, yet none on todays exam, will cover with abx.  Doxycycline prescribed, first dose given. Advised warm soaks, f/u with pcp for a recheck for any worsened or new sx.    Patients labs reviewed.  Radiological studies were viewed, interpreted and considered during the medical decision making and disposition process. I agree with radiologists reading.  Results were also discussed with patient.   Pt seen by Dr. Thurnell Garbe during this visit.     Evalee Jefferson, PA-C 09/16/15 [place],  DO 09/18/15 [phone]

## 2015-09-21 DIAGNOSIS — L03031 Cellulitis of right toe: Secondary | ICD-10-CM | POA: Diagnosis not present

## 2015-09-21 DIAGNOSIS — L0390 Cellulitis, unspecified: Secondary | ICD-10-CM | POA: Diagnosis not present

## 2015-09-21 DIAGNOSIS — L97519 Non-pressure chronic ulcer of other part of right foot with unspecified severity: Secondary | ICD-10-CM | POA: Diagnosis not present

## 2015-09-21 DIAGNOSIS — L97512 Non-pressure chronic ulcer of other part of right foot with fat layer exposed: Secondary | ICD-10-CM | POA: Diagnosis not present

## 2015-09-22 DIAGNOSIS — L97811 Non-pressure chronic ulcer of other part of right lower leg limited to breakdown of skin: Secondary | ICD-10-CM | POA: Diagnosis not present

## 2015-09-23 ENCOUNTER — Other Ambulatory Visit: Payer: Self-pay | Admitting: Podiatry

## 2015-09-28 ENCOUNTER — Encounter (HOSPITAL_COMMUNITY)
Admission: RE | Admit: 2015-09-28 | Discharge: 2015-09-28 | Disposition: A | Discharge status: Home / Self Care | Payer: PPO | Source: Ambulatory Visit | Attending: Podiatry | Admitting: Podiatry

## 2015-09-28 ENCOUNTER — Ambulatory Visit (HOSPITAL_COMMUNITY)
Admission: RE | Admit: 2015-09-28 | Discharge: 2015-09-28 | Disposition: A | Discharge status: Home / Self Care | Payer: PPO | Source: Ambulatory Visit | Attending: Podiatry | Admitting: Podiatry

## 2015-09-28 ENCOUNTER — Encounter (HOSPITAL_COMMUNITY): Payer: Self-pay

## 2015-09-28 DIAGNOSIS — L97512 Non-pressure chronic ulcer of other part of right foot with fat layer exposed: Secondary | ICD-10-CM | POA: Diagnosis not present

## 2015-09-28 DIAGNOSIS — L03031 Cellulitis of right toe: Secondary | ICD-10-CM | POA: Diagnosis not present

## 2015-09-28 DIAGNOSIS — Z01818 Encounter for other preprocedural examination: Secondary | ICD-10-CM | POA: Diagnosis not present

## 2015-09-28 DIAGNOSIS — L97509 Non-pressure chronic ulcer of other part of unspecified foot with unspecified severity: Secondary | ICD-10-CM | POA: Diagnosis not present

## 2015-09-28 HISTORY — DX: Polyneuropathy, unspecified: G62.9

## 2015-09-28 HISTORY — DX: Reserved for inherently not codable concepts without codable children: IMO0001

## 2015-09-28 HISTORY — DX: Hypothyroidism, unspecified: E03.9

## 2015-09-28 NOTE — Pre-Procedure Instructions (Signed)
Patient given information to sign up for my chart at home. 

## 2015-09-28 NOTE — Patient Instructions (Signed)
[name]  09/28/2015     @PREFPERIOPPHARMACY @   Your procedure is scheduled on  09/29/2015 .  Report to [facility] at  700  A.M.  Call this number if you have problems the morning of surgery:  [phone]   Remember:  Do not eat food or drink liquids after midnight.  Take these medicines the morning of surgery with A SIP OF WATER  Zyrtec, hydrocodone, lisinopril, zegerid. Take your inhaler before you come and bring it with you.   Do not wear jewelry, make-up or nail polish.  Do not wear lotions, powders, or perfumes.  You may wear deodorant.  Do not shave 48 hours prior to surgery.  Men may shave face and neck.  Do not bring valuables to the hospital.  [facility] is not responsible for any belongings or valuables.  Contacts, dentures or bridgework may not be worn into surgery.  Leave your suitcase in the car.  After surgery it may be brought to your room.  For patients admitted to the hospital, discharge time will be determined by your treatment team.  Patients discharged the day of surgery will not be allowed to drive home.   Name and phone number of your driver:   family Special instructions:  none  Please read over the following fact sheets that you were given. Pain Booklet, Coughing and Deep Breathing, Surgical Site Infection Prevention, Anesthesia Post-op Instructions and Care and Recovery After Surgery      Hardware Removal Hardware removal is a procedure to remove from the body any medical parts that were used to repair a broken bone, such as pins, screws, rods, wires, and plates. This procedure may be done to:  Remove medical parts that are normally removed after a broken bone has healed.  Remove medical parts that are causing problems, such as infection or pain.  Remove medical parts that are not working.  Replace medical parts with newer, better materials. LET [facility] CARE PROVIDER KNOW ABOUT:  Any allergies you have.  All  medicines you are taking, including vitamins, herbs, eye drops, creams, and over-the-counter medicines. This includes any use of steroids, either by mouth or in cream form.  Previous problems you or members of your family have had with the use of anesthetics.  Any blood disorders you have.  Previous surgeries you have had.  Any medical conditions you may have.  Possibility of pregnancy, if this applies. RISKS AND COMPLICATIONS Generally, this is a safe procedure. However, problems may occur, including:  Infection.  Bleeding.  Pain.  The bone breaking again (refracture).  A failure to completely remove the medical parts. BEFORE THE PROCEDURE  Follow instructions from your health care provider about eating or drinking restrictions.  Ask your health care provider about:  Changing or stopping your regular medicines. This is especially important if you are taking diabetes medicines or blood thinners.  Taking medicines such as aspirin and ibuprofen. These medicines can thin your blood. Do not take these medicines before your procedure if your health care provider instructs you not to.  Plan to have someone take you home after the procedure.  If you go home right after the procedure, plan to have someone with you for 24 hours. PROCEDURE  You will lie on an exam table.  Several monitors will be connected to you to keep track of your heart rate, blood pressure, and breathing during the procedure.  An IV tube will be  inserted into one of your veins.  You will be given one or more of the following:  A medicine that helps you relax (sedative).  A medicine that numbs the area (local anesthetic).  A medicine that makes you fall asleep (general anesthetic).  A medicine that is injected into your spine that numbs the area below and slightly above the injection site (spinal anesthetic).  X-rays may be taken.  The surgeon will make an incision over the area where the medical  parts are located.  The medical parts will be removed.  The incision will be closed with stitches (sutures), staples, or surgical glue.  A bandage (dressing) will be placed over the incision site to keep it clean and dry.  A splint, cast, or removable walking boot may be applied until the area heals. The procedure may vary among health care providers and hospitals. AFTER THE PROCEDURE  Your blood pressure, heart rate, breathing rate, and blood oxygen level will be monitored often until the medicines you were given have worn off.  You will stay in the recovery room until you are awake and able to drink fluids.  You may be sleepy and nauseous, and you may feel some pain.   This information is not intended to replace advice given to you by your health care provider. Make sure you discuss any questions you have with your health care provider.   Document Released: 06/18/2009 Document Revised: 01/05/2015 Document Reviewed: 08/17/2014 Elsevier Interactive Patient Education 2016 Lapel Removal, Care After Refer to this sheet in the next few weeks. These instructions provide you with information about caring for yourself after your procedure. Your health care provider may also give you more specific instructions. Your treatment has been planned according to current medical practices, but problems sometimes occur. Call your health care provider if you have any problems or questions after your procedure. WHAT TO EXPECT AFTER THE PROCEDURE After your procedure, it is common to have:  Some pain.  Nausea.  Some swelling in the area where hardware was removed. HOME CARE INSTRUCTIONS  Take medicines only as directed by your health care provider.  Follow instructions from your health care provider about:  Rest.  Physical activity.  Bearing weight. SEEK MEDICAL CARE IF:  You have lasting pain.  You are unable to perform exercises or physical activity as directed by your  health care provider. SEEK IMMEDIATE MEDICAL CARE IF:  You have severe pain.  You have a fever or chills.  You have redness, heat, swelling, or pain at the site of your incision.  You have fluid, blood, or pus coming from your incision.  You have difficulty breathing.  You cannot pass gas.  You are unable to have a bowel movement within 2 days.  You have numbness for more than 24 hours in the area where hardware was removed.   This information is not intended to replace advice given to you by your health care provider. Make sure you discuss any questions you have with your health care provider.   Document Released: 01/05/2015 Document Reviewed: 01/05/2015 Elsevier Interactive Patient Education 2016 [employer] PATIENT INSTRUCTIONS POST-ANESTHESIA  IMMEDIATELY FOLLOWING SURGERY:  Do not drive or operate machinery for the first twenty four hours after surgery.  Do not make any important decisions for twenty four hours after surgery or while taking narcotic pain medications or sedatives.  If you develop intractable nausea and vomiting or a severe headache please notify your doctor immediately.  FOLLOW-UP:  Please  make an appointment with your surgeon as instructed. You do not need to follow up with anesthesia unless specifically instructed to do so.  WOUND CARE INSTRUCTIONS (if applicable):  Keep a dry clean dressing on the anesthesia/puncture wound site if there is drainage.  Once the wound has quit draining you may leave it open to air.  Generally you should leave the bandage intact for twenty four hours unless there is drainage.  If the epidural site drains for more than 36-48 hours please call the anesthesia department.  QUESTIONS?:  Please feel free to call your physician or the hospital operator if you have any questions, and they will be happy to assist you.

## 2015-09-29 ENCOUNTER — Ambulatory Visit (HOSPITAL_COMMUNITY)
Admission: RE | Admit: 2015-09-29 | Discharge: 2015-09-29 | Disposition: A | Discharge status: Home / Self Care | Payer: PPO | Source: Ambulatory Visit | Attending: Podiatry | Admitting: Podiatry

## 2015-09-29 ENCOUNTER — Ambulatory Visit (HOSPITAL_COMMUNITY): Payer: PPO | Admitting: Anesthesiology

## 2015-09-29 ENCOUNTER — Encounter (HOSPITAL_COMMUNITY)
Admission: RE | Disposition: A | Discharge status: Home / Self Care | Payer: Self-pay | Source: Ambulatory Visit | Attending: Podiatry

## 2015-09-29 ENCOUNTER — Encounter (HOSPITAL_COMMUNITY): Payer: Self-pay | Admitting: *Deleted

## 2015-09-29 ENCOUNTER — Ambulatory Visit (HOSPITAL_COMMUNITY): Payer: PPO

## 2015-09-29 DIAGNOSIS — T84498A Other mechanical complication of other internal orthopedic devices, implants and grafts, initial encounter: Secondary | ICD-10-CM | POA: Diagnosis not present

## 2015-09-29 DIAGNOSIS — Z978 Presence of other specified devices: Secondary | ICD-10-CM | POA: Diagnosis not present

## 2015-09-29 DIAGNOSIS — L03031 Cellulitis of right toe: Secondary | ICD-10-CM | POA: Diagnosis not present

## 2015-09-29 DIAGNOSIS — I10 Essential (primary) hypertension: Secondary | ICD-10-CM | POA: Diagnosis not present

## 2015-09-29 DIAGNOSIS — K219 Gastro-esophageal reflux disease without esophagitis: Secondary | ICD-10-CM | POA: Insufficient documentation

## 2015-09-29 DIAGNOSIS — M960 Pseudarthrosis after fusion or arthrodesis: Secondary | ICD-10-CM | POA: Insufficient documentation

## 2015-09-29 DIAGNOSIS — L97519 Non-pressure chronic ulcer of other part of right foot with unspecified severity: Secondary | ICD-10-CM | POA: Diagnosis not present

## 2015-09-29 DIAGNOSIS — Z9889 Other specified postprocedural states: Secondary | ICD-10-CM

## 2015-09-29 DIAGNOSIS — M069 Rheumatoid arthritis, unspecified: Secondary | ICD-10-CM | POA: Insufficient documentation

## 2015-09-29 DIAGNOSIS — M879 Osteonecrosis, unspecified: Secondary | ICD-10-CM | POA: Diagnosis not present

## 2015-09-29 DIAGNOSIS — Z969 Presence of functional implant, unspecified: Secondary | ICD-10-CM | POA: Diagnosis not present

## 2015-09-29 DIAGNOSIS — M2031 Hallux varus (acquired), right foot: Secondary | ICD-10-CM | POA: Diagnosis not present

## 2015-09-29 DIAGNOSIS — L97511 Non-pressure chronic ulcer of other part of right foot limited to breakdown of skin: Secondary | ICD-10-CM | POA: Diagnosis not present

## 2015-09-29 DIAGNOSIS — S98131A Complete traumatic amputation of one right lesser toe, initial encounter: Secondary | ICD-10-CM | POA: Diagnosis not present

## 2015-09-29 HISTORY — PX: REMOVAL OF IMPLANT: SHX6451

## 2015-09-29 HISTORY — PX: BONE BIOPSY: SHX375

## 2015-09-29 SURGERY — REMOVAL OF IMPLANT
Anesthesia: Monitor Anesthesia Care | Laterality: Right

## 2015-09-29 MED ORDER — PROPOFOL 10 MG/ML IV BOLUS
INTRAVENOUS | Status: DC | PRN
  Administered 2015-09-29 (×2): 10 mg via INTRAVENOUS

## 2015-09-29 MED ORDER — FENTANYL CITRATE (PF) 100 MCG/2ML IJ SOLN: 25.0000 ug | INTRAMUSCULAR | Status: DC | PRN

## 2015-09-29 MED ORDER — LACTATED RINGERS IV SOLN
INTRAVENOUS | Status: DC
  Administered 2015-09-29: 08:00:00 via INTRAVENOUS

## 2015-09-29 MED ORDER — BUPIVACAINE HCL (PF) 0.5 % IJ SOLN
INTRAMUSCULAR | Status: AC
  Filled 2015-09-29: qty 30

## 2015-09-29 MED ORDER — SODIUM CHLORIDE 0.9 % IR SOLN
Status: DC | PRN
  Administered 2015-09-29: 1000 mL

## 2015-09-29 MED ORDER — ONDANSETRON HCL 4 MG/2ML IJ SOLN: 4.0000 mg | Freq: Once | INTRAMUSCULAR | Status: DC | PRN

## 2015-09-29 MED ORDER — LIDOCAINE HCL (PF) 1 % IJ SOLN
INTRAMUSCULAR | Status: AC
  Filled 2015-09-29: qty 5

## 2015-09-29 MED ORDER — MIDAZOLAM HCL 2 MG/2ML IJ SOLN
1.0000 mg | INTRAMUSCULAR | Status: DC | PRN
  Administered 2015-09-29: 2 mg via INTRAVENOUS

## 2015-09-29 MED ORDER — MIDAZOLAM HCL 2 MG/2ML IJ SOLN
INTRAMUSCULAR | Status: AC
  Filled 2015-09-29: qty 2

## 2015-09-29 MED ORDER — FENTANYL CITRATE (PF) 100 MCG/2ML IJ SOLN
25.0000 ug | INTRAMUSCULAR | Status: AC
  Administered 2015-09-29 (×2): 25 ug via INTRAVENOUS

## 2015-09-29 MED ORDER — BUPIVACAINE HCL (PF) 0.5 % IJ SOLN
INTRAMUSCULAR | Status: DC | PRN
  Administered 2015-09-29: 10 mL

## 2015-09-29 MED ORDER — PROPOFOL 10 MG/ML IV BOLUS
INTRAVENOUS | Status: AC
  Filled 2015-09-29: qty 20

## 2015-09-29 MED ORDER — FENTANYL CITRATE (PF) 100 MCG/2ML IJ SOLN
INTRAMUSCULAR | Status: AC
  Filled 2015-09-29: qty 2

## 2015-09-29 MED ORDER — LIDOCAINE HCL (PF) 1 % IJ SOLN
INTRAMUSCULAR | Status: AC
  Filled 2015-09-29: qty 30

## 2015-09-29 MED ORDER — ONDANSETRON HCL 4 MG/2ML IJ SOLN
INTRAMUSCULAR | Status: AC
  Filled 2015-09-29: qty 2

## 2015-09-29 MED ORDER — ONDANSETRON HCL 4 MG/2ML IJ SOLN
4.0000 mg | Freq: Once | INTRAMUSCULAR | Status: AC
  Administered 2015-09-29: 4 mg via INTRAVENOUS

## 2015-09-29 MED ORDER — PROPOFOL 500 MG/50ML IV EMUL
INTRAVENOUS | Status: DC | PRN
  Administered 2015-09-29: 50 ug/kg/min via INTRAVENOUS
  Administered 2015-09-29: 200 ug/kg/min via INTRAVENOUS

## 2015-09-29 MED ORDER — LIDOCAINE HCL (CARDIAC) 10 MG/ML IV SOLN
INTRAVENOUS | Status: DC | PRN
  Administered 2015-09-29: 50 mg via INTRAVENOUS

## 2015-09-29 MED ORDER — CEFAZOLIN SODIUM-DEXTROSE 2-3 GM-% IV SOLR
2.0000 g | Freq: Once | INTRAVENOUS | Status: AC
  Administered 2015-09-29: 2 g via INTRAVENOUS
  Filled 2015-09-29: qty 50

## 2015-09-29 SURGICAL SUPPLY — 35 items
BAG HAMPER (MISCELLANEOUS) ×4 IMPLANT
BANDAGE ELASTIC 4 VELCRO NS (GAUZE/BANDAGES/DRESSINGS) ×4 IMPLANT
BANDAGE ELASTIC 4 VELCRO ST LF (GAUZE/BANDAGES/DRESSINGS) ×2 IMPLANT
BANDAGE ESMARK 4X12 BL STRL LF (DISPOSABLE) ×2 IMPLANT
BLADE 15 SAFETY STRL DISP (BLADE) ×8 IMPLANT
BLADE OSC/SAG 11.5X5.5X.38 (BLADE) ×2 IMPLANT
BNDG CMPR 12X4 ELC STRL LF (DISPOSABLE) ×2
BNDG CONFORM 2 STRL LF (GAUZE/BANDAGES/DRESSINGS) ×4 IMPLANT
BNDG ESMARK 4X12 BLUE STRL LF (DISPOSABLE) ×4
BNDG GAUZE ELAST 4 BULKY (GAUZE/BANDAGES/DRESSINGS) ×4 IMPLANT
CLOTH BEACON ORANGE TIMEOUT ST (SAFETY) ×4 IMPLANT
COVER LIGHT HANDLE STERIS (MISCELLANEOUS) ×8 IMPLANT
CUFF TOURNIQUET SINGLE 18IN (TOURNIQUET CUFF) IMPLANT
DECANTER SPIKE VIAL GLASS SM (MISCELLANEOUS) ×4 IMPLANT
DRAPE OEC MINIVIEW 54X84 (DRAPES) ×4 IMPLANT
DRSG ADAPTIC 3X8 NADH LF (GAUZE/BANDAGES/DRESSINGS) ×4 IMPLANT
ELECT REM PT RETURN 9FT ADLT (ELECTROSURGICAL) ×4
ELECTRODE REM PT RTRN 9FT ADLT (ELECTROSURGICAL) ×2 IMPLANT
GAUZE SPONGE 4X4 12PLY STRL (GAUZE/BANDAGES/DRESSINGS) ×4 IMPLANT
GLOVE BIO SURGEON STRL SZ7.5 (GLOVE) ×6 IMPLANT
GLOVE BIOGEL PI IND STRL 7.0 (GLOVE) ×2 IMPLANT
GLOVE BIOGEL PI INDICATOR 7.0 (GLOVE) ×4
GLOVE ECLIPSE 6.5 STRL STRAW (GLOVE) ×2 IMPLANT
GOWN STRL REUS W/TWL LRG LVL3 (GOWN DISPOSABLE) ×10 IMPLANT
KIT ROOM TURNOVER AP CYSTO (KITS) ×4 IMPLANT
MANIFOLD NEPTUNE II (INSTRUMENTS) ×4 IMPLANT
NDL HYPO 27GX1-1/4 (NEEDLE) ×6 IMPLANT
NEEDLE HYPO 27GX1-1/4 (NEEDLE) ×8 IMPLANT
NS IRRIG 1000ML POUR BTL (IV SOLUTION) ×4 IMPLANT
PACK BASIC LIMB (CUSTOM PROCEDURE TRAY) ×4 IMPLANT
PAD ARMBOARD 7.5X6 YLW CONV (MISCELLANEOUS) ×4 IMPLANT
SET BASIN LINEN APH (SET/KITS/TRAYS/PACK) ×4 IMPLANT
SUT PROLENE 4 0 PS 2 18 (SUTURE) ×6 IMPLANT
SUT VIC AB 4-0 PS2 27 (SUTURE) ×4 IMPLANT
SYR CONTROL 10ML LL (SYRINGE) ×8 IMPLANT

## 2015-09-29 NOTE — Brief Op Note (Signed)
BRIEF OPERATIVE NOTE  DATE OF PROCEDURE 09/29/2015  SURGEON Marcheta Grammes, [place]  OR STAFF Circulator: Glory Rosebush, RN Scrub Person: Lucie Leather, CST   PREOPERATIVE DIAGNOSIS 1.  Ulceration of second toe, right foot 2.  Retained hardware in second toe, right foot 3.  Nonunion of PIPJ second toe, right foot   POSTOPERATIVE DIAGNOSIS Same  PROCEDURE 1.  Removal of implant second toe, right foot 2.  Bone biopsy second toe, right foot  ANESTHESIA Monitor Anesthesia Care   HEMOSTASIS Pneumatic ankle tourniquet set at 250 mmHg  ESTIMATED BLOOD LOSS Minimal (<5 cc)  MATERIALS USED None  INJECTABLES 0.5% Marcaine plain  PATHOLOGY 1.  Implant second toe, right foot for gross evaluation 2.  Bone biopsy second toe, right foot 3.  Bone for culture second toe, right foot  COMPLICATIONS None

## 2015-09-29 NOTE — Anesthesia Procedure Notes (Addendum)
Procedure Name: MAC Date/Time: 09/29/2015 9:37 AM Performed by: Vista Deck Pre-anesthesia Checklist: Patient identified, Emergency Drugs available, Suction available, Timeout performed and Patient being monitored Patient Re-evaluated:Patient Re-evaluated prior to inductionOxygen Delivery Method: Nasal Cannula   Procedure Name: MAC Date/Time: 09/29/2015 7:48 AM Performed by: Vista Deck Pre-anesthesia Checklist: Patient identified, Emergency Drugs available, Suction available, Timeout performed and Patient being monitored Patient Re-evaluated:Patient Re-evaluated prior to inductionOxygen Delivery Method: Non-rebreather mask

## 2015-09-29 NOTE — Anesthesia Postprocedure Evaluation (Signed)
Anesthesia Post Note  Patient: [name]  Procedure(s) Performed: Procedure(s) (LRB): REMOVAL OF IMPLANT 2ND TOE RIGHT FOOT (Right) BONE BIOPSY AND BONE CULTURE SECOND TOE RIGHT FOOT  Patient location during evaluation: PACU Anesthesia Type: MAC Level of consciousness: awake and alert Pain management: pain level controlled Vital Signs Assessment: post-procedure vital signs reviewed and stable Respiratory status: spontaneous breathing Cardiovascular status: blood pressure returned to baseline Anesthetic complications: no    Last Vitals:  Filed Vitals:   09/29/15 1000 09/29/15 1015  BP: 107/59 124/77  Pulse: 70 69  Temp:    Resp: 21 18    Last Pain:  Filed Vitals:   09/29/15 1022  PainSc: 0-No pain                 Jordi Lacko

## 2015-09-29 NOTE — Op Note (Signed)
OPERATIVE NOTE  DATE OF PROCEDURE 09/29/2015  SURGEON Marcheta Grammes, [place]  OR STAFF Circulator: Glory Rosebush, RN Scrub Person: Lucie Leather, CST   PREOPERATIVE DIAGNOSIS 1.  Ulceration of second toe, right foot 2.  Retained hardware in second toe, right foot 3.  Nonunion of PIPJ second toe, right foot   POSTOPERATIVE DIAGNOSIS Same  PROCEDURE 1.  Removal of implant second toe, right foot 2.  Bone biopsy second toe, right foot  ANESTHESIA Monitor Anesthesia Care   HEMOSTASIS Pneumatic ankle tourniquet set at 250 mmHg  ESTIMATED BLOOD LOSS Minimal (<5 cc)  MATERIALS USED None  INJECTABLES 0.5% Marcaine plain  PATHOLOGY 1.  Implant second toe, right foot for gross evaluation 2.  Bone biopsy second toe, right foot 3.  Bone for culture second toe, right foot  COMPLICATIONS None  INDICATIONS:  Ulceration along the medial aspect of the right second toe with nonunion of the PIPJ and loosening of the digital implant concerning for infection.  DESCRIPTION OF THE PROCEDURE:  The patient was brought to the operating room and placed on the operative table in the supine position.  A pneumatic ankle tourniquet was applied to the operative extremity.  Following sedation, the surgical site was anesthetized with 0.5% Marcaine plain.  The foot was then prepped, scrubbed, and draped in the usual sterile technique.  The foot was elevated, exsanguinated and the pneumatic ankle tourniquet inflated to 250 mmHg.    Attention was directed to the medial aspect of the right second toe.  A full-thickness ulceration was noted along the medial aspect of the PIPJ.  The ulceration measured 0.5 cm in length by 0.5 cm in width with a granular bed.  The appearance of the ulceration is improved from yesterday.  Attention was directed to the dorsal aspect of the right second toe.  A linear longitudinal incision was made overlying the proximal interphalangeal joint.  Dissection was continued  deep down to the level of the proximal interphalangeal joint.  A transverse tenotomy capsulotomy was performed.  Fibrous tissue was encountered at the site of the proximal interphalangeal joint consistent with radiographic evidence of nonunion.  The fibrous tissue was debrided using a rongeur.  The implant was identified and removed without difficulty.  The implant was sent to pathology for gross evaluation.  The underlying was evaluated.  The bone underlying the ulceration was found to be firm and of normal color.  Using a power bone saw the distal aspect of the proximal phalanx was resected converting the original arthrodesis site to an arthroplasty.  The bone was divided.  A portion of the bone was sent to pathology for evaluation to rule out osteomyelitis.  The other portion of bone was sent to microbiology for culture and sensitivity.  The surgical wound was irrigated with copious amounts of sterile irrigant.  No hardware was implanted due to preoperative concerned with infection.  The extensor tendon was reapproximated using 4-0 Vicryl in a simple suture technique.  The skin was reapproximated using 4-0 Prolene in a simple suture technique.  A sterile compressive dressing was applied to the right foot.  The pneumatic ankle tourniquet was deflated and a prompt hyperemic response was noted to all digits of the right foot.   The patient tolerated the procedure well.  The patient was then transferred to PACU with vital signs stable and vascular status intact to all toes of the operative foot.

## 2015-09-29 NOTE — Discharge Instructions (Addendum)

## 2015-09-29 NOTE — Anesthesia Preprocedure Evaluation (Signed)
Anesthesia Evaluation  Patient identified by MRN, date of birth, ID band Patient awake    Reviewed: Allergy & Precautions, NPO status , Patient's Chart, lab work & pertinent test results  History of Anesthesia Complications (+) PONV and history of anesthetic complications  Airway Mallampati: II  TM Distance: >3 FB     Dental  (+) Teeth Intact   Pulmonary shortness of breath and with exertion,    breath sounds clear to auscultation       Cardiovascular hypertension, Pt. on medications  Rhythm:Regular Rate:Normal     Neuro/Psych    GI/Hepatic GERD  Controlled and Medicated,  Endo/Other  Hypothyroidism   Renal/GU      Musculoskeletal  (+) Arthritis , Rheumatoid disorders,    Abdominal   Peds  Hematology   Anesthesia Other Findings   Reproductive/Obstetrics                             Anesthesia Physical Anesthesia Plan  ASA: III  Anesthesia Plan: MAC   Post-op Pain Management:    Induction: Intravenous  Airway Management Planned: Simple Face Mask  Additional Equipment:   Intra-op Plan:   Post-operative Plan:   Informed Consent: I have reviewed the patients History and Physical, chart, labs and discussed the procedure including the risks, benefits and alternatives for the proposed anesthesia with the patient or authorized representative who has indicated his/her understanding and acceptance.     Plan Discussed with:   Anesthesia Plan Comments:         Anesthesia Quick Evaluation

## 2015-09-29 NOTE — H&P (Signed)
HISTORY AND PHYSICAL INTERVAL NOTE:  09/29/2015  8:33 AM  [name]  has presented today for surgery, with the diagnosis of ulcer right foot, cellulitis of toe right foot, retained hardware, non-union of PIP 2nd toe right foot.  The various methods of treatment have been discussed with the patient.  No guarantees were given.  After consideration of risks, benefits and other options for treatment, the patient has consented to surgery.  I have reviewed the patients' chart and labs.    Patient Vitals for the past 24 hrs:  BP Temp Temp src Pulse Resp SpO2 Height Weight  09/29/15 0810 116/62 mmHg - - - 15 97 % - -  09/29/15 0805 (!) 113/57 mmHg - - - 11 100 % - -  09/29/15 0800 118/60 mmHg - - - 14 100 % - -  09/29/15 0755 120/66 mmHg - - - (!) 28 100 % - -  09/29/15 0750 120/62 mmHg - - - 17 100 % - -  09/29/15 0745 120/66 mmHg - - - 12 100 % - -  09/29/15 0743 120/66 mmHg 97.9 F (36.6 C) Oral 77 18 100 % 4\' 10"  (1.473 m) 121 lb (54.885 kg)    A history and physical examination was performed in my office.  The patient was reexamined.  There have been no changes to this history and physical examination.  Marcheta Grammes, DPM

## 2015-09-29 NOTE — Transfer of Care (Signed)
Immediate Anesthesia Transfer of Care Note  Patient: [name]  Procedure(s) Performed: Procedure(s): REMOVAL OF IMPLANT 2ND TOE RIGHT FOOT (Right) REPAIR OF HAMMER TOE DEFORMITY SECOND TOE RIGHT FOOT BONE BIOPSY AND BONE CULTURE  Patient Location: PACU  Anesthesia Type:MAC  Level of Consciousness: sedated and patient cooperative  Airway & Oxygen Therapy: Patient Spontanous Breathing and Patient connected to nasal cannula oxygen  Post-op Assessment: Report given to RN, Post -op Vital signs reviewed and stable and Patient moving all extremities  Post vital signs: Reviewed and stable   Complications: No apparent anesthesia complications

## 2015-09-30 ENCOUNTER — Encounter (HOSPITAL_COMMUNITY): Payer: Self-pay | Admitting: Podiatry

## 2015-10-03 LAB — TISSUE CULTURE

## 2015-10-08 DIAGNOSIS — M960 Pseudarthrosis after fusion or arthrodesis: Secondary | ICD-10-CM | POA: Diagnosis not present

## 2015-10-08 DIAGNOSIS — L97511 Non-pressure chronic ulcer of other part of right foot limited to breakdown of skin: Secondary | ICD-10-CM | POA: Diagnosis not present

## 2015-10-08 DIAGNOSIS — Z969 Presence of functional implant, unspecified: Secondary | ICD-10-CM | POA: Diagnosis not present

## 2015-10-08 DIAGNOSIS — M545 Low back pain: Secondary | ICD-10-CM | POA: Diagnosis not present

## 2015-10-21 DIAGNOSIS — M545 Low back pain: Secondary | ICD-10-CM | POA: Diagnosis not present

## 2015-10-21 DIAGNOSIS — M810 Age-related osteoporosis without current pathological fracture: Secondary | ICD-10-CM | POA: Diagnosis not present

## 2015-10-21 DIAGNOSIS — Z79899 Other long term (current) drug therapy: Secondary | ICD-10-CM | POA: Diagnosis not present

## 2015-10-21 DIAGNOSIS — M0579 Rheumatoid arthritis with rheumatoid factor of multiple sites without organ or systems involvement: Secondary | ICD-10-CM | POA: Diagnosis not present

## 2015-11-01 DIAGNOSIS — M5126 Other intervertebral disc displacement, lumbar region: Secondary | ICD-10-CM | POA: Diagnosis not present

## 2015-11-01 DIAGNOSIS — M5136 Other intervertebral disc degeneration, lumbar region: Secondary | ICD-10-CM | POA: Diagnosis not present

## 2015-11-02 ENCOUNTER — Other Ambulatory Visit (HOSPITAL_COMMUNITY): Payer: Self-pay | Admitting: Sports Medicine

## 2015-11-02 DIAGNOSIS — M545 Low back pain, unspecified: Secondary | ICD-10-CM

## 2015-11-02 DIAGNOSIS — M79605 Pain in left leg: Principal | ICD-10-CM

## 2015-11-05 DIAGNOSIS — L97511 Non-pressure chronic ulcer of other part of right foot limited to breakdown of skin: Secondary | ICD-10-CM | POA: Diagnosis not present

## 2015-11-05 DIAGNOSIS — M960 Pseudarthrosis after fusion or arthrodesis: Secondary | ICD-10-CM | POA: Diagnosis not present

## 2015-11-05 DIAGNOSIS — Z969 Presence of functional implant, unspecified: Secondary | ICD-10-CM | POA: Diagnosis not present

## 2015-11-12 ENCOUNTER — Ambulatory Visit (HOSPITAL_COMMUNITY)
Admission: RE | Admit: 2015-11-12 | Discharge: 2015-11-12 | Disposition: A | Discharge status: Home / Self Care | Payer: PPO | Source: Ambulatory Visit | Attending: Sports Medicine | Admitting: Sports Medicine

## 2015-11-12 DIAGNOSIS — M545 Low back pain, unspecified: Secondary | ICD-10-CM

## 2015-11-12 DIAGNOSIS — M4856XA Collapsed vertebra, not elsewhere classified, lumbar region, initial encounter for fracture: Secondary | ICD-10-CM | POA: Insufficient documentation

## 2015-11-12 DIAGNOSIS — M5137 Other intervertebral disc degeneration, lumbosacral region: Secondary | ICD-10-CM | POA: Insufficient documentation

## 2015-11-12 DIAGNOSIS — M4806 Spinal stenosis, lumbar region: Secondary | ICD-10-CM | POA: Insufficient documentation

## 2015-11-12 DIAGNOSIS — M5136 Other intervertebral disc degeneration, lumbar region: Secondary | ICD-10-CM | POA: Insufficient documentation

## 2015-11-12 DIAGNOSIS — M4807 Spinal stenosis, lumbosacral region: Secondary | ICD-10-CM | POA: Insufficient documentation

## 2015-11-12 DIAGNOSIS — M79605 Pain in left leg: Secondary | ICD-10-CM

## 2015-11-15 DIAGNOSIS — M5136 Other intervertebral disc degeneration, lumbar region: Secondary | ICD-10-CM | POA: Diagnosis not present

## 2015-11-15 DIAGNOSIS — M8000XA Age-related osteoporosis with current pathological fracture, unspecified site, initial encounter for fracture: Secondary | ICD-10-CM | POA: Diagnosis not present

## 2015-11-15 DIAGNOSIS — M5126 Other intervertebral disc displacement, lumbar region: Secondary | ICD-10-CM | POA: Diagnosis not present

## 2015-11-25 DIAGNOSIS — M4150 Other secondary scoliosis, site unspecified: Secondary | ICD-10-CM | POA: Diagnosis not present

## 2015-11-25 DIAGNOSIS — M4317 Spondylolisthesis, lumbosacral region: Secondary | ICD-10-CM | POA: Diagnosis not present

## 2015-11-25 DIAGNOSIS — M4727 Other spondylosis with radiculopathy, lumbosacral region: Secondary | ICD-10-CM | POA: Diagnosis not present

## 2015-11-25 DIAGNOSIS — M4806 Spinal stenosis, lumbar region: Secondary | ICD-10-CM | POA: Diagnosis not present

## 2015-12-02 DIAGNOSIS — E782 Mixed hyperlipidemia: Secondary | ICD-10-CM | POA: Diagnosis not present

## 2015-12-02 DIAGNOSIS — E039 Hypothyroidism, unspecified: Secondary | ICD-10-CM | POA: Diagnosis not present

## 2015-12-06 DIAGNOSIS — M069 Rheumatoid arthritis, unspecified: Secondary | ICD-10-CM | POA: Diagnosis not present

## 2015-12-06 DIAGNOSIS — J45909 Unspecified asthma, uncomplicated: Secondary | ICD-10-CM | POA: Diagnosis not present

## 2015-12-06 DIAGNOSIS — I10 Essential (primary) hypertension: Secondary | ICD-10-CM | POA: Diagnosis not present

## 2015-12-06 DIAGNOSIS — E039 Hypothyroidism, unspecified: Secondary | ICD-10-CM | POA: Diagnosis not present

## 2015-12-09 DIAGNOSIS — M4317 Spondylolisthesis, lumbosacral region: Secondary | ICD-10-CM | POA: Diagnosis not present

## 2015-12-09 DIAGNOSIS — M4727 Other spondylosis with radiculopathy, lumbosacral region: Secondary | ICD-10-CM | POA: Diagnosis not present

## 2015-12-09 DIAGNOSIS — M4806 Spinal stenosis, lumbar region: Secondary | ICD-10-CM | POA: Diagnosis not present

## 2015-12-09 DIAGNOSIS — M4150 Other secondary scoliosis, site unspecified: Secondary | ICD-10-CM | POA: Diagnosis not present

## 2015-12-20 ENCOUNTER — Other Ambulatory Visit (HOSPITAL_COMMUNITY): Payer: Self-pay | Admitting: Neurological Surgery

## 2015-12-20 DIAGNOSIS — M810 Age-related osteoporosis without current pathological fracture: Secondary | ICD-10-CM

## 2015-12-23 ENCOUNTER — Ambulatory Visit (HOSPITAL_COMMUNITY)
Admission: RE | Admit: 2015-12-23 | Discharge: 2015-12-23 | Disposition: A | Discharge status: Home / Self Care | Payer: PPO | Source: Ambulatory Visit | Attending: Neurological Surgery | Admitting: Neurological Surgery

## 2015-12-23 DIAGNOSIS — M810 Age-related osteoporosis without current pathological fracture: Secondary | ICD-10-CM | POA: Insufficient documentation

## 2015-12-23 DIAGNOSIS — Z780 Asymptomatic menopausal state: Secondary | ICD-10-CM | POA: Diagnosis not present

## 2016-01-27 DIAGNOSIS — Z79899 Other long term (current) drug therapy: Secondary | ICD-10-CM | POA: Diagnosis not present

## 2016-01-27 DIAGNOSIS — M0579 Rheumatoid arthritis with rheumatoid factor of multiple sites without organ or systems involvement: Secondary | ICD-10-CM | POA: Diagnosis not present

## 2016-02-17 ENCOUNTER — Other Ambulatory Visit: Payer: Self-pay | Admitting: Neurological Surgery

## 2016-02-17 DIAGNOSIS — M4727 Other spondylosis with radiculopathy, lumbosacral region: Secondary | ICD-10-CM | POA: Diagnosis not present

## 2016-02-29 DIAGNOSIS — J069 Acute upper respiratory infection, unspecified: Secondary | ICD-10-CM | POA: Diagnosis not present

## 2016-03-06 ENCOUNTER — Encounter (HOSPITAL_COMMUNITY)
Admission: RE | Admit: 2016-03-06 | Discharge: 2016-03-06 | Disposition: A | Discharge status: Home / Self Care | Payer: PPO | Source: Ambulatory Visit | Attending: Neurological Surgery | Admitting: Neurological Surgery

## 2016-03-06 ENCOUNTER — Encounter (HOSPITAL_COMMUNITY): Payer: Self-pay

## 2016-03-06 DIAGNOSIS — Z01812 Encounter for preprocedural laboratory examination: Secondary | ICD-10-CM | POA: Insufficient documentation

## 2016-03-06 DIAGNOSIS — J45909 Unspecified asthma, uncomplicated: Secondary | ICD-10-CM | POA: Insufficient documentation

## 2016-03-06 DIAGNOSIS — Z853 Personal history of malignant neoplasm of breast: Secondary | ICD-10-CM | POA: Diagnosis not present

## 2016-03-06 DIAGNOSIS — Z79899 Other long term (current) drug therapy: Secondary | ICD-10-CM | POA: Diagnosis not present

## 2016-03-06 DIAGNOSIS — I10 Essential (primary) hypertension: Secondary | ICD-10-CM | POA: Diagnosis not present

## 2016-03-06 DIAGNOSIS — Z0183 Encounter for blood typing: Secondary | ICD-10-CM | POA: Insufficient documentation

## 2016-03-06 DIAGNOSIS — Z01818 Encounter for other preprocedural examination: Secondary | ICD-10-CM | POA: Insufficient documentation

## 2016-03-06 DIAGNOSIS — M810 Age-related osteoporosis without current pathological fracture: Secondary | ICD-10-CM | POA: Diagnosis not present

## 2016-03-06 DIAGNOSIS — K219 Gastro-esophageal reflux disease without esophagitis: Secondary | ICD-10-CM | POA: Diagnosis not present

## 2016-03-06 DIAGNOSIS — Z7983 Long term (current) use of bisphosphonates: Secondary | ICD-10-CM | POA: Insufficient documentation

## 2016-03-06 HISTORY — DX: Dysphagia, unspecified: R13.10

## 2016-03-06 LAB — BASIC METABOLIC PANEL
ANION GAP: 5 (ref 5–15)
BUN: 16 mg/dL (ref 6–20)
CALCIUM: 9.2 mg/dL (ref 8.9–10.3)
CO2: 27 mmol/L (ref 22–32)
CREATININE: 0.84 mg/dL (ref 0.44–1.00)
Chloride: 107 mmol/L (ref 101–111)
GFR calc Af Amer: >60 mL/min (ref >60)
GLUCOSE: 91 mg/dL (ref 65–99)
Potassium: 4.1 mmol/L (ref 3.5–5.1)
Sodium: 139 mmol/L (ref 135–145)

## 2016-03-06 LAB — CBC
HCT: 40.6 % (ref 36.0–46.0)
Hemoglobin: 12.7 g/dL (ref 12.0–15.0)
MCH: 31 pg (ref 26.0–34.0)
MCHC: 31.3 g/dL (ref 30.0–36.0)
MCV: 99 fL (ref 78.0–100.0)
PLATELETS: 145 10*3/uL — AB (ref 150–400)
RBC: 4.1 MIL/uL (ref 3.87–5.11)
RDW: 15.1 % (ref 11.5–15.5)
WBC: 5.1 10*3/uL (ref 4.0–10.5)

## 2016-03-06 LAB — SURGICAL PCR SCREEN
MRSA, PCR: NEGATIVE
Staphylococcus aureus: POSITIVE — AB

## 2016-03-06 NOTE — Progress Notes (Addendum)
Anesthesia PAT Evaluation: Patient is a 75 year old female scheduled for L4-5, L5-S1 laminotomy/forominotomy on 03/13/16 (first case) by Dr. Cyndy Freeze.  History includes RA (Dr. Percell Boston), non-smoker, scoliosis, GERD, HTN, osteoporosis, reactive airway disease, dysphagia (solids) s/p esophageal dilation, left breast cancer s/p lumpectomy with sentinel LN biopsy '07, hysterectomy, multi-nodular goiter (non-malignant left FNA, but with scant specimen '14; followed by Dr. Harlow Asa).  I was asked to see patient in PAT due to recent evaluation for URI. She reportedly saw her PCP Dr. Delphina Cahill on 02/29/16 for sinus congestion. She doesn't think that she's had a fever. He recommended Mucinex and Zyrtec. She feels she is getting better, but still has an intermittent productive cough with clear to yellowish sputum. Sometimes, secretions difficult to cough up.   Meds include albuterol, Fosamax, Caltrate with D, Zyrtec, folic acid, Hydomet, lisinopril, magnesium, methotrexate, Prilosec, KCL, quinine tonic water. Her rheumatologist does not hold methotrexate prior to surgeries, so he would defer to surgeon Lorriane Shire at Dr. Hewitt Shorts office notified and will let patient know if she needs to hold before surgery.)  PAT Vitals: BP 164/77, HR 85, RR 18, T 36.1C, O2 sat 98%. Heart RRR, no murmur noted. Lungs initially had scattered rhonchi without wheezing noted. I had her cough and lungs sounds were subsequently clear.    03/06/16 EKG: NSR. Negative T wave in III. Last echo in 2008 (for chemo) showed normal LV systolic function.   09/11/14 PFTs: FVC 2.18 (103%), FEV1 1.34 (85%), DLCO unc 16.83 (104%).   10/08/14 Thyroid U/S: IMPRESSION: Similar findings of multi nodular goiter. No definitive new or enlarging thyroid nodules. None of the discretely measured thyroid nodules currently meet imaging criteria to recommend percutaneous sampling.  Preoperative labs noted. BMET WNL. WBC 5.1. H/H 12.7/40.6, PLT 145K. Glucose 91. She  reports recent thyroid studies to evaluate for hyperthyroidism (normal TSH in 2015).  Surgery is still a week away. She feels she is recovering from her URI, but still has an intermittent cough with what she feels is sinus drainage. She will continue Mucinex. I advised she use her albuterol at least once a day (reportedly prescribed as BID PRN) for the next few days until her cough/secretions clear. If she has a persistent productive cough or worsening symptoms by 03/09/16 then I advised that she contact Dr. Nevada Crane for consideration of re-evaluation prior to surgery. We discussed that if she presents with fever, abnormal lungs sounds, etc then her surgery could be canceled. CXR not done today because she did not appear toxic, she felt symptoms improving, and breath sounds cleared with cough. I have left a voice message with Janett Billow at Dr. Hewitt Shorts office regarding recent URI.  Will leave chart for follow-up results of recent thyroid test results. (Update 03/08/16 5:15 PM: Still no call or fax back from Dr. Juel Burrow office regarding thyroid study results. Unclear at this point if I will be able to obtain those results before 03/13/16. I have updated anesthesiologist Dr. Therisa Doyne regarding this and recent evaluation for URI. Will continue to attempt to get records, but if no obvious signs or symptoms of hyperthyroidism then he did not feel it would interfere with surgery plans. I will plan to update again if any additional information obtained.)   George Hugh [facility] Short Stay Center/Anesthesiology Phone [phone] 03/06/2016 2:31 PM  Addendum: I was able to speak with nursing staff Caryl Pina) at Dr. Juel Burrow office today. He is out of town until next week. Apparently, patient did contact their office  on Monday and was prescribed a Zpack. In regards to her thyroid studies, as of 12/02/15 her TSH was low at 0.11. Reportedly, by notes Dr. Nevada Crane did not necessarily feel she had to be re-evaluated by Dr. Harlow Asa  but did forward him her TSH results. He did not recommend any specific treatment at that time with plans for three month follow-up. I updated anesthesiologist Dr. Landry Dyke regarding TSH results and PCP recommendations from 11/3015.  As above, if no obvious S/S hyperthyroidism and URI symptoms improved/resolved then would anticipate that she can proceed as planned.  George Hugh [facility] Short Stay Center/Anesthesiology Phone [phone] 03/10/2016 10:16 AM

## 2016-03-06 NOTE — Pre-Procedure Instructions (Signed)
Shynice Woodhouse Maslow  03/06/2016     Your procedure is scheduled on : Monday March 13, 2016 at 7:30 AM.  Report to [facility] Admitting at 5:30 AM.  Call this number if you have problems the morning of surgery: [phone]    Remember:  Do not eat food or drink liquids after midnight.  Take these medicines the morning of surgery with A SIP OF WATER : Albuterol inhaler if needed (bring inhaler with you), Cetirizine (Zyrtec) if needed, Omeprazole (Prilosec)   Stop taking any vitamins, herbal medications/supplements, NSAIDs, Ibuprofen, Advil, Motrin, Aleve/Naproxen, etc on today   Please check with your Rheumatologist about Methotrexate   Do not wear jewelry, make-up or nail polish.  Do not wear lotions, powders, or perfumes.    Do not shave 48 hours prior to surgery.    Do not bring valuables to the hospital.  [facility] is not responsible for any belongings or valuables.  Contacts, dentures or bridgework may not be worn into surgery.  Leave your suitcase in the car.  After surgery it may be brought to your room.  For patients admitted to the hospital, discharge time will be determined by your treatment team.  Patients discharged the day of surgery will not be allowed to drive home.   Name and phone number of your driver:    Special instructions:  Shower using CHG soap the night before and the morning of your surgery  Please read over the following fact sheets that you were given.

## 2016-03-06 NOTE — Progress Notes (Signed)
Nurse called prescription in for Mupirocin in to [facility], and then called and informed patient of positive PCR results. Patient verbalized understanding, and then requested to have Dr. Hewitt Shorts number. Nurse provided number.

## 2016-03-06 NOTE — Progress Notes (Signed)
PCP Dr. Nevada Crane  Patient denied having cardiac issues, but did inform Nurse that she developed a cold last week and saw Dr. Nevada Crane for this, and he prescribed her some Mucinex. Patient informed Nurse that she has been coughing up some clear, yellow sputum, but denied having a fever.  Rheumatologist is Percell Boston. Patient takes 10 tablets of Methotrexate every Monday. Nurse called Dr. Scharlene Gloss office to inquire about Methotrexate and Nurse at office informed Nurse that Dr. Jefm Bryant does not require his patients to stop Methotrexate before surgery, but it was up to the patient's surgeon if patient needed to stop. Nurse then called Lorriane Shire at Dr. Hewitt Shorts office and Lorriane Shire stated she would call patient and let her know if she needed to stop.   Will inform [place], [place] of this.

## 2016-03-10 MED ORDER — CEFAZOLIN SODIUM-DEXTROSE 2-4 GM/100ML-% IV SOLN
2.0000 g | INTRAVENOUS | Status: AC
  Administered 2016-03-13: 2 g via INTRAVENOUS
  Filled 2016-03-10: qty 100

## 2016-03-12 NOTE — Anesthesia Preprocedure Evaluation (Addendum)
Anesthesia Evaluation  Patient identified by MRN, date of birth, ID band Patient awake    Reviewed: Allergy & Precautions, NPO status , Patient's Chart, lab work & pertinent test results  Airway Mallampati: II  TM Distance: >3 FB Neck ROM: Full    Dental  (+) Teeth Intact, Dental Advisory Given   Pulmonary    breath sounds clear to auscultation       Cardiovascular hypertension, Pt. on medications  Rhythm:Regular Rate:Normal     Neuro/Psych negative neurological ROS  negative psych ROS   GI/Hepatic Neg liver ROS, GERD  Medicated,  Endo/Other  Hypothyroidism   Renal/GU negative Renal ROS  negative genitourinary   Musculoskeletal  (+) Arthritis , Rheumatoid disorders,    Abdominal Normal abdominal exam  (+)   Peds negative pediatric ROS (+)  Hematology negative hematology ROS (+)   Anesthesia Other Findings   Reproductive/Obstetrics negative OB ROS                            Lab Results  Component Value Date   WBC 5.1 03/06/2016   HGB 12.7 03/06/2016   HCT 40.6 03/06/2016   MCV 99.0 03/06/2016   PLT 145* 03/06/2016   Lab Results  Component Value Date   CREATININE 0.84 03/06/2016   BUN 16 03/06/2016   NA 139 03/06/2016   K 4.1 03/06/2016   CL 107 03/06/2016   CO2 27 03/06/2016   No results found for: INR, PROTIME  03/2016 EKG: NSR  Anesthesia Physical Anesthesia Plan  ASA: II  Anesthesia Plan: General   Post-op Pain Management:    Induction: Intravenous  Airway Management Planned: Oral ETT  Additional Equipment:   Intra-op Plan:   Post-operative Plan: Extubation in OR  Informed Consent: I have reviewed the patients History and Physical, chart, labs and discussed the procedure including the risks, benefits and alternatives for the proposed anesthesia with the patient or authorized representative who has indicated his/her understanding and acceptance.   Dental  advisory given  Plan Discussed with:   Anesthesia Plan Comments:         Anesthesia Quick Evaluation

## 2016-03-13 ENCOUNTER — Ambulatory Visit (HOSPITAL_COMMUNITY): Payer: PPO | Admitting: Anesthesiology

## 2016-03-13 ENCOUNTER — Ambulatory Visit (HOSPITAL_COMMUNITY)
Admission: RE | Admit: 2016-03-13 | Discharge: 2016-03-14 | Disposition: A | Discharge status: Home Health | Payer: PPO | Source: Ambulatory Visit | Attending: Neurological Surgery | Admitting: Neurological Surgery

## 2016-03-13 ENCOUNTER — Encounter (HOSPITAL_COMMUNITY): Payer: Self-pay | Admitting: Critical Care Medicine

## 2016-03-13 ENCOUNTER — Ambulatory Visit (HOSPITAL_COMMUNITY): Payer: PPO

## 2016-03-13 ENCOUNTER — Encounter (HOSPITAL_COMMUNITY)
Admission: RE | Disposition: A | Discharge status: Home / Self Care | Payer: Self-pay | Source: Ambulatory Visit | Attending: Neurological Surgery

## 2016-03-13 ENCOUNTER — Ambulatory Visit (HOSPITAL_COMMUNITY): Payer: PPO | Admitting: Vascular Surgery

## 2016-03-13 DIAGNOSIS — R269 Unspecified abnormalities of gait and mobility: Secondary | ICD-10-CM | POA: Diagnosis not present

## 2016-03-13 DIAGNOSIS — M4187 Other forms of scoliosis, lumbosacral region: Secondary | ICD-10-CM | POA: Insufficient documentation

## 2016-03-13 DIAGNOSIS — M5416 Radiculopathy, lumbar region: Secondary | ICD-10-CM | POA: Diagnosis present

## 2016-03-13 DIAGNOSIS — Z853 Personal history of malignant neoplasm of breast: Secondary | ICD-10-CM | POA: Insufficient documentation

## 2016-03-13 DIAGNOSIS — M4726 Other spondylosis with radiculopathy, lumbar region: Secondary | ICD-10-CM | POA: Diagnosis not present

## 2016-03-13 DIAGNOSIS — M069 Rheumatoid arthritis, unspecified: Secondary | ICD-10-CM | POA: Insufficient documentation

## 2016-03-13 DIAGNOSIS — R262 Difficulty in walking, not elsewhere classified: Secondary | ICD-10-CM | POA: Diagnosis not present

## 2016-03-13 DIAGNOSIS — M5417 Radiculopathy, lumbosacral region: Secondary | ICD-10-CM | POA: Diagnosis not present

## 2016-03-13 DIAGNOSIS — I10 Essential (primary) hypertension: Secondary | ICD-10-CM | POA: Insufficient documentation

## 2016-03-13 DIAGNOSIS — M4727 Other spondylosis with radiculopathy, lumbosacral region: Secondary | ICD-10-CM | POA: Diagnosis not present

## 2016-03-13 DIAGNOSIS — M4806 Spinal stenosis, lumbar region: Secondary | ICD-10-CM | POA: Diagnosis not present

## 2016-03-13 DIAGNOSIS — M4186 Other forms of scoliosis, lumbar region: Secondary | ICD-10-CM | POA: Diagnosis not present

## 2016-03-13 DIAGNOSIS — G629 Polyneuropathy, unspecified: Secondary | ICD-10-CM | POA: Diagnosis not present

## 2016-03-13 DIAGNOSIS — K219 Gastro-esophageal reflux disease without esophagitis: Secondary | ICD-10-CM | POA: Diagnosis not present

## 2016-03-13 DIAGNOSIS — M4307 Spondylolysis, lumbosacral region: Secondary | ICD-10-CM | POA: Diagnosis not present

## 2016-03-13 DIAGNOSIS — Z419 Encounter for procedure for purposes other than remedying health state, unspecified: Secondary | ICD-10-CM

## 2016-03-13 DIAGNOSIS — E039 Hypothyroidism, unspecified: Secondary | ICD-10-CM | POA: Diagnosis not present

## 2016-03-13 DIAGNOSIS — R531 Weakness: Secondary | ICD-10-CM | POA: Insufficient documentation

## 2016-03-13 DIAGNOSIS — Z4789 Encounter for other orthopedic aftercare: Secondary | ICD-10-CM | POA: Diagnosis not present

## 2016-03-13 HISTORY — PX: LUMBAR LAMINECTOMY/DECOMPRESSION MICRODISCECTOMY: SHX5026

## 2016-03-13 SURGERY — LUMBAR LAMINECTOMY/DECOMPRESSION MICRODISCECTOMY 2 LEVELS
Anesthesia: General | Site: Spine Lumbar | Laterality: Left

## 2016-03-13 MED ORDER — HYDROMORPHONE HCL 1 MG/ML IJ SOLN: 0.2500 mg | INTRAMUSCULAR | Status: DC | PRN

## 2016-03-13 MED ORDER — KETOROLAC TROMETHAMINE 30 MG/ML IJ SOLN
INTRAMUSCULAR | Status: DC | PRN
Start: 2016-03-13 — End: 2016-03-13
  Administered 2016-03-13: 30 mg

## 2016-03-13 MED ORDER — THROMBIN 5000 UNITS EX SOLR
CUTANEOUS | Status: DC | PRN
  Administered 2016-03-13 (×2): 5000 [IU] via TOPICAL

## 2016-03-13 MED ORDER — ALBUTEROL SULFATE HFA 108 (90 BASE) MCG/ACT IN AERS: 1.0000 | INHALATION_SPRAY | Freq: Four times a day (QID) | RESPIRATORY_TRACT | Status: DC | PRN

## 2016-03-13 MED ORDER — LACTATED RINGERS IV SOLN: INTRAVENOUS | Status: DC

## 2016-03-13 MED ORDER — FLEET ENEMA 7-19 GM/118ML RE ENEM: 1.0000 | ENEMA | Freq: Once | RECTAL | Status: DC | PRN

## 2016-03-13 MED ORDER — CELECOXIB 200 MG PO CAPS: 200.0000 mg | ORAL_CAPSULE | Freq: Two times a day (BID) | ORAL | Status: DC

## 2016-03-13 MED ORDER — LORATADINE 10 MG PO TABS
10.0000 mg | ORAL_TABLET | Freq: Every day | ORAL | Status: DC
Start: 2016-03-14 — End: 2016-03-14
  Administered 2016-03-14: 10 mg via ORAL
  Filled 2016-03-13 (×2): qty 1

## 2016-03-13 MED ORDER — DEXAMETHASONE SODIUM PHOSPHATE 10 MG/ML IJ SOLN
INTRAMUSCULAR | Status: AC
  Filled 2016-03-13: qty 1

## 2016-03-13 MED ORDER — METHOTREXATE 2.5 MG PO TABS
25.0000 mg | ORAL_TABLET | ORAL | Status: DC
  Administered 2016-03-14: 25 mg via ORAL
  Filled 2016-03-13: qty 10

## 2016-03-13 MED ORDER — LACTATED RINGERS IV SOLN
INTRAVENOUS | Status: DC | PRN
  Administered 2016-03-13: 07:00:00 via INTRAVENOUS

## 2016-03-13 MED ORDER — BISACODYL 10 MG RE SUPP: 10.0000 mg | Freq: Every day | RECTAL | Status: DC | PRN

## 2016-03-13 MED ORDER — SUCCINYLCHOLINE CHLORIDE 200 MG/10ML IV SOSY
PREFILLED_SYRINGE | INTRAVENOUS | Status: AC
  Filled 2016-03-13: qty 10

## 2016-03-13 MED ORDER — ROCURONIUM BROMIDE 100 MG/10ML IV SOLN
INTRAVENOUS | Status: DC | PRN
  Administered 2016-03-13: 50 mg via INTRAVENOUS

## 2016-03-13 MED ORDER — SODIUM CHLORIDE 0.9 % IV SOLN: 250.0000 mL | INTRAVENOUS | Status: DC

## 2016-03-13 MED ORDER — VITAMIN D 50 MCG (2000 UT) PO CAPS: 2000.0000 [IU] | ORAL_CAPSULE | Freq: Every day | ORAL | Status: DC

## 2016-03-13 MED ORDER — FOLIC ACID 800 MCG PO TABS: 800.0000 ug | ORAL_TABLET | Freq: Every day | ORAL | Status: DC

## 2016-03-13 MED ORDER — ALBUTEROL SULFATE (2.5 MG/3ML) 0.083% IN NEBU: 2.5000 mg | INHALATION_SOLUTION | Freq: Four times a day (QID) | RESPIRATORY_TRACT | Status: DC | PRN

## 2016-03-13 MED ORDER — BUPIVACAINE HCL (PF) 0.25 % IJ SOLN
INTRAMUSCULAR | Status: DC | PRN
  Administered 2016-03-13: 1 mL

## 2016-03-13 MED ORDER — SODIUM CHLORIDE 0.9 % IR SOLN
Status: DC | PRN
  Administered 2016-03-13: 500 mL

## 2016-03-13 MED ORDER — VANCOMYCIN HCL 1000 MG IV SOLR
INTRAVENOUS | Status: DC | PRN
  Administered 2016-03-13: 1000 mg via TOPICAL

## 2016-03-13 MED ORDER — CALCIUM CARBONATE-VITAMIN D 500-200 MG-UNIT PO TABS
1.0000 | ORAL_TABLET | Freq: Two times a day (BID) | ORAL | Status: DC
  Administered 2016-03-13 – 2016-03-14 (×3): 1 via ORAL
  Filled 2016-03-13 (×3): qty 1

## 2016-03-13 MED ORDER — ONDANSETRON HCL 4 MG/2ML IJ SOLN: 4.0000 mg | INTRAMUSCULAR | Status: DC | PRN

## 2016-03-13 MED ORDER — FOLIC ACID 1 MG PO TABS
1.0000 mg | ORAL_TABLET | Freq: Every day | ORAL | Status: DC
  Administered 2016-03-13 – 2016-03-14 (×2): 1 mg via ORAL
  Filled 2016-03-13 (×2): qty 1

## 2016-03-13 MED ORDER — PHENYLEPHRINE HCL 10 MG/ML IJ SOLN
INTRAMUSCULAR | Status: DC | PRN
  Administered 2016-03-13 (×3): 40 ug via INTRAVENOUS

## 2016-03-13 MED ORDER — FENTANYL CITRATE (PF) 100 MCG/2ML IJ SOLN
INTRAMUSCULAR | Status: DC | PRN
  Administered 2016-03-13 (×2): 50 ug via INTRAVENOUS

## 2016-03-13 MED ORDER — LISINOPRIL 10 MG PO TABS
10.0000 mg | ORAL_TABLET | Freq: Every morning | ORAL | Status: DC
  Administered 2016-03-14: 10 mg via ORAL
  Filled 2016-03-13 (×2): qty 1

## 2016-03-13 MED ORDER — OXYCODONE HCL 5 MG PO TABS
5.0000 mg | ORAL_TABLET | ORAL | Status: DC | PRN
  Administered 2016-03-13: 5 mg via ORAL

## 2016-03-13 MED ORDER — EPHEDRINE 5 MG/ML INJ
INTRAVENOUS | Status: AC
  Filled 2016-03-13: qty 10

## 2016-03-13 MED ORDER — SENNA 8.6 MG PO TABS
1.0000 | ORAL_TABLET | Freq: Two times a day (BID) | ORAL | Status: DC
  Administered 2016-03-13 – 2016-03-14 (×3): 8.6 mg via ORAL
  Filled 2016-03-13 (×3): qty 1

## 2016-03-13 MED ORDER — MIDAZOLAM HCL 2 MG/2ML IJ SOLN
INTRAMUSCULAR | Status: AC
  Filled 2016-03-13: qty 2

## 2016-03-13 MED ORDER — 0.9 % SODIUM CHLORIDE (POUR BTL) OPTIME
TOPICAL | Status: DC | PRN
  Administered 2016-03-13: 1000 mL

## 2016-03-13 MED ORDER — OXYCODONE HCL 5 MG PO TABS
ORAL_TABLET | ORAL | Status: AC
  Filled 2016-03-13: qty 1

## 2016-03-13 MED ORDER — PHENYLEPHRINE HCL 10 MG/ML IJ SOLN
10.0000 mg | INTRAMUSCULAR | Status: DC | PRN
  Administered 2016-03-13: 50 ug/min via INTRAVENOUS

## 2016-03-13 MED ORDER — PANTOPRAZOLE SODIUM 40 MG PO TBEC
40.0000 mg | DELAYED_RELEASE_TABLET | Freq: Every day | ORAL | Status: DC
  Administered 2016-03-14: 40 mg via ORAL
  Filled 2016-03-13 (×2): qty 1

## 2016-03-13 MED ORDER — POTASSIUM CHLORIDE IN NACL 20-0.9 MEQ/L-% IV SOLN
100.0000 mL/h | INTRAVENOUS | Status: DC
  Administered 2016-03-13 – 2016-03-14 (×2): 100 mL/h via INTRAVENOUS
  Filled 2016-03-13 (×6): qty 1000

## 2016-03-13 MED ORDER — METHYLPREDNISOLONE ACETATE 80 MG/ML IJ SUSP
INTRAMUSCULAR | Status: DC | PRN
  Administered 2016-03-13: 80 mg

## 2016-03-13 MED ORDER — CALCIUM CARBONATE-VITAMIN D 600-400 MG-UNIT PO TABS: 1.0000 | ORAL_TABLET | Freq: Two times a day (BID) | ORAL | Status: DC

## 2016-03-13 MED ORDER — THROMBIN 5000 UNITS EX SOLR
OROMUCOSAL | Status: DC | PRN
  Administered 2016-03-13: 09:00:00 via TOPICAL

## 2016-03-13 MED ORDER — BUPIVACAINE-EPINEPHRINE (PF) 0.5% -1:200000 IJ SOLN
INTRAMUSCULAR | Status: DC | PRN
  Administered 2016-03-13: 15 mL

## 2016-03-13 MED ORDER — MEPERIDINE HCL 25 MG/ML IJ SOLN: 6.2500 mg | INTRAMUSCULAR | Status: DC | PRN

## 2016-03-13 MED ORDER — SODIUM CHLORIDE 0.9% FLUSH: 3.0000 mL | INTRAVENOUS | Status: DC | PRN

## 2016-03-13 MED ORDER — MAGNESIUM OXIDE 400 (241.3 MG) MG PO TABS
400.0000 mg | ORAL_TABLET | Freq: Every day | ORAL | Status: DC
  Administered 2016-03-13 – 2016-03-14 (×2): 400 mg via ORAL
  Filled 2016-03-13 (×2): qty 1

## 2016-03-13 MED ORDER — VITAMIN D 1000 UNITS PO TABS
2000.0000 [IU] | ORAL_TABLET | Freq: Every day | ORAL | Status: DC
  Administered 2016-03-13 – 2016-03-14 (×2): 2000 [IU] via ORAL
  Filled 2016-03-13 (×2): qty 2

## 2016-03-13 MED ORDER — GABAPENTIN 300 MG PO CAPS
300.0000 mg | ORAL_CAPSULE | Freq: Three times a day (TID) | ORAL | Status: DC
  Administered 2016-03-13 – 2016-03-14 (×3): 300 mg via ORAL
  Filled 2016-03-13 (×3): qty 1

## 2016-03-13 MED ORDER — FENTANYL CITRATE (PF) 250 MCG/5ML IJ SOLN
INTRAMUSCULAR | Status: AC
  Filled 2016-03-13: qty 5

## 2016-03-13 MED ORDER — ZINC 50 MG PO CAPS: 50.0000 mg | ORAL_CAPSULE | Freq: Every day | ORAL | Status: DC

## 2016-03-13 MED ORDER — SUGAMMADEX SODIUM 200 MG/2ML IV SOLN
INTRAVENOUS | Status: DC | PRN
  Administered 2016-03-13: 110 mg via INTRAVENOUS

## 2016-03-13 MED ORDER — PROPOFOL 10 MG/ML IV BOLUS
INTRAVENOUS | Status: DC | PRN
  Administered 2016-03-13: 100 mg via INTRAVENOUS

## 2016-03-13 MED ORDER — METHOCARBAMOL 750 MG PO TABS
750.0000 mg | ORAL_TABLET | Freq: Four times a day (QID) | ORAL | Status: DC
  Administered 2016-03-13 – 2016-03-14 (×4): 750 mg via ORAL
  Filled 2016-03-13 (×4): qty 1

## 2016-03-13 MED ORDER — DEXAMETHASONE SODIUM PHOSPHATE 10 MG/ML IJ SOLN
INTRAMUSCULAR | Status: DC | PRN
  Administered 2016-03-13: 10 mg via INTRAVENOUS

## 2016-03-13 MED ORDER — SODIUM CHLORIDE 0.9% FLUSH
3.0000 mL | Freq: Two times a day (BID) | INTRAVENOUS | Status: DC
  Administered 2016-03-13 – 2016-03-14 (×2): 3 mL via INTRAVENOUS

## 2016-03-13 MED ORDER — ROCURONIUM BROMIDE 50 MG/5ML IV SOLN
INTRAVENOUS | Status: AC
  Filled 2016-03-13: qty 1

## 2016-03-13 MED ORDER — LIDOCAINE 2% (20 MG/ML) 5 ML SYRINGE
INTRAMUSCULAR | Status: AC
  Filled 2016-03-13: qty 5

## 2016-03-13 MED ORDER — HEMOSTATIC AGENTS (NO CHARGE) OPTIME
TOPICAL | Status: DC | PRN
  Administered 2016-03-13: 1 via TOPICAL

## 2016-03-13 MED ORDER — PROMETHAZINE HCL 25 MG/ML IJ SOLN: 6.2500 mg | INTRAMUSCULAR | Status: DC | PRN

## 2016-03-13 MED ORDER — MIDAZOLAM HCL 5 MG/5ML IJ SOLN
INTRAMUSCULAR | Status: DC | PRN
  Administered 2016-03-13: 1 mg via INTRAVENOUS

## 2016-03-13 MED ORDER — BUPIVACAINE LIPOSOME 1.3 % IJ SUSP
20.0000 mL | INTRAMUSCULAR | Status: AC
  Filled 2016-03-13: qty 20

## 2016-03-13 MED ORDER — LIDOCAINE HCL (CARDIAC) 20 MG/ML IV SOLN
INTRAVENOUS | Status: DC | PRN
  Administered 2016-03-13: 40 mg via INTRAVENOUS

## 2016-03-13 MED ORDER — CEFAZOLIN IN D5W 1 GM/50ML IV SOLN
1.0000 g | Freq: Three times a day (TID) | INTRAVENOUS | Status: AC
  Administered 2016-03-13 (×2): 1 g via INTRAVENOUS
  Filled 2016-03-13 (×2): qty 50

## 2016-03-13 MED ORDER — ACETAMINOPHEN 500 MG PO TABS
1000.0000 mg | ORAL_TABLET | Freq: Four times a day (QID) | ORAL | Status: DC
  Administered 2016-03-13 – 2016-03-14 (×4): 1000 mg via ORAL
  Filled 2016-03-13 (×5): qty 2

## 2016-03-13 MED ORDER — POTASSIUM CHLORIDE ER 10 MEQ PO TBCR
10.0000 meq | EXTENDED_RELEASE_TABLET | Freq: Every day | ORAL | Status: DC
  Administered 2016-03-13: 10 meq via ORAL
  Filled 2016-03-13 (×2): qty 1

## 2016-03-13 MED ORDER — PROPOFOL 10 MG/ML IV BOLUS
INTRAVENOUS | Status: AC
  Filled 2016-03-13: qty 20

## 2016-03-13 MED ORDER — PHENOL 1.4 % MT LIQD: 1.0000 | OROMUCOSAL | Status: DC | PRN

## 2016-03-13 MED ORDER — SODIUM CHLORIDE 0.9 % IV SOLN
INTRAVENOUS | Status: DC | PRN
  Administered 2016-03-13: 30 mL

## 2016-03-13 MED ORDER — MAGNESIUM 500 MG PO CAPS: 1.0000 | ORAL_CAPSULE | Freq: Every day | ORAL | Status: DC

## 2016-03-13 MED ORDER — KETOROLAC TROMETHAMINE 30 MG/ML IJ SOLN
INTRAMUSCULAR | Status: AC
  Filled 2016-03-13: qty 1

## 2016-03-13 MED ORDER — ONDANSETRON HCL 4 MG/2ML IJ SOLN
INTRAMUSCULAR | Status: AC
  Filled 2016-03-13: qty 2

## 2016-03-13 MED ORDER — DOCUSATE SODIUM 100 MG PO CAPS
100.0000 mg | ORAL_CAPSULE | Freq: Two times a day (BID) | ORAL | Status: DC
  Administered 2016-03-13 – 2016-03-14 (×3): 100 mg via ORAL
  Filled 2016-03-13 (×3): qty 1

## 2016-03-13 MED ORDER — ONDANSETRON HCL 4 MG/2ML IJ SOLN
INTRAMUSCULAR | Status: DC | PRN
  Administered 2016-03-13: 4 mg via INTRAVENOUS

## 2016-03-13 MED ORDER — LIDOCAINE-EPINEPHRINE 2 %-1:100000 IJ SOLN
INTRAMUSCULAR | Status: DC | PRN
  Administered 2016-03-13: 15 mL

## 2016-03-13 MED ORDER — VANCOMYCIN HCL 1000 MG IV SOLR
INTRAVENOUS | Status: AC
  Filled 2016-03-13: qty 1000

## 2016-03-13 MED ORDER — MENTHOL 3 MG MT LOZG: 1.0000 | LOZENGE | OROMUCOSAL | Status: DC | PRN

## 2016-03-13 MED ORDER — ALUM & MAG HYDROXIDE-SIMETH 200-200-20 MG/5ML PO SUSP: 30.0000 mL | Freq: Four times a day (QID) | ORAL | Status: DC | PRN

## 2016-03-13 MED ORDER — ALENDRONATE SODIUM 70 MG PO TABS: 70.0000 mg | ORAL_TABLET | ORAL | Status: DC

## 2016-03-13 SURGICAL SUPPLY — 72 items
ADH SKN CLS APL DERMABOND .7 (GAUZE/BANDAGES/DRESSINGS) ×1
APL SKNCLS STERI-STRIP NONHPOA (GAUZE/BANDAGES/DRESSINGS)
BAG DECANTER FOR FLEXI CONT (MISCELLANEOUS) ×3 IMPLANT
BENZOIN TINCTURE PRP APPL 2/3 (GAUZE/BANDAGES/DRESSINGS) IMPLANT
BIT DRILL NEURO 2X3.1 SFT TUCH (MISCELLANEOUS) ×1 IMPLANT
BLADE CLIPPER SURG (BLADE) IMPLANT
BLADE SURG 11 STRL SS (BLADE) ×3 IMPLANT
BUR ROUND FLUTED 5 RND (BURR) ×2 IMPLANT
BUR ROUND FLUTED 5MM RND (BURR) ×1
CANISTER SUCT 3000ML PPV (MISCELLANEOUS) ×4 IMPLANT
CHLORAPREP W/TINT 26ML (MISCELLANEOUS) ×3 IMPLANT
CLOSURE WOUND 1/2 X4 (GAUZE/BANDAGES/DRESSINGS)
CONT SPEC STER OR (MISCELLANEOUS) ×2 IMPLANT
DECANTER SPIKE VIAL GLASS SM (MISCELLANEOUS) ×3 IMPLANT
DERMABOND ADVANCED (GAUZE/BANDAGES/DRESSINGS) ×2
DERMABOND ADVANCED .7 DNX12 (GAUZE/BANDAGES/DRESSINGS) ×1 IMPLANT
DRAPE MICROSCOPE LEICA (MISCELLANEOUS) ×3 IMPLANT
DRAPE POUCH INSTRU U-SHP 10X18 (DRAPES) ×3 IMPLANT
DRAPE SURG 17X23 STRL (DRAPES) ×3 IMPLANT
DRILL NEURO 2X3.1 SOFT TOUCH (MISCELLANEOUS) ×3
DRSG OPSITE POSTOP 4X8 (GAUZE/BANDAGES/DRESSINGS) ×2 IMPLANT
ELECT REM PT RETURN 9FT ADLT (ELECTROSURGICAL) ×3
ELECTRODE REM PT RTRN 9FT ADLT (ELECTROSURGICAL) ×1 IMPLANT
GAUZE SPONGE 4X4 12PLY STRL (GAUZE/BANDAGES/DRESSINGS) IMPLANT
GAUZE SPONGE 4X4 16PLY XRAY LF (GAUZE/BANDAGES/DRESSINGS) IMPLANT
GLOVE BIOGEL PI IND STRL 6.5 (GLOVE) IMPLANT
GLOVE BIOGEL PI IND STRL 7.5 (GLOVE) ×1 IMPLANT
GLOVE BIOGEL PI IND STRL 8 (GLOVE) IMPLANT
GLOVE BIOGEL PI INDICATOR 6.5 (GLOVE) ×4
GLOVE BIOGEL PI INDICATOR 7.5 (GLOVE) ×2
GLOVE BIOGEL PI INDICATOR 8 (GLOVE) ×6
GLOVE ECLIPSE 7.5 STRL STRAW (GLOVE) ×8 IMPLANT
GLOVE SS BIOGEL STRL SZ 7 (GLOVE) ×2 IMPLANT
GLOVE SUPERSENSE BIOGEL SZ 7 (GLOVE) ×4
GLOVE SURG SS PI 6.5 STRL IVOR (GLOVE) ×2 IMPLANT
GOWN STRL REIN 2XL LVL4 (GOWN DISPOSABLE) ×2 IMPLANT
GOWN STRL REUS W/ TWL LRG LVL3 (GOWN DISPOSABLE) ×1 IMPLANT
GOWN STRL REUS W/ TWL XL LVL3 (GOWN DISPOSABLE) IMPLANT
GOWN STRL REUS W/TWL LRG LVL3 (GOWN DISPOSABLE) ×6
GOWN STRL REUS W/TWL XL LVL3 (GOWN DISPOSABLE)
HEMOSTAT POWDER KIT SURGIFOAM (HEMOSTASIS) ×3 IMPLANT
KIT BASIN OR (CUSTOM PROCEDURE TRAY) ×3 IMPLANT
KIT ROOM TURNOVER OR (KITS) ×3 IMPLANT
NDL HYPO 18GX1.5 BLUNT FILL (NEEDLE) ×1 IMPLANT
NDL HYPO 21X1.5 SAFETY (NEEDLE) ×2 IMPLANT
NDL HYPO 25X1 1.5 SAFETY (NEEDLE) ×1 IMPLANT
NEEDLE HYPO 18GX1.5 BLUNT FILL (NEEDLE) ×3 IMPLANT
NEEDLE HYPO 21X1.5 SAFETY (NEEDLE) ×9 IMPLANT
NEEDLE HYPO 25X1 1.5 SAFETY (NEEDLE) ×3 IMPLANT
NS IRRIG 1000ML POUR BTL (IV SOLUTION) ×3 IMPLANT
PACK LAMINECTOMY NEURO (CUSTOM PROCEDURE TRAY) ×3 IMPLANT
PACK UNIVERSAL I (CUSTOM PROCEDURE TRAY) ×3 IMPLANT
PAD ARMBOARD 7.5X6 YLW CONV (MISCELLANEOUS) ×19 IMPLANT
PATTIES SURGICAL .5X1.5 (GAUZE/BANDAGES/DRESSINGS) ×1 IMPLANT
RUBBERBAND STERILE (MISCELLANEOUS) ×6 IMPLANT
SPONGE SURGIFOAM ABS GEL SZ50 (HEMOSTASIS) ×3 IMPLANT
STRIP CLOSURE SKIN 1/2X4 (GAUZE/BANDAGES/DRESSINGS) IMPLANT
SUT MNCRL AB 3-0 PS2 18 (SUTURE) IMPLANT
SUT STRATAFIX MNCRL+ 3-0 PS-2 (SUTURE) ×1
SUT STRATAFIX MONOCRYL 3-0 (SUTURE) ×2
SUT VIC AB 0 CT1 18XCR BRD8 (SUTURE) ×2 IMPLANT
SUT VIC AB 0 CT1 8-18 (SUTURE) ×6
SUT VIC AB 2-0 CT1 18 (SUTURE) ×4 IMPLANT
SUT VIC AB 4-0 PS2 27 (SUTURE) ×3 IMPLANT
SUTURE STRATFX MNCRL+ 3-0 PS-2 (SUTURE) IMPLANT
SYR 30ML LL (SYRINGE) ×8 IMPLANT
SYR 5ML LL (SYRINGE) ×3 IMPLANT
TOWEL OR 17X24 6PK STRL BLUE (TOWEL DISPOSABLE) ×3 IMPLANT
TOWEL OR 17X26 10 PK STRL BLUE (TOWEL DISPOSABLE) ×3 IMPLANT
TUBE CONNECTING 12'X1/4 (SUCTIONS)
TUBE CONNECTING 12X1/4 (SUCTIONS) ×1 IMPLANT
WATER STERILE IRR 1000ML POUR (IV SOLUTION) ×3 IMPLANT

## 2016-03-13 NOTE — Op Note (Signed)
03/13/2016  9:31 AM  PATIENT:  [name]  75 y.o. female  PRE-OPERATIVE DIAGNOSIS:  Lumbosacral spondylosis with radiculopathy, degenerative scoliosis  POST-OPERATIVE DIAGNOSIS:  Same  PROCEDURE:  Left L4-5 and L5-S1 laminoforaminotomies and medial facetectomies; use of operating microscope  SURGEON:  Aldean Ast, MD  ASSISTANTS: Jovita Gamma, MD  ANESTHESIA:   General  DRAINS: None  SPECIMEN:  None  INDICATION FOR PROCEDURE: 75 year old woman with intractable left leg pain.  She has degenerative scoliosis but has profound left lateral recess and foraminal stenosis from L4-S1.  I recommended the above operation. Patient understood the risks, benefits, and alternatives and potential outcomes and wished to proceed.  PROCEDURE DETAILS: After smooth induction of general endotracheal anesthesia the patient was turned prone on the operating table on a Wilson frame.  The skin of the lumbar region was clipped of hair. It was wiped down with alcohol. The patient was then prepped and draped in usual sterile fashion.   The skin from L4-S1 was infiltrated with a mixture of lidocaine and Marcaine with epinephrine. A midline incision was made at this level.  Monopolar cautery was used to dissect through the subcutaneous tissues to the lumbodorsal fascia. Subperiosteal dissection was performed on the left and the interspinous ligament was kept intact. The level was then confirmed.   I used the high speed burr to thin the inferior 2/3 of the left L4 lamina, all of the left L5 lamina, and the superior portion of the left S1 lamina.  This was then resected with Kerrison punches.  Hypertrophied ligament was then separated from the dura and resected as well.  I drilled the medial aspect of the left L4 and L5 superior facets.  This further exposed the L4 and L5 nerve roots.  At this point the operating microscope was brought into the field to provide improved visibility of the neural elements.   Using microsurgical technique I further decompressed the L4 and L5 nerves as the exited the foramina.  I was able to palpate over the nerves with a ball tip probe and found them to be unobstructed.    I irrigated vigorously with bacitracin saline. I injected exparel into the paraspinous muscles.  There was excellent hemostasis. I placed a mixture of plain Marcaine, Toradol, and Depo-Medrol into the epidural space.  The wound was closed in routine anatomic layers using interrupted Vicryl sutures. The skin was sealed with Dermabond.   PATIENT DISPOSITION:  PACU - hemodynamically stable.   Delay start of Pharmacological VTE agent (>24hrs) due to surgical blood loss or risk of bleeding:  yes

## 2016-03-13 NOTE — Discharge Summary (Signed)
Date of Admission: 03/13/2016  Date of Discharge: 03/14/2016  Admission Diagnosis: Lumbosacral spondylosis with radiculopathy, lumbar stenosis L4-5 and L5-S1  Discharge Diagnosis: Same  Procedure Performed: Left L4-5 and L5-S1 laminoforaminotomies with medial facetectomies  Attending: Kevan Ny Ditty, MD  Hospital Course:  The patient was admitted for the above listed operation and had an uncomplicated post-operative course.  They were discharged in stable condition.  Follow up: 3 weeks    Medication List    ASK your doctor about these medications        alendronate 70 MG tablet  Commonly known as:  FOSAMAX  Take 70 mg by mouth every 7 (seven) days. Take with a full glass of water on an empty stomach.     CALTRATE 600+D 600-400 MG-UNIT tablet  Generic drug:  Calcium Carbonate-Vitamin D  Take 1 tablet by mouth 2 (two) times daily.     cetirizine 10 MG tablet  Commonly known as:  ZYRTEC  Take 10 mg by mouth daily as needed for allergies.     folic acid Q000111Q MCG tablet  Commonly known as:  FOLVITE  Take 800 mcg by mouth daily.     HYDROMET 5-1.5 MG/5ML syrup  Generic drug:  HYDROcodone-homatropine  Take 5 mLs by mouth every 6 (six) hours as needed for cough.     lisinopril 10 MG tablet  Commonly known as:  PRINIVIL,ZESTRIL  Take 10 mg by mouth every morning.     Magnesium 500 MG Caps  Take 1 capsule by mouth daily.     methotrexate 2.5 MG tablet  Commonly known as:  RHEUMATREX  TAKE 10 TABLETS BY MOUTH ONCE A WEEK WITH MEALS.     naproxen sodium 220 MG tablet  Commonly known as:  ANAPROX  Take 220 mg by mouth 2 (two) times daily with a meal.     omeprazole 20 MG capsule  Commonly known as:  PRILOSEC  Take 20 mg by mouth daily.     OVER THE COUNTER MEDICATION  Take 240 mLs by mouth daily. Quinine tonic water     potassium chloride 10 MEQ tablet  Commonly known as:  K-DUR  Take 10 mEq by mouth at bedtime.     PROAIR HFA 108 (90 Base) MCG/ACT inhaler   Generic drug:  albuterol  Inhale 1-2 puffs into the lungs every 6 (six) hours as needed for wheezing or shortness of breath.     Vitamin D 2000 units Caps  Take 2,000 Units by mouth daily.     Zinc 50 MG Caps  Take 50 mg by mouth daily.

## 2016-03-13 NOTE — Anesthesia Postprocedure Evaluation (Signed)
Anesthesia Post Note  Patient: [name]  Procedure(s) Performed: Procedure(s) (LRB): Left Lumbar Four-Five, Lumbar Five-Sacral One Laminotomy/Foraminotomy (Left)  Patient location during evaluation: PACU Anesthesia Type: General Level of consciousness: awake and alert Pain management: pain level controlled Vital Signs Assessment: post-procedure vital signs reviewed and stable Respiratory status: spontaneous breathing, nonlabored ventilation, respiratory function stable and patient connected to nasal cannula oxygen Cardiovascular status: blood pressure returned to baseline and stable Postop Assessment: no signs of nausea or vomiting Anesthetic complications: no    Last Vitals:  Filed Vitals:   03/13/16 1025 03/13/16 1150  BP:  123/55  Pulse: 82 77  Temp: 36.7 C 37.5 C  Resp: 18 20    Last Pain:  Filed Vitals:   03/13/16 1202  PainSc: 0-No pain                 [name]

## 2016-03-13 NOTE — Transfer of Care (Signed)
Immediate Anesthesia Transfer of Care Note  Patient: [name]  Procedure(s) Performed: Procedure(s): Left Lumbar Four-Five, Lumbar Five-Sacral One Laminotomy/Foraminotomy (Left)  Patient Location: PACU  Anesthesia Type:General  Level of Consciousness: awake, alert  and oriented  Airway & Oxygen Therapy: Patient Spontanous Breathing and Patient connected to nasal cannula oxygen  Post-op Assessment: Report given to RN, Post -op Vital signs reviewed and stable and Patient moving all extremities X 4  Post vital signs: Reviewed and stable  Last Vitals:  Filed Vitals:   03/13/16 0636  BP: 145/55  Pulse: 73  Temp: 37.2 C  Resp: 18    Last Pain: There were no vitals filed for this visit.       Complications: No apparent anesthesia complications

## 2016-03-13 NOTE — Progress Notes (Signed)
Dr Ditty @ bedside. Pt responding to commands. Neuro intact. VSS. Very sleepy & states sensitive to narcotics.  Scheduled oxycontin d/c'd per verbal  MD order. Spoke with Lattie Haw in main pharmacy to verify that order is no longer active.

## 2016-03-13 NOTE — H&P (Signed)
CC:  No chief complaint on file.   HPI: [name] is a 75 y.o. female with left sided lumbar radiculopathy and degenerative scoliosis.  She has intractable pain.  It has not improved with medical management.  She presents for left L4-5 and L5-S1 laminoforaminotomies.  PMH: Past Medical History  Diagnosis Date  . Rheumatoid arteritis   . Scoliosis   . Bulging disc   . Arm fracture, left   . Rotator cuff disorder, right   . GERD (gastroesophageal reflux disease)   . Hypertension   . Osteoporosis   . Shortness of breath dyspnea     "I have reactive airway disease".  . Hypothyroidism   . Neuropathy ([place])   . Difficulty swallowing solids   . Breast cancer ([place])   . Invasive ductal carcinoma of left breast ([place]) 02/24/2014    PSH: Past Surgical History  Procedure Laterality Date  . Abdominal hysterectomy    . Esophageal stricture    . Dilation and curettage of uterus    . Bunionectomy Right 10/28/2014    Procedure: SILVER BUNIONECTOMY RIGHT FOOT;  Surgeon: Marcheta Grammes, DPM;  Location: [facility];  Service: Podiatry;  Laterality: Right;  . Aiken osteotomy Right 10/28/2014    Procedure: Barbie Banner OSTEOTOMY RIGHT FOOT;  Surgeon: Marcheta Grammes, DPM;  Location: [facility];  Service: Podiatry;  Laterality: Right;  . Hammer toe surgery Right 10/28/2014    Procedure: HAMMER TOE CORRECTION 2ND AND 3RD TOE RIGHT FOOT;  Surgeon: Marcheta Grammes, DPM;  Location: [facility];  Service: Podiatry;  Laterality: Right;  . Removal of implant Right 09/29/2015    Procedure: REMOVAL OF IMPLANT 2ND TOE RIGHT FOOT;  Surgeon: Caprice Beaver, DPM;  Location: [facility];  Service: Podiatry;  Laterality: Right;  . Bone biopsy  09/29/2015    Procedure: BONE BIOPSY AND BONE CULTURE SECOND TOE RIGHT FOOT;  Surgeon: Caprice Beaver, DPM;  Location: [facility];  Service: Podiatry;;  . Breast surgery Left     lumpectomy and axillary lymph node with re-excision 2 weeks later   . Colonoscopy       SH: Social History  Substance Use Topics  . Smoking status: Never Smoker   . Smokeless tobacco: Never Used  . Alcohol Use: No    MEDS: Prior to Admission medications   Medication Sig Start Date End Date Taking? Authorizing Provider  albuterol (PROAIR HFA) 108 (90 Base) MCG/ACT inhaler Inhale 1-2 puffs into the lungs every 6 (six) hours as needed for wheezing or shortness of breath.   Yes Historical Provider, MD  alendronate (FOSAMAX) 70 MG tablet Take 70 mg by mouth every 7 (seven) days. Take with a full glass of water on an empty stomach.   Yes Historical Provider, MD  Calcium Carbonate-Vitamin D (CALTRATE 600+D) 600-400 MG-UNIT tablet Take 1 tablet by mouth 2 (two) times daily.   Yes Historical Provider, MD  cetirizine (ZYRTEC) 10 MG tablet Take 10 mg by mouth daily as needed for allergies.    Yes Historical Provider, MD  Cholecalciferol (VITAMIN D) 2000 UNITS CAPS Take 2,000 Units by mouth daily.    Yes Historical Provider, MD  folic acid (FOLVITE) Q000111Q MCG tablet Take 800 mcg by mouth daily.   Yes Historical Provider, MD  lisinopril (PRINIVIL,ZESTRIL) 10 MG tablet Take 10 mg by mouth every morning.  05/30/13  Yes Historical Provider, MD  Magnesium 500 MG CAPS Take 1 capsule by mouth daily.   Yes Historical Provider, MD  methotrexate (RHEUMATREX) 2.5  MG tablet TAKE 10 TABLETS BY MOUTH ONCE A WEEK WITH MEALS. 08/31/15  Yes Historical Provider, MD  naproxen sodium (ANAPROX) 220 MG tablet Take 220 mg by mouth 2 (two) times daily with a meal.   Yes Historical Provider, MD  omeprazole (PRILOSEC) 20 MG capsule Take 20 mg by mouth daily.   Yes Historical Provider, MD  OVER THE COUNTER MEDICATION Take 240 mLs by mouth daily. Quinine tonic water   Yes Historical Provider, MD  potassium chloride (K-DUR) 10 MEQ tablet Take 10 mEq by mouth at bedtime.   Yes Historical Provider, MD  Zinc 50 MG CAPS Take 50 mg by mouth daily.    Yes Historical Provider, MD  HYDROcodone-homatropine (HYDROMET) 5-1.5  MG/5ML syrup Take 5 mLs by mouth every 6 (six) hours as needed for cough.    Historical Provider, MD    ALLERGY: Allergies  Allergen Reactions  . Celecoxib Swelling    NO SPECIFIED AREA  . Ibuprofen Swelling    NO SPECIFIED AREA  . Diphenhydramine Rash    Itching ?  Marland Kitchen Wygesic [Propoxyphene N-Acetaminophen] Nausea And Vomiting    ROS: ROS  NEUROLOGIC EXAM: Awake, alert, oriented Memory and concentration grossly intact Speech fluent, appropriate CN grossly intact Motor exam: Upper Extremities Deltoid Bicep Tricep Grip  Right 5/5 5/5 5/5 5/5  Left 5/5 5/5 5/5 5/5   Lower Extremity IP Quad PF DF EHL  Right 5/5 5/5 5/5 5/5 5/5  Left 5/5 5/5 5/5 5/5 5/5   Sensation grossly intact to LT  IMAGING: No new imaging  IMPRESSION: - 75 y.o. female with lumbar radiculopathy and degenerative scoliosis.  I think her symptoms are predominantly due to lateral recess and foraminal stenosis at L4-5 and L5-S1 on the left.  We discussed treatment options including a large deformity correction operation, a more minimalistic decompression operation, and no intervention.  We both agreed that the minimalistic decompression is the most reasonable at this time.  I explained that it increases the likelihood that she will need additional surgery in the future.  She understood and wishes to proceed.  PLAN: - Left L4-5 and L5-S1 laminoforaminotomies

## 2016-03-13 NOTE — Progress Notes (Signed)
No acute post-op events Moving legs well

## 2016-03-13 NOTE — Progress Notes (Signed)
PHARMACIST - PHYSICIAN COMMUNICATION  CONCERNING: P&T Medication Policy Regarding Oral Bisphosphonates  RECOMMENDATION: Your order for alendronate (Fosamax), ibandronate (Boniva), or risedronate (Actonel) has been discontinued at this time.  If the patient's post-hospital medical condition warrants safe use of this class of drugs, please resume the pre-hospital regimen upon discharge.  DESCRIPTION:  Alendronate (Fosamax), ibandronate (Boniva), and risedronate (Actonel) can cause severe esophageal erosions in patients who are unable to remain upright at least 30 minutes after taking this medication.   Since brief interruptions in therapy are thought to have minimal impact on bone mineral density, the Pharmacy & Therapeutics Committee has established that bisphosphonate orders should be routinely discontinued during hospitalization.   To override this safety policy and permit administration of Boniva, Fosamax, or Actonel in the hospital, prescribers must write "DO NOT HOLD" in the comments section when placing the order for this class of medications.   Mikaili Flippin S. Takai Chiaramonte, PharmD, BCPS Clinical Staff Pharmacist Pager [phone]  

## 2016-03-13 NOTE — Anesthesia Procedure Notes (Signed)
Procedure Name: Intubation Date/Time: 03/13/2016 7:33 AM Performed by: Merrilyn Puma B Pre-anesthesia Checklist: Patient identified, Emergency Drugs available, Suction available, Patient being monitored and Timeout performed Patient Re-evaluated:Patient Re-evaluated prior to inductionOxygen Delivery Method: Circle system utilized Preoxygenation: Pre-oxygenation with 100% oxygen Intubation Type: IV induction Ventilation: Mask ventilation without difficulty Laryngoscope Size: Mac and 3 Grade View: Grade I Tube size: 7.0 mm Number of attempts: 1 Airway Equipment and Method: Stylet Placement Confirmation: breath sounds checked- equal and bilateral,  CO2 detector,  positive ETCO2 and ETT inserted through vocal cords under direct vision Secured at: 0 cm Tube secured with: Tape Dental Injury: Teeth and Oropharynx as per pre-operative assessment

## 2016-03-14 ENCOUNTER — Encounter (HOSPITAL_COMMUNITY): Payer: Self-pay | Admitting: Neurological Surgery

## 2016-03-14 DIAGNOSIS — M4307 Spondylolysis, lumbosacral region: Secondary | ICD-10-CM | POA: Diagnosis not present

## 2016-03-14 MED ORDER — HYDROCODONE-ACETAMINOPHEN 5-325 MG PO TABS: 1.0000 | ORAL_TABLET | Freq: Four times a day (QID) | ORAL | Status: DC | PRN

## 2016-03-14 MED ORDER — DOCUSATE SODIUM 100 MG PO CAPS: 100.0000 mg | ORAL_CAPSULE | Freq: Two times a day (BID) | ORAL | Status: DC

## 2016-03-14 MED ORDER — GABAPENTIN 300 MG PO CAPS: 300.0000 mg | ORAL_CAPSULE | Freq: Three times a day (TID) | ORAL | Status: DC

## 2016-03-14 MED ORDER — METHOCARBAMOL 750 MG PO TABS: 750.0000 mg | ORAL_TABLET | Freq: Four times a day (QID) | ORAL | Status: DC | PRN

## 2016-03-14 NOTE — Care Management Note (Addendum)
Case Management Note  Patient Details  Name: [name] MRN: [id number] Date of Birth: 07-Jan-1941  Subjective/Objective:   Pt underwent:  Left Lumbar Four-Five, Lumbar Five-Sacral One Laminotomy/Foraminotomy. She is from home alone.              Action/Plan: Patient is discharging home today with orders for [facility] services. CM met with the patient and provided her a list of [place] agencies that [facility] uses. She selected [facility]. Abby with [facility] notified and accepted the referral. Pt states her son is going to transport her home. Bedside RN updated.   Expected Discharge Date:   (Pending)               Expected Discharge Plan:  Home/Self Care  In-House Referral:     Discharge planning Services  CM Consult  Post Acute Care Choice:    Choice offered to:     DME Arranged:    DME Agency:     HH Arranged:    [place] Agency:     Status of Service:  Completed, signed off  If discussed at [employer] of Stay Meetings, dates discussed:    Additional Comments:  Pollie Friar, RN 03/14/2016, 10:33 AM

## 2016-03-14 NOTE — Progress Notes (Signed)
No acute events Doing well No pain Moving legs well Incision looks good D/c home

## 2016-03-14 NOTE — Progress Notes (Signed)
Occupational Therapy Evaluation Patient Details Name: [name] MRN: [id number] DOB: 1941/08/30 Today's Date: 03/14/2016    History of Present Illness [name] is a 75 y.o. female with left sided lumbar radiculopathy and degenerative scoliosis. S/p left L4-5 and L5-S1 laminoforaminotomies.   Clinical Impression   All OT education completed and pt questions answered. Patient requests [facility] aide at discharge. OT will sign off.     Follow Up Recommendations  Home health aide;Supervision/Assistance - 24 hour    Equipment Recommendations  None recommended by OT    Recommendations for Other Services       Precautions / Restrictions Precautions Precautions: Fall;Back Precaution Booklet Issued: Yes (comment) Precaution Comments: Reviewed back precautions and ADL implications Restrictions Weight Bearing Restrictions: No      Mobility Bed Mobility               General bed mobility comments: Pt found sitting in recliner.  Transfers Overall transfer level: Needs assistance Equipment used: Rolling walker (2 wheeled) Transfers: Sit to/from Stand Sit to Stand: Supervision             Balance                                       ADL Overall ADL's : Needs assistance/impaired Eating/Feeding: Independent;Sitting   Grooming: Wash/dry hands;Wash/dry face;Supervision/safety;Standing           Upper Body Dressing : Set up;Sitting   Lower Body Dressing: Supervision/safety;Cueing for back precautions;Sit to/from stand   Toilet Transfer: Supervision/safety;Ambulation;Comfort height toilet;RW   Toileting- Clothing Manipulation and Hygiene: Supervision/safety;Adhering to back precautions;Sit to/from stand   Tub/ Shower Transfer: Tub transfer;Min guard;Shower seat;Grab bars   Functional mobility during ADLs: Supervision/safety;Rolling walker General ADL Comments: Reviewed back precaution handout with patient with focus on ADL  implications. Patient practiced donning underwear and donning/doffing socks. Able to bring LEs to her in sitting without difficulty. Patient then ambulated to bathroom to toilet and practice toilet/tub transfers, then to sink to groom, then back to recliner to eat lunch. Educated her that she needs to shower only when someone is present to assist her. Patient verbalized understanding. She reports she has a 3 in 1 that she will place over her toilet if needed.     Vision     Perception     Praxis      Pertinent Vitals/Pain Pain Assessment: No/denies pain     Hand Dominance Right   Extremity/Trunk Assessment Upper Extremity Assessment Upper Extremity Assessment: Overall WFL for tasks assessed (has history of rhematoid arthritis)   Lower Extremity Assessment Lower Extremity Assessment: Defer to PT evaluation        Communication Communication Communication: No difficulties   Cognition Arousal/Alertness: Awake/alert Behavior During Therapy: WFL for tasks assessed/performed Overall Cognitive Status: Within Functional Limits for tasks assessed                     General Comments       Exercises       Shoulder Instructions      Home Living Family/patient expects to be discharged to:: Private residence Living Arrangements: Alone Available Help at Discharge: Family (daughter, who is a Marine scientist, will be staying with pt x 1 week) Type of Home: House Home Access: Stairs to enter [employer] of Steps: 6-8 Entrance Stairs-Rails: Right Home Layout: One level     Bathroom Shower/Tub:  Tub/shower unit   Bathroom Toilet: Handicapped height Bathroom Accessibility: Yes How Accessible: Accessible via walker Home Equipment: [place] - 2 wheels;Bedside commode;Shower seat;Grab bars - tub/shower   Additional Comments: Pt states she bathes herself but daughter helps her in and out of the shower.      Prior Functioning/Environment Level of Independence: Independent  with assistive device(s)        Comments: Pt used RW for ambulation before current surgery.    OT Diagnosis: Generalized weakness   OT Problem List: Decreased safety awareness;Decreased knowledge of use of DME or AE;Decreased knowledge of precautions   OT Treatment/Interventions:      OT Goals(Current goals can be found in the care plan section) Acute Rehab OT Goals Patient Stated Goal: home today OT Goal Formulation: All assessment and education complete, DC therapy  OT Frequency:     Barriers to D/C:            Co-evaluation              End of Session Equipment Utilized During Treatment: Rolling walker  Activity Tolerance: Patient tolerated treatment well Patient left: in chair;with call bell/phone within reach   Time: 1125-1154 OT Time Calculation (min): 29 min Charges:  OT General Charges $OT Visit: 1 Procedure OT Evaluation $OT Eval Low Complexity: 1 Procedure OT Treatments $Self Care/Home Management : 8-22 mins G-Codes: OT G-codes **NOT FOR INPATIENT CLASS** Functional Assessment Tool Used: clinical judgment Functional Limitation: Self care Self Care Current Status [id number]): At least 20 percent but less than 40 percent impaired, limited or restricted Self Care Goal Status [id number]): At least 20 percent but less than 40 percent impaired, limited or restricted Self Care Discharge Status [id number]): At least 20 percent but less than 40 percent impaired, limited or restricted  Ridhima Golberg A 03/14/2016, 12:12 PM

## 2016-03-14 NOTE — Evaluation (Signed)
Physical Therapy Evaluation Patient Details Name: [name] MRN: [id number] DOB: [birth year] Today's Date: 03/14/2016   History of Present Illness  [name] is a 75 y.o. female with left sided lumbar radiculopathy and degenerative scoliosis. S/p left L4-5 and L5-S1 laminoforaminotomies.  Clinical Impression  Pt was admitted with the above. She was educated on and is aware of back precautions, but has difficulty applying them. Pt ambulated 200 ft min guard with RW and performed stairs. She was impulsive with sit to stand and had a quick gait velocity. PT was instructed to take her time with movement. Pt tolerated treatment well. She will be going home with daughter who is a Marine scientist. Daughter will stay with pt for a week, and then patient's son is available for help after that. PT recommending home health for continued work on balance, strength, and application of back precautions to ADLs. Continue acute rehab follow.    Follow Up Recommendations Home health PT    Equipment Recommendations       Recommendations for Other Services       Precautions / Restrictions Precautions Precautions: Fall;Back Precaution Booklet Issued: Yes (comment) Precaution Comments: Pt instructed on BAT precautions. Restrictions Weight Bearing Restrictions: No      Mobility  Bed Mobility               General bed mobility comments: Pt found sitting in recliner.  Transfers Overall transfer level: Needs assistance Equipment used: Rolling walker (2 wheeled) Transfers: Sit to/from Stand Sit to Stand: Min guard         General transfer comment: Pt was eager to stand. PT had to remind pt to slow down and remember back precautions.  Ambulation/Gait Ambulation/Gait assistance: Min guard Ambulation Distance (Feet): 200 Feet Assistive device: Rolling walker (2 wheeled) Gait Pattern/deviations: Step-through pattern;WFL(Within Functional Limits)   Gait velocity interpretation: at or above  normal speed for age/gender General Gait Details: Pt aware the she tends to walk fast and needs to keep her body close to the walker.  Stairs Stairs: Yes Stairs assistance: Min guard Stair Management: Two rails;Alternating pattern;Step to pattern (Alternating ascending, step to descending.) Number of Stairs: 3 (+2 x 2)    Wheelchair Mobility    Modified Rankin (Stroke Patients Only)       Balance Overall balance assessment: Needs assistance Sitting-balance support: Feet supported;No upper extremity supported Sitting balance-Leahy Scale: Good     Standing balance support: During functional activity Standing balance-Leahy Scale: Fair                               Pertinent Vitals/Pain Pain Assessment: No/denies pain    Home Living Family/patient expects to be discharged to:: Private residence Living Arrangements: Alone Available Help at Discharge: Family (Daughter is a Marine scientist and will be staying with pt for a week.) Type of Home: House Home Access: Stairs to enter Entrance Stairs-Rails: Right Entrance Stairs-Number of Steps: 6-8 Home Layout: One level Home Equipment: Walker - 2 wheels;Toilet riser;Shower seat Additional Comments: Pt states she bathes herself but daughter helps her in and out of the shower.    Prior Function Level of Independence: Independent with assistive device(s)         Comments: Pt used RW for ambulation before current surgery.     Hand Dominance   Dominant Hand: Right    Extremity/Trunk Assessment   Upper Extremity Assessment: Generalized weakness  Lower Extremity Assessment: Generalized weakness;LLE deficits/detail   LLE Deficits / Details: Pt states she hasn't had feeling in her left LE and gets cramping. She hopes the surgery will fix this.      Communication   Communication: No difficulties  Cognition Arousal/Alertness: Awake/alert Behavior During Therapy: Impulsive;WFL for tasks  assessed/performed Overall Cognitive Status: Within Functional Limits for tasks assessed                      General Comments General comments (skin integrity, edema, etc.): Pt does not demonstrate full understanding of application of back precautions to ADLs.    Exercises        Assessment/Plan    PT Assessment Patient needs continued PT services  PT Diagnosis Difficulty walking;Abnormality of gait;Generalized weakness   PT Problem List Decreased strength;Decreased range of motion;Decreased balance;Decreased mobility;Decreased knowledge of use of DME;Decreased safety awareness;Decreased knowledge of precautions;Impaired sensation;Decreased coordination  PT Treatment Interventions DME instruction;Gait training;Stair training;Functional mobility training;Therapeutic activities;Therapeutic exercise;Balance training;Neuromuscular re-education;Cognitive remediation;Patient/family education   PT Goals (Current goals can be found in the Care Plan section) Acute Rehab PT Goals Patient Stated Goal: To go home and keep walking. PT Goal Formulation: With patient Time For Goal Achievement: 03/21/16 Potential to Achieve Goals: Good    Frequency Min 3X/week   Barriers to discharge        Co-evaluation               End of Session Equipment Utilized During Treatment: Gait belt Activity Tolerance: Patient tolerated treatment well Patient left: with call bell/phone within reach;in chair Nurse Communication: Mobility status         Time: [id number] PT Time Calculation (min) (ACUTE ONLY): 37 min   Charges:         PT G Codes:        Lashonne Shull 2016-03-20, 12:00 PM  Tawni Millers, SPT (student physical therapist) [place] [phone]

## 2016-03-14 NOTE — Progress Notes (Signed)
Patient is discharged from room 5C09 at this time. Alert and in stable condition. IV site d/c'd and instructions read to patient with understanding verbalized. Left unit via [facility]air with son and all belongings at side.

## 2016-03-15 DIAGNOSIS — Z4789 Encounter for other orthopedic aftercare: Secondary | ICD-10-CM | POA: Diagnosis not present

## 2016-03-15 DIAGNOSIS — M069 Rheumatoid arthritis, unspecified: Secondary | ICD-10-CM | POA: Diagnosis not present

## 2016-03-15 DIAGNOSIS — I10 Essential (primary) hypertension: Secondary | ICD-10-CM | POA: Diagnosis not present

## 2016-03-15 DIAGNOSIS — M4186 Other forms of scoliosis, lumbar region: Secondary | ICD-10-CM | POA: Diagnosis not present

## 2016-03-15 DIAGNOSIS — Z9181 History of falling: Secondary | ICD-10-CM | POA: Diagnosis not present

## 2016-03-15 DIAGNOSIS — M4806 Spinal stenosis, lumbar region: Secondary | ICD-10-CM | POA: Diagnosis not present

## 2016-03-15 DIAGNOSIS — M810 Age-related osteoporosis without current pathological fracture: Secondary | ICD-10-CM | POA: Diagnosis not present

## 2016-03-20 DIAGNOSIS — Z9181 History of falling: Secondary | ICD-10-CM | POA: Diagnosis not present

## 2016-03-20 DIAGNOSIS — M4186 Other forms of scoliosis, lumbar region: Secondary | ICD-10-CM | POA: Diagnosis not present

## 2016-03-20 DIAGNOSIS — I10 Essential (primary) hypertension: Secondary | ICD-10-CM | POA: Diagnosis not present

## 2016-03-20 DIAGNOSIS — M069 Rheumatoid arthritis, unspecified: Secondary | ICD-10-CM | POA: Diagnosis not present

## 2016-03-20 DIAGNOSIS — M810 Age-related osteoporosis without current pathological fracture: Secondary | ICD-10-CM | POA: Diagnosis not present

## 2016-03-20 DIAGNOSIS — M4806 Spinal stenosis, lumbar region: Secondary | ICD-10-CM | POA: Diagnosis not present

## 2016-03-20 DIAGNOSIS — Z4789 Encounter for other orthopedic aftercare: Secondary | ICD-10-CM | POA: Diagnosis not present

## 2016-04-07 DIAGNOSIS — F339 Major depressive disorder, recurrent, unspecified: Secondary | ICD-10-CM | POA: Diagnosis not present

## 2016-04-07 DIAGNOSIS — M790 Rheumatism, unspecified: Secondary | ICD-10-CM | POA: Diagnosis not present

## 2016-04-07 DIAGNOSIS — M545 Low back pain: Secondary | ICD-10-CM | POA: Diagnosis not present

## 2016-04-07 DIAGNOSIS — K210 Gastro-esophageal reflux disease with esophagitis: Secondary | ICD-10-CM | POA: Diagnosis not present

## 2016-04-07 DIAGNOSIS — E069 Thyroiditis, unspecified: Secondary | ICD-10-CM | POA: Diagnosis not present

## 2016-04-07 DIAGNOSIS — M816 Localized osteoporosis [Lequesne]: Secondary | ICD-10-CM | POA: Diagnosis not present

## 2016-04-07 DIAGNOSIS — E441 Mild protein-calorie malnutrition: Secondary | ICD-10-CM | POA: Diagnosis not present

## 2016-04-07 DIAGNOSIS — E039 Hypothyroidism, unspecified: Secondary | ICD-10-CM | POA: Diagnosis not present

## 2016-04-07 DIAGNOSIS — E559 Vitamin D deficiency, unspecified: Secondary | ICD-10-CM | POA: Diagnosis not present

## 2016-04-07 DIAGNOSIS — M069 Rheumatoid arthritis, unspecified: Secondary | ICD-10-CM | POA: Diagnosis not present

## 2016-04-07 DIAGNOSIS — I10 Essential (primary) hypertension: Secondary | ICD-10-CM | POA: Diagnosis not present

## 2016-04-07 DIAGNOSIS — D638 Anemia in other chronic diseases classified elsewhere: Secondary | ICD-10-CM | POA: Diagnosis not present

## 2016-04-24 DIAGNOSIS — M0579 Rheumatoid arthritis with rheumatoid factor of multiple sites without organ or systems involvement: Secondary | ICD-10-CM | POA: Diagnosis not present

## 2016-04-24 DIAGNOSIS — M545 Low back pain: Secondary | ICD-10-CM | POA: Diagnosis not present

## 2016-05-02 DIAGNOSIS — M069 Rheumatoid arthritis, unspecified: Secondary | ICD-10-CM | POA: Diagnosis not present

## 2016-05-02 DIAGNOSIS — I10 Essential (primary) hypertension: Secondary | ICD-10-CM | POA: Diagnosis not present

## 2016-05-02 DIAGNOSIS — M4806 Spinal stenosis, lumbar region: Secondary | ICD-10-CM | POA: Diagnosis not present

## 2016-05-02 DIAGNOSIS — Z4789 Encounter for other orthopedic aftercare: Secondary | ICD-10-CM | POA: Diagnosis not present

## 2016-05-02 DIAGNOSIS — M4186 Other forms of scoliosis, lumbar region: Secondary | ICD-10-CM | POA: Diagnosis not present

## 2016-05-11 IMAGING — US US SOFT TISSUE HEAD/NECK
1 series · 13 of 25 positions shown · non-contrast
Comparison: 11/11/2013; 04/22/2013; left-sided thyroid nodule
biopsy -05/06/2013

CLINICAL DATA: Multiple thyroid nodules. History of left-sided
thyroid nodule biopsy

EXAM:
THYROID ULTRASOUND
TECHNIQUE: Ultrasound examination of the thyroid gland and adjacent soft
tissues was performed.

[Series 1: us soft tissue head/neck · 0.04mm/px · 13 of 297 slices shown]
[im 1/297]
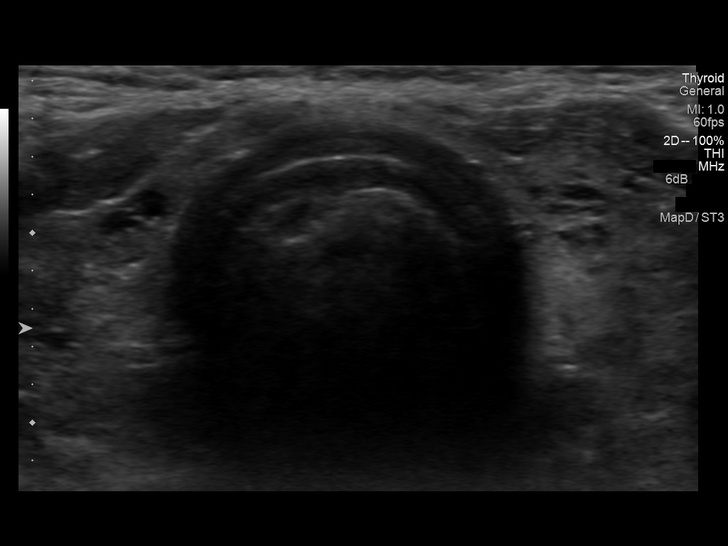
[im 25/297]
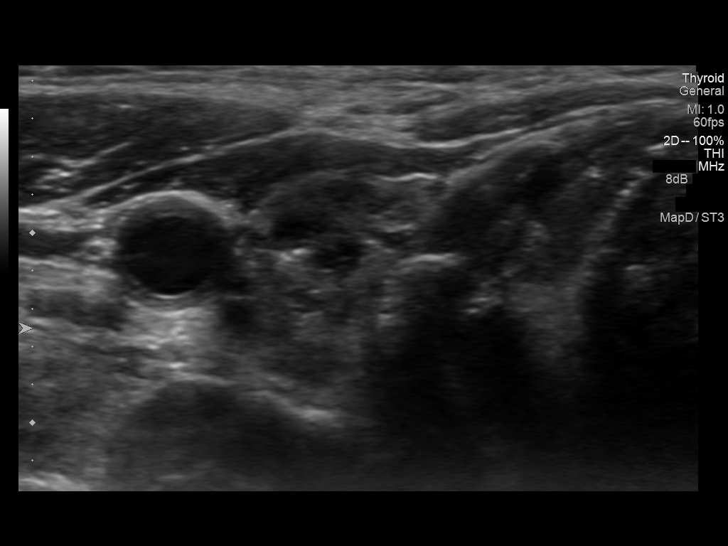
[im 50/297]
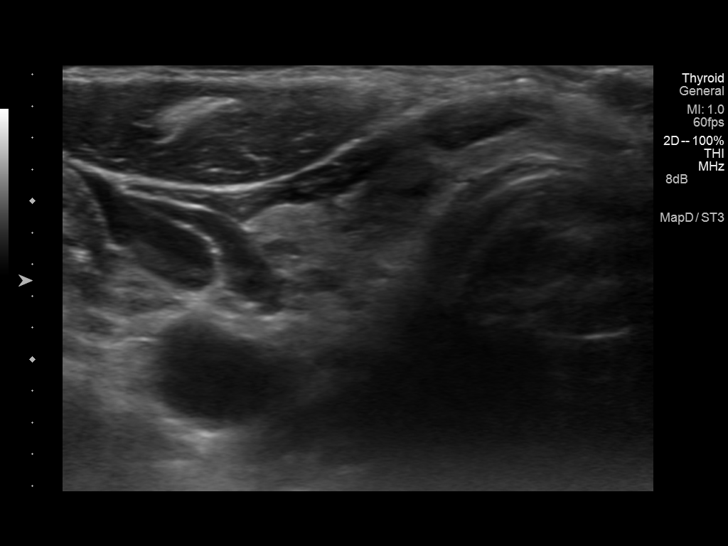
[im 75/297]
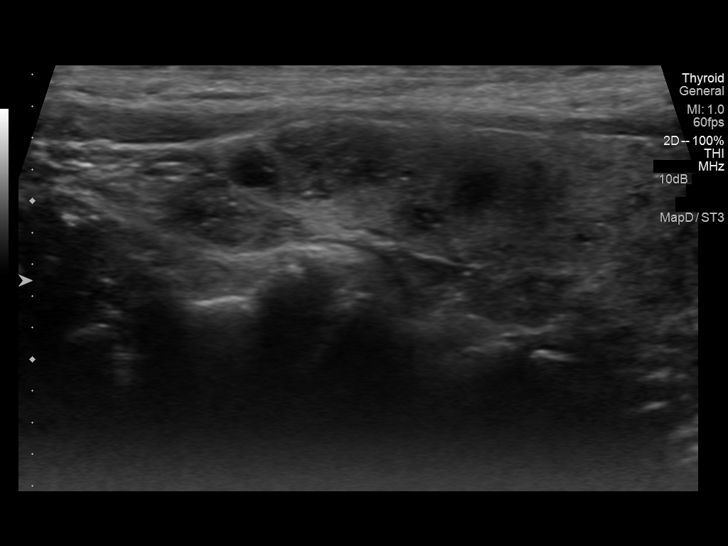
[im 99/297]
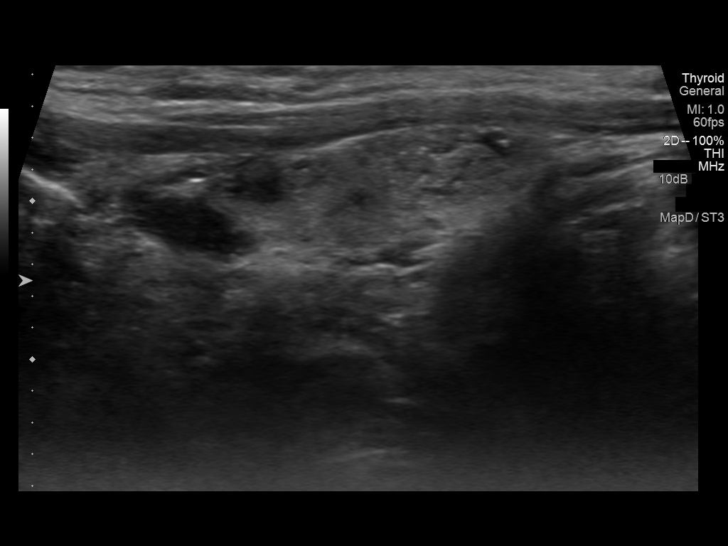
[im 124/297]
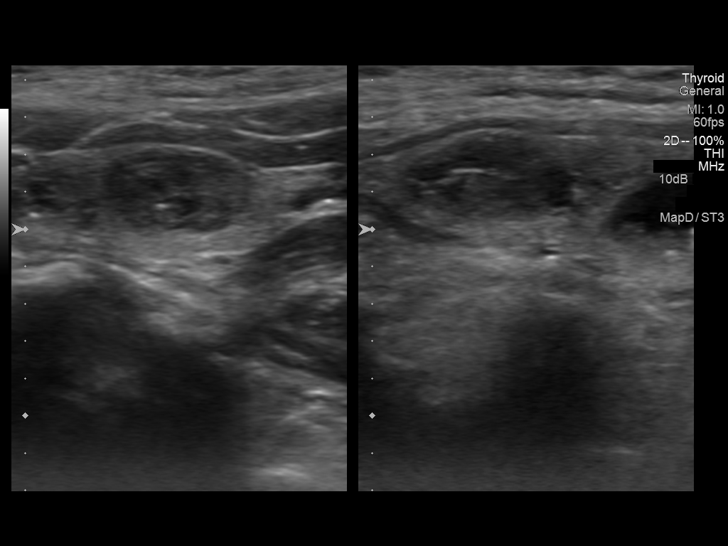
[im 149/297]
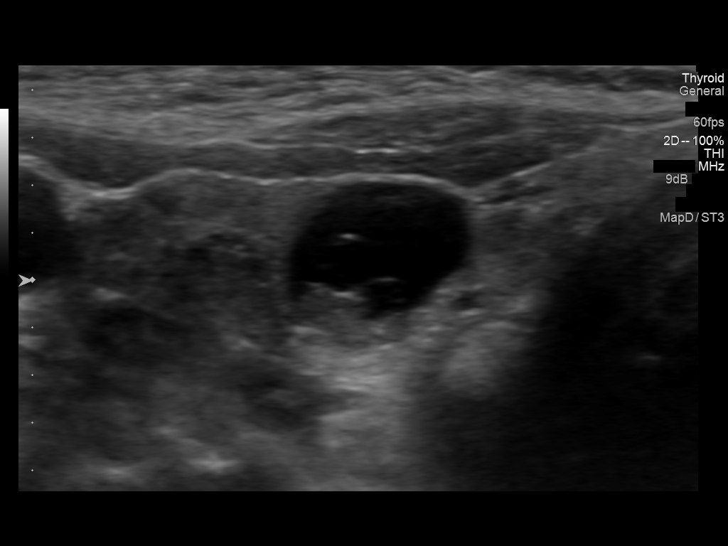
[im 173/297]
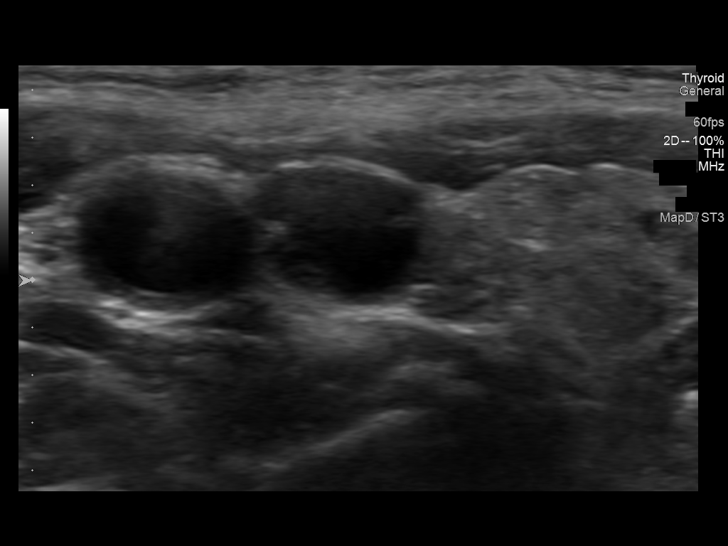
[im 198/297]
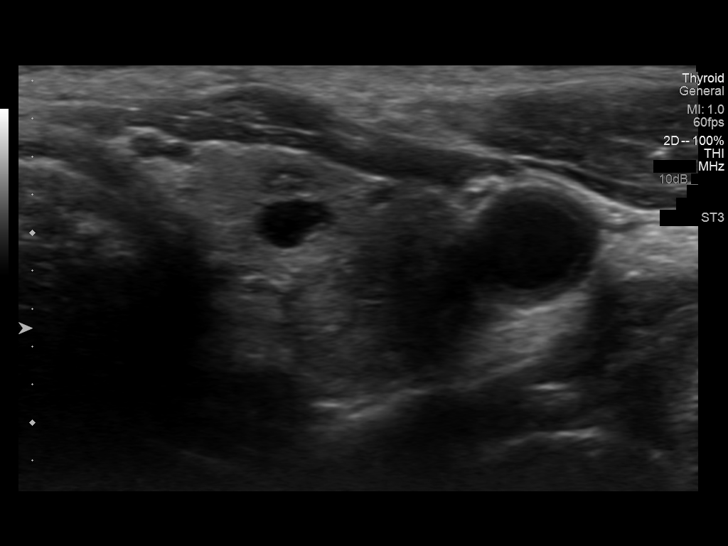
[im 223/297]
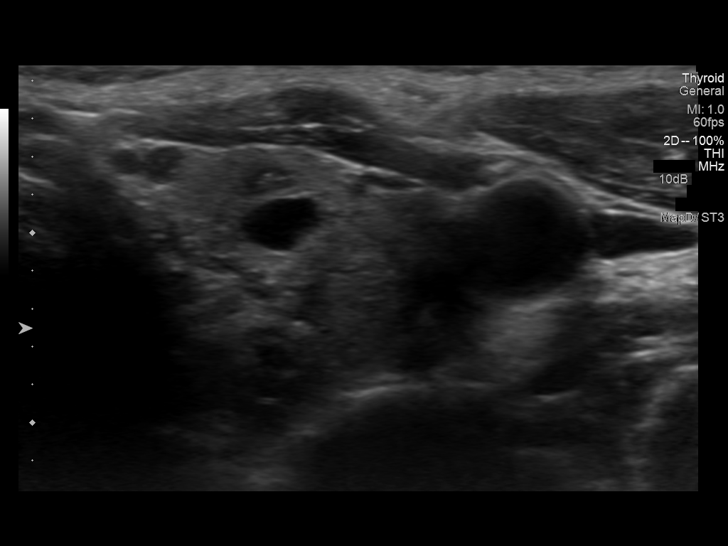
[im 247/297]
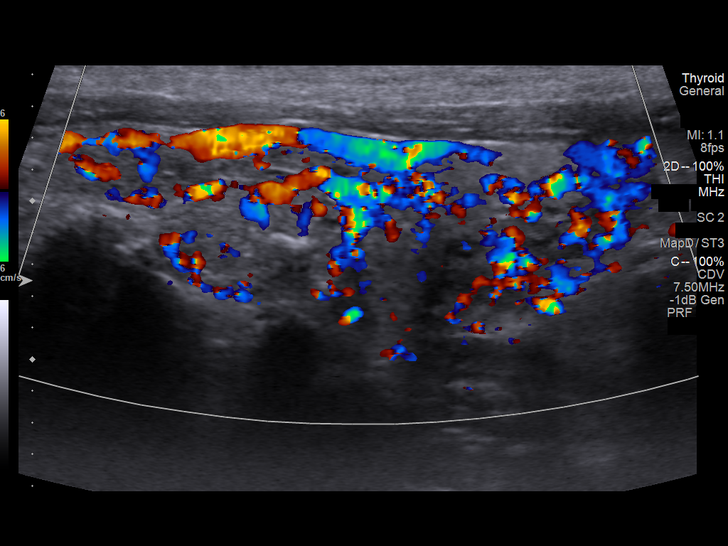
[im 272/297]
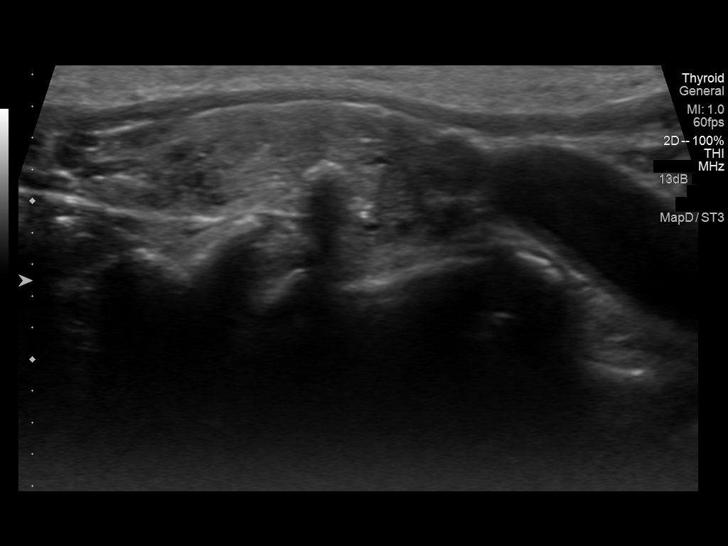
[im 297/297]
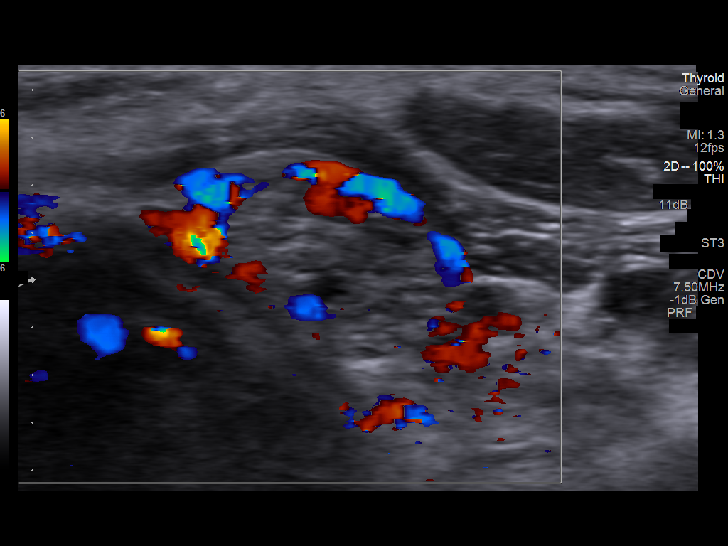

[13 of 25 positions shown; findings below may reference images not displayed]

FINDINGS: There is grossly unchanged marked diffuse heterogeneity of thyroid
parenchymal echotexture. No definitive new or enlarging thyroid
nodules.

Right thyroid lobe

Measurements: Normal in size measuring 4.3 x 1.3 x 1.9 cm,
unchanged, previously, 4.1 x 1.5 x 1.4 cm.

Right, superior - 0.8 x 1.0 x 0.5 cm - grossly unchanged since the
[DATE] examination at which time the nodule measured approximately
0.9 x 0.5 x 0.8 cm.

Left, mid - 0.9 x 0.7 x 0.8 cm - largely anechoic with minimal
examination.

Right, inferior - 1.0 x 0.6 x 0.9 cm-mixed echogenic, solid -
grossly unchanged in hind site.

Left thyroid lobe

Measurements: Normal in size measuring 4.3 x 1.5 x 1.6 cm,
unchanged, previously, 3.9 x 1.6 x 1.2 cm.

Left, inferior - 1.1 x 0.7 x 1.2 cm - mixed echogenic, solid grossly
unchanged since the [DATE] examination.

Isthmus

Thickness: Normal in size measuring 0.3 cm in diameter, unchanged.

No discrete nodule identified within the thyroid isthmus.

Lymphadenopathy

None visualized.
IMPRESSION: Similar findings of multi nodular goiter. No definitive new or
enlarging thyroid nodules. None of the discretely measured thyroid
nodules currently meet imaging criteria to recommend percutaneous
sampling.

This recommendation follows the consensus statement: Management of
Thyroid Nodules Detected at US: Society of Radiologists in
Ultrasound Consensus Conference Statement. Radiology 9660;

## 2016-05-12 DIAGNOSIS — L97511 Non-pressure chronic ulcer of other part of right foot limited to breakdown of skin: Secondary | ICD-10-CM | POA: Diagnosis not present

## 2016-05-12 DIAGNOSIS — M960 Pseudarthrosis after fusion or arthrodesis: Secondary | ICD-10-CM | POA: Diagnosis not present

## 2016-06-30 DIAGNOSIS — Z23 Encounter for immunization: Secondary | ICD-10-CM | POA: Diagnosis not present

## 2016-06-30 DIAGNOSIS — R3915 Urgency of urination: Secondary | ICD-10-CM | POA: Diagnosis not present

## 2016-06-30 DIAGNOSIS — Z01411 Encounter for gynecological examination (general) (routine) with abnormal findings: Secondary | ICD-10-CM | POA: Diagnosis not present

## 2016-06-30 DIAGNOSIS — R39198 Other difficulties with micturition: Secondary | ICD-10-CM | POA: Diagnosis not present

## 2016-07-26 DIAGNOSIS — M4317 Spondylolisthesis, lumbosacral region: Secondary | ICD-10-CM | POA: Diagnosis not present

## 2016-07-26 DIAGNOSIS — M4150 Other secondary scoliosis, site unspecified: Secondary | ICD-10-CM | POA: Diagnosis not present

## 2016-08-07 DIAGNOSIS — M0579 Rheumatoid arthritis with rheumatoid factor of multiple sites without organ or systems involvement: Secondary | ICD-10-CM | POA: Diagnosis not present

## 2016-08-07 DIAGNOSIS — M25512 Pain in left shoulder: Secondary | ICD-10-CM | POA: Diagnosis not present

## 2016-08-15 DIAGNOSIS — Z0000 Encounter for general adult medical examination without abnormal findings: Secondary | ICD-10-CM | POA: Diagnosis not present

## 2016-08-17 ENCOUNTER — Other Ambulatory Visit (HOSPITAL_COMMUNITY): Payer: Self-pay | Admitting: Neurological Surgery

## 2016-08-17 DIAGNOSIS — M4317 Spondylolisthesis, lumbosacral region: Secondary | ICD-10-CM

## 2016-08-22 ENCOUNTER — Ambulatory Visit (HOSPITAL_COMMUNITY)
Admission: RE | Admit: 2016-08-22 | Discharge: 2016-08-22 | Disposition: A | Discharge status: Home / Self Care | Payer: PPO | Source: Ambulatory Visit | Attending: Neurological Surgery | Admitting: Neurological Surgery

## 2016-08-22 DIAGNOSIS — M48061 Spinal stenosis, lumbar region without neurogenic claudication: Secondary | ICD-10-CM | POA: Insufficient documentation

## 2016-08-22 DIAGNOSIS — M4317 Spondylolisthesis, lumbosacral region: Secondary | ICD-10-CM | POA: Diagnosis not present

## 2016-08-22 LAB — POCT I-STAT CREATININE: CREATININE: 0.9 mg/dL (ref 0.44–1.00)

## 2016-08-22 MED ORDER — GADOBENATE DIMEGLUMINE 529 MG/ML IV SOLN
10.0000 mL | Freq: Once | INTRAVENOUS | Status: AC | PRN
  Administered 2016-08-22: 10 mL via INTRAVENOUS

## 2016-09-08 DIAGNOSIS — E039 Hypothyroidism, unspecified: Secondary | ICD-10-CM | POA: Diagnosis not present

## 2016-09-08 DIAGNOSIS — R5383 Other fatigue: Secondary | ICD-10-CM | POA: Diagnosis not present

## 2016-09-08 DIAGNOSIS — F339 Major depressive disorder, recurrent, unspecified: Secondary | ICD-10-CM | POA: Diagnosis not present

## 2016-09-08 DIAGNOSIS — S32000D Wedge compression fracture of unspecified lumbar vertebra, subsequent encounter for fracture with routine healing: Secondary | ICD-10-CM | POA: Diagnosis not present

## 2016-09-08 DIAGNOSIS — E0590 Thyrotoxicosis, unspecified without thyrotoxic crisis or storm: Secondary | ICD-10-CM | POA: Diagnosis not present

## 2016-09-08 DIAGNOSIS — Z6823 Body mass index (BMI) 23.0-23.9, adult: Secondary | ICD-10-CM | POA: Diagnosis not present

## 2016-09-08 DIAGNOSIS — F419 Anxiety disorder, unspecified: Secondary | ICD-10-CM | POA: Diagnosis not present

## 2016-09-08 DIAGNOSIS — I10 Essential (primary) hypertension: Secondary | ICD-10-CM | POA: Diagnosis not present

## 2016-09-08 DIAGNOSIS — M816 Localized osteoporosis [Lequesne]: Secondary | ICD-10-CM | POA: Diagnosis not present

## 2016-09-13 DIAGNOSIS — M4317 Spondylolisthesis, lumbosacral region: Secondary | ICD-10-CM | POA: Diagnosis not present

## 2016-09-13 DIAGNOSIS — M810 Age-related osteoporosis without current pathological fracture: Secondary | ICD-10-CM | POA: Diagnosis not present

## 2016-09-13 DIAGNOSIS — M4150 Other secondary scoliosis, site unspecified: Secondary | ICD-10-CM | POA: Diagnosis not present

## 2016-09-13 DIAGNOSIS — M4727 Other spondylosis with radiculopathy, lumbosacral region: Secondary | ICD-10-CM | POA: Diagnosis not present

## 2016-10-03 ENCOUNTER — Ambulatory Visit: Payer: Self-pay | Admitting: Internal Medicine

## 2016-10-03 ENCOUNTER — Ambulatory Visit (INDEPENDENT_AMBULATORY_CARE_PROVIDER_SITE_OTHER): Payer: PPO | Admitting: Internal Medicine

## 2016-10-03 ENCOUNTER — Encounter: Payer: Self-pay | Admitting: Internal Medicine

## 2016-10-03 VITALS — BP 120/74 | HR 76 | Ht 59.0 in | Wt 125.0 lb

## 2016-10-03 DIAGNOSIS — M80032S Age-related osteoporosis with current pathological fracture, left forearm, sequela: Secondary | ICD-10-CM

## 2016-10-03 DIAGNOSIS — M818 Other osteoporosis without current pathological fracture: Secondary | ICD-10-CM

## 2016-10-03 DIAGNOSIS — M80039A Age-related osteoporosis with current pathological fracture, unspecified forearm, initial encounter for fracture: Secondary | ICD-10-CM | POA: Insufficient documentation

## 2016-10-03 NOTE — Patient Instructions (Addendum)
Please collect a 24 h urine and bring it back. We will check labs when you come back.   Please discuss the exercises with Dr. Cyndy Freeze.  Patient information (Up-to-Date): Collection of a 24-hour urine specimen  - You should collect every drop of urine during each 24-hour period. It does not matter how much or little urine is passed each time, as long as every drop is collected. - Begin the urine collection in the morning after you wake up, after you have emptied your bladder for the first time. - Urinate (empty the bladder) for the first time and flush it down the toilet. Note the exact time (eg, 6:15 AM). You will begin the urine collection at this time. - Collect every drop of urine during the day and night in an empty collection bottle. Store the bottle at room temperature or in the refrigerator. - If you need to have a bowel movement, any urine passed with the bowel movement should be collected. Try not to include feces with the urine collection. If feces does get mixed in, do not try to remove the feces from the urine collection bottle. - Finish by collecting the first urine passed the next morning, adding it to the collection bottle. This should be within ten minutes before or after the time of the first morning void on the first day (which was flushed). In this example, you would try to void between 6:05 and 6:25 on the second day. - If you need to urinate one hour before the final collection time, drink a full glass of water so that you can void again at the appropriate time. If you have to urinate 20 minutes before, try to hold the urine until the proper time. - Please note the exact time of the final collection, even if it is not the same time as when collection began on day 1. - The bottle(s) may be kept at room temperature for a day or two, but should be kept cool or refrigerated for longer periods of time.   Please come back for a follow-up appointment in 3 months  How Can I Prevent  Falls? Men and women with osteoporosis need to take care not to fall down. Falls can break bones. Some reasons people fall are: Poor vision  Poor balance  Certain diseases that affect how you walk  Some types of medicine, such as sleeping pills.  Some tips to help prevent falls outdoors are: Use a cane or walker  Wear rubber-soled shoes so you don't slip  Walk on grass when sidewalks are slippery  In winter, put salt or kitty litter on icy sidewalks.  Some ways to help prevent falls indoors are: Keep rooms free of clutter, especially on floors  Use plastic or carpet runners on slippery floors  Wear low-heeled shoes that provide good support  Do not walk in socks, stockings, or slippers  Be sure carpets and area rugs have skid-proof backs or are tacked to the floor  Be sure stairs are well lit and have rails on both sides  Put grab bars on bathroom walls near tub, shower, and toilet  Use a rubber bath mat in the shower or tub  Keep a flashlight next to your bed  Use a sturdy step stool with a handrail and wide steps  Add more lights in rooms (and night lights) Buy a cordless phone to keep with you so that you don't have to rush to the phone  when it rings and so that you can call for help if you fall.   (adapted from http://www.niams.NightlifePreviews.se)  Dietary sources of calcium and vitamin D:  Calcium content (mg) - http://www.niams.MoviePins.co.za  Fortified oatmeal, 1 packet 350  Sardines, canned in oil, with edible bones, 3 oz. 324  Cheddar cheese, 1 oz. shredded 306  Milk, nonfat, 1 cup 302  Milkshake, 1 cup 300  Yogurt, plain, low-fat, 1 cup 300  Soybeans, cooked, 1 cup 261  Tofu, firm, with calcium,  cup 204  Orange juice, fortified with calcium, 6 oz. 200-260 (varies)  Salmon, canned, with edible bones, 3 oz. 181  Pudding, instant, made with 2% milk,  cup 153  Baked beans, 1 cup [place], 1% milk fat, 1 cup 138  Spaghetti, lasagna, 1 cup 125  Frozen yogurt, vanilla, soft-serve,  cup 103  Ready-to-eat cereal, fortified with calcium, 1 cup 100-1,000 (varies)  Cheese pizza, 1 slice 102  Fortified waffles, 2 100  Turnip greens, boiled,  cup 99  Broccoli, raw, 1 cup 90  Ice cream, vanilla,  cup 85  Soy or rice milk, fortified with calcium, 1 cup 80-500 (varies)   Vitamin D content (International Units, IU) - https://www.ars.usda.gov Cod liver oil, 1 tablespoon 1,360  Swordfish, cooked, 3 oz 566  Salmon (sockeye), cooked, 3 oz 447  Tuna fish, canned in water, drained, 3 oz 154  Orange juice fortified with vitamin D, 1 cup (check product labels, as amount of added vitamin D varies) 137  Milk, nonfat, reduced fat, and whole, vitamin D-fortified, 1 cup 115-124  Yogurt, fortified with 20% of the daily value for vitamin D, 6 oz 80  Margarine, fortified, 1 tablespoon 60  Sardines, canned in oil, drained, 2 sardines 46  Liver, beef, cooked, 3 oz 42  Egg, 1 large (vitamin D is found in yolk) 41  Ready-to-eat cereal, fortified with 10% of the daily value for vitamin D, 0.75-1 cup  40  Cheese, Swiss, 1 oz 6   Exercise for Strong Bones (from [place]) There are two types of exercises that are important for building and maintaining bone density:  weight-bearing and muscle-strengthening exercises. Weight-bearing Exercises These exercises include activities that make you move against gravity while staying upright. Weight-bearing exercises can be high-impact or low-impact. High-impact weight-bearing exercises help build bones and keep them strong. If you have broken a bone due to osteoporosis or are at risk of breaking a bone, you may need to avoid high-impact exercises. If you're not sure, you should check with your healthcare provider. Examples of high-impact weight-bearing exercises are: . Dancing . Doing high-impact  aerobics . Hiking . Jogging/running . Jumping Rope . Stair climbing . Tennis Low-impact weight-bearing exercises can also help keep bones strong and are a safe alternative if you cannot do high-impact exercises. Examples of low-impact weight-bearing exercises are: . Using elliptical training machines . Doing low-impact aerobics . Using stair-step machines . Fast walking on a treadmill or outside Muscle-Strengthening Exercises These exercises include activities where you move your body, a weight or some other resistance against gravity. They are also known as resistance exercises and include: . Lifting weights . Using elastic exercise bands . Using weight machines . Lifting your own body weight . Functional movements, such as standing and rising up on your toes Yoga and Pilates can also improve strength, balance and flexibility. However, certain positions may not be safe for people with osteoporosis or those at increased risk of broken bones. For  example, exercises that have you bend forward may increase the chance of breaking a bone in the spine. A physical therapist should be able to help you learn which exercises are safe and appropriate for you. Non-Impact Exercises Non-impact exercises can help you to improve balance, posture and how well you move in everyday activities. These exercises can also help to increase muscle strength and decrease the risk of falls and broken bones. Some of these exercises include: . Balance exercises that strengthen your legs and test your balance, such as Tai Chi, can decrease your risk of falls. . Posture exercises that improve your posture and reduce rounded or "sloping" shoulders can help you decrease the chance of breaking a bone, especially in the spine. . Functional exercises that improve how well you move can help you with everyday activities and decrease your chance of falling and breaking a bone. For example, if you have trouble getting up from a chair  or climbing stairs, you should do these activities as exercises. A physical therapist can teach you balance, posture and functional exercises. Starting a New Exercise Program If you haven't exercised regularly for a while, check with your healthcare provider before beginning a new exercise program-particularly if you have health problems such as heart disease, diabetes or high blood pressure. If you're at high risk of breaking a bone, you should work with a physical therapist to develop a safe exercise program. Once you have your healthcare provider's approval, start slowly. If you've already broken bones in the spine because of osteoporosis, be very careful to avoid activities that require reaching down, bending forward, rapid twisting motions, heavy lifting and those that increase your chance of a fall. As you get started, your muscles may feel sore for a day or two after you exercise. If soreness lasts longer, you may be working too hard and need to ease up. Exercises should be done in a pain-free range of motion. How Much Exercise Do You Need? Weight-bearing exercises 30 minutes on most days of the week. Do a 30-minutesession or multiple sessions spread out throughout the day. The benefits to your bones are the same.   Muscle-strengthening exercises Two to three days per week. If you don't have much time for strengthening/resistance training, do small amounts at a time. You can do just one body part each day. For example do arms one day, legs the next and trunk the next. You can also spread these exercises out during your normal day.  Balance, posture and functional exercises Every day or as often as needed. You may want to focus on one area more than the others. If you have fallen or lose your balance, spend time doing balance exercises. If you are getting rounded shoulders, work more on posture exercises. If you have trouble climbing stairs or getting up from the couch, do more functional exercises.  You can also perform these exercises at one time or spread them during your day. Work with a phyiscal therapist to learn the right exercises for you.

## 2016-10-03 NOTE — Progress Notes (Signed)
Patient ID: [name], female   DOB: 23-Apr-1941, 76 y.o.   MRN: [id number]    HPI  [name] is a 76 y.o.-year-old female, referred by her Ditty, for management of osteoporosis, with the specific request to start Teriparatide. She is here accompanied by her daughter (who is a Marine scientist) and her son. The daughter offers most of the history.  She has scoliosis >> will need to have surgery >> need to improve th quality of her spine before the Sx.  Pt was dx with OP in 2000s.  I reviewed pt's DEXA scans: Date L1-L4 T score FN T score 33% distal Radius  12/23/2015 n/a RFN: -2.2 LFN: -2.0 -3.8 (-11%*)  01/02/2012 n/a RFN: -2.2 LFN: -2.2 -3.0   She had occasional falls, not recently.  No dizziness/vertigo/orthostasis/poor vision. She had 1 fracture - L forearm ([place] fx) - 2000s  Previous OP treatments:  - Calcium since 1985 - Fosamax since ~10 years ago  No h/o vitamin D deficiency. No available vit D levels: No results found for: VD25OH  Pt is on calcium-vitD + vitamin D - 2000 IU. Also Mg 500 mg daily. She also eats a little dairy and green, leafy, vegetables.   No weight bearing exercises b/c back pain.  She does not take high vitamin A doses. No steroid injections.  Menopause was at 76 y/o.   Pt does have a FH of osteoporosis: Dowager hump in mother and GM.  No h/o hyper/hypocalcemia or hyperparathyroidism. No h/o kidney stones. Lab Results  Component Value Date   CALCIUM 9.2 03/06/2016   CALCIUM 9.3 09/14/2015   CALCIUM 9.5 10/23/2014   CALCIUM 9.8 06/03/2007   No h/o thyrotoxicosis. Reviewed TSH recent levels:  Lab Results  Component Value Date   TSH 1.467 11/20/2013   No h/o CKD. Last BUN/Cr: Lab Results  Component Value Date   BUN 16 03/06/2016   CREATININE 0.90 08/22/2016   She has a h/o Br CA s/p RxTx and ChTx. She was on Tamoxifen and then Aromasin.  ROS: Constitutional: no weight gain/loss, + fatigue, no subjective  hyperthermia/hypothermia Eyes: no blurry vision, no xerophthalmia ENT: no sore throat, no nodules palpated in throat, + dysphagia/no odynophagia, no hoarseness, + hypoacusis Cardiovascular: no CP/+ SOB/no palpitations/+ leg swelling Respiratory: no cough/+ SOB Gastrointestinal: no N/V/D/+ occasional C/+ heartburn Musculoskeletal: no muscle/+ joint aches Skin: no rashes Neurological: no tremors/numbness/tingling/dizziness Psychiatric: + both depression/anxiety  Past Medical History:  Diagnosis Date  . Arm fracture, left   . Breast cancer ([place])   . Bulging disc   . Difficulty swallowing solids   . GERD (gastroesophageal reflux disease)   . Hypertension   . Hypothyroidism   . Invasive ductal carcinoma of left breast ([place]) 02/24/2014  . Neuropathy ([place])   . Osteoporosis   . Rheumatoid arteritis   . Rotator cuff disorder, right   . Scoliosis   . Shortness of breath dyspnea    "I have reactive airway disease".   Past Surgical History:  Procedure Laterality Date  . ABDOMINAL HYSTERECTOMY    . Barbie Banner OSTEOTOMY Right 10/28/2014   Procedure: Barbie Banner OSTEOTOMY RIGHT FOOT;  Surgeon: Marcheta Grammes, DPM;  Location: [facility];  Service: Podiatry;  Laterality: Right;  . BONE BIOPSY  09/29/2015   Procedure: BONE BIOPSY AND BONE CULTURE SECOND TOE RIGHT FOOT;  Surgeon: Caprice Beaver, DPM;  Location: [facility];  Service: Podiatry;;  . BREAST SURGERY Left    lumpectomy and axillary lymph node with re-excision 2 weeks  later   . BUNIONECTOMY Right 10/28/2014   Procedure: SILVER BUNIONECTOMY RIGHT FOOT;  Surgeon: Marcheta Grammes, DPM;  Location: [facility];  Service: Podiatry;  Laterality: Right;  . COLONOSCOPY    . DILATION AND CURETTAGE OF UTERUS    . esophageal stricture    . HAMMER TOE SURGERY Right 10/28/2014   Procedure: HAMMER TOE CORRECTION 2ND AND 3RD TOE RIGHT FOOT;  Surgeon: Marcheta Grammes, DPM;  Location: [facility];  Service: Podiatry;  Laterality: Right;  . LUMBAR  LAMINECTOMY/DECOMPRESSION MICRODISCECTOMY Left 03/13/2016   Procedure: Left Lumbar Four-Five, Lumbar Five-Sacral One Laminotomy/Foraminotomy;  Surgeon: Kevan Ny Ditty, MD;  Location: [facility];  Service: Neurosurgery;  Laterality: Left;  . REMOVAL OF IMPLANT Right 09/29/2015   Procedure: REMOVAL OF IMPLANT 2ND TOE RIGHT FOOT;  Surgeon: Caprice Beaver, DPM;  Location: [facility];  Service: Podiatry;  Laterality: Right;   Social History   Social History  . Marital status: Widowed    Spouse name: N/A  . Number of children: 2   Occupational History  . retired   Social History Main Topics  . Smoking status: Never Smoker  . Smokeless tobacco: Never Used  . Alcohol use No  . Drug use: No   Current Outpatient Prescriptions on File Prior to Visit  Medication Sig Dispense Refill  . albuterol (PROAIR HFA) 108 (90 Base) MCG/ACT inhaler Inhale 1-2 puffs into the lungs every 6 (six) hours as needed for wheezing or shortness of breath.    Marland Kitchen alendronate (FOSAMAX) 70 MG tablet Take 70 mg by mouth every 7 (seven) days. Take with a full glass of water on an empty stomach.    . Calcium Carbonate-Vitamin D (CALTRATE 600+D) 600-400 MG-UNIT tablet Take 1 tablet by mouth 2 (two) times daily.    . cetirizine (ZYRTEC) 10 MG tablet Take 10 mg by mouth daily as needed for allergies.     . Cholecalciferol (VITAMIN D) 2000 UNITS CAPS Take 2,000 Units by mouth daily.     Marland Kitchen docusate sodium (COLACE) 100 MG capsule Take 1 capsule (100 mg total) by mouth 2 (two) times daily. 10 capsule 0  . folic acid (FOLVITE) 665 MCG tablet Take 800 mcg by mouth daily.    Marland Kitchen gabapentin (NEURONTIN) 300 MG capsule Take 1 capsule (300 mg total) by mouth 3 (three) times daily. 90 capsule 2  . HYDROcodone-acetaminophen (NORCO) 5-325 MG tablet Take 1-2 tablets by mouth every 6 (six) hours as needed for moderate pain. 60 tablet 0  . HYDROcodone-homatropine (HYDROMET) 5-1.5 MG/5ML syrup Take 5 mLs by mouth every 6 (six) hours as needed  for cough.    Marland Kitchen lisinopril (PRINIVIL,ZESTRIL) 10 MG tablet Take 10 mg by mouth every morning.     . Magnesium 500 MG CAPS Take 1 capsule by mouth daily.    . methotrexate (RHEUMATREX) 2.5 MG tablet TAKE 10 TABLETS BY MOUTH ONCE A WEEK WITH MEALS.    . naproxen sodium (ANAPROX) 220 MG tablet Take 220 mg by mouth 2 (two) times daily with a meal.    . omeprazole (PRILOSEC) 20 MG capsule Take 20 mg by mouth daily.    Marland Kitchen OVER THE COUNTER MEDICATION Take 240 mLs by mouth daily. Quinine tonic water    . potassium chloride (K-DUR) 10 MEQ tablet Take 10 mEq by mouth at bedtime.    . Zinc 50 MG CAPS Take 50 mg by mouth daily.     . methocarbamol (ROBAXIN) 750 MG tablet Take 1 tablet (750 mg  total) by mouth every 6 (six) hours as needed for muscle spasms. (Patient not taking: Reported on 10/03/2016) 120 tablet 2   No current facility-administered medications on file prior to visit.    Allergies  Allergen Reactions  . Celecoxib Swelling    NO SPECIFIED AREA  . Ibuprofen Swelling    NO SPECIFIED AREA  . Diphenhydramine Rash    Wide awake.   Micheline Maze [Propoxyphene N-Acetaminophen] Nausea And Vomiting   Family History  Problem Relation Age of Onset  . Hypertension Mother   . Cancer Maternal Grandmother   . Cancer Cousin     PE: BP 120/74 (BP Location: Left Arm, Patient Position: Sitting)   Pulse 76   Ht _0  (1.499 m)   Wt 125 lb (56.7 kg)   SpO2 96%   BMI 25.25 kg/m  Wt Readings from Last 3 Encounters:  10/03/16 125 lb (56.7 kg)  03/13/16 118 lb (53.5 kg)  03/06/16 118 lb 4 oz (53.6 kg)   Constitutional: normal weight, in NAD. + mild kyphosis. Eyes: PERRLA, EOMI, no exophthalmos ENT: moist mucous membranes, no thyromegaly, no cervical lymphadenopathy Cardiovascular: RRR, No MRG Respiratory: CTA B Gastrointestinal: abdomen soft, NT, ND, BS+ Musculoskeletal: no deformities, strength intact in all 4, no pain at spine percussion Skin: moist, warm, no rashes Neurological: + tremor  with B outstretched hands, DTR normal in all 4  Assessment: 1. Osteoporosis  Plan: 1. Osteoporosis - likely postmenopausal + 2/2 RA + 2/2 Aromasin tx + familial (mother and GM with Dowager hump) - Discussed about increased risk of fracture, depending on the T score, greatly increased when the T score is lower than -2.5, but it is actually a continuum and -2.5 should not be regarded as an absolute threshold. We reviewed her last 2 DEXA scan reports together with pt and her family, and I explained that based on the T scores, she has an increased risk for fractures. She has been on Fosamax for the last approximately 10 years, however, she is also on Zegerid, which can reduce the absorption of Fosamax. Her femoral neck BMDs are comparable between 2017 in 2013, which is great. However, her 33% distal radius BMD (reflect in the quality of the cortical bone) has decreased significantly at last check, compared to 2013. She also had a radial fracture in the past. Since the cortical bone is most likely to be affected by hyperparathyroidism, we absolutely need to check her for this condition. However, she does not have a history of hypercalcemia. - we reviewed her dietary and supplemental calcium and vitamin D intake. I recommended to make sure she gets 1000-1200 mg of calcium daily and I will check vit D today to see if she needs supplementation - given her specific instructions about food sources for Calcium and Vitamin D - see pt instructions. Patient's daughter tells me that she believes that Dr. Nevada Crane has checked her vitamin D 3 weeks ago. We called during the appointment to obtain the records, but did not obtain them yet. I will wait for the results and call the patient back for additional labs if needed. - discussed fall precautions   - given handout from [facility]: weight bearing exercises - to try to do this every day or at least 5/7 days. However, I reiterated the need to run all  the exercises by Dr. Cyndy Freeze and ONLY start if cleared by him. - we discussed about maintaining a good amount of protein in her diet. The recommended  daily protein intake is ~0.8 g per kilogram per day (For her, approximately 50 g). I advised her to try to aim for this amount, since a diet low in proteins can exacerbate osteoporosis. Also, avoid smoking or >2 drinks of alcohol a day. - We discussed about the different medication classes, focusing on Teriparatide, which was recommended by her neurosurgeon. I explained that the Forteo has bone-forming qualities as opposed to bisphosphonates and Prolia, which only reduce bone destruction. - I explained that we could only use Forteo for 2 years of her life and then we need to continue with another drug, probably zoledronic acid (iv Reclast) for 1-2 years. If the insurance does not approve Forteo, we may need to use Prolia. They agreed to start Forteo. Her daughter will give her the injections. 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 We discussed about possible side effect: Dizziness, headache, constipation. Her daughter expressed a concern for muscle cramps, which are possible. In this case, we may need to reduce the frequency of the medication to every other day, but I am hoping that we would not need to stop. - We will need to check a parathyroid hormone, calcium, vitamin D, kidney function, urinary calcium, before starting Forteo - will check a new DEXA scan in 2 years after starting Forteo.  - will see pt back in 3 months, hopefully at least 2 months into the [facility] treatment. She will need another urine collection at that time.  - time spent with the patient, her daughter and her son: 1 hour, of which >50% was spent in obtaining information about her symptoms, reviewing her previous labs, evaluations, and treatments, counseling her about her condition (please see the discussed topics above), and developing a plan to further investigate and  treat it; They had a number of questions which I addressed.  Received labs from Dr Nevada Crane: - 09/08/2016: BUN 10/creatinine 0.75, EGFR 77. Calcium 9.0. TSH 0.48, free T4 0.9  We called the patient back for PTH, vitamin D, and urinary calcium. Component     Latest Ref Rng & Units 10/06/2016 10/06/2016        11:01 AM 11:01 AM  Calcium     8.7 - 10.3 mg/dL  9.5  PTH     15 - 65 pg/mL  18  VITD     30.00 - 100.00 ng/mL 65.38    Component     Latest Ref Rng & Units 10/06/2016        11:02 AM  Calcium, Ur     Not estab mg/dL 14  Calcium, 24 hour urine     35 - 250 mg/24 h 140  Creatinine, Urine     20 - 320 mg/dL 109  Creatinine, 24H Ur     0.63 - 2.50 g/24 h 1.09   Results are normal. Green light for teriparatide. We'll start the preauthorization.  Philemon Kingdom, MD PhD [facility] Endocrinology

## 2016-10-04 ENCOUNTER — Other Ambulatory Visit (HOSPITAL_COMMUNITY): Payer: Self-pay | Admitting: Neurological Surgery

## 2016-10-04 DIAGNOSIS — M4727 Other spondylosis with radiculopathy, lumbosacral region: Secondary | ICD-10-CM

## 2016-10-06 ENCOUNTER — Other Ambulatory Visit: Payer: Self-pay

## 2016-10-06 DIAGNOSIS — M818 Other osteoporosis without current pathological fracture: Secondary | ICD-10-CM | POA: Diagnosis not present

## 2016-10-06 LAB — VITAMIN D 25 HYDROXY (VIT D DEFICIENCY, FRACTURES): VITD: 65.38 ng/mL (ref 30.00–100.00)

## 2016-10-07 LAB — PTH, INTACT AND CALCIUM
CALCIUM: 9.5 mg/dL (ref 8.7–10.3)
PTH: 18 pg/mL (ref 15–65)

## 2016-10-08 LAB — CREATININE, URINE, 24 HOUR
CREATININE 24H UR: 1.09 g/(24.h) (ref 0.63–2.50)
Creatinine, Urine: 109 mg/dL (ref 20–320)

## 2016-10-08 LAB — CALCIUM, URINE, 24 HOUR
Calcium, 24 hour urine: 140 mg/24 h (ref 35–250)
Calcium, Ur: 14 mg/dL

## 2016-10-09 DIAGNOSIS — F339 Major depressive disorder, recurrent, unspecified: Secondary | ICD-10-CM | POA: Diagnosis not present

## 2016-10-09 DIAGNOSIS — M816 Localized osteoporosis [Lequesne]: Secondary | ICD-10-CM | POA: Diagnosis not present

## 2016-10-09 DIAGNOSIS — R600 Localized edema: Secondary | ICD-10-CM | POA: Diagnosis not present

## 2016-10-09 DIAGNOSIS — M069 Rheumatoid arthritis, unspecified: Secondary | ICD-10-CM | POA: Diagnosis not present

## 2016-10-09 DIAGNOSIS — F411 Generalized anxiety disorder: Secondary | ICD-10-CM | POA: Diagnosis not present

## 2016-10-09 DIAGNOSIS — M6281 Muscle weakness (generalized): Secondary | ICD-10-CM | POA: Diagnosis not present

## 2016-10-09 DIAGNOSIS — E0590 Thyrotoxicosis, unspecified without thyrotoxic crisis or storm: Secondary | ICD-10-CM | POA: Diagnosis not present

## 2016-10-09 DIAGNOSIS — I10 Essential (primary) hypertension: Secondary | ICD-10-CM | POA: Diagnosis not present

## 2016-10-09 DIAGNOSIS — R5383 Other fatigue: Secondary | ICD-10-CM | POA: Diagnosis not present

## 2016-10-12 ENCOUNTER — Other Ambulatory Visit: Payer: Self-pay

## 2016-10-12 ENCOUNTER — Ambulatory Visit (HOSPITAL_COMMUNITY)
Admission: RE | Admit: 2016-10-12 | Discharge: 2016-10-12 | Disposition: A | Discharge status: Home / Self Care | Payer: PPO | Source: Ambulatory Visit | Attending: Neurological Surgery | Admitting: Neurological Surgery

## 2016-10-12 DIAGNOSIS — M4727 Other spondylosis with radiculopathy, lumbosacral region: Secondary | ICD-10-CM | POA: Diagnosis not present

## 2016-10-12 DIAGNOSIS — M48061 Spinal stenosis, lumbar region without neurogenic claudication: Secondary | ICD-10-CM | POA: Diagnosis not present

## 2016-10-12 DIAGNOSIS — M5116 Intervertebral disc disorders with radiculopathy, lumbar region: Secondary | ICD-10-CM | POA: Insufficient documentation

## 2016-10-12 DIAGNOSIS — M4856XA Collapsed vertebra, not elsewhere classified, lumbar region, initial encounter for fracture: Secondary | ICD-10-CM | POA: Insufficient documentation

## 2016-10-12 DIAGNOSIS — X58XXXA Exposure to other specified factors, initial encounter: Secondary | ICD-10-CM | POA: Diagnosis not present

## 2016-10-12 DIAGNOSIS — M546 Pain in thoracic spine: Secondary | ICD-10-CM | POA: Diagnosis not present

## 2016-10-12 MED ORDER — TERIPARATIDE (RECOMBINANT) 600 MCG/2.4ML ~~LOC~~ SOLN: 20.0000 ug | Freq: Every day | SUBCUTANEOUS | 2 refills | Status: DC

## 2016-10-13 ENCOUNTER — Telehealth: Payer: Self-pay

## 2016-10-13 NOTE — Telephone Encounter (Signed)
Called patient and she states that the medication forteo is too expensive. I sent in the PA for the medication and now she cant afford it. Please advise on what to do next. Thank you!

## 2016-10-13 NOTE — Telephone Encounter (Signed)
So she can now afford it or she cannot?

## 2016-10-13 NOTE — Telephone Encounter (Signed)
No she can not afford it even with the PA.

## 2016-10-17 NOTE — Telephone Encounter (Signed)
I called insurance yesterday, they are sending over paperwork for a tier exception that may have caused it to be expensive. I have not seen paper work yet, but I am looking for it.

## 2016-10-17 NOTE — Telephone Encounter (Signed)
OK, sounds good. Thank you!

## 2016-10-17 NOTE — Telephone Encounter (Signed)
Can you please call her insurance to find out exactly how much she needs to pay? I feel that the pharmacist did not have the correct info. If still too expensive, Can you check for Prolia price for her?

## 2016-10-18 DIAGNOSIS — M810 Age-related osteoporosis without current pathological fracture: Secondary | ICD-10-CM | POA: Diagnosis not present

## 2016-10-18 DIAGNOSIS — M4150 Other secondary scoliosis, site unspecified: Secondary | ICD-10-CM | POA: Diagnosis not present

## 2016-10-18 DIAGNOSIS — M4727 Other spondylosis with radiculopathy, lumbosacral region: Secondary | ICD-10-CM | POA: Diagnosis not present

## 2016-10-18 DIAGNOSIS — M4317 Spondylolisthesis, lumbosacral region: Secondary | ICD-10-CM | POA: Diagnosis not present

## 2016-10-23 DIAGNOSIS — Z79899 Other long term (current) drug therapy: Secondary | ICD-10-CM | POA: Diagnosis not present

## 2016-10-23 DIAGNOSIS — M545 Low back pain: Secondary | ICD-10-CM | POA: Diagnosis not present

## 2016-10-23 DIAGNOSIS — M8000XS Age-related osteoporosis with current pathological fracture, unspecified site, sequela: Secondary | ICD-10-CM | POA: Diagnosis not present

## 2016-10-23 DIAGNOSIS — M0579 Rheumatoid arthritis with rheumatoid factor of multiple sites without organ or systems involvement: Secondary | ICD-10-CM | POA: Diagnosis not present

## 2016-10-25 ENCOUNTER — Telehealth: Payer: Self-pay | Admitting: Internal Medicine

## 2016-10-25 ENCOUNTER — Other Ambulatory Visit: Payer: Self-pay | Admitting: Internal Medicine

## 2016-10-25 ENCOUNTER — Telehealth: Payer: Self-pay

## 2016-10-25 MED ORDER — ABALOPARATIDE 3120 MCG/1.56ML ~~LOC~~ SOPN: 80.0000 ug | PEN_INJECTOR | Freq: Every day | SUBCUTANEOUS | 11 refills | Status: DC

## 2016-10-25 NOTE — Telephone Encounter (Signed)
Called and discussed with patient, sent questions to MD.

## 2016-10-25 NOTE — Telephone Encounter (Signed)
Patient has questions about medication, not sure if the name. please advise.

## 2016-10-25 NOTE — Telephone Encounter (Signed)
Patient called about the new medication we submitted, the Abaloparatide,  her pharmacy does not carry that medication, I advised her that she may have to switch to a specialty pharmacy to get this medicine. Patient is not sure about this, she is also going to call her insurance and ask about the price of this med. I wanted you to know so we could make other arrangements if this does not go through.

## 2016-10-25 NOTE — Telephone Encounter (Signed)
Pt called back to discuss the abaloparatide, I went over the note again for the pt.

## 2016-10-26 NOTE — Telephone Encounter (Signed)
Lat's go ahead with the PA.

## 2016-10-26 NOTE — Telephone Encounter (Signed)
[facility] called need PA for medication Tymlof   [phone]

## 2016-10-27 NOTE — Telephone Encounter (Signed)
PA submitted, and faxed to [facility] today.

## 2016-10-27 NOTE — Telephone Encounter (Signed)
PA approved, will send to speciality pharmacy.

## 2016-11-22 ENCOUNTER — Telehealth: Payer: Self-pay

## 2016-11-22 NOTE — Telephone Encounter (Signed)
Called the speciality pharmacy, they stated with patient medicare it was cost $500. They recommended that she try to contact medicare and see if she could get more assistance, patient stated she would try to call. Patient is easily confused on what instructions are given to her. She also states that Dr.Ditty will do the surgery without any of the medications. I will talk to Dr.Gherghe to see what else we could do.

## 2016-12-07 DIAGNOSIS — M816 Localized osteoporosis [Lequesne]: Secondary | ICD-10-CM | POA: Diagnosis not present

## 2016-12-07 DIAGNOSIS — F419 Anxiety disorder, unspecified: Secondary | ICD-10-CM | POA: Diagnosis not present

## 2016-12-07 DIAGNOSIS — R5383 Other fatigue: Secondary | ICD-10-CM | POA: Diagnosis not present

## 2016-12-07 DIAGNOSIS — R600 Localized edema: Secondary | ICD-10-CM | POA: Diagnosis not present

## 2016-12-07 DIAGNOSIS — Z6824 Body mass index (BMI) 24.0-24.9, adult: Secondary | ICD-10-CM | POA: Diagnosis not present

## 2016-12-07 DIAGNOSIS — M069 Rheumatoid arthritis, unspecified: Secondary | ICD-10-CM | POA: Diagnosis not present

## 2016-12-07 DIAGNOSIS — I10 Essential (primary) hypertension: Secondary | ICD-10-CM | POA: Diagnosis not present

## 2016-12-07 DIAGNOSIS — R0602 Shortness of breath: Secondary | ICD-10-CM | POA: Diagnosis not present

## 2016-12-07 DIAGNOSIS — F339 Major depressive disorder, recurrent, unspecified: Secondary | ICD-10-CM | POA: Diagnosis not present

## 2016-12-07 DIAGNOSIS — E069 Thyroiditis, unspecified: Secondary | ICD-10-CM | POA: Diagnosis not present

## 2016-12-07 DIAGNOSIS — R531 Weakness: Secondary | ICD-10-CM | POA: Diagnosis not present

## 2016-12-15 ENCOUNTER — Telehealth: Payer: Self-pay | Admitting: Internal Medicine

## 2016-12-15 NOTE — Telephone Encounter (Signed)
Dr Ditty office call and would like to know if patient bonea are stable enough to with surgery,  Pleas advise  [phone]

## 2016-12-19 NOTE — Telephone Encounter (Signed)
I really cannot comment on this. She does have osteoporosis, which puts her at higher risk for fracture, but I cannot predict how she would tolerate the surgery. Unfortunately, due to her insurance, we were unable to start her on Teriparatide, Abaloparatide, or Prolia, at least so far. Any news on the Prolia coverage, Megan?

## 2016-12-19 NOTE — Telephone Encounter (Signed)
See message and please advise, Thanks!  

## 2016-12-20 NOTE — Telephone Encounter (Signed)
I contacted Dr. Hewitt Shorts office on 12/19/2016 and advised of this message. Patient was advised on 12/20/2016 the cost of her prolia injection would be between 200 $- 240. Patient stated she would discuss this with her daughter and let us know how she would like to proceed with the injection. Patient was advised if the patient's daughter had any questions to call back to discuss further if need be.

## 2016-12-21 ENCOUNTER — Telehealth: Payer: Self-pay | Admitting: *Deleted

## 2016-12-21 NOTE — Telephone Encounter (Signed)
Patient has been notified of message. Patient will speak with her daughter and will let us know how she is going to proceed with the prolia injection.

## 2016-12-21 NOTE — Telephone Encounter (Signed)
Verification of benefits have been processed and an approval has been received for pts prolia injection. Pts estimated cost are appx $220. This is only an estimate and cannot be confirmed until benefits are paid. Please advise pt and schedule if needed. If scheduled, once the injection is received, pls contact me back with the date it was received so that I am able to update prolia folder. thanks    This is pts first injection. You may proceed with Ca lab and schedule injection

## 2016-12-21 NOTE — Telephone Encounter (Signed)
Patient daughter is returning your call.  [phone]

## 2016-12-25 ENCOUNTER — Telehealth: Payer: Self-pay

## 2016-12-25 NOTE — Telephone Encounter (Signed)
Called daughter back, left message advising her to call back if she had questions and I would be glad to discuss.  Left call back number.

## 2016-12-29 ENCOUNTER — Encounter: Payer: Self-pay | Admitting: Internal Medicine

## 2016-12-29 ENCOUNTER — Ambulatory Visit (INDEPENDENT_AMBULATORY_CARE_PROVIDER_SITE_OTHER): Payer: PPO | Admitting: Internal Medicine

## 2016-12-29 VITALS — BP 98/54 | HR 88 | Resp 16 | Ht 59.0 in | Wt 128.1 lb

## 2016-12-29 DIAGNOSIS — M80032S Age-related osteoporosis with current pathological fracture, left forearm, sequela: Secondary | ICD-10-CM | POA: Diagnosis not present

## 2016-12-29 MED ORDER — DENOSUMAB 60 MG/ML ~~LOC~~ SOLN
60.0000 mg | Freq: Once | SUBCUTANEOUS | Status: AC
  Administered 2016-12-29: 60 mg via SUBCUTANEOUS

## 2016-12-29 NOTE — Progress Notes (Signed)
Patient ID: [name], female   DOB: [birth year], 76 y.o.   MRN: [id number]    HPI  [name] is a 76 y.o.-year-old female, initially referred by her Ditty, for management of osteoporosis, with the specific request to start Teriparatide. She is here accompanied by her daughter (who is a Marine scientist) and her son. The daughter offers most of the history. Last visit he months ago.  She fell 6x in last month >> no fractures.  She has scoliosis >> will need to have surgery (Dr. Cyndy Freeze) >> need to improve the quality of her spine before the Sx. However, we have been unsuccessful to get her insurance to approve Teriparatide or Abaloparatide, despite preauthorization's, appeals, and intervention from the company representative.  At today's visit, we discussed about starting Prolia, for which we obtained an approval from the insurance and we have it in stock for her.   Reviewed and addended history:  Pt was dx with OP in 2000s.  I reviewed pt's DEXA scans: Date L1-L4 T score FN T score 33% distal Radius  12/23/2015 n/a RFN: -2.2 LFN: -2.0 -3.8 (-11%*)  01/02/2012 n/a RFN: -2.2 LFN: -2.2 -3.0   She had occasional falls, not recently.  No dizziness/vertigo/orthostasis/poor vision. She had 1 fracture - L forearm ([place] fx) - 2000s  Previous OP treatments:  - Calcium since 1985 - Fosamax since ~10 years ago  No h/o vitamin D deficiency. No available vit D levels: Lab Results  Component Value Date   VD25OH 65.38 10/06/2016   Pt is on calcium-vitD + vitamin D - 2000 IU. Also Mg 500 mg daily. She also eats a little dairy and green, leafy, vegetables.   No weight bearing exercises b/c back pain.She walks with a walker   No steroid injections  Menopause was at 76 y/o.   Pt does have a FH of osteoporosis: Dowager hump in mother and GM.  No h/o hyper/hypocalcemia or hyperparathyroidism. No h/o kidney stones. Lab Results  Component Value Date   PTH 18 10/06/2016   PTH Comment  10/06/2016   CALCIUM 9.5 10/06/2016   CALCIUM 9.2 03/06/2016   CALCIUM 9.3 09/14/2015   CALCIUM 9.5 10/23/2014   CALCIUM 9.8 06/03/2007   No h/o thyrotoxicosis. Reviewed TSH recent levels:  09/08/2016: TSH 0.48, free T4 0.9 Lab Results  Component Value Date   TSH 1.467 11/20/2013   No h/o CKD. Last BUN/Cr: 09/08/2016: BUN 10/creatinine 0.75, EGFR 77 Lab Results  Component Value Date   BUN 16 03/06/2016   CREATININE 0.90 08/22/2016   She has a h/o Br CA s/p RxTx and ChTx. She was on Tamoxifen and then Aromasin.  Reviewed previous labs obtained at last visit:   Normal PTH, vitamin D, and calcium: Component     Latest Ref Rng & Units 10/06/2016 10/06/2016        11:01 AM 11:01 AM  Calcium     8.7 - 10.3 mg/dL  9.5  PTH     15 - 65 pg/mL  18  VITD     30.00 - 100.00 ng/mL 65.38    Normal 24-hour urine calcium: Component     Latest Ref Rng & Units 10/06/2016        11:02 AM  Calcium, Ur     Not estab mg/dL 14  Calcium, 24 hour urine     35 - 250 mg/24 h 140  Creatinine, Urine     20 - 320 mg/dL 109  Creatinine, 24H Ur  0.63 - 2.50 g/24 h 1.09   ROS: Constitutional: no weight gain/no weight loss, no fatigue, no subjective hyperthermia, no subjective hypothermia Eyes: no blurry vision, no xerophthalmia ENT: no sore throat, no nodules palpated in throat, no dysphagia, no odynophagia, no hoarseness Cardiovascular: no CP/no SOB/no palpitations/no leg swelling Respiratory: no cough/no SOB/no wheezing Gastrointestinal: no N/no V/no D/no C/no acid reflux Musculoskeletal: + muscle aches/+ joint aches Skin: no rashes, no hair loss Neurological: no tremors/no numbness/no tingling/no dizziness  Past Medical History:  Diagnosis Date  . Arm fracture, left   . Breast cancer ([place])   . Bulging disc   . Difficulty swallowing solids   . GERD (gastroesophageal reflux disease)   . Hypertension   . Hypothyroidism   . Invasive ductal carcinoma of left breast ([place]) 02/24/2014  .  Neuropathy   . Osteoporosis   . Rheumatoid arteritis   . Rotator cuff disorder, right   . Scoliosis   . Shortness of breath dyspnea    "I have reactive airway disease".   Past Surgical History:  Procedure Laterality Date  . ABDOMINAL HYSTERECTOMY    . Barbie Banner OSTEOTOMY Right 10/28/2014   Procedure: Barbie Banner OSTEOTOMY RIGHT FOOT;  Surgeon: Marcheta Grammes, DPM;  Location: [facility];  Service: Podiatry;  Laterality: Right;  . BONE BIOPSY  09/29/2015   Procedure: BONE BIOPSY AND BONE CULTURE SECOND TOE RIGHT FOOT;  Surgeon: Caprice Beaver, DPM;  Location: [facility];  Service: Podiatry;;  . BREAST SURGERY Left    lumpectomy and axillary lymph node with re-excision 2 weeks later   . BUNIONECTOMY Right 10/28/2014   Procedure: SILVER BUNIONECTOMY RIGHT FOOT;  Surgeon: Marcheta Grammes, DPM;  Location: [facility];  Service: Podiatry;  Laterality: Right;  . COLONOSCOPY    . DILATION AND CURETTAGE OF UTERUS    . esophageal stricture    . HAMMER TOE SURGERY Right 10/28/2014   Procedure: HAMMER TOE CORRECTION 2ND AND 3RD TOE RIGHT FOOT;  Surgeon: Marcheta Grammes, DPM;  Location: [facility];  Service: Podiatry;  Laterality: Right;  . LUMBAR LAMINECTOMY/DECOMPRESSION MICRODISCECTOMY Left 03/13/2016   Procedure: Left Lumbar Four-Five, Lumbar Five-Sacral One Laminotomy/Foraminotomy;  Surgeon: Kevan Ny Ditty, MD;  Location: [facility];  Service: Neurosurgery;  Laterality: Left;  . REMOVAL OF IMPLANT Right 09/29/2015   Procedure: REMOVAL OF IMPLANT 2ND TOE RIGHT FOOT;  Surgeon: Caprice Beaver, DPM;  Location: [facility];  Service: Podiatry;  Laterality: Right;   Social History   Social History  . Marital status: Widowed    Spouse name: N/A  . Number of children: 2   Occupational History  . retired   Social History Main Topics  . Smoking status: Never Smoker  . Smokeless tobacco: Never Used  . Alcohol use No  . Drug use: No   Current Outpatient Prescriptions on File Prior to Visit   Medication Sig Dispense Refill  . albuterol (PROAIR HFA) 108 (90 Base) MCG/ACT inhaler Inhale 1-2 puffs into the lungs every 6 (six) hours as needed for wheezing or shortness of breath.    Marland Kitchen alendronate (FOSAMAX) 70 MG tablet Take 70 mg by mouth every 7 (seven) days. Take with a full glass of water on an empty stomach.    . Calcium Carbonate-Vitamin D (CALTRATE 600+D) 600-400 MG-UNIT tablet Take 1 tablet by mouth 2 (two) times daily.    . cetirizine (ZYRTEC) 10 MG tablet Take 10 mg by mouth daily as needed for allergies.     . Cholecalciferol (VITAMIN D) 2000  UNITS CAPS Take 2,000 Units by mouth daily.     Marland Kitchen docusate sodium (COLACE) 100 MG capsule Take 1 capsule (100 mg total) by mouth 2 (two) times daily. 10 capsule 0  . folic acid (FOLVITE) 269 MCG tablet Take 800 mcg by mouth daily.    Marland Kitchen gabapentin (NEURONTIN) 300 MG capsule Take 1 capsule (300 mg total) by mouth 3 (three) times daily. 90 capsule 2  . HYDROcodone-acetaminophen (NORCO) 5-325 MG tablet Take 1-2 tablets by mouth every 6 (six) hours as needed for moderate pain. 60 tablet 0  . HYDROcodone-homatropine (HYDROMET) 5-1.5 MG/5ML syrup Take 5 mLs by mouth every 6 (six) hours as needed for cough.    . Magnesium 500 MG CAPS Take 1 capsule by mouth daily.    . methocarbamol (ROBAXIN) 750 MG tablet Take 1 tablet (750 mg total) by mouth every 6 (six) hours as needed for muscle spasms. 120 tablet 2  . methotrexate (RHEUMATREX) 2.5 MG tablet TAKE 10 TABLETS BY MOUTH ONCE A WEEK WITH MEALS.    . naproxen sodium (ANAPROX) 220 MG tablet Take 220 mg by mouth 2 (two) times daily with a meal.    . omeprazole (PRILOSEC) 20 MG capsule Take 20 mg by mouth daily.    Marland Kitchen OVER THE COUNTER MEDICATION Take 240 mLs by mouth daily. Quinine tonic water    . potassium chloride (K-DUR) 10 MEQ tablet Take 10 mEq by mouth at bedtime.    . Zinc 50 MG CAPS Take 50 mg by mouth daily.      No current facility-administered medications on file prior to visit.     Allergies  Allergen Reactions  . Celecoxib Swelling    NO SPECIFIED AREA  . Ibuprofen Swelling    NO SPECIFIED AREA  . Diphenhydramine Rash    Wide awake.   Micheline Maze [Propoxyphene N-Acetaminophen] Nausea And Vomiting   Family History  Problem Relation Age of Onset  . Hypertension Mother   . Cancer Maternal Grandmother   . Cancer Cousin     PE: BP (!) 98/54   Pulse 88   Resp 16   Ht '4\' 11"'  (1.499 m)   Wt 128 lb 2 oz (58.1 kg)   SpO2 95%   BMI 25.88 kg/m   Wt Readings from Last 3 Encounters:  12/29/16 128 lb 2 oz (58.1 kg)  10/03/16 125 lb (56.7 kg)  03/13/16 118 lb (53.5 kg)   Constitutional: normal weight, in NAD. + mild kyphosis. Eyes: PERRLA, EOMI, no exophthalmos ENT: moist mucous membranes, no thyromegaly, no cervical lymphadenopathy Cardiovascular: RRR, No MRG Respiratory: CTA B Gastrointestinal: abdomen soft, NT, ND, BS+ Musculoskeletal: no deformities, strength intact in all 4 Skin: moist, warm, no rashes Neurological: + tremor with B outstretched hands, DTR normal in all 4  Assessment: 1. Osteoporosis  Plan: 1. Osteoporosis - likely postmenopausal + 2/2 RA + 2/2 Aromasin tx + familial (mother and GM with Dowager hump) - Patient has been on Fosamax for the last approximately 10 years, however, she has also been on Zegerid which can reduce the absorption of Fosamax and also calcium. Her femoral neck BMD is comparable between 2017 and 2013 however her 33% distal radius BMD (reflecting the quality of the cortical bone) has decreased significantly at last check, compared to 2015. She also has a history of a radial fracture in the past. A parathyroid hormone has been normal though. In fact, her entire investigation for secondary causes for osteoporosis has been negative. - Dr. Cyndy Freeze is  planning to perform scoliosis surgery in the patient, however, due to the osteoporosis, surgery comforters service for vertebral fractures. Therefore, he would have been ideal to  be able to start patient on Forteo/Tymlos, however, these were not covered by insurance. The next best thing would be Prolia which can reduce bone destruction and overall protect from fractures to a lesser extent. We again discussed with patient, her daughter and son about possible side effects from Prolia and I also gave them written information about it. We also discussed about the benefits of using the medication. They agreed to proceed with Prolia and we administered the first dose today. - will check a new DEXA scan in 2 years after starting Prolia.  - will see pt back in 1 year  Philemon Kingdom, MD PhD [facility]

## 2016-12-29 NOTE — Patient Instructions (Signed)
We will give your Prolia injection today.  Please return in 1 year.  Denosumab: Patient drug information (Up-to-date) Copyright [phone] [employer] rights reserved.  Brand Names: [place]  ProliaDelton See What is this drug used for?  It is used to treat soft, brittle bones (osteoporosis).  It is used for bone growth.  It is used when treating some cancers.  It may be given to you for other reasons. Talk with the doctor. What do I need to tell my doctor BEFORE I take this drug?  All products:  If you have an allergy to denosumab or any other part of this drug.  If you are allergic to any drugs like this one, any other drugs, foods, or other substances. Tell your doctor about the allergy and what signs you had, like rash; hives; itching; shortness of breath; wheezing; cough; swelling of face, lips, tongue, or throat; or any other signs.  If you have low calcium levels.  Prolia:  If you are pregnant or may be pregnant. Do not take this drug if you are pregnant.  This is not a list of all drugs or health problems that interact with this drug.  Tell your doctor and pharmacist about all of your drugs (prescription or OTC, natural products, vitamins) and health problems. You must check to make sure that it is safe for you to take this drug with all of your drugs and health problems. Do not start, stop, or change the dose of any drug without checking with your doctor. What are some things I need to know or do while I take this drug?  All products:  Tell dentists, surgeons, and other doctors that you use this drug.  This drug may raise the chance of a broken leg. Talk with your doctor.  Have your blood work checked. Talk with your doctor.  Have a bone density test. Talk with your doctor.  Take calcium and vitamin D as you were told by your doctor.  Have a dental exam before starting this drug.  Take good care of your teeth. See a dentist often.  If you smoke, talk with  your doctor.  Do not give to a child. Talk with your doctor.  Tell your doctor if you are breast-feeding. You will need to talk about any risks to your baby.  Delton See:  This drug may cause harm to the unborn baby if you take it while you are pregnant. If you get pregnant while taking this drug, call your doctor right away.  Prolia:  Very bad infections have been reported with use of this drug. If you have any infection, are taking antibiotics now or in the recent past, or have many infections, talk with your doctor.  You may have more chance of getting an infection. Wash hands often. Stay away from people with infections, colds, or flu.  Use birth control that you can trust to prevent pregnancy while taking this drug.  If you are a man and your sex partner is pregnant or gets pregnant at any time while you are being treated, talk with your doctor. What are some side effects that I need to call my doctor about right away?  WARNING/CAUTION: Even though it may be rare, some people may have very bad and sometimes deadly side effects when taking a drug. Tell your doctor or get medical help right away if you have any of the following signs or symptoms that may be related to a very bad side effect:  All products:  Signs of an allergic reaction, like rash; hives; itching; red, swollen, blistered, or peeling skin with or without fever; wheezing; tightness in the chest or throat; trouble breathing or talking; unusual hoarseness; or swelling of the mouth, face, lips, tongue, or throat.  Signs of low calcium levels like muscle cramps or spasms, numbness and tingling, or seizures.  Mouth sores.  Any new or strange groin, hip, or thigh pain.  This drug may cause jawbone problems. The chance may be higher the longer you take this drug. The chance may be higher if you have cancer, dental problems, dentures that do not fit well, anemia, blood clotting problems, or an infection. The chance may also be  higher if you are having dental work or if you are getting chemo, some steroid drugs, or radiation. Call your doctor right away if you have jaw swelling or pain.  Xgeva:  Not hungry.  Muscle pain or weakness.  Seizures.  Shortness of breath.  Prolia:  Signs of infection. These include a fever of 100.2F (38C) or higher, chills, very bad sore throat, ear or sinus pain, cough, more sputum or change in color of sputum, pain with passing urine, mouth sores, wound that will not heal, or anal itching or pain.  Signs of a pancreas problem (pancreatitis) like very bad stomach pain, very bad back pain, or very bad upset stomach or throwing up.  Chest pain.  A heartbeat that does not feel normal.  Very bad skin irritation.  Feeling very tired or weak.  Bladder pain or pain when passing urine or change in how much urine is passed.  Passing urine often.  Swelling in the arms or legs. What are some other side effects of this drug?  All drugs may cause side effects. However, many people have no side effects or only have minor side effects. Call your doctor or get medical help if any of these side effects or any other side effects bother you or do not go away:  Xgeva:  Feeling tired or weak.  Headache.  Upset stomach or throwing up.  Loose stools (diarrhea).  Cough.  Prolia:  Back pain.  Muscle or joint pain.  Sore throat.  Runny nose.  Pain in arms or legs.  These are not all of the side effects that may occur. If you have questions about side effects, call your doctor. Call your doctor for medical advice about side effects.  You may report side effects to your national health agency. How is this drug best taken?  Use this drug as ordered by your doctor. Read and follow the dosing on the label closely.  It is given as a shot into the fatty part of the skin. What do I do if I miss a dose?  Call the doctor to find out what to do. How do I store and/or throw out this  drug?  This drug will be given to you in a hospital or doctor's office. You will not store it at home.  Keep all drugs out of the reach of children and pets.  Check with your pharmacist about how to throw out unused drugs.  General drug facts  If your symptoms or health problems do not get better or if they become worse, call your doctor.  Do not share your drugs with others and do not take anyone else's drugs.  Keep a list of all your drugs (prescription, natural products, vitamins, OTC) with you. Give this list to your doctor.  Talk with the doctor before starting any new drug, including prescription or OTC, natural products, or vitamins.  Some drugs may have another patient information leaflet. If you have any questions about this drug, please talk with your doctor, pharmacist, or other health care provider.  If you think there has been an overdose, call your poison control center or get medical care right away. Be ready to tell or show what was taken, how much, and when it happened.

## 2017-01-02 ENCOUNTER — Telehealth: Payer: Self-pay

## 2017-01-02 NOTE — Telephone Encounter (Signed)
Dr.Ditty's office called back regarding the okay for patient to have surgery. I advised of previous message, and they understood. She stated she would let Dr.Ditty know.

## 2017-01-17 ENCOUNTER — Telehealth: Payer: Self-pay | Admitting: Internal Medicine

## 2017-01-17 NOTE — Telephone Encounter (Signed)
Please advise. Thank you

## 2017-01-17 NOTE — Telephone Encounter (Signed)
Patient ask if she should be taking fosamax on the prolia shot. Please advise

## 2017-01-18 ENCOUNTER — Telehealth: Payer: Self-pay

## 2017-01-18 NOTE — Telephone Encounter (Signed)
Called and advised patient to only take the Prolia shot, not to take Fosamax. Patient understood, no questions at this time.

## 2017-01-18 NOTE — Telephone Encounter (Signed)
No, only Prolia

## 2017-02-05 DIAGNOSIS — I10 Essential (primary) hypertension: Secondary | ICD-10-CM | POA: Diagnosis not present

## 2017-02-05 DIAGNOSIS — E039 Hypothyroidism, unspecified: Secondary | ICD-10-CM | POA: Diagnosis not present

## 2017-02-05 DIAGNOSIS — E559 Vitamin D deficiency, unspecified: Secondary | ICD-10-CM | POA: Diagnosis not present

## 2017-02-07 DIAGNOSIS — R531 Weakness: Secondary | ICD-10-CM | POA: Diagnosis not present

## 2017-02-07 DIAGNOSIS — I10 Essential (primary) hypertension: Secondary | ICD-10-CM | POA: Diagnosis not present

## 2017-02-07 DIAGNOSIS — M816 Localized osteoporosis [Lequesne]: Secondary | ICD-10-CM | POA: Diagnosis not present

## 2017-02-07 DIAGNOSIS — F411 Generalized anxiety disorder: Secondary | ICD-10-CM | POA: Diagnosis not present

## 2017-02-07 DIAGNOSIS — Z6824 Body mass index (BMI) 24.0-24.9, adult: Secondary | ICD-10-CM | POA: Diagnosis not present

## 2017-02-07 DIAGNOSIS — E0590 Thyrotoxicosis, unspecified without thyrotoxic crisis or storm: Secondary | ICD-10-CM | POA: Diagnosis not present

## 2017-02-07 DIAGNOSIS — F339 Major depressive disorder, recurrent, unspecified: Secondary | ICD-10-CM | POA: Diagnosis not present

## 2017-02-07 DIAGNOSIS — R5383 Other fatigue: Secondary | ICD-10-CM | POA: Diagnosis not present

## 2017-02-07 DIAGNOSIS — R600 Localized edema: Secondary | ICD-10-CM | POA: Diagnosis not present

## 2017-02-07 DIAGNOSIS — M069 Rheumatoid arthritis, unspecified: Secondary | ICD-10-CM | POA: Diagnosis not present

## 2017-03-05 DIAGNOSIS — H2513 Age-related nuclear cataract, bilateral: Secondary | ICD-10-CM | POA: Diagnosis not present

## 2017-03-05 DIAGNOSIS — H43813 Vitreous degeneration, bilateral: Secondary | ICD-10-CM | POA: Diagnosis not present

## 2017-04-17 IMAGING — DX DG FOOT COMPLETE 3+V*R*
3 series · 3 of 3 positions shown · non-contrast
Comparison: Radiograph dated 10/28/2014

CLINICAL DATA: 74-year-old female with swelling, redness, and
drainage from the distal aspect of the right second toe.

EXAM:
RIGHT FOOT COMPLETE - 3+ VIEW

[foot ap]
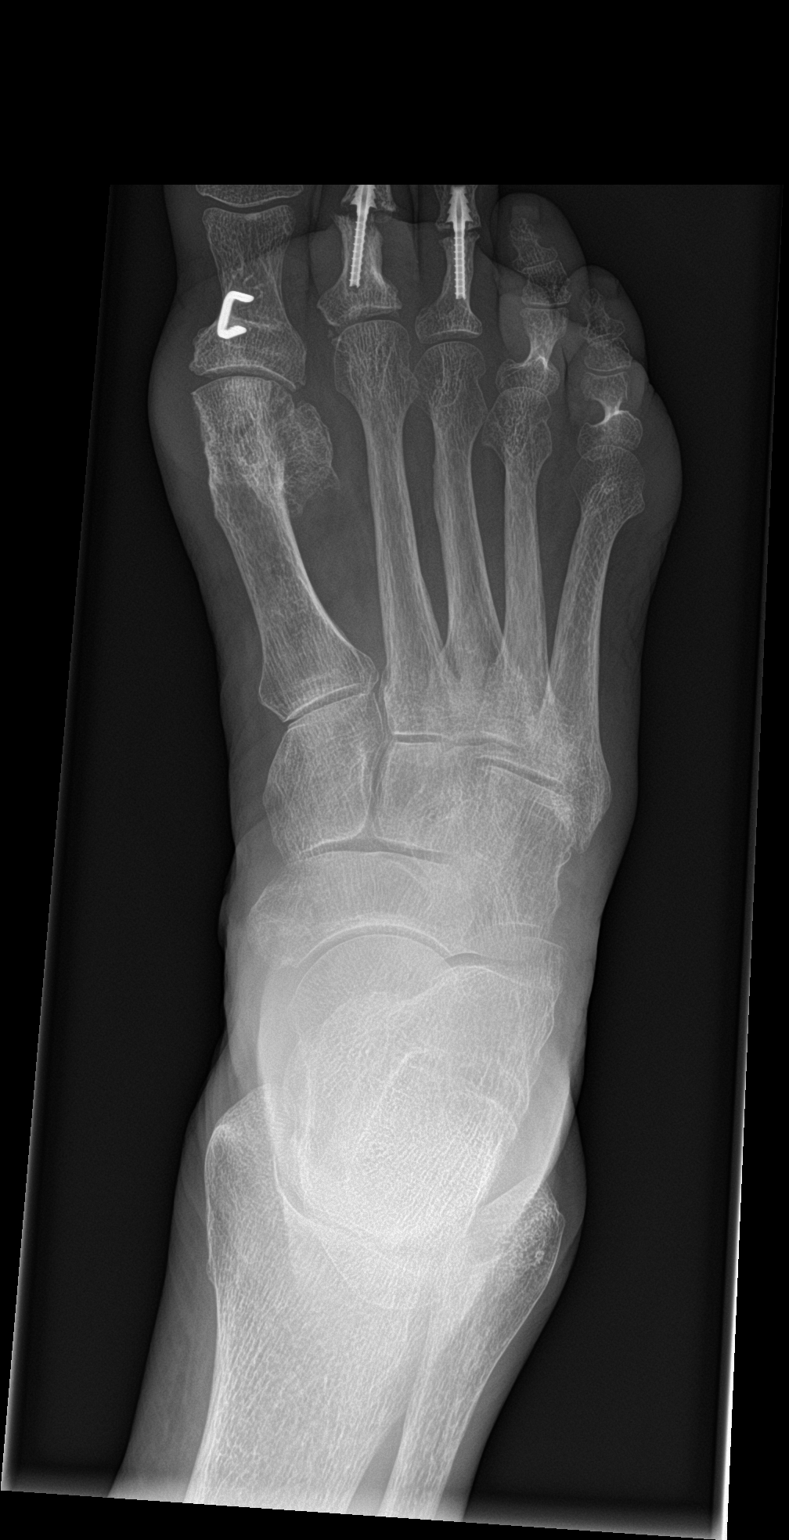

[foot obl]
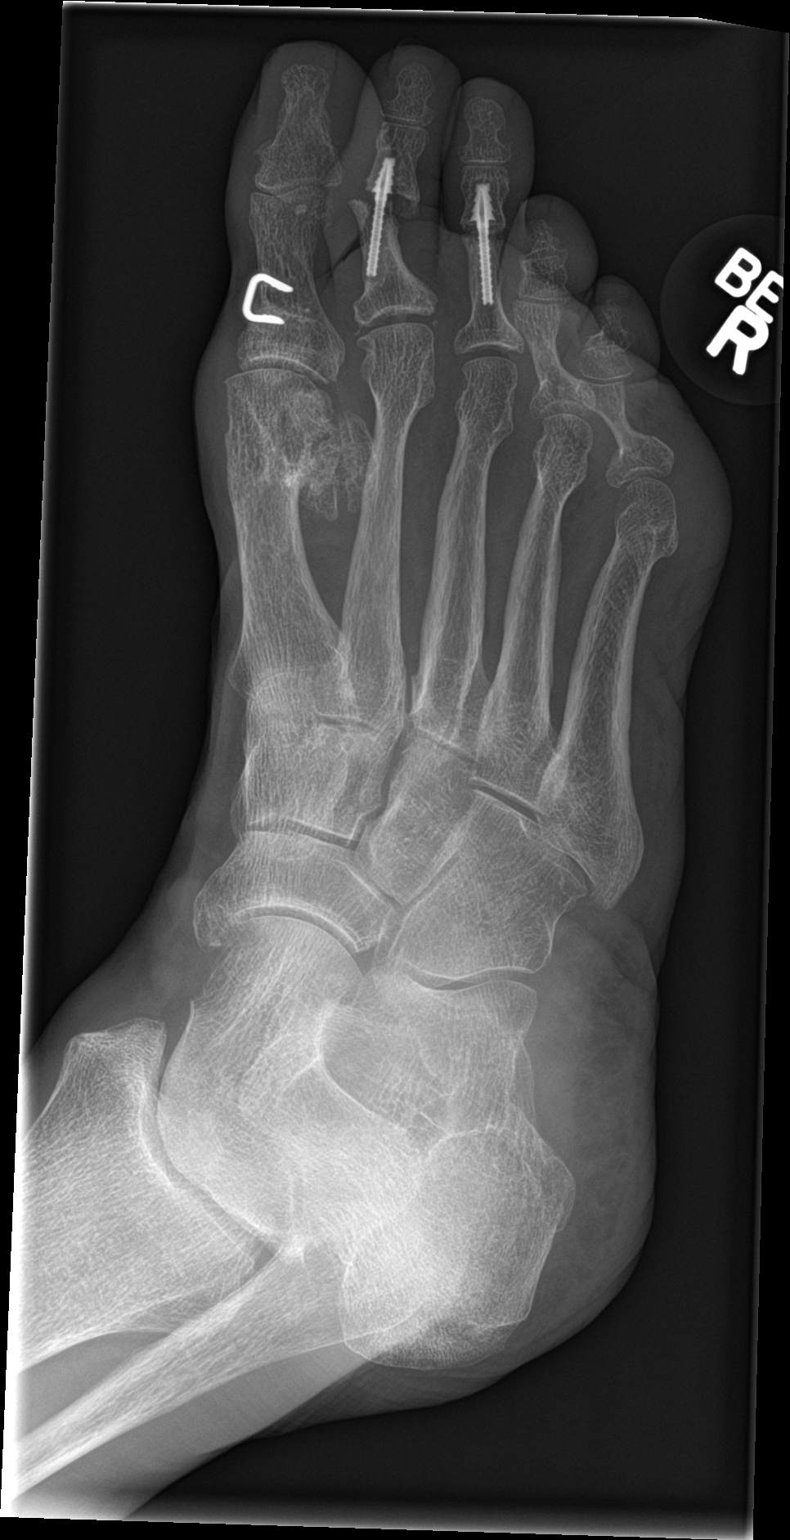

[foot lat]
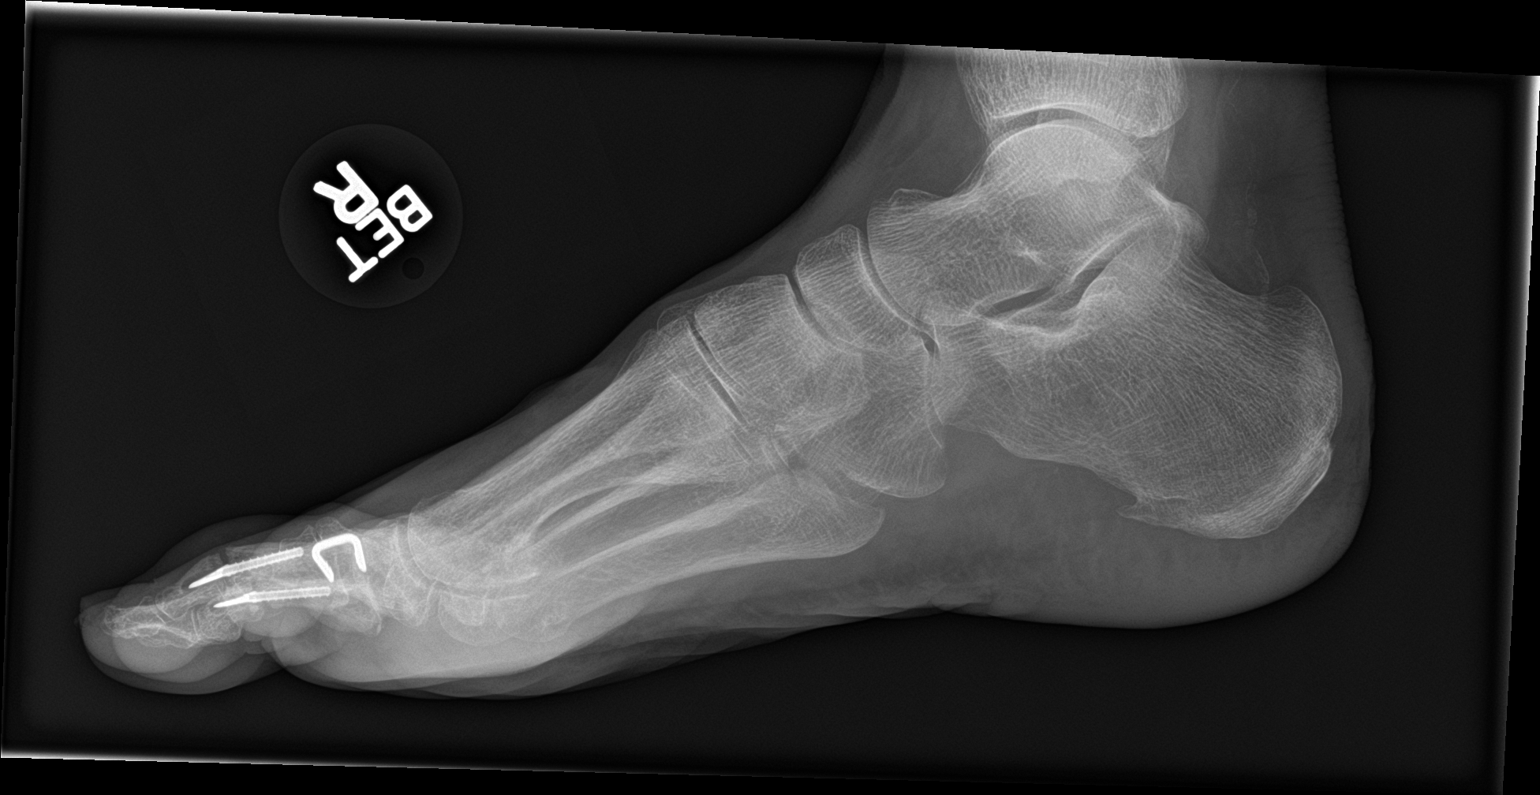

[3 of 3 positions shown; findings below may reference images not displayed]

FINDINGS: Stable postsurgical osteotomy of the proximal phalanges of the
first, second and third toes with fixation screws similar to prior
study. There is no acute fracture or dislocation. There is slight
elevation of the distal end of the proximal phalanx of the second
toe in relation to the middle phalanx on the oblique projection. The
bones are osteopenic. There is soft tissue swelling of the second
toe.
IMPRESSION: Stable appearing postsurgical changes of the first-third toe. Slight
elevated appearance of the proximal phalanx of the second toe in
relation to the middle phalanx on the oblique projection. Clinical
correlation is recommended. No acute fracture identified.

## 2017-04-23 DIAGNOSIS — M545 Low back pain: Secondary | ICD-10-CM | POA: Diagnosis not present

## 2017-04-23 DIAGNOSIS — M0579 Rheumatoid arthritis with rheumatoid factor of multiple sites without organ or systems involvement: Secondary | ICD-10-CM | POA: Diagnosis not present

## 2017-04-23 DIAGNOSIS — M8000XS Age-related osteoporosis with current pathological fracture, unspecified site, sequela: Secondary | ICD-10-CM | POA: Diagnosis not present

## 2017-07-03 ENCOUNTER — Ambulatory Visit: Payer: PPO

## 2017-07-10 ENCOUNTER — Ambulatory Visit (INDEPENDENT_AMBULATORY_CARE_PROVIDER_SITE_OTHER): Payer: PPO

## 2017-07-10 ENCOUNTER — Ambulatory Visit: Payer: PPO

## 2017-07-10 DIAGNOSIS — M80032S Age-related osteoporosis with current pathological fracture, left forearm, sequela: Secondary | ICD-10-CM | POA: Diagnosis not present

## 2017-07-10 MED ORDER — DENOSUMAB 60 MG/ML ~~LOC~~ SOLN
60.0000 mg | Freq: Once | SUBCUTANEOUS | Status: AC
  Administered 2017-07-10: 60 mg via SUBCUTANEOUS

## 2017-07-12 DIAGNOSIS — H2513 Age-related nuclear cataract, bilateral: Secondary | ICD-10-CM | POA: Diagnosis not present

## 2017-08-02 DIAGNOSIS — H2512 Age-related nuclear cataract, left eye: Secondary | ICD-10-CM | POA: Diagnosis not present

## 2017-08-02 NOTE — Patient Instructions (Signed)
Your procedure is scheduled on: 08/10/2017   Report to [facility] at  70   AM.  Call this number if you have problems the morning of surgery: [phone]   Do not eat food or drink liquids :After Midnight.      Take these medicines the morning of surgery with A SIP OF WATER: zyrtec, cymbalta, neurontin, hydrocodone, lisinopril, robaxin, prilosec.   Do not wear jewelry, make-up or nail polish.  Do not wear lotions, powders, or perfumes. You may wear deodorant.  Do not shave 48 hours prior to surgery.  Do not bring valuables to the hospital.  Contacts, dentures or bridgework may not be worn into surgery.  Leave suitcase in the car. After surgery it may be brought to your room.  For patients admitted to the hospital, checkout time is 11:00 AM the day of discharge.   Patients discharged the day of surgery will not be allowed to drive home.  :     Please read over the following fact sheets that you were given: Coughing and Deep Breathing, Surgical Site Infection Prevention, Anesthesia Post-op Instructions and Care and Recovery After Surgery    Cataract A cataract is a clouding of the lens of the eye. When a lens becomes cloudy, vision is reduced based on the degree and nature of the clouding. Many cataracts reduce vision to some degree. Some cataracts make people more near-sighted as they develop. Other cataracts increase glare. Cataracts that are ignored and become worse can sometimes look white. The white color can be seen through the pupil. CAUSES   Aging. However, cataracts may occur at any age, even in newborns.   Certain drugs.   Trauma to the eye.   Certain diseases such as diabetes.   Specific eye diseases such as chronic inflammation inside the eye or a sudden attack of a rare form of glaucoma.   Inherited or acquired medical problems.  SYMPTOMS   Gradual, progressive drop in vision in the affected eye.   Severe, rapid visual loss. This most often happens when trauma is  the cause.  DIAGNOSIS  To detect a cataract, an eye doctor examines the lens. Cataracts are best diagnosed with an exam of the eyes with the pupils enlarged (dilated) by drops.  TREATMENT  For an early cataract, vision may improve by using different eyeglasses or stronger lighting. If that does not help your vision, surgery is the only effective treatment. A cataract needs to be surgically removed when vision loss interferes with your everyday activities, such as driving, reading, or watching TV. A cataract may also have to be removed if it prevents examination or treatment of another eye problem. Surgery removes the cloudy lens and usually replaces it with a substitute lens (intraocular lens, IOL).  At a time when both you and your doctor agree, the cataract will be surgically removed. If you have cataracts in both eyes, only one is usually removed at a time. This allows the operated eye to heal and be out of danger from any possible problems after surgery (such as infection or poor wound healing). In rare cases, a cataract may be doing damage to your eye. In these cases, your caregiver may advise surgical removal right away. The vast majority of people who have cataract surgery have better vision afterward. HOME CARE INSTRUCTIONS  If you are not planning surgery, you may be asked to do the following:  Use different eyeglasses.   Use stronger or brighter lighting.   Ask your  eye doctor about reducing your medicine dose or changing medicines if it is thought that a medicine caused your cataract. Changing medicines does not make the cataract go away on its own.   Become familiar with your surroundings. Poor vision can lead to injury. Avoid bumping into things on the affected side. You are at a higher risk for tripping or falling.   Exercise extreme care when driving or operating machinery.   Wear sunglasses if you are sensitive to bright light or experiencing problems with glare.  SEEK IMMEDIATE  MEDICAL CARE IF:   You have a worsening or sudden vision loss.   You notice redness, swelling, or increasing pain in the eye.   You have a fever.  Document Released: 08/21/2005 Document Revised: 08/10/2011 Document Reviewed: 04/14/2011 [facility] Patient Information 2012 [place].PATIENT INSTRUCTIONS POST-ANESTHESIA  IMMEDIATELY FOLLOWING SURGERY:  Do not drive or operate machinery for the first twenty four hours after surgery.  Do not make any important decisions for twenty four hours after surgery or while taking narcotic pain medications or sedatives.  If you develop intractable nausea and vomiting or a severe headache please notify your doctor immediately.  FOLLOW-UP:  Please make an appointment with your surgeon as instructed. You do not need to follow up with anesthesia unless specifically instructed to do so.  WOUND CARE INSTRUCTIONS (if applicable):  Keep a dry clean dressing on the anesthesia/puncture wound site if there is drainage.  Once the wound has quit draining you may leave it open to air.  Generally you should leave the bandage intact for twenty four hours unless there is drainage.  If the epidural site drains for more than 36-48 hours please call the anesthesia department.  QUESTIONS?:  Please feel free to call your physician or the hospital operator if you have any questions, and they will be happy to assist you.

## 2017-08-06 ENCOUNTER — Encounter (HOSPITAL_COMMUNITY)
Admission: RE | Admit: 2017-08-06 | Discharge: 2017-08-06 | Disposition: A | Discharge status: Home / Self Care | Payer: PPO | Source: Ambulatory Visit | Attending: Ophthalmology | Admitting: Ophthalmology

## 2017-08-06 ENCOUNTER — Encounter (HOSPITAL_COMMUNITY): Payer: Self-pay

## 2017-08-06 ENCOUNTER — Other Ambulatory Visit: Payer: Self-pay

## 2017-08-06 DIAGNOSIS — Z01818 Encounter for other preprocedural examination: Secondary | ICD-10-CM | POA: Insufficient documentation

## 2017-08-06 DIAGNOSIS — Z01812 Encounter for preprocedural laboratory examination: Secondary | ICD-10-CM | POA: Insufficient documentation

## 2017-08-06 DIAGNOSIS — I10 Essential (primary) hypertension: Secondary | ICD-10-CM | POA: Diagnosis not present

## 2017-08-06 DIAGNOSIS — R9431 Abnormal electrocardiogram [ECG] [EKG]: Secondary | ICD-10-CM | POA: Diagnosis not present

## 2017-08-06 HISTORY — DX: Depression, unspecified: F32.A

## 2017-08-06 HISTORY — DX: Major depressive disorder, single episode, unspecified: F32.9

## 2017-08-06 HISTORY — DX: Anxiety disorder, unspecified: F41.9

## 2017-08-06 LAB — CBC
HEMATOCRIT: 41.7 % (ref 36.0–46.0)
HEMOGLOBIN: 13.1 g/dL (ref 12.0–15.0)
MCH: 30.8 pg (ref 26.0–34.0)
MCHC: 31.4 g/dL (ref 30.0–36.0)
MCV: 97.9 fL (ref 78.0–100.0)
Platelets: 149 10*3/uL — ABNORMAL LOW (ref 150–400)
RBC: 4.26 MIL/uL (ref 3.87–5.11)
RDW: 15.4 % (ref 11.5–15.5)
WBC: 4.1 10*3/uL (ref 4.0–10.5)

## 2017-08-06 LAB — BASIC METABOLIC PANEL
Anion gap: 6 (ref 5–15)
BUN: 14 mg/dL (ref 6–20)
CHLORIDE: 104 mmol/L (ref 101–111)
CO2: 26 mmol/L (ref 22–32)
Calcium: 9.5 mg/dL (ref 8.9–10.3)
Creatinine, Ser: 0.7 mg/dL (ref 0.44–1.00)
GFR calc Af Amer: >60 mL/min (ref >60)
GFR calc non Af Amer: >60 mL/min (ref >60)
GLUCOSE: 94 mg/dL (ref 65–99)
POTASSIUM: 4.1 mmol/L (ref 3.5–5.1)
SODIUM: 136 mmol/L (ref 135–145)

## 2017-08-09 MED ORDER — TETRACAINE HCL 0.5 % OP SOLN
OPHTHALMIC | Status: AC
  Filled 2017-08-09: qty 4

## 2017-08-09 MED ORDER — PHENYLEPHRINE HCL 2.5 % OP SOLN
OPHTHALMIC | Status: AC
  Filled 2017-08-09: qty 15

## 2017-08-09 MED ORDER — LIDOCAINE HCL 3.5 % OP GEL
OPHTHALMIC | Status: AC
  Filled 2017-08-09: qty 1

## 2017-08-09 MED ORDER — LIDOCAINE HCL (PF) 1 % IJ SOLN
INTRAMUSCULAR | Status: AC
  Filled 2017-08-09: qty 2

## 2017-08-09 MED ORDER — CYCLOPENTOLATE-PHENYLEPHRINE 0.2-1 % OP SOLN
OPHTHALMIC | Status: AC
  Filled 2017-08-09: qty 2

## 2017-08-09 MED ORDER — NEOMYCIN-POLYMYXIN-DEXAMETH 3.5-10000-0.1 OP SUSP
OPHTHALMIC | Status: AC
  Filled 2017-08-09: qty 5

## 2017-08-10 ENCOUNTER — Encounter (HOSPITAL_COMMUNITY)
Admission: RE | Disposition: A | Discharge status: Home / Self Care | Payer: Self-pay | Source: Ambulatory Visit | Attending: Ophthalmology

## 2017-08-10 ENCOUNTER — Ambulatory Visit (HOSPITAL_COMMUNITY): Payer: PPO | Admitting: Anesthesiology

## 2017-08-10 ENCOUNTER — Encounter (HOSPITAL_COMMUNITY): Payer: Self-pay | Admitting: *Deleted

## 2017-08-10 ENCOUNTER — Ambulatory Visit (HOSPITAL_COMMUNITY)
Admission: RE | Admit: 2017-08-10 | Discharge: 2017-08-10 | Disposition: A | Discharge status: Home / Self Care | Payer: PPO | Source: Ambulatory Visit | Attending: Ophthalmology | Admitting: Ophthalmology

## 2017-08-10 DIAGNOSIS — K219 Gastro-esophageal reflux disease without esophagitis: Secondary | ICD-10-CM | POA: Diagnosis not present

## 2017-08-10 DIAGNOSIS — F418 Other specified anxiety disorders: Secondary | ICD-10-CM | POA: Diagnosis not present

## 2017-08-10 DIAGNOSIS — H269 Unspecified cataract: Secondary | ICD-10-CM | POA: Insufficient documentation

## 2017-08-10 DIAGNOSIS — Z79899 Other long term (current) drug therapy: Secondary | ICD-10-CM | POA: Diagnosis not present

## 2017-08-10 DIAGNOSIS — E039 Hypothyroidism, unspecified: Secondary | ICD-10-CM | POA: Diagnosis not present

## 2017-08-10 DIAGNOSIS — I10 Essential (primary) hypertension: Secondary | ICD-10-CM | POA: Diagnosis not present

## 2017-08-10 DIAGNOSIS — E209 Hypoparathyroidism, unspecified: Secondary | ICD-10-CM | POA: Insufficient documentation

## 2017-08-10 DIAGNOSIS — H2512 Age-related nuclear cataract, left eye: Secondary | ICD-10-CM | POA: Diagnosis not present

## 2017-08-10 DIAGNOSIS — J449 Chronic obstructive pulmonary disease, unspecified: Secondary | ICD-10-CM | POA: Insufficient documentation

## 2017-08-10 DIAGNOSIS — M069 Rheumatoid arthritis, unspecified: Secondary | ICD-10-CM | POA: Insufficient documentation

## 2017-08-10 HISTORY — PX: CATARACT EXTRACTION W/PHACO: SHX586

## 2017-08-10 SURGERY — PHACOEMULSIFICATION, CATARACT, WITH IOL INSERTION
Anesthesia: Monitor Anesthesia Care | Site: Eye | Laterality: Left

## 2017-08-10 MED ORDER — ONDANSETRON 4 MG PO TBDP
4.0000 mg | ORAL_TABLET | Freq: Once | ORAL | Status: AC
  Administered 2017-08-10: 4 mg via ORAL

## 2017-08-10 MED ORDER — LACTATED RINGERS IV SOLN
INTRAVENOUS | Status: DC
Start: 2017-08-10 — End: 2017-08-10
  Administered 2017-08-10: 1000 mL via INTRAVENOUS

## 2017-08-10 MED ORDER — MIDAZOLAM HCL 2 MG/2ML IJ SOLN
1.0000 mg | Freq: Once | INTRAMUSCULAR | Status: AC | PRN
  Administered 2017-08-10: 1 mg via INTRAVENOUS

## 2017-08-10 MED ORDER — NEOMYCIN-POLYMYXIN-DEXAMETH 3.5-10000-0.1 OP SUSP
OPHTHALMIC | Status: DC | PRN
  Administered 2017-08-10: 2 [drp] via OPHTHALMIC

## 2017-08-10 MED ORDER — LIDOCAINE HCL 3.5 % OP GEL
1.0000 "application " | Freq: Once | OPHTHALMIC | Status: AC
  Administered 2017-08-10: 1 via OPHTHALMIC

## 2017-08-10 MED ORDER — TETRACAINE HCL 0.5 % OP SOLN
1.0000 [drp] | OPHTHALMIC | Status: AC
  Administered 2017-08-10 (×3): 1 [drp] via OPHTHALMIC

## 2017-08-10 MED ORDER — PHENYLEPHRINE HCL 2.5 % OP SOLN
1.0000 [drp] | OPHTHALMIC | Status: AC
  Administered 2017-08-10 (×3): 1 [drp] via OPHTHALMIC

## 2017-08-10 MED ORDER — POVIDONE-IODINE 5 % OP SOLN
OPHTHALMIC | Status: DC | PRN
  Administered 2017-08-10: 1 via OPHTHALMIC

## 2017-08-10 MED ORDER — SODIUM HYALURONATE 23 MG/ML IO SOLN
INTRAOCULAR | Status: DC | PRN
Start: 2017-08-10 — End: 2017-08-10
  Administered 2017-08-10: 0.6 mL via INTRAOCULAR

## 2017-08-10 MED ORDER — LIDOCAINE HCL (PF) 1 % IJ SOLN
INTRAMUSCULAR | Status: DC | PRN
Start: 2017-08-10 — End: 2017-08-10
  Administered 2017-08-10: 1 mL via OPHTHALMIC

## 2017-08-10 MED ORDER — BSS IO SOLN
INTRAOCULAR | Status: DC | PRN
  Administered 2017-08-10: 15 mL via INTRAOCULAR

## 2017-08-10 MED ORDER — EPINEPHRINE PF 1 MG/ML IJ SOLN
INTRAMUSCULAR | Status: DC | PRN
  Administered 2017-08-10: 500 mL

## 2017-08-10 MED ORDER — MIDAZOLAM HCL 2 MG/2ML IJ SOLN
INTRAMUSCULAR | Status: AC
  Filled 2017-08-10: qty 2

## 2017-08-10 MED ORDER — PROVISC 10 MG/ML IO SOLN
INTRAOCULAR | Status: DC | PRN
  Administered 2017-08-10: 0.85 mL via INTRAOCULAR

## 2017-08-10 MED ORDER — CYCLOPENTOLATE-PHENYLEPHRINE 0.2-1 % OP SOLN
1.0000 [drp] | OPHTHALMIC | Status: AC
  Administered 2017-08-10 (×3): 1 [drp] via OPHTHALMIC

## 2017-08-10 MED ORDER — ONDANSETRON 4 MG PO TBDP
ORAL_TABLET | ORAL | Status: AC
  Filled 2017-08-10: qty 1

## 2017-08-10 SURGICAL SUPPLY — 16 items

## 2017-08-10 NOTE — H&P (Signed)
The H and P was reviewed and updated. The patient was examined.  No changes were found after exam.  The surgical eye was marked.  

## 2017-08-10 NOTE — Anesthesia Preprocedure Evaluation (Addendum)
Anesthesia Evaluation  Patient identified by MRN, date of birth, ID band Patient awake    Airway Mallampati: I  TM Distance: >3 FB     Dental  (+) Teeth Intact   Pulmonary neg shortness of breath, COPD,  COPD inhaler,    Pulmonary exam normal        Cardiovascular Exercise Tolerance: Poor hypertension, Pt. on medications Normal cardiovascular exam Rhythm:Regular Rate:Normal  Normal sinus rhythm Possible Left atrial enlargement Cannot rule out Anterior infarct , age undetermined Abnormal ECG   (Dec 2018)   Neuro/Psych PSYCHIATRIC DISORDERS Anxiety Depression    GI/Hepatic Neg liver ROS, GERD  Medicated and Controlled,  Endo/Other  Hypothyroidism Hypoparathyroid  Renal/GU negative Renal ROS     Musculoskeletal  (+) Arthritis , Rheumatoid disorders,    Abdominal Normal abdominal exam  (+)   Peds  Hematology   Anesthesia Other Findings   Reproductive/Obstetrics                           Anesthesia Physical Anesthesia Plan  ASA: III  Anesthesia Plan: MAC   Post-op Pain Management:    Induction:   PONV Risk Score and Plan:   Airway Management Planned: Nasal Cannula  Additional Equipment:   Intra-op Plan:   Post-operative Plan:   Informed Consent: I have reviewed the patients History and Physical, chart, labs and discussed the procedure including the risks, benefits and alternatives for the proposed anesthesia with the patient or authorized representative who has indicated his/her understanding and acceptance.   Dental advisory given  Plan Discussed with: CRNA  Anesthesia Plan Comments:         Anesthesia Quick Evaluation

## 2017-08-10 NOTE — Anesthesia Postprocedure Evaluation (Signed)
Anesthesia Post Note  Patient: [name]  Procedure(s) Performed: CATARACT EXTRACTION PHACO AND INTRAOCULAR LENS PLACEMENT LEFT EYE (Left Eye)  Patient location during evaluation: Short Stay Anesthesia Type: MAC Level of consciousness: awake and alert, oriented and patient cooperative Pain management: pain level controlled Vital Signs Assessment: post-procedure vital signs reviewed and stable Respiratory status: spontaneous breathing and respiratory function stable Cardiovascular status: stable Postop Assessment: no apparent nausea or vomiting Anesthetic complications: no     Last Vitals:  Vitals:   08/10/17 0715 08/10/17 0720  BP: 136/68 125/69  Pulse:    Resp: 19 15  Temp:    SpO2: 98% 100%    Last Pain:  Vitals:   08/10/17 0647  TempSrc: Oral  PainSc: 4                  Gean Laursen A

## 2017-08-10 NOTE — Op Note (Signed)
Date of procedure: 08/10/17  Pre-operative diagnosis: Visually significant cataract, Left Eye  Post-operative diagnosis: Visually significant cataract, Left Eye  Procedure: Removal of cataract via phacoemulsification and insertion of intra-ocular lens AMO PCB00  +24.0D into the capsular bag of the Left Eye  Attending surgeon: Gerda Diss. Almando Brawley, MD, MA  Anesthesia: MAC, Topical Akten  Complications: None  Estimated Blood Loss: <79m (minimal)  Specimens: None  Implants: As above  Indications:  Visually significant cataract, Left Eye  Procedure:  The patient was seen and identified in the pre-operative area. The operative eye was identified and dilated.  The operative eye was marked.  Topical anesthesia was administered to the operative eye.     The patient was then to the operative suite and placed in the supine position.  A timeout was performed confirming the patient, procedure to be performed, and all other relevant information.   The patient's face was prepped and draped in the usual fashion for intra-ocular surgery.  A lid speculum was placed into the operative eye and the surgical microscope moved into place and focused.  A superotemporal paracentesis was created using a 20 gauge paracentesis blade.  Shugarcaine was injected into the anterior chamber.  Viscoelastic was injected into the anterior chamber.  A temporal clear-corneal main wound incision was created using a 2.415mmicrokeratome.  A continuous curvilinear capsulorrhexis was initiated using an irrigating cystitome and completed using capsulorrhexis forceps.  Hydrodissection and hydrodeliniation were performed.  Viscoelastic was injected into the anterior chamber.  A phacoemulsification handpiece and a chopper as a second instrument were used to remove the nucleus and epinucleus. The irrigation/aspiration handpiece was used to remove any remaining cortical material.   The capsular bag was reinflated with viscoelastic, checked,  and found to be intact.  The intraocular lens was inserted into the capsular bag and dialed into place using a Kuglen hook.  The irrigation/aspiration handpiece was used to remove any remaining viscoelastic.  The clear corneal wound and paracentesis wounds were then hydrated and checked with Weck-Cels to be watertight.  The lid-speculum and drape was removed, and the patient's face was cleaned with a wet and dry 4x4.  Maxitrol was instilled in the eye before a clear shield was taped over the eye. The patient was taken to the post-operative care unit in good condition, having tolerated the procedure well.  Post-Op Instructions: [facility] and will receive all other orders and instructions.

## 2017-08-10 NOTE — Anesthesia Procedure Notes (Signed)
Procedure Name: MAC Date/Time: 08/10/2017 7:26 AM Performed by: Andree Elk Tayleigh Wetherell A, CRNA Pre-anesthesia Checklist: Patient identified, Emergency Drugs available, Suction available, Patient being monitored and Timeout performed Oxygen Delivery Method: Nasal cannula

## 2017-08-10 NOTE — Discharge Instructions (Signed)
Please discharge patient when stable, will follow up today with Dr. Marisa Hua at the [facility] office at 8:30AM today.  Leave shield in place until visit.  All paperwork with discharge instructions will be given at the office.  PATIENT INSTRUCTIONS POST-ANESTHESIA  IMMEDIATELY FOLLOWING SURGERY:  Do not drive or operate machinery for the first twenty four hours after surgery.  Do not make any important decisions for twenty four hours after surgery or while taking narcotic pain medications or sedatives.  If you develop intractable nausea and vomiting or a severe headache please notify your doctor immediately.  FOLLOW-UP:  Please make an appointment with your surgeon as instructed. You do not need to follow up with anesthesia unless specifically instructed to do so.  WOUND CARE INSTRUCTIONS (if applicable):  Keep a dry clean dressing on the anesthesia/puncture wound site if there is drainage.  Once the wound has quit draining you may leave it open to air.  Generally you should leave the bandage intact for twenty four hours unless there is drainage.  If the epidural site drains for more than 36-48 hours please call the anesthesia department.  QUESTIONS?:  Please feel free to call your physician or the hospital operator if you have any questions, and they will be happy to assist you.

## 2017-08-10 NOTE — Transfer of Care (Signed)
Immediate Anesthesia Transfer of Care Note  Patient: [name]  Procedure(s) Performed: CATARACT EXTRACTION PHACO AND INTRAOCULAR LENS PLACEMENT LEFT EYE (Left Eye)  Patient Location: Short Stay  Anesthesia Type:MAC  Level of Consciousness: awake, alert , oriented and patient cooperative  Airway & Oxygen Therapy: Patient Spontanous Breathing  Post-op Assessment: Report given to RN and Post -op Vital signs reviewed and stable  Post vital signs: Reviewed and stable  Last Vitals:  Vitals:   08/10/17 0715 08/10/17 0720  BP: 136/68 125/69  Pulse:    Resp: 19 15  Temp:    SpO2: 98% 100%    Last Pain:  Vitals:   08/10/17 0647  TempSrc: Oral  PainSc: 4       Patients Stated Pain Goal: 8 (43/83/81 [id number])  Complications: No apparent anesthesia complications

## 2017-08-14 ENCOUNTER — Encounter (HOSPITAL_COMMUNITY): Payer: Self-pay | Admitting: Ophthalmology

## 2017-09-03 ENCOUNTER — Encounter (HOSPITAL_COMMUNITY)
Admission: RE | Admit: 2017-09-03 | Discharge: 2017-09-03 | Disposition: A | Discharge status: Home / Self Care | Payer: PPO | Source: Ambulatory Visit | Attending: Ophthalmology | Admitting: Ophthalmology

## 2017-09-03 ENCOUNTER — Encounter (HOSPITAL_COMMUNITY): Payer: Self-pay

## 2017-09-03 DIAGNOSIS — H2511 Age-related nuclear cataract, right eye: Secondary | ICD-10-CM | POA: Diagnosis not present

## 2017-09-07 ENCOUNTER — Encounter (HOSPITAL_COMMUNITY)
Admission: RE | Disposition: A | Discharge status: Home / Self Care | Payer: Self-pay | Source: Ambulatory Visit | Attending: Ophthalmology

## 2017-09-07 ENCOUNTER — Encounter (HOSPITAL_COMMUNITY): Payer: Self-pay

## 2017-09-07 ENCOUNTER — Ambulatory Visit (HOSPITAL_COMMUNITY)
Admission: RE | Admit: 2017-09-07 | Discharge: 2017-09-07 | Disposition: A | Discharge status: Home / Self Care | Payer: PPO | Source: Ambulatory Visit | Attending: Ophthalmology | Admitting: Ophthalmology

## 2017-09-07 ENCOUNTER — Ambulatory Visit (HOSPITAL_COMMUNITY): Payer: PPO | Admitting: Anesthesiology

## 2017-09-07 DIAGNOSIS — M069 Rheumatoid arthritis, unspecified: Secondary | ICD-10-CM | POA: Insufficient documentation

## 2017-09-07 DIAGNOSIS — J449 Chronic obstructive pulmonary disease, unspecified: Secondary | ICD-10-CM | POA: Insufficient documentation

## 2017-09-07 DIAGNOSIS — Z888 Allergy status to other drugs, medicaments and biological substances status: Secondary | ICD-10-CM | POA: Diagnosis not present

## 2017-09-07 DIAGNOSIS — H2511 Age-related nuclear cataract, right eye: Secondary | ICD-10-CM | POA: Diagnosis not present

## 2017-09-07 DIAGNOSIS — F419 Anxiety disorder, unspecified: Secondary | ICD-10-CM | POA: Diagnosis not present

## 2017-09-07 DIAGNOSIS — I10 Essential (primary) hypertension: Secondary | ICD-10-CM | POA: Insufficient documentation

## 2017-09-07 DIAGNOSIS — H268 Other specified cataract: Secondary | ICD-10-CM | POA: Insufficient documentation

## 2017-09-07 DIAGNOSIS — Z853 Personal history of malignant neoplasm of breast: Secondary | ICD-10-CM | POA: Insufficient documentation

## 2017-09-07 DIAGNOSIS — E039 Hypothyroidism, unspecified: Secondary | ICD-10-CM | POA: Diagnosis not present

## 2017-09-07 DIAGNOSIS — K219 Gastro-esophageal reflux disease without esophagitis: Secondary | ICD-10-CM | POA: Diagnosis not present

## 2017-09-07 DIAGNOSIS — F329 Major depressive disorder, single episode, unspecified: Secondary | ICD-10-CM | POA: Insufficient documentation

## 2017-09-07 DIAGNOSIS — E209 Hypoparathyroidism, unspecified: Secondary | ICD-10-CM | POA: Diagnosis not present

## 2017-09-07 HISTORY — PX: CATARACT EXTRACTION W/PHACO: SHX586

## 2017-09-07 SURGERY — PHACOEMULSIFICATION, CATARACT, WITH IOL INSERTION
Anesthesia: Monitor Anesthesia Care | Laterality: Right

## 2017-09-07 MED ORDER — CYCLOPENTOLATE-PHENYLEPHRINE 0.2-1 % OP SOLN
1.0000 [drp] | OPHTHALMIC | Status: AC
  Administered 2017-09-07 (×3): 1 [drp] via OPHTHALMIC

## 2017-09-07 MED ORDER — PROVISC 10 MG/ML IO SOLN
INTRAOCULAR | Status: DC | PRN
Start: 2017-09-07 — End: 2017-09-07
  Administered 2017-09-07: 0.85 mL via INTRAOCULAR

## 2017-09-07 MED ORDER — FENTANYL CITRATE (PF) 100 MCG/2ML IJ SOLN
25.0000 ug | Freq: Once | INTRAMUSCULAR | Status: AC
  Administered 2017-09-07: 25 ug via INTRAVENOUS
  Filled 2017-09-07: qty 2

## 2017-09-07 MED ORDER — LIDOCAINE HCL 3.5 % OP GEL
1.0000 "application " | Freq: Once | OPHTHALMIC | Status: AC
  Administered 2017-09-07: 1 via OPHTHALMIC

## 2017-09-07 MED ORDER — MIDAZOLAM HCL 2 MG/2ML IJ SOLN
1.0000 mg | INTRAMUSCULAR | Status: AC
  Administered 2017-09-07: 1 mg via INTRAVENOUS
  Filled 2017-09-07: qty 2

## 2017-09-07 MED ORDER — SODIUM HYALURONATE 23 MG/ML IO SOLN
INTRAOCULAR | Status: DC | PRN
  Administered 2017-09-07: 0.6 mL via INTRAOCULAR

## 2017-09-07 MED ORDER — EPINEPHRINE PF 1 MG/ML IJ SOLN
INTRAMUSCULAR | Status: AC
  Filled 2017-09-07: qty 2

## 2017-09-07 MED ORDER — BSS IO SOLN
INTRAOCULAR | Status: DC | PRN
  Administered 2017-09-07: 50 mL

## 2017-09-07 MED ORDER — TETRACAINE HCL 0.5 % OP SOLN
1.0000 [drp] | OPHTHALMIC | Status: AC
  Administered 2017-09-07 (×3): 1 [drp] via OPHTHALMIC

## 2017-09-07 MED ORDER — LACTATED RINGERS IV SOLN
INTRAVENOUS | Status: DC
  Administered 2017-09-07: 08:00:00 via INTRAVENOUS

## 2017-09-07 MED ORDER — PHENYLEPHRINE HCL 2.5 % OP SOLN
1.0000 [drp] | OPHTHALMIC | Status: AC
  Administered 2017-09-07 (×3): 1 [drp] via OPHTHALMIC

## 2017-09-07 MED ORDER — POVIDONE-IODINE 5 % OP SOLN
OPHTHALMIC | Status: DC | PRN
  Administered 2017-09-07: 1 via OPHTHALMIC

## 2017-09-07 MED ORDER — LIDOCAINE HCL (PF) 1 % IJ SOLN
INTRAOCULAR | Status: DC | PRN
  Administered 2017-09-07: 1 mL via OPHTHALMIC

## 2017-09-07 MED ORDER — NEOMYCIN-POLYMYXIN-DEXAMETH 3.5-10000-0.1 OP SUSP
OPHTHALMIC | Status: DC | PRN
  Administered 2017-09-07: 2 [drp] via OPHTHALMIC

## 2017-09-07 MED ORDER — BSS IO SOLN
INTRAOCULAR | Status: DC | PRN
  Administered 2017-09-07: 15 mL via INTRAOCULAR

## 2017-09-07 SURGICAL SUPPLY — 18 items
CLOTH BEACON ORANGE TIMEOUT ST (SAFETY) ×2 IMPLANT
EYE SHIELD UNIVERSAL CLEAR (GAUZE/BANDAGES/DRESSINGS) ×2 IMPLANT
GLOVE BIOGEL PI IND STRL 6.5 (GLOVE) IMPLANT
GLOVE BIOGEL PI IND STRL 7.0 (GLOVE) IMPLANT
GLOVE BIOGEL PI IND STRL 7.5 (GLOVE) IMPLANT
GLOVE BIOGEL PI INDICATOR 6.5 (GLOVE) ×2
GLOVE BIOGEL PI INDICATOR 7.0 (GLOVE) ×2
GLOVE BIOGEL PI INDICATOR 7.5 (GLOVE) ×2
GLOVE EXAM NITRILE LRG STRL (GLOVE) IMPLANT
GLOVE EXAM NITRILE MD LF STRL (GLOVE) IMPLANT
LENS ALC ACRYL/TECN (Ophthalmic Related) ×2 IMPLANT
NDL HYPO 18GX1.5 BLUNT FILL (NEEDLE) IMPLANT
NEEDLE HYPO 18GX1.5 BLUNT FILL (NEEDLE) ×3 IMPLANT
PAD ARMBOARD 7.5X6 YLW CONV (MISCELLANEOUS) ×2 IMPLANT
RING MALYGIN (MISCELLANEOUS) IMPLANT
SYR TB 1ML LL NO SAFETY (SYRINGE) ×2 IMPLANT
VISCOELASTIC ADDITIONAL (OPHTHALMIC RELATED) ×2 IMPLANT
WATER STERILE IRR 250ML POUR (IV SOLUTION) ×2 IMPLANT

## 2017-09-07 NOTE — Anesthesia Postprocedure Evaluation (Signed)
Anesthesia Post Note  Patient: [name]  Procedure(s) Performed: CATARACT EXTRACTION PHACO AND INTRAOCULAR LENS PLACEMENT RIGHT EYE (Right )  Patient location during evaluation: Short Stay Anesthesia Type: MAC Level of consciousness: awake Pain management: pain level controlled Vital Signs Assessment: post-procedure vital signs reviewed and stable Respiratory status: spontaneous breathing, nonlabored ventilation and respiratory function stable Cardiovascular status: blood pressure returned to baseline Postop Assessment: no apparent nausea or vomiting Anesthetic complications: no     Last Vitals:  Vitals:   09/07/17 0709 09/07/17 0750  BP: 131/87 133/66  Pulse: 77   Resp:  15  Temp: 36.9 C   SpO2: 93% 97%    Last Pain:  Vitals:   09/07/17 0709  TempSrc: Oral  PainSc: 5                  Isabele Lollar J

## 2017-09-07 NOTE — Op Note (Signed)
Date of procedure: 09/07/17  Pre-operative diagnosis: Visually significant cataract, Right Eye  Post-operative diagnosis: Visually significant cataract, Right Eye  Procedure: Removal of cataract via phacoemulsification and insertion of intra-ocular lens AMO PCB00  +23.0D into the capsular bag of the Right Eye  Attending surgeon: Gerda Diss. Kaylon Laroche, MD, MA  Anesthesia: MAC, Topical Akten  Complications: None  Estimated Blood Loss: <35m (minimal)  Specimens: None  Implants: As above  Indications:  Visually significant cataract, Right Eye  Procedure:  The patient was seen and identified in the pre-operative area. The operative eye was identified and dilated.  The operative eye was marked.  Topical anesthesia was administered to the operative eye.     The patient was then to the operative suite and placed in the supine position.  A timeout was performed confirming the patient, procedure to be performed, and all other relevant information.   The patient's face was prepped and draped in the usual fashion for intra-ocular surgery.  A lid speculum was placed into the operative eye and the surgical microscope moved into place and focused.  A superotemporal paracentesis was created using a 20 gauge paracentesis blade.  Shugarcaine was injected into the anterior chamber.  Viscoelastic was injected into the anterior chamber.  A temporal clear-corneal main wound incision was created using a 2.422mmicrokeratome.  A continuous curvilinear capsulorrhexis was initiated using an irrigating cystitome and completed using capsulorrhexis forceps.  Hydrodissection and hydrodeliniation were performed.  Viscoelastic was injected into the anterior chamber.  A phacoemulsification handpiece and a chopper as a second instrument were used to remove the nucleus and epinucleus. The irrigation/aspiration handpiece was used to remove any remaining cortical material.   The capsular bag was reinflated with viscoelastic,  checked, and found to be intact.  The intraocular lens was inserted into the capsular bag and dialed into place using a Kuglen hook.  The irrigation/aspiration handpiece was used to remove any remaining viscoelastic.  The clear corneal wound and paracentesis wounds were then hydrated and checked with Weck-Cels to be watertight.  The lid-speculum and drape was removed, and the patient's face was cleaned with a wet and dry 4x4.  Maxitrol was instilled in the eye before a clear shield was taped over the eye. The patient was taken to the post-operative care unit in good condition, having tolerated the procedure well.  Post-Op Instructions: [facility] and will receive all other orders and instructions.

## 2017-09-07 NOTE — Transfer of Care (Signed)
Immediate Anesthesia Transfer of Care Note  Patient: [name]  Procedure(s) Performed: CATARACT EXTRACTION PHACO AND INTRAOCULAR LENS PLACEMENT RIGHT EYE (Right )  Patient Location: PACU  Anesthesia Type:MAC  Level of Consciousness: awake and patient cooperative  Airway & Oxygen Therapy: Patient Spontanous Breathing  Post-op Assessment: Report given to RN and Post -op Vital signs reviewed and stable  Post vital signs: Reviewed and stable  Last Vitals:  Vitals:   09/07/17 0709 09/07/17 0750  BP: 131/87 133/66  Pulse: 77   Resp:  15  Temp: 36.9 C   SpO2: 93% 97%    Last Pain:  Vitals:   09/07/17 0709  TempSrc: Oral  PainSc: 5          Complications: No apparent anesthesia complications

## 2017-09-07 NOTE — Discharge Instructions (Signed)
Please discharge patient when stable, will follow up today with Dr. Vikkie Goeden at the [facility] office immediately following discharge.  Leave shield in place until visit.  All paperwork with discharge instructions will be given at the office. ° °

## 2017-09-07 NOTE — Anesthesia Preprocedure Evaluation (Signed)
Anesthesia Evaluation  Patient identified by MRN, date of birth, ID band Patient awake    Reviewed: Allergy & Precautions, NPO status , Patient's Chart, lab work & pertinent test results  Airway Mallampati: I  TM Distance: >3 FB     Dental  (+) Teeth Intact   Pulmonary neg shortness of breath, COPD,  COPD inhaler,    Pulmonary exam normal        Cardiovascular Exercise Tolerance: Poor hypertension, Pt. on medications Normal cardiovascular exam Rhythm:Regular Rate:Normal  Normal sinus rhythm Possible Left atrial enlargement Cannot rule out Anterior infarct , age undetermined Abnormal ECG   (Dec 2018)   Neuro/Psych PSYCHIATRIC DISORDERS Anxiety Depression    GI/Hepatic Neg liver ROS, GERD  Medicated and Controlled,  Endo/Other  Hypothyroidism Hypoparathyroid  Renal/GU negative Renal ROS     Musculoskeletal  (+) Arthritis , Rheumatoid disorders,    Abdominal Normal abdominal exam  (+)   Peds  Hematology   Anesthesia Other Findings   Reproductive/Obstetrics                             Anesthesia Physical Anesthesia Plan  ASA: III  Anesthesia Plan: MAC   Post-op Pain Management:    Induction:   PONV Risk Score and Plan:   Airway Management Planned: Nasal Cannula  Additional Equipment:   Intra-op Plan:   Post-operative Plan:   Informed Consent: I have reviewed the patients History and Physical, chart, labs and discussed the procedure including the risks, benefits and alternatives for the proposed anesthesia with the patient or authorized representative who has indicated his/her understanding and acceptance.   Dental advisory given  Plan Discussed with: CRNA  Anesthesia Plan Comments:         Anesthesia Quick Evaluation

## 2017-09-07 NOTE — H&P (Signed)
The H and P was reviewed and updated. The patient was examined.  No changes were found after exam.  The surgical eye was marked.  

## 2017-09-10 ENCOUNTER — Encounter (HOSPITAL_COMMUNITY): Payer: Self-pay | Admitting: Ophthalmology

## 2017-09-27 ENCOUNTER — Ambulatory Visit (HOSPITAL_COMMUNITY)
Admission: RE | Admit: 2017-09-27 | Discharge: 2017-09-27 | Disposition: A | Discharge status: Home / Self Care | Payer: PPO | Source: Ambulatory Visit | Attending: Internal Medicine | Admitting: Internal Medicine

## 2017-09-27 ENCOUNTER — Other Ambulatory Visit (HOSPITAL_COMMUNITY): Payer: Self-pay | Admitting: Internal Medicine

## 2017-09-27 DIAGNOSIS — M4854XA Collapsed vertebra, not elsewhere classified, thoracic region, initial encounter for fracture: Secondary | ICD-10-CM | POA: Diagnosis not present

## 2017-09-27 DIAGNOSIS — S3992XA Unspecified injury of lower back, initial encounter: Secondary | ICD-10-CM | POA: Diagnosis not present

## 2017-09-27 DIAGNOSIS — R296 Repeated falls: Secondary | ICD-10-CM | POA: Diagnosis not present

## 2017-09-27 DIAGNOSIS — M8440XA Pathological fracture, unspecified site, initial encounter for fracture: Secondary | ICD-10-CM | POA: Diagnosis present

## 2017-09-27 DIAGNOSIS — M5136 Other intervertebral disc degeneration, lumbar region: Secondary | ICD-10-CM | POA: Insufficient documentation

## 2017-09-27 DIAGNOSIS — M4186 Other forms of scoliosis, lumbar region: Secondary | ICD-10-CM | POA: Insufficient documentation

## 2017-09-27 DIAGNOSIS — W19XXXA Unspecified fall, initial encounter: Secondary | ICD-10-CM

## 2017-09-27 DIAGNOSIS — E0510 Thyrotoxicosis with toxic single thyroid nodule without thyrotoxic crisis or storm: Secondary | ICD-10-CM | POA: Diagnosis not present

## 2017-09-27 DIAGNOSIS — I10 Essential (primary) hypertension: Secondary | ICD-10-CM | POA: Diagnosis not present

## 2017-09-27 DIAGNOSIS — M545 Low back pain: Secondary | ICD-10-CM | POA: Diagnosis not present

## 2017-10-30 DIAGNOSIS — M810 Age-related osteoporosis without current pathological fracture: Secondary | ICD-10-CM | POA: Diagnosis not present

## 2017-10-30 DIAGNOSIS — E039 Hypothyroidism, unspecified: Secondary | ICD-10-CM | POA: Diagnosis not present

## 2017-10-30 DIAGNOSIS — I10 Essential (primary) hypertension: Secondary | ICD-10-CM | POA: Diagnosis not present

## 2017-10-30 DIAGNOSIS — E782 Mixed hyperlipidemia: Secondary | ICD-10-CM | POA: Diagnosis not present

## 2017-10-30 DIAGNOSIS — F411 Generalized anxiety disorder: Secondary | ICD-10-CM | POA: Diagnosis not present

## 2017-11-01 DIAGNOSIS — M21372 Foot drop, left foot: Secondary | ICD-10-CM | POA: Diagnosis not present

## 2017-11-01 DIAGNOSIS — M0579 Rheumatoid arthritis with rheumatoid factor of multiple sites without organ or systems involvement: Secondary | ICD-10-CM | POA: Diagnosis not present

## 2017-11-01 DIAGNOSIS — M545 Low back pain: Secondary | ICD-10-CM | POA: Diagnosis not present

## 2017-11-01 DIAGNOSIS — M8000XS Age-related osteoporosis with current pathological fracture, unspecified site, sequela: Secondary | ICD-10-CM | POA: Diagnosis not present

## 2017-11-28 DIAGNOSIS — G3184 Mild cognitive impairment, so stated: Secondary | ICD-10-CM | POA: Diagnosis not present

## 2017-11-28 DIAGNOSIS — H547 Unspecified visual loss: Secondary | ICD-10-CM | POA: Diagnosis not present

## 2017-11-28 DIAGNOSIS — M25519 Pain in unspecified shoulder: Secondary | ICD-10-CM | POA: Diagnosis not present

## 2017-11-28 DIAGNOSIS — Z6826 Body mass index (BMI) 26.0-26.9, adult: Secondary | ICD-10-CM | POA: Diagnosis not present

## 2017-11-28 DIAGNOSIS — G501 Atypical facial pain: Secondary | ICD-10-CM | POA: Diagnosis not present

## 2017-11-28 DIAGNOSIS — R2689 Other abnormalities of gait and mobility: Secondary | ICD-10-CM | POA: Diagnosis not present

## 2017-11-28 DIAGNOSIS — R296 Repeated falls: Secondary | ICD-10-CM | POA: Diagnosis not present

## 2017-12-03 DIAGNOSIS — M4727 Other spondylosis with radiculopathy, lumbosacral region: Secondary | ICD-10-CM | POA: Diagnosis not present

## 2017-12-03 DIAGNOSIS — M4150 Other secondary scoliosis, site unspecified: Secondary | ICD-10-CM | POA: Diagnosis not present

## 2017-12-03 DIAGNOSIS — M810 Age-related osteoporosis without current pathological fracture: Secondary | ICD-10-CM | POA: Diagnosis not present

## 2017-12-03 DIAGNOSIS — M4317 Spondylolisthesis, lumbosacral region: Secondary | ICD-10-CM | POA: Diagnosis not present

## 2017-12-04 DIAGNOSIS — G3184 Mild cognitive impairment, so stated: Secondary | ICD-10-CM | POA: Diagnosis not present

## 2017-12-04 DIAGNOSIS — R296 Repeated falls: Secondary | ICD-10-CM | POA: Diagnosis not present

## 2017-12-04 DIAGNOSIS — E039 Hypothyroidism, unspecified: Secondary | ICD-10-CM | POA: Diagnosis not present

## 2017-12-04 DIAGNOSIS — I10 Essential (primary) hypertension: Secondary | ICD-10-CM | POA: Diagnosis not present

## 2017-12-04 DIAGNOSIS — E0510 Thyrotoxicosis with toxic single thyroid nodule without thyrotoxic crisis or storm: Secondary | ICD-10-CM | POA: Diagnosis not present

## 2017-12-04 DIAGNOSIS — Z79891 Long term (current) use of opiate analgesic: Secondary | ICD-10-CM | POA: Diagnosis not present

## 2017-12-04 DIAGNOSIS — M25519 Pain in unspecified shoulder: Secondary | ICD-10-CM | POA: Diagnosis not present

## 2017-12-04 DIAGNOSIS — R2689 Other abnormalities of gait and mobility: Secondary | ICD-10-CM | POA: Diagnosis not present

## 2017-12-04 DIAGNOSIS — Z6826 Body mass index (BMI) 26.0-26.9, adult: Secondary | ICD-10-CM | POA: Diagnosis not present

## 2017-12-04 DIAGNOSIS — F411 Generalized anxiety disorder: Secondary | ICD-10-CM | POA: Diagnosis not present

## 2017-12-04 DIAGNOSIS — M545 Low back pain: Secondary | ICD-10-CM | POA: Diagnosis not present

## 2017-12-04 DIAGNOSIS — H547 Unspecified visual loss: Secondary | ICD-10-CM | POA: Diagnosis not present

## 2017-12-04 DIAGNOSIS — G501 Atypical facial pain: Secondary | ICD-10-CM | POA: Diagnosis not present

## 2017-12-04 DIAGNOSIS — E782 Mixed hyperlipidemia: Secondary | ICD-10-CM | POA: Diagnosis not present

## 2017-12-10 DIAGNOSIS — G629 Polyneuropathy, unspecified: Secondary | ICD-10-CM | POA: Diagnosis not present

## 2017-12-10 DIAGNOSIS — M5416 Radiculopathy, lumbar region: Secondary | ICD-10-CM | POA: Diagnosis not present

## 2017-12-13 ENCOUNTER — Other Ambulatory Visit: Payer: Self-pay | Admitting: Internal Medicine

## 2017-12-13 DIAGNOSIS — M80032S Age-related osteoporosis with current pathological fracture, left forearm, sequela: Secondary | ICD-10-CM

## 2017-12-13 DIAGNOSIS — Z961 Presence of intraocular lens: Secondary | ICD-10-CM | POA: Diagnosis not present

## 2017-12-13 DIAGNOSIS — M810 Age-related osteoporosis without current pathological fracture: Secondary | ICD-10-CM | POA: Diagnosis not present

## 2017-12-13 DIAGNOSIS — I10 Essential (primary) hypertension: Secondary | ICD-10-CM | POA: Diagnosis not present

## 2017-12-13 DIAGNOSIS — M4150 Other secondary scoliosis, site unspecified: Secondary | ICD-10-CM | POA: Diagnosis not present

## 2017-12-13 DIAGNOSIS — G629 Polyneuropathy, unspecified: Secondary | ICD-10-CM | POA: Diagnosis not present

## 2017-12-13 NOTE — Progress Notes (Signed)
Called by Dr. Ashok Pall: Need another bone mineral density before patient's surgery.  We will order this at [facility]. and forward the results to him.

## 2017-12-28 ENCOUNTER — Ambulatory Visit (INDEPENDENT_AMBULATORY_CARE_PROVIDER_SITE_OTHER): Payer: PPO | Admitting: Internal Medicine

## 2017-12-28 VITALS — BP 118/68 | HR 76 | Resp 16 | Ht 59.45 in | Wt 132.0 lb

## 2017-12-28 DIAGNOSIS — M80032S Age-related osteoporosis with current pathological fracture, left forearm, sequela: Secondary | ICD-10-CM | POA: Diagnosis not present

## 2017-12-28 LAB — VITAMIN D 25 HYDROXY (VIT D DEFICIENCY, FRACTURES): VITD: 67.09 ng/mL (ref 30.00–100.00)

## 2017-12-28 NOTE — Progress Notes (Signed)
Patient ID: [name], female   DOB: Apr 21, 1941, 77 y.o.   MRN: [id number]    HPI  [name] is a 77 y.o.-year-old female, initially referred by her Ditty, for management of osteoporosis, with the specific request to start Teriparatide.  Last visit a year ago.  No fracture since last visit, but she fell few times, once hitting head (1 mo ago).   She has severe scoliosis and will need surgery.  However, her spine bone density needs to be improved. We have been unsuccessful to get her insurance to approve Teriparatide or Abaloparatide, despite preauthorization's, appeals, and intervention from the company representative.  We were able to start Prolia and she had 2 injections so far.  Reviewed and addended history: Pt was dx with OP in 2000's.  I reviewed pt's DEXA scans: Date L1-L4 T score FN T score 33% distal Radius  12/23/2015 n/a RFN: -2.2 LFN: -2.0 -3.8 (-11%*)  01/02/2012 n/a RFN: -2.2 LFN: -2.2 -3.0   No dizziness/vertigo/orthostasis/poor vision.  She had 1 fracture - L forearm ([place] fx) - 2000s  Reviewed previous OP treatments:  - Calcium since 1985 - Fosamax since ~10 years ago - Prolia - since 12/2016 (had 2 doses)  No history of vitamin D deficiency.  Reviewed previous levels:  Lab Results  Component Value Date   VD25OH 65.38 10/06/2016   Pt is on calcium-vitD + vitamin D -2000 IU. Also Mg 500 mg daily. She also eats a little dairy and green, leafy, vegetables.   No weightbearing exercises because of back pain.  She walks with a walker.  No history of steroid injections.  Menopause was at 77 y/o.   Pt does have a FH of osteoporosis: Dowager hump in mother and GM.  No h/o hyper/hypocalcemia or hyperparathyroidism.  No history of kidney stones. Lab Results  Component Value Date   PTH 18 10/06/2016   PTH Comment 10/06/2016   CALCIUM 9.5 08/06/2017   CALCIUM 9.5 10/06/2016   CALCIUM 9.2 03/06/2016   CALCIUM 9.3 09/14/2015   CALCIUM 9.5  10/23/2014   CALCIUM 9.8 06/03/2007   No thyrotoxicosis. Reviewed TSH recent levels:  09/08/2016: TSH 0.48, free T4 0.9 Lab Results  Component Value Date   TSH 1.467 11/20/2013   No CKD. Last BUN/Cr: Lab Results  Component Value Date   BUN 14 08/06/2017   CREATININE 0.70 08/06/2017  09/08/2016: BUN 10/creatinine 0.75, EGFR 77  She has a h/o Br CA s/p RxTx and ChTx.  She was on tamoxifen and then Aromasin.  She had normal 24-hour urine calcium Component     Latest Ref Rng & Units 10/06/2016        11:02 AM  Calcium, Ur     Not estab mg/dL 14  Calcium, 24 hour urine     35 - 250 mg/24 h 140  Creatinine, Urine     20 - 320 mg/dL 109  Creatinine, 24H Ur     0.63 - 2.50 g/24 h 1.09   ROS: Constitutional: no weight gain/no weight loss, no fatigue, no subjective hyperthermia, no subjective hypothermia Eyes: no blurry vision, no xerophthalmia ENT: no sore throat, no nodules palpated in throat,+ dysphagia, no odynophagia, no hoarseness Cardiovascular: no CP/no SOB/no palpitations/no leg swelling Respiratory: no cough/no SOB/no wheezing Gastrointestinal: no N/no V/no D/no C/no acid reflux Musculoskeletal: no muscle aches/no joint aches Skin: no rashes, no hair loss Neurological: +no tremors/no numbness/no tingling/no dizziness  I reviewed pt's medications, allergies, PMH, social hx, family hx, and  changes were documented in the history of present illness. Otherwise, unchanged from my initial visit note. Started Namenda.  Past Medical History:  Diagnosis Date  . Anxiety   . Arm fracture, left   . Breast cancer ([place])   . Bulging disc   . Depression   . Difficulty swallowing solids   . GERD (gastroesophageal reflux disease)   . Hypertension   . Hypothyroidism   . Invasive ductal carcinoma of left breast ([place]) 02/24/2014  . Neuropathy   . Osteoporosis   . Rheumatoid arteritis    RA  . Rotator cuff disorder, right   . Scoliosis   . Shortness of breath dyspnea    "I have  reactive airway disease".   Past Surgical History:  Procedure Laterality Date  . ABDOMINAL HYSTERECTOMY    . Barbie Banner OSTEOTOMY Right 10/28/2014   Procedure: Barbie Banner OSTEOTOMY RIGHT FOOT;  Surgeon: Marcheta Grammes, DPM;  Location: [facility];  Service: Podiatry;  Laterality: Right;  . BONE BIOPSY  09/29/2015   Procedure: BONE BIOPSY AND BONE CULTURE SECOND TOE RIGHT FOOT;  Surgeon: Caprice Beaver, DPM;  Location: [facility];  Service: Podiatry;;  . BREAST SURGERY Left    lumpectomy and axillary lymph node with re-excision 2 weeks later   . BUNIONECTOMY Right 10/28/2014   Procedure: SILVER BUNIONECTOMY RIGHT FOOT;  Surgeon: Marcheta Grammes, DPM;  Location: [facility];  Service: Podiatry;  Laterality: Right;  . CATARACT EXTRACTION W/PHACO Left 08/10/2017   Procedure: CATARACT EXTRACTION PHACO AND INTRAOCULAR LENS PLACEMENT LEFT EYE;  Surgeon: Baruch Goldmann, MD;  Location: [facility];  Service: Ophthalmology;  Laterality: Left;  CDE: 13.81  . CATARACT EXTRACTION W/PHACO Right 09/07/2017   Procedure: CATARACT EXTRACTION PHACO AND INTRAOCULAR LENS PLACEMENT RIGHT EYE;  Surgeon: Baruch Goldmann, MD;  Location: [facility];  Service: Ophthalmology;  Laterality: Right;  right  . COLONOSCOPY    . DILATION AND CURETTAGE OF UTERUS    . esophageal stricture    . HAMMER TOE SURGERY Right 10/28/2014   Procedure: HAMMER TOE CORRECTION 2ND AND 3RD TOE RIGHT FOOT;  Surgeon: Marcheta Grammes, DPM;  Location: [facility];  Service: Podiatry;  Laterality: Right;  . LUMBAR LAMINECTOMY/DECOMPRESSION MICRODISCECTOMY Left 03/13/2016   Procedure: Left Lumbar Four-Five, Lumbar Five-Sacral One Laminotomy/Foraminotomy;  Surgeon: Kevan Ny Ditty, MD;  [facility];  Service: Neurosurgery;  Laterality: Left;  . REMOVAL OF IMPLANT Right 09/29/2015   Procedure: REMOVAL OF IMPLANT 2ND TOE RIGHT FOOT;  Surgeon: Caprice Beaver, DPM;  Location: [facility];  Service: Podiatry;  Laterality: Right;   Social History   Social  History  . Marital status: Widowed    Spouse name: N/A  . Number of children: 2   Occupational History  . retired   Social History Main Topics  . Smoking status: Never Smoker  . Smokeless tobacco: Never Used  . Alcohol use No  . Drug use: No   Current Outpatient Medications on File Prior to Visit  Medication Sig Dispense Refill  . albuterol (PROAIR HFA) 108 (90 Base) MCG/ACT inhaler Inhale 2 puffs into the lungs every 6 (six) hours as needed for wheezing or shortness of breath.     . Calcium Carbonate-Vitamin D (CALTRATE 600+D) 600-400 MG-UNIT tablet Take 1 tablet by mouth 2 (two) times daily.    . cetirizine (ZYRTEC) 10 MG tablet Take 10 mg by mouth daily as needed for allergies.     . Cholecalciferol (VITAMIN D) 2000 UNITS CAPS Take 2,000 Units by mouth daily.     Marland Kitchen  denosumab (PROLIA) 60 MG/ML SOLN injection Inject 60 mg into the skin every 6 (six) months. Administer in upper arm, thigh, or abdomen    . DULoxetine (CYMBALTA) 60 MG capsule Take 60 mg by mouth daily.    . folic acid (FOLVITE) 643 MCG tablet Take 800 mcg by mouth daily.    Marland Kitchen gabapentin (NEURONTIN) 300 MG capsule Take 1 capsule (300 mg total) by mouth 3 (three) times daily. 90 capsule 2  . HYDROcodone-homatropine (HYDROMET) 5-1.5 MG/5ML syrup Take 5 mLs by mouth every 6 (six) hours as needed for cough.    Marland Kitchen lisinopril (PRINIVIL,ZESTRIL) 10 MG tablet Take 5 mg by mouth daily.    . Magnesium 500 MG CAPS Take 500 mg by mouth daily.     . methotrexate (RHEUMATREX) 2.5 MG tablet Take 22.5 mg by mouth once weekly on Tuesday    . naproxen sodium (ANAPROX) 220 MG tablet Take 220 mg by mouth 2 (two) times daily as needed (for pain or headache).     Marland Kitchen omeprazole (PRILOSEC) 20 MG capsule Take 20 mg by mouth daily.    Marland Kitchen OVER THE COUNTER MEDICATION Take 240 mLs by mouth daily. Quinine tonic water    . potassium chloride (K-DUR) 10 MEQ tablet Take 10 mEq by mouth at bedtime.    . senna (EQ NATURAL VEGETABLE LAXATIVE) 8.6 MG tablet  Take 1 tablet by mouth daily as needed for constipation.    . Zinc 50 MG CAPS Take 50 mg by mouth daily.      No current facility-administered medications on file prior to visit.    Allergies  Allergen Reactions  . Celecoxib Swelling and Other (See Comments)    NO SPECIFIED AREA  . Ibuprofen Swelling and Other (See Comments)    NO SPECIFIED AREA  . Diphenhydramine Rash and Other (See Comments)    Wide awake.   Micheline Maze [Propoxyphene N-Acetaminophen] Nausea And Vomiting   Family History  Problem Relation Age of Onset  . Hypertension Mother   . Cancer Maternal Grandmother   . Cancer Cousin     PE: BP 118/68   Pulse 76   Resp 16   Ht 4' 11.45" (1.51 m)   Wt 132 lb (59.9 kg)   SpO2 98%   BMI 26.26 kg/m  Wt Readings from Last 3 Encounters:  12/28/17 132 lb (59.9 kg)  09/07/17 133 lb (60.3 kg)  08/10/17 133 lb (60.3 kg)   Constitutional: normal weight, in NAD, + mild kyphosis Eyes: PERRLA, EOMI, no exophthalmos ENT: moist mucous membranes, no thyromegaly, no cervical lymphadenopathy Cardiovascular: RRR, No MRG Respiratory: CTA B Gastrointestinal: abdomen soft, NT, ND, BS+ Musculoskeletal: no deformities, strength intact in all 4 Skin: moist, warm, no rashes Neurological: + tremor with outstretched hands, DTR normal in all 4  Assessment: 1. Osteoporosis  Plan: 1. Osteoporosis - Likely age-related + menopausal + secondary to RA + secondary to Aromasin therapy, + familial (mother and GM with Dowager hump) - Patient has been on Fosamax for approximately 10 years before our last visit a year ago but she has also been on Zegerid, which can reduce the absorption of Fosamax and calcium.  Her femoral neck BMD is comparable between the bone density reports from 2017 and 2013, however, her 33% distal radius BMD (reflecting the quality of her cortical bone) has decreased significantly at last check, compared to 2015.  She also has a history of radial fracture.  Her investigation  for secondary causes for osteoporosis has been negative,  including a parathyroid hormone that was normal. - Patient has been referred to me for helping with improving her bone density before scoliosis surgery.  She would have been an ideal candidate for teriparatide/abaloparatide, however, these were not covered by insurance.  We gave her 2 doses of Prolia and she is due for a third now.  She is tolerating this very well, without generalized pain, without thigh/hip pain, and also without jaw pain.  She has no dental work coming up, but had some dental extraction after her fall.  - She is due for another bone density scan, which I ordered before this visit, after I discussed with Dr. Loyola Mast who is planning to do her scoliosis surgery. - We will check a BMP and a vitamin D today - will see pt back in 1 year  - time spent with the patient and her son: 25 min, of which >50% was spent in obtaining information about her symptoms, reviewing her previous labs, evaluations, and treatments, counseling her about her conditionand developing a plan to further investigate and treat it; she had a number of questions which I addressed.  Component     Latest Ref Rng & Units 12/28/2017  Glucose     65 - 99 mg/dL 111 (H)  BUN     7 - 25 mg/dL 14  Creatinine     0.60 - 0.93 mg/dL 0.88  GFR, Est Non African American     > OR = 60 mL/min/1.94m 63  GFR, Est African American     > OR = 60 mL/min/1.741m73  BUN/Creatinine Ratio     6 - 22 (calc) NOT APPLICABLE  Sodium     13396 146 mmol/L 141  Potassium     3.5 - 5.3 mmol/L 3.6  Chloride     98 - 110 mmol/L 104  CO2     20 - 32 mmol/L 34 (H)  Calcium     8.6 - 10.4 mg/dL 9.7  VITD     30.00 - 100.00 ng/mL 67.09   Glucose slightly high, the rest of the labs are normal.  CrPhilemon KingdomMD PhD [facility]

## 2017-12-28 NOTE — Patient Instructions (Signed)
Check at home what dose of vitamin D and calcium in a day.  Please stop at the lab.  Call Forestine Na to check on the DXA scan schedule.  Please come back for a follow-up appointment in 1 year.

## 2017-12-29 LAB — BASIC METABOLIC PANEL WITH GFR
BUN: 14 mg/dL (ref 7–25)
CO2: 34 mmol/L — AB (ref 20–32)
Calcium: 9.7 mg/dL (ref 8.6–10.4)
Chloride: 104 mmol/L (ref 98–110)
Creat: 0.88 mg/dL (ref 0.60–0.93)
GFR, EST NON AFRICAN AMERICAN: 63 mL/min/{1.73_m2} (ref >=60)
GFR, Est African American: 73 mL/min/{1.73_m2} (ref >=60)
Glucose, Bld: 111 mg/dL — ABNORMAL HIGH (ref 65–99)
Potassium: 3.6 mmol/L (ref 3.5–5.3)
SODIUM: 141 mmol/L (ref 135–146)

## 2017-12-31 ENCOUNTER — Encounter: Payer: Self-pay | Admitting: Internal Medicine

## 2017-12-31 ENCOUNTER — Telehealth: Payer: Self-pay

## 2017-12-31 NOTE — Telephone Encounter (Signed)
-----   Message from Philemon Kingdom, MD sent at 12/31/2017 12:40 PM EDT ----- Larey Seat, can you please call pt: Glucose slightly high, the rest of the labs are normal.  Continue current dose of vitamin D supplement.

## 2017-12-31 NOTE — Telephone Encounter (Signed)
Spoke to patient. Gave results and med instructions. Patient verbalized understanding.  

## 2018-01-02 DIAGNOSIS — R413 Other amnesia: Secondary | ICD-10-CM | POA: Diagnosis not present

## 2018-01-03 DIAGNOSIS — R2689 Other abnormalities of gait and mobility: Secondary | ICD-10-CM | POA: Diagnosis not present

## 2018-01-03 DIAGNOSIS — Z79891 Long term (current) use of opiate analgesic: Secondary | ICD-10-CM | POA: Diagnosis not present

## 2018-01-03 DIAGNOSIS — E782 Mixed hyperlipidemia: Secondary | ICD-10-CM | POA: Diagnosis not present

## 2018-01-03 DIAGNOSIS — M25519 Pain in unspecified shoulder: Secondary | ICD-10-CM | POA: Diagnosis not present

## 2018-01-03 DIAGNOSIS — R296 Repeated falls: Secondary | ICD-10-CM | POA: Diagnosis not present

## 2018-01-03 DIAGNOSIS — I10 Essential (primary) hypertension: Secondary | ICD-10-CM | POA: Diagnosis not present

## 2018-01-03 DIAGNOSIS — E0510 Thyrotoxicosis with toxic single thyroid nodule without thyrotoxic crisis or storm: Secondary | ICD-10-CM | POA: Diagnosis not present

## 2018-01-03 DIAGNOSIS — M545 Low back pain: Secondary | ICD-10-CM | POA: Diagnosis not present

## 2018-01-03 DIAGNOSIS — F411 Generalized anxiety disorder: Secondary | ICD-10-CM | POA: Diagnosis not present

## 2018-01-03 DIAGNOSIS — G3184 Mild cognitive impairment, so stated: Secondary | ICD-10-CM | POA: Diagnosis not present

## 2018-02-21 ENCOUNTER — Ambulatory Visit (INDEPENDENT_AMBULATORY_CARE_PROVIDER_SITE_OTHER): Payer: PPO

## 2018-02-21 ENCOUNTER — Telehealth: Payer: Self-pay

## 2018-02-21 DIAGNOSIS — M80032S Age-related osteoporosis with current pathological fracture, left forearm, sequela: Secondary | ICD-10-CM | POA: Diagnosis not present

## 2018-02-21 MED ORDER — DENOSUMAB 60 MG/ML ~~LOC~~ SOSY
60.0000 mg | PREFILLED_SYRINGE | Freq: Once | SUBCUTANEOUS | Status: AC
  Administered 2018-02-21: 60 mg via SUBCUTANEOUS

## 2018-02-21 NOTE — Telephone Encounter (Signed)
Spoke to [place], appt is 03/13/18 at 1015AM. No Ca 48 hours prior. Bring copy of medication list. Son was given number to change appt time since he said she prefers afternoon.

## 2018-02-21 NOTE — Telephone Encounter (Addendum)
Patient came in today for Prolia injection and requested we look into her Dexa scan that was ordered on 12/13/17- patient stated she has not been called to schedule this but wants to have this done at [facility]- patient has requested that any of her medical concerns be addressed with her son [name] and his number is [phone]

## 2018-02-21 NOTE — Progress Notes (Signed)
Per orders of Dr. Cruzita Lederer injection of Prolia given today by Linus Galas, CMA . Patient tolerated injection well.

## 2018-02-26 DIAGNOSIS — G8929 Other chronic pain: Secondary | ICD-10-CM | POA: Diagnosis not present

## 2018-02-26 DIAGNOSIS — J340 Abscess, furuncle and carbuncle of nose: Secondary | ICD-10-CM | POA: Diagnosis not present

## 2018-02-26 DIAGNOSIS — Z6825 Body mass index (BMI) 25.0-25.9, adult: Secondary | ICD-10-CM | POA: Diagnosis not present

## 2018-02-26 DIAGNOSIS — F0390 Unspecified dementia without behavioral disturbance: Secondary | ICD-10-CM | POA: Diagnosis not present

## 2018-02-26 DIAGNOSIS — M545 Low back pain: Secondary | ICD-10-CM | POA: Diagnosis not present

## 2018-02-26 DIAGNOSIS — H9190 Unspecified hearing loss, unspecified ear: Secondary | ICD-10-CM | POA: Diagnosis not present

## 2018-03-01 ENCOUNTER — Other Ambulatory Visit: Payer: Self-pay | Admitting: *Deleted

## 2018-03-01 NOTE — Patient Outreach (Signed)
[name].) Care Management  03/01/2018  [name] 1940-11-05 [id number]   Telephone Screen  Referral Date: 02/27/18  Referral Source: MD referral Dr Nevada Crane Referral Reason: please accept this for [name] services to provide available resources - living will, medication reconciliation and support system- Multiple falls unknown of exact cause Insurance:HTA  Outreach attempt # 1 successful  Patient is able to verify HIPAA Reviewed and addressed referral to [name] with patient [name] speaks in a slow pace and prefers to write down things she wants to remember After Samantha referral reason was reviewed she states she falls "too many times" and had to falls and "blanked out" Injury to nose mouth, tooth, head  I have not got my strength back yet"  She has been seen by home health for physical therapy previous but can not recall Samantha name of Samantha agency reports she needs her bathroom finished to assist with safety At intervals [name] needed redirecting back to Samantha subject at hand with noted hearing concerns as [name] had to repeat statements at intervals  Social: States she lives with her son, [name], but then states he "farms close by and he can not be in Samantha house all Samantha time cause he has to take care of Samantha farm." Has a daughter, [name] that stayed with her for a while 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  She states she has stopped driving since this last fall She is unable to tell [name] Samantha date or month of Samantha last fall The MD records sent with Samantha referral states fall 2 weeks possible before Samantha 11/28/17 office visit  She reports poor writing related to arthritis  Conditions: vertebral compression fx rheumatoid arthritis falls, HTN, MD notes states dementia on Aricept seen by neuro psychiatrist, [name] on 01/02/18 (Dx major neurocognitive disorder likely due to alzheimer's disease, unspecified depressive disorder, unspecified anxiety disorder plus request she  refrain from driving)  osteoporosis, anxiety/depression, hx of neoplasm of left breast and thyroid gland, back pain, fx of left wrist   Medications: patient voices unsure if taking too much medicine and if they are causing side effects  Appointments: She can not recall her next follow up appointment MD records indicate last seen on 02/26/18 and once a month generally   Advance Directives: she states she does not have one but her parents did and is agreeing to [name] SW referral for advance directives as also recommended by Dr Nevada Crane in referral    Consent: [name] reviewed [name] services with patient. Patient gave verbal consent for services for [name], Pharmacy and SW   [name]: [facility] RN [name] will refer Samantha Rutten to [facility] community RN [name] for increase falls, possible ned for home visits to coordinate home care, ? Lives alone, and disease management [facility] SW for advance directive (living will and support services per MD referral request), transportation needs, community resources and possible caregiver resources [name] pharmacy for medication reconciliation and polypharmacy concerns  Samantha Torrez L. Lavina Hamman, RN, BSN, CCM [name] Telephonic Care Management Care Coordinator Direct number [phone]  Main [name] number [phone] Fax number [phone]

## 2018-03-05 ENCOUNTER — Other Ambulatory Visit: Payer: Self-pay

## 2018-03-05 NOTE — Patient Outreach (Signed)
[facility] [facility]) Care Management  03/05/2018  AMIRI TRITCH 01-14-1941 [id number]   77 year old female eferred to [facility] by Dr. Nevada Crane.  [place] services requested for polypharmacy and medication reconciliation.  PMHx includes, but not limited to, neoplasm of thyroid, osteoporosis and rheumatoid arthritis.  Successful outreach to Ms. Stevens.  HIPAA identifiers verified.     Subjective: Ms. Ahart states that she is "getting disgusted and tired of not getting anywhere."  She states that she is confused by her medications and is thinking she might stop taking them.  She states her daughter, [name] helps her when she is not working and also her son [name] helps her out.  I informed her that I would like to do a home visit to look at her medications.  She states that she would like me to speak with her son [name].  She reports he is out mowing and trying to get "it done before the ran comes."  She requests that I call him back on Monday afternoon.   Plan: Outreach attempt on Monday afternoon with son [name].  Will assess if he would like me to do a home visit.    [name], PharmD Clinical Pharmacist [place] [phone]

## 2018-03-06 ENCOUNTER — Telehealth: Payer: Self-pay

## 2018-03-06 NOTE — Telephone Encounter (Signed)
Patient Son is needing to talk to someone about the [employer].  They stated that they are needing the patient diagnosis for the reasons for the Prolia and the dx code.  Please advise

## 2018-03-06 NOTE — Telephone Encounter (Signed)
This is in regards to her prolia please advise pts son

## 2018-03-08 ENCOUNTER — Other Ambulatory Visit: Payer: Self-pay

## 2018-03-08 NOTE — Patient Outreach (Addendum)
[facility]) Care Management  03/08/2018  CAILEY TRIGUEROS April 09, 1941 [id number]    Referral notification received from [place] for evaluation by Community RN CM. Initial referral placed by member's primary care provider.  Outreach call placed to Ms. Cabana. HIPAA verifiers obtained. Ms. Hayter stated that she did not need [facility] nursing services at this time and requested that RN CM speak with her son/caregiver [name].  RN CM discussed the services provided by the [facility] Man[facility] and the availability of telephonic nursing outreach. Per [name], Ms. Szymczak did not need additional nursing services, and he was unclear as to why a referral was placed. He confirmed that Ms. Vasco experienced multiple falls in the past, but reported that he would speak with Dr. Nevada Crane about therapy if needed. He also reported a possible discussion with pharmacy regarding medications. [name] stated "we need to hold off on this." Ms. Buonomo and [name] felt that [facility] nursing services were not needed at this time, but both were agreeable to a follow up call next week. Ms. Pavelko will inform RN CM of her decision at that time.   PLAN RN CM will follow up on next week.    Ricardo [phone]

## 2018-03-11 ENCOUNTER — Other Ambulatory Visit: Payer: Self-pay

## 2018-03-11 ENCOUNTER — Ambulatory Visit: Payer: Self-pay

## 2018-03-11 NOTE — Patient Outreach (Signed)
[place] [facility]) Care Management  03/11/2018  ELLON MARASCO [birth year] [id number]  77 year old female eferred to [place] [facility] by Dr. Nevada Crane.  [place] services requested for polypharmacy and medication reconciliation.  PMHx includes, but not limited to, neoplasm of thyroid, osteoporosis and rheumatoid arthritis.  Successful outreach to Ms. Mentel' son, [name].  HIPAA identifiers verified.  Patient had requested that I outreach to him for her medication review.  Subjective: Mr. Hagemeister states that his mom doesn't like "fooling with her medications."  Mr. Spadafora was not at his mother's house during my outreach.  He stated that his mom was not on many medications and that most of her medications were over the counter.  He was not able to answer specific dosage questions when I began to go through her medication list. When suggested, he reported that he would ask his mother if she would allow me to do a home visit, but he thinks that she will decline.  I suggested that I could call him back anytime when he is at her house to complete the medication review when he has her prescription bottles with him.  He requested that I call him back Friday at 1 pm.    Plan: Outreach to Mr. Debrosse on Friday at 1 pm.   [name], [place] [phone]

## 2018-03-13 ENCOUNTER — Other Ambulatory Visit (HOSPITAL_COMMUNITY): Payer: PPO

## 2018-03-15 ENCOUNTER — Other Ambulatory Visit: Payer: Self-pay

## 2018-03-15 ENCOUNTER — Ambulatory Visit: Payer: Self-pay

## 2018-03-15 NOTE — Patient Outreach (Signed)
[place] [facility]) Care Management  03/15/2018  CHANCE KARAM 10/07/1940 [id number]   RN CM placed follow up call regarding member's decision to utilize [facility] nursing services. Nursing services were declined.     PLAN RN CM will complete case closure.    [place] [phone]

## 2018-03-18 ENCOUNTER — Other Ambulatory Visit: Payer: Self-pay | Admitting: *Deleted

## 2018-03-19 ENCOUNTER — Other Ambulatory Visit: Payer: Self-pay

## 2018-03-19 NOTE — Patient Outreach (Addendum)
[facility]) Care Management  03/19/2018  [name] [birth year] [id number]  77year old femaleeferred to [name] byDr. Nevada Crane.[name] services requested for polypharmacy and medication reconciliation.PMHx includes, but not limited to, neoplasm of thyroid, osteoporosis and rheumatoid arthritis.  [name] will forward patient to [name] Pharmacist within Dr. Juel Burrow practice to complete polypharmacy medication review.  Plan: Close [place] case.  Route discipline closure letter to Dr. Nevada Crane.   Route closure note, to [facility] care team members, Raynaldo Opitz and [employer].  [name], PharmD Clinical Pharmacist [place] [phone]

## 2018-03-20 NOTE — Patient Outreach (Signed)
[facility] [facility]) Care Management  03/20/2018  [name] 12-25-1940 [id number]   CSW spoke with patient to discuss referral received from Dr. Juel Burrow office regarding community resources & advance directives. Patient states that she is unsure of her calendar and requested that CSW call patient's son, [name] to schedule visit. CSW left voicemail for [name] and will await returned call.    Raynaldo Opitz, LCSW [facility]  Clinical Social Worker cell #: [phone]

## 2018-03-22 ENCOUNTER — Ambulatory Visit: Payer: PPO | Admitting: *Deleted

## 2018-03-22 ENCOUNTER — Other Ambulatory Visit: Payer: Self-pay | Admitting: *Deleted

## 2018-03-22 NOTE — Patient Outreach (Signed)
[place] [facility]) Care Management  03/22/2018  SAMARA STANKOWSKI 07/10/1941 [id number]   CSW called & spoke with patient's son, [name] (ph#: [phone]) to discuss referral & resources available. Patient's son states that he is her 34 and though she does not have a living will, he is aware of her wishes. Patient's son reports that he lives with her and feels like he has a good handle on her care as of now and declined any CSW needs at this time. CSW encouraged patient & son to alert Dr. Nevada Crane if any further needs arise or call the main [facility] number. CSW signing off.    Raynaldo Opitz, LCSW [facility]  Clinical Social Worker cell #: [phone]

## 2018-04-01 NOTE — Telephone Encounter (Signed)
Spoke with the patients daughter and gave diagnosis code- this was all that needed to be done

## 2018-04-02 DIAGNOSIS — R296 Repeated falls: Secondary | ICD-10-CM | POA: Diagnosis not present

## 2018-04-02 DIAGNOSIS — H9190 Unspecified hearing loss, unspecified ear: Secondary | ICD-10-CM | POA: Diagnosis not present

## 2018-04-02 DIAGNOSIS — R42 Dizziness and giddiness: Secondary | ICD-10-CM | POA: Diagnosis not present

## 2018-04-02 DIAGNOSIS — R2689 Other abnormalities of gait and mobility: Secondary | ICD-10-CM | POA: Diagnosis not present

## 2018-04-02 DIAGNOSIS — F482 Pseudobulbar affect: Secondary | ICD-10-CM | POA: Diagnosis not present

## 2018-04-02 DIAGNOSIS — F0390 Unspecified dementia without behavioral disturbance: Secondary | ICD-10-CM | POA: Diagnosis not present

## 2018-04-02 DIAGNOSIS — Z6824 Body mass index (BMI) 24.0-24.9, adult: Secondary | ICD-10-CM | POA: Diagnosis not present

## 2018-04-02 DIAGNOSIS — M545 Low back pain: Secondary | ICD-10-CM | POA: Diagnosis not present

## 2018-04-02 DIAGNOSIS — F39 Unspecified mood [affective] disorder: Secondary | ICD-10-CM | POA: Diagnosis not present

## 2018-04-02 DIAGNOSIS — M069 Rheumatoid arthritis, unspecified: Secondary | ICD-10-CM | POA: Diagnosis not present

## 2018-04-02 DIAGNOSIS — G3184 Mild cognitive impairment, so stated: Secondary | ICD-10-CM | POA: Diagnosis not present

## 2018-04-23 DIAGNOSIS — E039 Hypothyroidism, unspecified: Secondary | ICD-10-CM | POA: Diagnosis not present

## 2018-04-23 DIAGNOSIS — I10 Essential (primary) hypertension: Secondary | ICD-10-CM | POA: Diagnosis not present

## 2018-04-23 DIAGNOSIS — E782 Mixed hyperlipidemia: Secondary | ICD-10-CM | POA: Diagnosis not present

## 2018-04-23 DIAGNOSIS — F411 Generalized anxiety disorder: Secondary | ICD-10-CM | POA: Diagnosis not present

## 2018-05-02 ENCOUNTER — Ambulatory Visit (INDEPENDENT_AMBULATORY_CARE_PROVIDER_SITE_OTHER): Payer: PPO | Admitting: Internal Medicine

## 2018-05-02 ENCOUNTER — Encounter (INDEPENDENT_AMBULATORY_CARE_PROVIDER_SITE_OTHER): Payer: Self-pay | Admitting: Internal Medicine

## 2018-05-02 VITALS — BP 164/80 | HR 80 | Temp 97.7°F | Ht 60.0 in | Wt 126.7 lb

## 2018-05-02 DIAGNOSIS — K921 Melena: Secondary | ICD-10-CM

## 2018-05-02 DIAGNOSIS — K920 Hematemesis: Secondary | ICD-10-CM | POA: Diagnosis not present

## 2018-05-02 DIAGNOSIS — R1310 Dysphagia, unspecified: Secondary | ICD-10-CM

## 2018-05-02 DIAGNOSIS — R1319 Other dysphagia: Secondary | ICD-10-CM

## 2018-05-02 LAB — HEMOGLOBIN AND HEMATOCRIT, BLOOD
HCT: 36.3 % (ref 35.0–45.0)
HEMOGLOBIN: 12.3 g/dL (ref 11.7–15.5)

## 2018-05-02 MED ORDER — PANTOPRAZOLE SODIUM 40 MG PO TBEC: 40.0000 mg | DELAYED_RELEASE_TABLET | Freq: Every day | ORAL | 3 refills | Status: DC

## 2018-05-02 NOTE — Patient Instructions (Addendum)
Stop the Omeprazole. Start the Protonix 40mg  daily . H and H today

## 2018-05-02 NOTE — Progress Notes (Signed)
Subjective:    Patient ID: [name], female    DOB: [birth year], 77 y.o.   MRN: [id number]  HPI Referred by Dr. Nevada Crane for vomiting dark brown vomitus. She tells me she is having trouble swallowing. ?She says it is really not that back.  She also vomited black vomitus several months ago. She says she can eat anything she wants.  Sometimes meats are slow to go down. Taking Omeprazole for her GERD. Her BMs are brown in color.  She does tell me several months ago her BM was black x 1. Her appetite is okay. No weight loss.  No NSAIDS  Hx of EGD/ED years ago for dysphagia.  EGD in August of 2003 for Dysphagia.  FINAL DIAGNOSES:  1. Erosive reflux esophagitis with soft stricture at esophagogastric     junction which was dilated by passing 56 [place] dilator.  2. Erosive gastritis with duodenitis along with 2 small antral ulcers.     These are most likely secondary to nonsteroidal anti-inflammatory drug     therapy.  Hx of  RA Review of Systems Past Medical History:  Diagnosis Date  . Anxiety   . Arm fracture, left   . Breast cancer ([place])   . Bulging disc   . Depression   . Difficulty swallowing solids   . GERD (gastroesophageal reflux disease)   . Hypertension   . Hypothyroidism   . Invasive ductal carcinoma of left breast ([place]) 02/24/2014  . Neuropathy   . Osteoporosis   . Rheumatoid arteritis    RA  . Rotator cuff disorder, right   . Scoliosis   . Shortness of breath dyspnea    "I have reactive airway disease".    Past Surgical History:  Procedure Laterality Date  . ABDOMINAL HYSTERECTOMY    . Barbie Banner OSTEOTOMY Right 10/28/2014   Procedure: Barbie Banner OSTEOTOMY RIGHT FOOT;  Surgeon: Marcheta Grammes, DPM;  Location: [facility];  Service: Podiatry;  Laterality: Right;  . BONE BIOPSY  09/29/2015   Procedure: BONE BIOPSY AND BONE CULTURE SECOND TOE RIGHT FOOT;  Surgeon: Caprice Beaver, DPM;  Location: [facility];  Service: Podiatry;;  . BREAST SURGERY Left    lumpectomy and axillary lymph node with re-excision 2 weeks later   . BUNIONECTOMY Right 10/28/2014   Procedure: SILVER BUNIONECTOMY RIGHT FOOT;  Surgeon: Marcheta Grammes, DPM;  Location: [facility];  Service: Podiatry;  Laterality: Right;  . CATARACT EXTRACTION W/PHACO Left 08/10/2017   Procedure: CATARACT EXTRACTION PHACO AND INTRAOCULAR LENS PLACEMENT LEFT EYE;  Surgeon: Baruch Goldmann, MD;  Location: [facility];  Service: Ophthalmology;  Laterality: Left;  CDE: 13.81  . CATARACT EXTRACTION W/PHACO Right 09/07/2017   Procedure: CATARACT EXTRACTION PHACO AND INTRAOCULAR LENS PLACEMENT RIGHT EYE;  Surgeon: Baruch Goldmann, MD;  Location: [facility];  Service: Ophthalmology;  Laterality: Right;  right  . COLONOSCOPY    . DILATION AND CURETTAGE OF UTERUS    . esophageal stricture    . HAMMER TOE SURGERY Right 10/28/2014   Procedure: HAMMER TOE CORRECTION 2ND AND 3RD TOE RIGHT FOOT;  Surgeon: Marcheta Grammes, DPM;  Location: [facility];  Service: Podiatry;  Laterality: Right;  . LUMBAR LAMINECTOMY/DECOMPRESSION MICRODISCECTOMY Left 03/13/2016   Procedure: Left Lumbar Four-Five, Lumbar Five-Sacral One Laminotomy/Foraminotomy;  Surgeon: Kevan Ny Ditty, MD;  [facility];  Service: Neurosurgery;  Laterality: Left;  . REMOVAL OF IMPLANT Right 09/29/2015   Procedure: REMOVAL OF IMPLANT 2ND TOE RIGHT FOOT;  Surgeon: Caprice Beaver, DPM;  Location: [facility];  Service: Podiatry;  Laterality: Right;    Allergies  Allergen Reactions  . Celecoxib Swelling and Other (See Comments)    NO SPECIFIED AREA  . Ibuprofen Swelling and Other (See Comments)    NO SPECIFIED AREA  . Diphenhydramine Rash and Other (See Comments)    Wide awake.   Micheline Maze [Propoxyphene N-Acetaminophen] Nausea And Vomiting    Current Outpatient Medications on File Prior to Visit  Medication Sig Dispense Refill  . albuterol (PROAIR HFA) 108 (90 Base) MCG/ACT inhaler Inhale 2 puffs into the lungs every 6 (six) hours as  needed for wheezing or shortness of breath.     . Calcium Carbonate-Vitamin D (CALTRATE 600+D) 600-400 MG-UNIT tablet Take 1 tablet by mouth 2 (two) times daily.    . cetirizine (ZYRTEC) 10 MG tablet Take 10 mg by mouth daily as needed for allergies.     . Cholecalciferol (VITAMIN D) 2000 UNITS CAPS Take 2,000 Units by mouth daily.     Marland Kitchen denosumab (PROLIA) 60 MG/ML SOLN injection Inject 60 mg into the skin every 6 (six) months. Administer in upper arm, thigh, or abdomen, got in June 2019    . DULoxetine (CYMBALTA) 60 MG capsule Take 60 mg by mouth daily.    . folic acid (FOLVITE) 878 MCG tablet Take 800 mcg by mouth daily.    Marland Kitchen lisinopril (PRINIVIL,ZESTRIL) 10 MG tablet Take 5 mg by mouth daily. Son says she takes it as needed when she feels her BP is high, she will check it and then take it if needed.    . Magnesium 500 MG CAPS Take 500 mg by mouth daily.     . memantine (NAMENDA) 10 MG tablet Take 10 mg by mouth 2 (two) times daily.    . methotrexate (RHEUMATREX) 2.5 MG tablet Take 22.5 mg by mouth once weekly on Tuesday    . omeprazole (PRILOSEC) 20 MG capsule Take 20 mg by mouth daily.    . potassium chloride (K-DUR) 10 MEQ tablet Take 10 mEq by mouth at bedtime.    . senna (EQ NATURAL VEGETABLE LAXATIVE) 8.6 MG tablet Take 1 tablet by mouth daily as needed for constipation.    . Zinc 50 MG CAPS Take 50 mg by mouth daily.      No current facility-administered medications on file prior to visit.         Objective:   Physical Exam  Vitals:   05/02/18 1139  Weight: 126 lb 11.2 oz (57.5 kg)  Height: 5' (1.524 m)   Alert and oriented. Skin warm and dry. Oral mucosa is moist.   . Sclera anicteric, conjunctivae is pink. Thyroid not enlarged. No cervical lymphadenopathy. Lungs clear. Heart regular rate and rhythm.  Abdomen is soft. Bowel sounds are positive. No hepatomegaly. No abdominal masses felt. No tenderness.  No edema to lower extremities.            Assessment & Plan:  Melena,  one episode. Hematemesis one episode. Dysphagia.  Hx of esophageal stricture. Ulcer.  Needs EGD/ED.

## 2018-05-07 ENCOUNTER — Telehealth (INDEPENDENT_AMBULATORY_CARE_PROVIDER_SITE_OTHER): Payer: Self-pay | Admitting: Internal Medicine

## 2018-05-07 DIAGNOSIS — R601 Generalized edema: Secondary | ICD-10-CM | POA: Diagnosis not present

## 2018-05-07 DIAGNOSIS — Z79899 Other long term (current) drug therapy: Secondary | ICD-10-CM | POA: Diagnosis not present

## 2018-05-07 DIAGNOSIS — K921 Melena: Secondary | ICD-10-CM | POA: Insufficient documentation

## 2018-05-07 DIAGNOSIS — M0579 Rheumatoid arthritis with rheumatoid factor of multiple sites without organ or systems involvement: Secondary | ICD-10-CM | POA: Diagnosis not present

## 2018-05-07 DIAGNOSIS — M8000XS Age-related osteoporosis with current pathological fracture, unspecified site, sequela: Secondary | ICD-10-CM | POA: Diagnosis not present

## 2018-05-07 DIAGNOSIS — R1310 Dysphagia, unspecified: Secondary | ICD-10-CM | POA: Insufficient documentation

## 2018-05-07 DIAGNOSIS — R1319 Other dysphagia: Secondary | ICD-10-CM | POA: Insufficient documentation

## 2018-05-07 DIAGNOSIS — M21372 Foot drop, left foot: Secondary | ICD-10-CM | POA: Diagnosis not present

## 2018-05-07 DIAGNOSIS — K920 Hematemesis: Secondary | ICD-10-CM | POA: Insufficient documentation

## 2018-05-07 NOTE — Telephone Encounter (Signed)
Results given to patient. Will be scheduled for an EGD

## 2018-05-08 ENCOUNTER — Encounter (INDEPENDENT_AMBULATORY_CARE_PROVIDER_SITE_OTHER): Payer: Self-pay | Admitting: *Deleted

## 2018-05-15 ENCOUNTER — Ambulatory Visit (HOSPITAL_COMMUNITY)
Admission: RE | Admit: 2018-05-15 | Discharge: 2018-05-15 | Disposition: A | Discharge status: Home / Self Care | Payer: PPO | Source: Ambulatory Visit | Attending: Internal Medicine | Admitting: Internal Medicine

## 2018-05-15 ENCOUNTER — Encounter (HOSPITAL_COMMUNITY)
Admission: RE | Disposition: A | Discharge status: Home / Self Care | Payer: Self-pay | Source: Ambulatory Visit | Attending: Internal Medicine

## 2018-05-15 ENCOUNTER — Encounter (HOSPITAL_COMMUNITY): Payer: Self-pay | Admitting: *Deleted

## 2018-05-15 ENCOUNTER — Other Ambulatory Visit: Payer: Self-pay

## 2018-05-15 DIAGNOSIS — I10 Essential (primary) hypertension: Secondary | ICD-10-CM | POA: Diagnosis not present

## 2018-05-15 DIAGNOSIS — Z79899 Other long term (current) drug therapy: Secondary | ICD-10-CM | POA: Diagnosis not present

## 2018-05-15 DIAGNOSIS — Z886 Allergy status to analgesic agent status: Secondary | ICD-10-CM | POA: Insufficient documentation

## 2018-05-15 DIAGNOSIS — R1319 Other dysphagia: Secondary | ICD-10-CM | POA: Insufficient documentation

## 2018-05-15 DIAGNOSIS — F329 Major depressive disorder, single episode, unspecified: Secondary | ICD-10-CM | POA: Diagnosis not present

## 2018-05-15 DIAGNOSIS — M419 Scoliosis, unspecified: Secondary | ICD-10-CM | POA: Diagnosis not present

## 2018-05-15 DIAGNOSIS — R1310 Dysphagia, unspecified: Secondary | ICD-10-CM | POA: Insufficient documentation

## 2018-05-15 DIAGNOSIS — K449 Diaphragmatic hernia without obstruction or gangrene: Secondary | ICD-10-CM | POA: Insufficient documentation

## 2018-05-15 DIAGNOSIS — K921 Melena: Secondary | ICD-10-CM | POA: Insufficient documentation

## 2018-05-15 DIAGNOSIS — M810 Age-related osteoporosis without current pathological fracture: Secondary | ICD-10-CM | POA: Diagnosis not present

## 2018-05-15 DIAGNOSIS — M069 Rheumatoid arthritis, unspecified: Secondary | ICD-10-CM | POA: Insufficient documentation

## 2018-05-15 DIAGNOSIS — G629 Polyneuropathy, unspecified: Secondary | ICD-10-CM | POA: Diagnosis not present

## 2018-05-15 DIAGNOSIS — K3189 Other diseases of stomach and duodenum: Secondary | ICD-10-CM | POA: Insufficient documentation

## 2018-05-15 DIAGNOSIS — K219 Gastro-esophageal reflux disease without esophagitis: Secondary | ICD-10-CM | POA: Insufficient documentation

## 2018-05-15 DIAGNOSIS — K2970 Gastritis, unspecified, without bleeding: Secondary | ICD-10-CM | POA: Diagnosis not present

## 2018-05-15 DIAGNOSIS — Z853 Personal history of malignant neoplasm of breast: Secondary | ICD-10-CM | POA: Diagnosis not present

## 2018-05-15 DIAGNOSIS — K920 Hematemesis: Secondary | ICD-10-CM | POA: Insufficient documentation

## 2018-05-15 DIAGNOSIS — K222 Esophageal obstruction: Secondary | ICD-10-CM | POA: Insufficient documentation

## 2018-05-15 DIAGNOSIS — R1314 Dysphagia, pharyngoesophageal phase: Secondary | ICD-10-CM | POA: Diagnosis not present

## 2018-05-15 DIAGNOSIS — F419 Anxiety disorder, unspecified: Secondary | ICD-10-CM | POA: Diagnosis not present

## 2018-05-15 HISTORY — PX: ESOPHAGOGASTRODUODENOSCOPY: SHX5428

## 2018-05-15 HISTORY — PX: BIOPSY: SHX5522

## 2018-05-15 HISTORY — PX: ESOPHAGEAL DILATION: SHX303

## 2018-05-15 SURGERY — EGD (ESOPHAGOGASTRODUODENOSCOPY)
Anesthesia: Moderate Sedation

## 2018-05-15 MED ORDER — MIDAZOLAM HCL 5 MG/5ML IJ SOLN
INTRAMUSCULAR | Status: DC | PRN
  Administered 2018-05-15 (×2): 1 mg via INTRAVENOUS

## 2018-05-15 MED ORDER — SODIUM CHLORIDE 0.9 % IV SOLN
INTRAVENOUS | Status: DC
  Administered 2018-05-15: 07:00:00 via INTRAVENOUS

## 2018-05-15 MED ORDER — STERILE WATER FOR IRRIGATION IR SOLN
Status: DC | PRN
  Administered 2018-05-15: 08:00:00

## 2018-05-15 MED ORDER — MEPERIDINE HCL 50 MG/ML IJ SOLN
INTRAMUSCULAR | Status: AC
  Filled 2018-05-15: qty 1

## 2018-05-15 MED ORDER — LIDOCAINE VISCOUS HCL 2 % MT SOLN
OROMUCOSAL | Status: AC
  Filled 2018-05-15: qty 15

## 2018-05-15 MED ORDER — LIDOCAINE VISCOUS HCL 2 % MT SOLN
OROMUCOSAL | Status: DC | PRN
  Administered 2018-05-15: 4 mL via OROMUCOSAL

## 2018-05-15 MED ORDER — MIDAZOLAM HCL 5 MG/5ML IJ SOLN
INTRAMUSCULAR | Status: AC
  Filled 2018-05-15: qty 10

## 2018-05-15 MED ORDER — MEPERIDINE HCL 50 MG/ML IJ SOLN
INTRAMUSCULAR | Status: DC | PRN
  Administered 2018-05-15: 25 mg via INTRAVENOUS

## 2018-05-15 NOTE — H&P (Signed)
[name] is an 77 y.o. female.   Chief Complaint: Patient is here for EGD and ED. HPI: Patient is 77 year old Caucasian female who has history of erosive esophagitis and distal esophageal stricture who presents with few months history of dysphagia to solids.  She points to upper sternal area as site of bolus obstruction.  Food eventually goes down.  She has not had an episode regurgitation.  She feels heartburn is well controlled with therapy.  She states she has lost few pounds this year but she is not sure why.  Her daughter states she vomited coffee-ground material few weeks ago.  She showed me a picture that she had saved on her cell phone. Patient denies epigastric pain or melena.  She also denies rectal bleeding. She does not take aspirin or other OTC NSAIDs.  She is on methotrexate for rheumatoid arthritis along with many other medications including pantoprazole. Last EGD with ED was 16 years ago.  Past Medical History:  Diagnosis Date  . Anxiety   . Arm fracture, left   . Breast cancer ([place])   . Bulging disc   . Depression   .    Marland Kitchen GERD (gastroesophageal reflux disease)   . Hypertension   . Hypothyroidism   . Invasive ductal carcinoma of left breast ([place]) 02/24/2014  . Neuropathy   . Osteoporosis   . Rheumatoid arteritis    RA  . Rotator cuff disorder, right   . Scoliosis   . Shortness of breath dyspnea    "I have reactive airway disease".    Past Surgical History:  Procedure Laterality Date  . ABDOMINAL HYSTERECTOMY    . Barbie Banner OSTEOTOMY Right 10/28/2014   Procedure: Barbie Banner OSTEOTOMY RIGHT FOOT;  Surgeon: Marcheta Grammes, DPM;  [facility]: [facility];  Service: Podiatry;  Laterality: Right;  . BONE BIOPSY  09/29/2015   Procedure: BONE BIOPSY AND BONE CULTURE SECOND TOE RIGHT FOOT;  Surgeon: Caprice Beaver, DPM;  [facility]: [facility];  Service: Podiatry;;  . BREAST SURGERY Left    lumpectomy and axillary lymph node with re-excision 2 weeks later   . BUNIONECTOMY Right  10/28/2014   Procedure: SILVER BUNIONECTOMY RIGHT FOOT;  Surgeon: Marcheta Grammes, DPM;  [facility]: [facility];  Service: Podiatry;  Laterality: Right;  . CATARACT EXTRACTION W/PHACO Left 08/10/2017   Procedure: CATARACT EXTRACTION PHACO AND INTRAOCULAR LENS PLACEMENT LEFT EYE;  Surgeon: Baruch Goldmann, MD;  Location: [facility];  Service: Ophthalmology;  Laterality: Left;  CDE: 13.81  . CATARACT EXTRACTION W/PHACO Right 09/07/2017   Procedure: CATARACT EXTRACTION PHACO AND INTRAOCULAR LENS PLACEMENT RIGHT EYE;  Surgeon: Baruch Goldmann, MD;  [facility];  Service: Ophthalmology;  Laterality: Right;  right  . COLONOSCOPY    . DILATION AND CURETTAGE OF UTERUS    . esophageal stricture    . HAMMER TOE SURGERY Right 10/28/2014   Procedure: HAMMER TOE CORRECTION 2ND AND 3RD TOE RIGHT FOOT;  Surgeon: Marcheta Grammes, DPM;  [facility]: [facility];  Service: Podiatry;  Laterality: Right;  . LUMBAR LAMINECTOMY/DECOMPRESSION MICRODISCECTOMY Left 03/13/2016   Procedure: Left Lumbar Four-Five, Lumbar Five-Sacral One Laminotomy/Foraminotomy;  Surgeon: Kevan Ny Ditty, MD;  [facility];  Service: Neurosurgery;  Laterality: Left;  . REMOVAL OF IMPLANT Right 09/29/2015   Procedure: REMOVAL OF IMPLANT 2ND TOE RIGHT FOOT;  Surgeon: Caprice Beaver, DPM;  Location: [facility];  Service: Podiatry;  Laterality: Right;    Family History  Problem Relation Age of Onset  . Hypertension Mother   .  Cancer Maternal Grandmother   . Cancer Cousin    Social History:  reports that she has never smoked. She has never used smokeless tobacco. She reports that she does not drink alcohol or use drugs.  Allergies:  Allergies  Allergen Reactions  . Celecoxib Swelling and Other (See Comments)  . Ibuprofen Swelling and Other (See Comments)  . Diphenhydramine Rash and Other (See Comments)    Wide awake.   Micheline Maze [Propoxyphene N-Acetaminophen] Nausea And Vomiting    Medications Prior to Admission  Medication  Sig Dispense Refill  . Calcium Carbonate-Vitamin D (CALTRATE 600+D) 600-400 MG-UNIT tablet Take 1 tablet by mouth 2 (two) times daily.    . Cholecalciferol (VITAMIN D) 2000 UNITS CAPS Take 2,000 Units by mouth daily.     Marland Kitchen donepezil (ARICEPT) 5 MG tablet Take 5 mg by mouth at bedtime.    . DULoxetine (CYMBALTA) 60 MG capsule Take 60 mg by mouth daily.    . folic acid (FOLVITE) 427 MCG tablet Take 800 mcg by mouth daily.    Marland Kitchen HYDROcodone-acetaminophen (NORCO/VICODIN) 5-325 MG tablet Take 1 tablet by mouth daily as needed for pain.    . Magnesium 500 MG CAPS Take 500 mg by mouth daily.     . memantine (NAMENDA) 10 MG tablet Take 10 mg by mouth 2 (two) times daily.    . methotrexate (RHEUMATREX) 2.5 MG tablet Take 22.5 mg by mouth once a week. Every Tuesday    . OVER THE COUNTER MEDICATION Take 8 oz by mouth daily as needed (leg pain). Tonic Water with Quinine    . pantoprazole (PROTONIX) 40 MG tablet Take 1 tablet (40 mg total) by mouth daily. 90 tablet 3  . potassium chloride (K-DUR) 10 MEQ tablet Take 10 mEq by mouth at bedtime.    . senna (EQ NATURAL VEGETABLE LAXATIVE) 8.6 MG tablet Take 1 tablet by mouth daily as needed for constipation.    . Zinc 50 MG CAPS Take 50 mg by mouth daily.     Marland Kitchen albuterol (PROAIR HFA) 108 (90 Base) MCG/ACT inhaler Inhale 2 puffs into the lungs every 6 (six) hours as needed for wheezing or shortness of breath.     . cetirizine (ZYRTEC) 10 MG tablet Take 10 mg by mouth daily as needed for allergies.     Marland Kitchen denosumab (PROLIA) 60 MG/ML SOLN injection Inject 60 mg into the skin every 6 (six) months. Administer in upper arm, thigh, or abdomen, got in June 2019    . lisinopril (PRINIVIL,ZESTRIL) 10 MG tablet Take 5 mg by mouth daily. Son says she takes it as needed when she feels her BP is high, she will check it and then take it if needed.      No results found for this or any previous visit (from the past 48 hour(s)). No results found.  ROS  Blood pressure (!)  160/73, pulse 74, temperature 97.8 F (36.6 C), temperature source Oral, resp. rate (!) 28, height 5' (1.524 m), weight 57.5 kg, SpO2 100 %. Physical Exam  Constitutional:  Well-developed thin Caucasian female in NAD.  HENT:  Mouth/Throat: Oropharynx is clear and moist.  Dentition in satisfactory condition.  Eyes: Conjunctivae are normal. No scleral icterus.  Neck: No thyromegaly present.  Cardiovascular: Normal rate, regular rhythm and normal heart sounds.  No murmur heard. Respiratory: Effort normal and breath sounds normal.  GI: Soft. She exhibits no distension and no mass. There is no tenderness.  Musculoskeletal: She exhibits edema.  1+ edema  to both ankles.  Lymphadenopathy:    She has no cervical adenopathy.  Neurological: She is alert.  Skin: Skin is warm and dry.     Assessment/Plan Solid food dysphagia and patient with history of GERD and esophageal stricture. History of coffee-ground emesis. EGD with ED.  Hildred Laser, MD 05/15/2018, 7:32 AM

## 2018-05-15 NOTE — Op Note (Signed)
[facility] Patient Name: [name] Procedure Date: 05/15/2018 7:42 AM MRN: [id number] Date of Birth: [birth year] Attending MD: Hildred Laser , MD CSN: [id number] Age: 77 Admit Type: Outpatient Procedure:                Upper GI endoscopy Indications:              Esophageal dysphagia, Coffee-ground emesis Providers:                Hildred Laser, MD, Hinton Rao, RN, Jeanann Lewandowsky.                            Sharon Seller, RN Referring MD:             Delphina Cahill, MD Medicines:                Lidocaine spray, Meperidine 25 mg IV, Midazolam 2                            mg IV Complications:            No immediate complications. Estimated Blood Loss:     Estimated blood loss was minimal. Procedure:                Pre-Anesthesia Assessment:                           - Prior to the procedure, a History and Physical                            was performed, and patient medications and                            allergies were reviewed. The patient's tolerance of                            previous anesthesia was also reviewed. The risks                            and benefits of the procedure and the sedation                            options and risks were discussed with the patient.                            All questions were answered, and informed consent                            was obtained. Prior Anticoagulants: The patient has                            taken no previous anticoagulant or antiplatelet                            agents. ASA Grade Assessment: III - A patient with  severe systemic disease. After reviewing the risks                            and benefits, the patient was deemed in                            satisfactory condition to undergo the procedure.                           After obtaining informed consent, the endoscope was                            passed under direct vision. Throughout the                            procedure,  the patient's blood pressure, pulse, and                            oxygen saturations were monitored continuously. The                            GIF-H190 ([id number]) scope was introduced through the                            mouth, and advanced to the second part of duodenum.                            The upper GI endoscopy was accomplished without                            difficulty. The patient tolerated the procedure                            well. Scope In: 7:55:15 AM Scope Out: 8:10:48 AM Total Procedure Duration: 0 hours 15 minutes 33 seconds  Findings:      One benign-appearing, intrinsic mild stenosis was found 28 cm from the       incisors. This stenosis measured 1.3 cm (inner diameter) x less than one       cm (in length). The stenosis was traversed. The scope was withdrawn.       Dilation was attempted, but not completed because or tortuos esophagus.       A TTS dilator was passed through the scope. Dilation with a 15-16.5-18       mm balloon dilator was performed to 15 mm, 16.5 mm and 18 mm. The       dilation site was examined and showed mild mucosal disruption.      A 7 cm hiatal hernia was present.      Patchy mild inflammation characterized by congestion (edema) and       erythema was found in the gastric body and in the gastric antrum.       Biopsies were taken with a cold forceps for histology.      The exam of the stomach was otherwise normal.      The duodenal bulb and second portion of the duodenum were normal. Impression:               -  Benign-appearing esophageal stenosis. Unable to                            dilate with [facility] dilator. Therefore balloon                            dilator used.                           - 7 cm hiatal hernia.                           - Gastritis. Biopsied.                           - Normal duodenal bulb and second portion of the                            duodenum. Moderate Sedation:      Moderate (conscious) sedation  was administered by the endoscopy nurse       and supervised by the endoscopist. The following parameters were       monitored: oxygen saturation, heart rate, blood pressure, CO2       capnography and response to care. Total physician intraservice time was       23 minutes. Recommendation:           - Patient has a contact number available for                            emergencies. The signs and symptoms of potential                            delayed complications were discussed with the                            patient. Return to normal activities tomorrow.                            Written discharge instructions were provided to the                            patient.                           - Resume previous diet today.                           - Continue present medications.                           - No aspirin, ibuprofen, naproxen, or other                            non-steroidal anti-inflammatory drugs for 3 days.                           - Await pathology results.                           -  Repeat upper endoscopy PRN. Procedure Code(s):        --- Professional ---                           [phone], Esophagogastroduodenoscopy, flexible,                            transoral; with transendoscopic balloon dilation of                            esophagus (less than 30 mm diameter)                           43239, Esophagogastroduodenoscopy, flexible,                            transoral; with biopsy, single or multiple                           G0500, Moderate sedation services provided by the                            same physician or other qualified health care                            professional performing a gastrointestinal                            endoscopic service that sedation supports,                            requiring the presence of an independent trained                            observer to assist in the monitoring of the                             patient's level of consciousness and physiological                            status; initial 15 minutes of intra-service time;                            patient age 69 years or older (additional time may                            be reported with [phone], as appropriate)                           [phone], Moderate sedation services provided by the                            same physician or other qualified health care  professional performing the diagnostic or                            therapeutic service that the sedation supports,                            requiring the presence of an independent trained                            observer to assist in the monitoring of the                            patient's level of consciousness and physiological                            status; each additional 15 minutes intraservice                            time (List separately in addition to code for                            primary service) Diagnosis Code(s):        --- Professional ---                           K22.2, Esophageal obstruction                           K44.9, Diaphragmatic hernia without obstruction or                            gangrene                           K29.70, Gastritis, unspecified, without bleeding                           R13.14, Dysphagia, pharyngoesophageal phase                           K92.0, Hematemesis CPT copyright 2017 [employer]. All rights reserved. The codes documented in this report are preliminary and upon coder review may  be revised to meet current compliance requirements. Hildred Laser, MD Hildred Laser, MD 05/15/2018 8:24:29 AM This report has been signed electronically. Number of Addenda: 0

## 2018-05-15 NOTE — Discharge Instructions (Signed)
Esophageal Dilatation Esophageal dilatation is a procedure to open a blocked or narrowed part of the esophagus. The esophagus is the long tube in your throat that carries food and liquid from your mouth to your stomach. The procedure is also called esophageal dilation. You may need this procedure if you have a buildup of scar tissue in your esophagus that makes it difficult, painful, or even impossible to swallow. This can be caused by gastroesophageal reflux disease (GERD). In rare cases, people need this procedure because they have cancer of the esophagus or a problem with the way food moves through the esophagus. Sometimes you may need to have another dilatation to enlarge the opening of the esophagus gradually. Tell a health care provider about:  Any allergies you have.  All medicines you are taking, including vitamins, herbs, eye drops, creams, and over-the-counter medicines.  Any problems you or family members have had with anesthetic medicines.  Any blood disorders you have.  Any surgeries you have had.  Any medical conditions you have.  Any antibiotic medicines you are required to take before dental procedures. What are the risks? Generally, this is a safe procedure. However, problems can occur and include:  Bleeding from a tear in the lining of the esophagus.  A hole (perforation) in the esophagus.  What happens before the procedure?  Do not eat or drink anything after midnight on the night before the procedure or as directed by your health care provider.  Ask your health care provider about changing or stopping your regular medicines. This is especially important if you are taking diabetes medicines or blood thinners.  Plan to have someone take you home after the procedure. What happens during the procedure?  You will be given a medicine that makes you relaxed and sleepy (sedative).  A medicine may be sprayed or gargled to numb the back of the throat.  Your health care  provider can use various instruments to do an esophageal dilatation. During the procedure, the instrument used will be placed in your mouth and passed down into your esophagus. Options include: ? Simple dilators. This instrument is carefully placed in the esophagus to stretch it. ? Guided wire bougies. In this method, a flexible tube (endoscope) is used to insert a wire into the esophagus. The dilator is passed over this wire to enlarge the esophagus. Then the wire is removed. ? Balloon dilators. An endoscope with a small balloon at the end is passed down into the esophagus. Inflating the balloon gently stretches the esophagus and opens it up. What happens after the procedure?  Your blood pressure, heart rate, breathing rate, and blood oxygen level will be monitored often until the medicines you were given have worn off.  Your throat may feel slightly sore and will probably still feel numb. This will improve slowly over time.  You will not be allowed to eat or drink until the throat numbness has resolved.  If this is a same-day procedure, you may be allowed to go home once you have been able to drink, urinate, and sit on the edge of the bed without nausea or dizziness.  If this is a same-day procedure, you should have a friend or family member with you for the next 24 hours after the procedure. This information is not intended to replace advice given to you by your health care provider. Make sure you discuss any questions you have with your health care provider. Document Released: 10/12/2005 Document Revised: 01/27/2016 Document Reviewed: 12/31/2013 Elsevier Interactive  Patient Education  [employer]. Esophagogastroduodenoscopy Esophagogastroduodenoscopy (EGD) is a procedure to examine the lining of the esophagus, stomach, and first part of the small intestine (duodenum). This procedure is done to check for problems such as inflammation, bleeding, ulcers, or growths. During this procedure,  a long, flexible, lighted tube with a camera attached (endoscope) is inserted down the throat. Tell a health care provider about:  Any allergies you have.  All medicines you are taking, including vitamins, herbs, eye drops, creams, and over-the-counter medicines.  Any problems you or family members have had with anesthetic medicines.  Any blood disorders you have.  Any surgeries you have had.  Any medical conditions you have.  Whether you are pregnant or may be pregnant. What are the risks? Generally, this is a safe procedure. However, problems may occur, including:  Infection.  Bleeding.  A tear (perforation) in the esophagus, stomach, or duodenum.  Trouble breathing.  Excessive sweating.  Spasms of the larynx.  A slowed heartbeat.  Low blood pressure.  What happens before the procedure?  Follow instructions from your health care provider about eating or drinking restrictions.  Ask your health care provider about: ? Changing or stopping your regular medicines. This is especially important if you are taking diabetes medicines or blood thinners. ? Taking medicines such as aspirin and ibuprofen. These medicines can thin your blood. Do not take these medicines before your procedure if your health care provider instructs you not to.  Plan to have someone take you home after the procedure.  If you wear dentures, be ready to remove them before the procedure. What happens during the procedure?  To reduce your risk of infection, your health care team will wash or sanitize their hands.  An IV tube will be put in a vein in your hand or arm. You will get medicines and fluids through this tube.  You will be given one or more of the following: ? A medicine to help you relax (sedative). ? A medicine to numb the area (local anesthetic). This medicine may be sprayed into your throat. It will make you feel more comfortable and keep you from gagging or coughing during the  procedure. ? A medicine for pain.  A mouth guard may be placed in your mouth to protect your teeth and to keep you from biting on the endoscope.  You will be asked to lie on your left side.  The endoscope will be lowered down your throat into your esophagus, stomach, and duodenum.  Air will be put into the endoscope. This will help your health care provider see better.  The lining of your esophagus, stomach, and duodenum will be examined.  Your health care provider may: ? Take a tissue sample so it can be looked at in a lab (biopsy). ? Remove growths. ? Remove objects (foreign bodies) that are stuck. ? Treat any bleeding with medicines or other devices that stop tissue from bleeding. ? Widen (dilate) or stretch narrowed areas of your esophagus and stomach.  The endoscope will be taken out. The procedure may vary among health care providers and hospitals. What happens after the procedure?  Your blood pressure, heart rate, breathing rate, and blood oxygen level will be monitored often until the medicines you were given have worn off.  Do not eat or drink anything until the numbing medicine has worn off and your gag reflex has returned. This information is not intended to replace advice given to you by your health care provider.  Make sure you discuss any questions you have with your health care provider. Document Released: 12/22/2004 Document Revised: 01/27/2016 Document Reviewed: 07/15/2015 Elsevier Interactive Patient Education  2018 [employer]. No aspirin or NSAIDs for 3 days. Resume medications as before. Resume usual diet. No driving for 24 hours. Physician will call with biopsy results.

## 2018-05-21 ENCOUNTER — Encounter (HOSPITAL_COMMUNITY): Payer: Self-pay | Admitting: Internal Medicine

## 2018-07-25 DIAGNOSIS — I10 Essential (primary) hypertension: Secondary | ICD-10-CM | POA: Diagnosis not present

## 2018-07-25 DIAGNOSIS — E039 Hypothyroidism, unspecified: Secondary | ICD-10-CM | POA: Diagnosis not present

## 2018-07-25 DIAGNOSIS — M069 Rheumatoid arthritis, unspecified: Secondary | ICD-10-CM | POA: Diagnosis not present

## 2018-07-25 DIAGNOSIS — E0510 Thyrotoxicosis with toxic single thyroid nodule without thyrotoxic crisis or storm: Secondary | ICD-10-CM | POA: Diagnosis not present

## 2018-07-25 DIAGNOSIS — E782 Mixed hyperlipidemia: Secondary | ICD-10-CM | POA: Diagnosis not present

## 2018-08-01 ENCOUNTER — Emergency Department (HOSPITAL_COMMUNITY): Payer: PPO

## 2018-08-01 ENCOUNTER — Other Ambulatory Visit: Payer: Self-pay

## 2018-08-01 ENCOUNTER — Encounter (HOSPITAL_COMMUNITY): Payer: Self-pay | Admitting: Emergency Medicine

## 2018-08-01 ENCOUNTER — Inpatient Hospital Stay (HOSPITAL_COMMUNITY)
Admission: EM | Admit: 2018-08-01 | Discharge: 2018-08-06 | DRG: 065 | Disposition: A | Discharge status: Home Health | Payer: PPO | Attending: Internal Medicine | Admitting: Internal Medicine

## 2018-08-01 DIAGNOSIS — Z79899 Other long term (current) drug therapy: Secondary | ICD-10-CM

## 2018-08-01 DIAGNOSIS — G629 Polyneuropathy, unspecified: Secondary | ICD-10-CM | POA: Diagnosis present

## 2018-08-01 DIAGNOSIS — E871 Hypo-osmolality and hyponatremia: Secondary | ICD-10-CM | POA: Diagnosis present

## 2018-08-01 DIAGNOSIS — E039 Hypothyroidism, unspecified: Secondary | ICD-10-CM | POA: Diagnosis present

## 2018-08-01 DIAGNOSIS — E785 Hyperlipidemia, unspecified: Secondary | ICD-10-CM | POA: Diagnosis present

## 2018-08-01 DIAGNOSIS — I639 Cerebral infarction, unspecified: Secondary | ICD-10-CM | POA: Diagnosis present

## 2018-08-01 DIAGNOSIS — Z888 Allergy status to other drugs, medicaments and biological substances status: Secondary | ICD-10-CM | POA: Diagnosis not present

## 2018-08-01 DIAGNOSIS — F0390 Unspecified dementia without behavioral disturbance: Secondary | ICD-10-CM | POA: Diagnosis present

## 2018-08-01 DIAGNOSIS — R29700 NIHSS score 0: Secondary | ICD-10-CM | POA: Diagnosis present

## 2018-08-01 DIAGNOSIS — K529 Noninfective gastroenteritis and colitis, unspecified: Secondary | ICD-10-CM

## 2018-08-01 DIAGNOSIS — I6389 Other cerebral infarction: Secondary | ICD-10-CM | POA: Diagnosis not present

## 2018-08-01 DIAGNOSIS — R410 Disorientation, unspecified: Secondary | ICD-10-CM

## 2018-08-01 DIAGNOSIS — K449 Diaphragmatic hernia without obstruction or gangrene: Secondary | ICD-10-CM | POA: Diagnosis not present

## 2018-08-01 DIAGNOSIS — K5730 Diverticulosis of large intestine without perforation or abscess without bleeding: Secondary | ICD-10-CM | POA: Diagnosis not present

## 2018-08-01 DIAGNOSIS — Z7951 Long term (current) use of inhaled steroids: Secondary | ICD-10-CM | POA: Diagnosis not present

## 2018-08-01 DIAGNOSIS — I10 Essential (primary) hypertension: Secondary | ICD-10-CM | POA: Diagnosis present

## 2018-08-01 DIAGNOSIS — M810 Age-related osteoporosis without current pathological fracture: Secondary | ICD-10-CM | POA: Diagnosis present

## 2018-08-01 DIAGNOSIS — M069 Rheumatoid arthritis, unspecified: Secondary | ICD-10-CM | POA: Diagnosis present

## 2018-08-01 DIAGNOSIS — D649 Anemia, unspecified: Secondary | ICD-10-CM | POA: Diagnosis present

## 2018-08-01 DIAGNOSIS — K219 Gastro-esophageal reflux disease without esophagitis: Secondary | ICD-10-CM | POA: Diagnosis present

## 2018-08-01 DIAGNOSIS — Z886 Allergy status to analgesic agent status: Secondary | ICD-10-CM | POA: Diagnosis not present

## 2018-08-01 DIAGNOSIS — I63513 Cerebral infarction due to unspecified occlusion or stenosis of bilateral middle cerebral arteries: Secondary | ICD-10-CM | POA: Diagnosis not present

## 2018-08-01 DIAGNOSIS — K222 Esophageal obstruction: Secondary | ICD-10-CM | POA: Diagnosis present

## 2018-08-01 DIAGNOSIS — E876 Hypokalemia: Secondary | ICD-10-CM | POA: Diagnosis present

## 2018-08-01 DIAGNOSIS — Z853 Personal history of malignant neoplasm of breast: Secondary | ICD-10-CM

## 2018-08-01 DIAGNOSIS — D696 Thrombocytopenia, unspecified: Secondary | ICD-10-CM | POA: Diagnosis present

## 2018-08-01 DIAGNOSIS — G9349 Other encephalopathy: Secondary | ICD-10-CM | POA: Diagnosis present

## 2018-08-01 DIAGNOSIS — I5030 Unspecified diastolic (congestive) heart failure: Secondary | ICD-10-CM | POA: Diagnosis not present

## 2018-08-01 HISTORY — DX: Cerebral infarction, unspecified: I63.9

## 2018-08-01 HISTORY — DX: Disorientation, unspecified: R41.0

## 2018-08-01 LAB — LIPASE, BLOOD: Lipase: 38 U/L (ref 11–51)

## 2018-08-01 LAB — URINALYSIS, ROUTINE W REFLEX MICROSCOPIC
BILIRUBIN URINE: NEGATIVE
Glucose, UA: NEGATIVE mg/dL
Hgb urine dipstick: NEGATIVE
Ketones, ur: NEGATIVE mg/dL
Leukocytes, UA: NEGATIVE
NITRITE: NEGATIVE
Protein, ur: NEGATIVE mg/dL
SPECIFIC GRAVITY, URINE: 1.013 (ref 1.005–1.030)
pH: 6 (ref 5.0–8.0)

## 2018-08-01 LAB — CBC WITH DIFFERENTIAL/PLATELET
Abs Immature Granulocytes: 0.03 10*3/uL (ref 0.00–0.07)
Basophils Absolute: 0 10*3/uL (ref 0.0–0.1)
Basophils Relative: 1 %
Eosinophils Absolute: 0.1 10*3/uL (ref 0.0–0.5)
Eosinophils Relative: 1 %
HCT: 35.6 % — ABNORMAL LOW (ref 36.0–46.0)
HEMOGLOBIN: 11.3 g/dL — AB (ref 12.0–15.0)
IMMATURE GRANULOCYTES: 1 %
LYMPHS PCT: 19 %
Lymphs Abs: 1.1 10*3/uL (ref 0.7–4.0)
MCH: 30.2 pg (ref 26.0–34.0)
MCHC: 31.7 g/dL (ref 30.0–36.0)
MCV: 95.2 fL (ref 80.0–100.0)
MONO ABS: 0.4 10*3/uL (ref 0.1–1.0)
Monocytes Relative: 7 %
NEUTROS ABS: 4.3 10*3/uL (ref 1.7–7.7)
Neutrophils Relative %: 71 %
Platelets: 142 10*3/uL — ABNORMAL LOW (ref 150–400)
RBC: 3.74 MIL/uL — AB (ref 3.87–5.11)
RDW: 17.1 % — ABNORMAL HIGH (ref 11.5–15.5)
WBC: 5.9 10*3/uL (ref 4.0–10.5)
nRBC: 0 % (ref 0.0–0.2)

## 2018-08-01 LAB — COMPREHENSIVE METABOLIC PANEL
ALBUMIN: 3.2 g/dL — AB (ref 3.5–5.0)
ALK PHOS: 92 U/L (ref 38–126)
ALT: 23 U/L (ref 0–44)
ANION GAP: 9 (ref 5–15)
AST: 39 U/L (ref 15–41)
BUN: 10 mg/dL (ref 8–23)
CALCIUM: 9.2 mg/dL (ref 8.9–10.3)
CO2: 28 mmol/L (ref 22–32)
Chloride: 96 mmol/L — ABNORMAL LOW (ref 98–111)
Creatinine, Ser: 0.81 mg/dL (ref 0.44–1.00)
GFR calc Af Amer: >60 mL/min (ref >60)
GFR calc non Af Amer: >60 mL/min (ref >60)
GLUCOSE: 118 mg/dL — AB (ref 70–99)
Potassium: 2.9 mmol/L — ABNORMAL LOW (ref 3.5–5.1)
SODIUM: 133 mmol/L — AB (ref 135–145)
Total Bilirubin: 0.8 mg/dL (ref 0.3–1.2)
Total Protein: 6.5 g/dL (ref 6.5–8.1)

## 2018-08-01 LAB — PROTIME-INR
INR: 1.04
PROTHROMBIN TIME: 13.5 s (ref 11.4–15.2)

## 2018-08-01 LAB — INFLUENZA PANEL BY PCR (TYPE A & B)
Influenza A By PCR: NEGATIVE
Influenza B By PCR: NEGATIVE

## 2018-08-01 LAB — LACTIC ACID, PLASMA: Lactic Acid, Venous: 2 mmol/L (ref 0.5–1.9)

## 2018-08-01 LAB — TROPONIN I: Troponin I: <0.03 ng/mL (ref <0.03)

## 2018-08-01 MED ORDER — POTASSIUM CHLORIDE 10 MEQ/100ML IV SOLN
10.0000 meq | INTRAVENOUS | Status: AC
  Administered 2018-08-01 – 2018-08-02 (×2): 10 meq via INTRAVENOUS
  Filled 2018-08-01 (×2): qty 100

## 2018-08-01 MED ORDER — SODIUM CHLORIDE 0.9 % IV SOLN
INTRAVENOUS | Status: DC
  Administered 2018-08-01 (×2): via INTRAVENOUS

## 2018-08-01 MED ORDER — CIPROFLOXACIN IN D5W 400 MG/200ML IV SOLN
400.0000 mg | Freq: Once | INTRAVENOUS | Status: AC
  Administered 2018-08-02: 400 mg via INTRAVENOUS
  Filled 2018-08-01: qty 200

## 2018-08-01 MED ORDER — METRONIDAZOLE IN NACL 5-0.79 MG/ML-% IV SOLN
500.0000 mg | Freq: Once | INTRAVENOUS | Status: AC
  Administered 2018-08-01: 500 mg via INTRAVENOUS
  Filled 2018-08-01: qty 100

## 2018-08-01 NOTE — ED Triage Notes (Signed)
Family reports patient has been weak, confused, having chills and running a low grade fever. Has been in the bed for the past 3 days.

## 2018-08-01 NOTE — ED Notes (Signed)
Pt assisted and ambulated to restroom

## 2018-08-01 NOTE — ED Notes (Signed)
Date and time results received: 08/01/18 10:24 PM (use smartphrase ".now" to insert current time)  Test: lactic acid Critical Value: 2.0  Name of Provider Notified: Dr Thurnell Garbe  Orders Received? Or Actions Taken?: no new orders.

## 2018-08-01 NOTE — ED Provider Notes (Signed)
[facility] EMERGENCY DEPARTMENT Provider Note   CSN: [id number] Arrival date & time: 08/01/18  2045     History   Chief Complaint Chief Complaint  Patient presents with  . Altered Mental Status    HPI [name] is a 77 y.o. female.  The history is provided by a relative. The history is limited by the condition of the patient (Hx dementia).  Altered Mental Status    Pt was seen at 2105. Per pt's family: States pt has been generally weak and "more confused than usual" for the past 3 to 4 days. Pt has "just been laying around in the bed." Family questions if pt "might have a UTI." Denies vomiting/diarrhea, no focal motor weakness, no fevers, no cough/SOB.     Past Medical History:  Diagnosis Date  . Anxiety   . Arm fracture, left   . Breast cancer ([place])   . Bulging disc   . Confusion   . Depression   . Difficulty swallowing solids   . GERD (gastroesophageal reflux disease)   . Hypertension   . Hypothyroidism   . Invasive ductal carcinoma of left breast ([place]) 02/24/2014  . Neuropathy   . Osteoporosis   . 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 Rotator cuff disorder, right   . Scoliosis   . Shortness of breath dyspnea    "I have reactive airway disease".    Patient Active Problem List   Diagnosis Date Noted  . Melena 05/07/2018  . Hematemesis 05/07/2018  . Esophageal dysphagia 05/07/2018  . Osteoporosis with pathological fracture of forearm 10/03/2016  . Lumbar radiculopathy 03/13/2016  . Invasive ductal carcinoma of left breast ([place]) 02/24/2014  . 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 Multiple thyroid nodules 06/03/2013  . COLLES' FRACTURE, LEFT WRIST 07/16/2008  . RHEUMATOID ARTHRITIS 07/15/2008    Past Surgical History:  Procedure Laterality Date  . ABDOMINAL HYSTERECTOMY    . Barbie Banner OSTEOTOMY Right 10/28/2014   Procedure: Barbie Banner OSTEOTOMY RIGHT FOOT;  Surgeon: Marcheta Grammes, DPM;  Location: [facility];  Service:  Podiatry;  Laterality: Right;  . BIOPSY  05/15/2018   Procedure: BIOPSY;  Surgeon: Rogene Houston, MD;  Location: [facility];  Service: Endoscopy;;  gastric  . BONE BIOPSY  09/29/2015   Procedure: BONE BIOPSY AND BONE CULTURE SECOND TOE RIGHT FOOT;  Surgeon: Caprice Beaver, DPM;  Location: [facility];  Service: Podiatry;;  . BREAST SURGERY Left    lumpectomy and axillary lymph node with re-excision 2 weeks later   . BUNIONECTOMY Right 10/28/2014   Procedure: SILVER BUNIONECTOMY RIGHT FOOT;  Surgeon: Marcheta Grammes, DPM;  Location: [facility];  Service: Podiatry;  Laterality: Right;  . CATARACT EXTRACTION W/PHACO Left 08/10/2017   Procedure: CATARACT EXTRACTION PHACO AND INTRAOCULAR LENS PLACEMENT LEFT EYE;  Surgeon: Baruch Goldmann, MD;  Location: [facility];  Service: Ophthalmology;  Laterality: Left;  CDE: 13.81  . CATARACT EXTRACTION W/PHACO Right 09/07/2017   Procedure: CATARACT EXTRACTION PHACO AND INTRAOCULAR LENS PLACEMENT RIGHT EYE;  Surgeon: Baruch Goldmann, MD;  Location: [facility];  Service: Ophthalmology;  Laterality: Right;  right  . COLONOSCOPY    . DILATION AND CURETTAGE OF UTERUS    . ESOPHAGEAL DILATION N/A 05/15/2018   Procedure: ESOPHAGEAL DILATION;  Surgeon: Rogene Houston, MD;  Location: [facility];  Service: Endoscopy;  Laterality: N/A;  . esophageal stricture    . ESOPHAGOGASTRODUODENOSCOPY N/A 05/15/2018   Procedure: ESOPHAGOGASTRODUODENOSCOPY (EGD);  Surgeon: Rogene Houston, MD;  Location: [facility];  Service: Endoscopy;  Laterality: N/A;  1:45  . HAMMER TOE SURGERY Right 10/28/2014   Procedure: HAMMER TOE CORRECTION 2ND AND 3RD TOE RIGHT FOOT;  Surgeon: Marcheta Grammes, DPM;  Location: [facility];  Service: Podiatry;  Laterality: Right;  . LUMBAR LAMINECTOMY/DECOMPRESSION MICRODISCECTOMY Left 03/13/2016   Procedure: Left Lumbar Four-Five, Lumbar Five-Sacral One Laminotomy/Foraminotomy;  Surgeon: Kevan Ny Ditty, MD;  Location: [facility];  Service:  Neurosurgery;  Laterality: Left;  . REMOVAL OF IMPLANT Right 09/29/2015   Procedure: REMOVAL OF IMPLANT 2ND TOE RIGHT FOOT;  Surgeon: Caprice Beaver, DPM;  Location: [facility];  Service: Podiatry;  Laterality: Right;     OB History    Gravida  2   Para  2   Term  2   Preterm      AB      Living        SAB      TAB      Ectopic      Multiple      Live Births               Home Medications    Prior to Admission medications   Medication Sig Start Date End Date Taking? Authorizing Provider  albuterol (PROAIR HFA) 108 (90 Base) MCG/ACT inhaler Inhale 2 puffs into the lungs every 6 (six) hours as needed for wheezing or shortness of breath.     [provider]  Calcium Carbonate-Vitamin D (CALTRATE 600+D) 600-400 MG-UNIT tablet Take 1 tablet by mouth 2 (two) times daily.    [provider]  cetirizine (ZYRTEC) 10 MG tablet Take 10 mg by mouth daily as needed for allergies.     [provider]  Cholecalciferol (VITAMIN D) 2000 UNITS CAPS Take 2,000 Units by mouth daily.     [provider]  denosumab (PROLIA) 60 MG/ML SOLN injection Inject 60 mg into the skin every 6 (six) months. Administer in upper arm, thigh, or abdomen, got in June 2019    [provider]  donepezil (ARICEPT) 5 MG tablet Take 5 mg by mouth at bedtime. 04/01/18   [provider]  DULoxetine (CYMBALTA) 60 MG capsule Take 60 mg by mouth daily.    [provider]  folic acid (FOLVITE) 191 MCG tablet Take 800 mcg by mouth daily.    [provider]  HYDROcodone-acetaminophen (NORCO/VICODIN) 5-325 MG tablet Take 1 tablet by mouth daily as needed for pain. 02/04/18   [provider]  lisinopril (PRINIVIL,ZESTRIL) 10 MG tablet Take 5 mg by mouth daily. Son says she takes it as needed when she feels her BP is high, she will check it and then take it if needed.    [provider]  Magnesium 500 MG CAPS Take 500 mg by mouth daily.      [provider]  memantine (NAMENDA) 10 MG tablet Take 10 mg by mouth 2 (two) times daily.    [provider]  methotrexate (RHEUMATREX) 2.5 MG tablet Take 22.5 mg by mouth once a week. Every Tuesday 08/31/15   [provider]  OVER THE COUNTER MEDICATION Take 8 oz by mouth daily as needed (leg pain). Tonic Water with Quinine    [provider]  pantoprazole (PROTONIX) 40 MG tablet Take 1 tablet (40 mg total) by mouth daily. 05/02/18   Setzer, Rona Ravens, NP  potassium chloride (K-DUR) 10 MEQ tablet Take 10 mEq by mouth  at bedtime.    [provider]  senna (EQ NATURAL VEGETABLE LAXATIVE) 8.6 MG tablet Take 1 tablet by mouth daily as needed for constipation.    [provider]  Zinc 50 MG CAPS Take 50 mg by mouth daily.     [provider]    Family History Family History  Problem Relation Age of Onset  . Hypertension Mother   . Cancer Maternal Grandmother   . Cancer Cousin     Social History Social History   Tobacco Use  . Smoking status: Never Smoker  . Smokeless tobacco: Never Used  Substance Use Topics  . Alcohol use: No  . Drug use: No     Allergies   Celecoxib; Ibuprofen; Diphenhydramine; and Wygesic [propoxyphene n-acetaminophen]   Review of Systems Review of Systems  Unable to perform ROS: Dementia     Physical Exam Updated Vital Signs BP (!) 140/103 (BP Location: Left Arm)   Pulse 79   Temp 98.9 F (37.2 C) (Oral)   Ht 4\' 11"  (1.499 m)   Wt 57.1 kg   SpO2 94%   BMI 25.41 kg/m   Physical Exam 2110: Physical examination:  Nursing notes reviewed; Vital signs and O2 SAT reviewed;  Constitutional: Well developed, Well nourished, In no acute distress; Head:  Normocephalic, atraumatic; Eyes: EOMI, PERRL, No scleral icterus; ENMT: Mouth and pharynx normal, Mucous membranes dry; Neck: Supple, Full range of motion, No lymphadenopathy; Cardiovascular: Regular rate and rhythm, No gallop; Respiratory:  Breath sounds clear & equal bilaterally, No wheezes. Normal respiratory effort/excursion; Chest: Nontender, Movement normal; Abdomen: Soft, +diffuse tenderness to palp (pt grimaces and moans/cries out during abd exam). Nondistended, Normal bowel sounds; Genitourinary: No CVA tenderness; Extremities: Peripheral pulses normal, No tenderness, +1 pedal edema bilat. No calf edema or asymmetry.; Neuro: Laying eyes open. No facial droop. Speech minimal. Grips equal. Pt moves all extremities on stretcher without apparent gross focal motor deficits.; Skin: Color normal, Warm, Dry.   ED Treatments / Results  Labs (all labs ordered are listed, but only abnormal results are displayed)   EKG EKG Interpretation  Date/Time:  Thursday August 01 2018 21:13:09 EST Ventricular Rate:  74 PR Interval:    QRS Duration: 111 QT Interval:  421 QTC Calculation: 468 R Axis:   55 Text Interpretation:  Sinus rhythm Minimal ST depression, diffuse leads Baseline wander When compared with ECG of 08/06/2017 diffuse  Nonspecific ST and T wave abnormality is now Present Confirmed by Francine Graven [phone]) on 08/01/2018 9:46:24 PM   Radiology   Procedures Procedures (including critical care time)  Medications Ordered in ED Medications  0.9 %  sodium chloride infusion ( Intravenous New Bag/Given 08/01/18 2133)  potassium chloride 10 mEq in 100 mL IVPB (10 mEq Intravenous New Bag/Given 08/01/18 2300)  ciprofloxacin (CIPRO) IVPB 400 mg (has no administration in time range)  metroNIDAZOLE (FLAGYL) IVPB 500 mg (has no administration in time range)     Initial Impression / Assessment and Plan / ED Course  I have reviewed the triage vital signs and the nursing notes.  Pertinent labs & imaging results that were available during my care of the patient were reviewed by me and considered in my medical decision making (see chart for details).  MDM Reviewed: previous chart, nursing note and vitals Reviewed previous:  labs and ECG Interpretation: labs, ECG, x-ray and CT scan    Results for orders placed or performed during the hospital encounter of 08/01/18  Lipase, blood  Result Value Ref  Range   Lipase 38 11 - 51 U/L  Urinalysis, Routine w reflex microscopic  Result Value Ref Range   Color, Urine YELLOW YELLOW   APPearance CLEAR CLEAR   Specific Gravity, Urine 1.013 1.005 - 1.030   pH 6.0 5.0 - 8.0   Glucose, UA NEGATIVE NEGATIVE mg/dL   Hgb urine dipstick NEGATIVE NEGATIVE   Bilirubin Urine NEGATIVE NEGATIVE   Ketones, ur NEGATIVE NEGATIVE mg/dL   Protein, ur NEGATIVE NEGATIVE mg/dL   Nitrite NEGATIVE NEGATIVE   Leukocytes, UA NEGATIVE NEGATIVE  Comprehensive metabolic panel  Result Value Ref Range   Sodium 133 (L) 135 - 145 mmol/L   Potassium 2.9 (L) 3.5 - 5.1 mmol/L   Chloride 96 (L) 98 - 111 mmol/L   CO2 28 22 - 32 mmol/L   Glucose, Bld 118 (H) 70 - 99 mg/dL   BUN 10 8 - 23 mg/dL   Creatinine, Ser 0.81 0.44 - 1.00 mg/dL   Calcium 9.2 8.9 - 10.3 mg/dL   Total Protein 6.5 6.5 - 8.1 g/dL   Albumin 3.2 (L) 3.5 - 5.0 g/dL   AST 39 15 - 41 U/L   ALT 23 0 - 44 U/L   Alkaline Phosphatase 92 38 - 126 U/L   Total Bilirubin 0.8 0.3 - 1.2 mg/dL   GFR calc non Af Amer >60 >60 mL/min   GFR calc Af Amer >60 >60 mL/min   Anion gap 9 5 - 15  Troponin I - Once  Result Value Ref Range   Troponin I <0.03 <0.03 ng/mL  Lactic acid, plasma  Result Value Ref Range   Lactic Acid, Venous 2.0 (HH) 0.5 - 1.9 mmol/L  CBC with Differential  Result Value Ref Range   WBC 5.9 4.0 - 10.5 K/uL   RBC 3.74 (L) 3.87 - 5.11 MIL/uL   Hemoglobin 11.3 (L) 12.0 - 15.0 g/dL   HCT 35.6 (L) 36.0 - 46.0 %   MCV 95.2 80.0 - 100.0 fL   MCH 30.2 26.0 - 34.0 pg   MCHC 31.7 30.0 - 36.0 g/dL   RDW 17.1 (H) 11.5 - 15.5 %   Platelets 142 (L) 150 - 400 K/uL   nRBC 0.0 0.0 - 0.2 %   Neutrophils Relative % 71 %   Neutro Abs 4.3 1.7 - 7.7 K/uL   Lymphocytes Relative 19 %   Lymphs Abs 1.1 0.7 - 4.0 K/uL   Monocytes  Relative 7 %   Monocytes Absolute 0.4 0.1 - 1.0 K/uL   Eosinophils Relative 1 %   Eosinophils Absolute 0.1 0.0 - 0.5 K/uL   Basophils Relative 1 %   Basophils Absolute 0.0 0.0 - 0.1 K/uL   Immature Granulocytes 1 %   Abs Immature Granulocytes 0.03 0.00 - 0.07 K/uL  Protime-INR  Result Value Ref Range   Prothrombin Time 13.5 11.4 - 15.2 seconds   INR 1.04   Influenza panel by PCR (type A & B)  Result Value Ref Range   Influenza A By PCR NEGATIVE NEGATIVE   Influenza B By PCR NEGATIVE NEGATIVE   Ct Abdomen Pelvis Wo Contrast Result Date: 08/01/2018 CLINICAL DATA:  Initial evaluation for acute generalized abdominal pain. EXAM: CT ABDOMEN AND PELVIS WITHOUT CONTRAST TECHNIQUE: Multidetector CT imaging of the abdomen and pelvis was performed following the standard protocol without IV contrast. COMPARISON:  None available. FINDINGS: Lower chest: Mild scattered atelectatic changes present within the visualized lung bases, left greater than right. Visualized lungs are otherwise grossly clear. Scattered coronary artery  calcifications partially visualized. Hepatobiliary: Limited noncontrast evaluation liver unremarkable. Gallbladder contracted without acute abnormality. No biliary dilatation. Pancreas: Pancreas within normal limits. Spleen: Spleen within normal limits. Adrenals/Urinary Tract: Adrenal glands unremarkable. Left kidney slightly atrophic as compared to the right. No nephrolithiasis or hydronephrosis. 14 mm cyst present within the interpolar right kidney. No appreciable radiopaque calculi seen along the course of either ureter. No hydroureter. Partially distended bladder without acute abnormality. Small right-sided bladder diverticulum noted. Stomach/Bowel: Large hiatal hernia. Stomach otherwise unremarkable. No evidence for bowel obstruction. Hazy inflammatory stranding with wall thickening about the proximal ascending colon within the right abdomen, suspicious for possible acute colitis  (series 2, image 43). No other acute inflammatory changes seen about the bowels. Appendix within normal limits. Few scattered colonic diverticula noted. Vascular/Lymphatic: Tortuosity the intraabdominal aorta noted. Mild aortic atherosclerosis. No appreciable aneurysm. No intra-abdominal or pelvic adenopathy identified on this noncontrast examination. Reproductive: Uterus is absent. Normal right ovary. Left ovary not seen. Other: No free air or fluid. Musculoskeletal: Severe lumbar levoscoliosis with apex at L2-3. Associated severe multilevel degenerative spondylolysis with facet arthrosis. Chronic compression deformities noted at T12 and L1. No acute osseous abnormality. No discrete lytic or blastic osseous lesions. IMPRESSION: 1. Hazy inflammatory stranding with wall thickening about the proximal ascending colon, suspicious for acute colitis. No complication identified. 2. No other acute intra-abdominal or pelvic process. 3. Large hiatal hernia. 4. Mild sigmoid diverticulosis without evidence for acute diverticulitis. 5. Severe lumbar levoscoliosis with advanced multilevel degenerative spondylolysis and facet arthrosis. Electronically Signed   By: Jeannine Boga M.D.   On: 08/01/2018 23:30   Dg Chest 1 View Result Date: 08/01/2018 CLINICAL DATA:  Altered mental status. EXAM: CHEST  1 VIEW COMPARISON:  Radiographs 07/13/2009 FINDINGS: Upper normal heart size. Moderate retrocardiac hiatal hernia. Pulmonary vasculature is normal. No consolidation, pleural effusion, or pneumothorax. No acute osseous abnormalities are seen. IMPRESSION: 1. No acute pulmonary process. 2. Moderate hiatal hernia. Electronically Signed   By: Keith Rake M.D.   On: 08/01/2018 22:50   Ct Head Wo Contrast Result Date: 08/01/2018 CLINICAL DATA:  Confusion EXAM: CT HEAD WITHOUT CONTRAST TECHNIQUE: Contiguous axial images were obtained from the base of the skull through the vertex without intravenous contrast. COMPARISON:   None. FINDINGS: Brain: No territorial infarction, hemorrhage or intracranial mass. Small focal hypodensity in the right basal ganglia. Atrophy with moderate small vessel ischemic changes of the white matter. Mildly prominent ventricles felt secondary to atrophy Vascular: No hyperdense vessels.  Carotid vascular calcification Skull: Normal. Negative for fracture or focal lesion. Sinuses/Orbits: No acute finding. Other: None IMPRESSION: 1. Small focal hypodensity in the right basal ganglia may reflect age indeterminate lacunar infarct. Negative for hemorrhage. 2. Atrophy and small vessel ischemic changes of the white matter. Electronically Signed   By: Donavan Foil M.D.   On: 08/01/2018 23:20     2345:  CT scan with possible infarct as well as colitis. IV abx started. Potassium repleted IV. Dx and testing d/w pt and family.  Questions answered.  Verb understanding, agreeable to admit.  T/C returned from [facility] Dr. Myna Hidalgo, case discussed, including:  HPI, pertinent PM/SHx, VS/PE, dx testing, ED course and treatment:  Agreeable to admit.         Final Clinical Impressions(s) / ED Diagnoses   Final diagnoses:  None    ED Discharge Orders    None       Francine Graven, DO 08/05/18 [id number]

## 2018-08-02 ENCOUNTER — Encounter (HOSPITAL_COMMUNITY): Payer: Self-pay | Admitting: Family Medicine

## 2018-08-02 ENCOUNTER — Other Ambulatory Visit: Payer: Self-pay

## 2018-08-02 ENCOUNTER — Observation Stay (HOSPITAL_COMMUNITY): Payer: PPO

## 2018-08-02 DIAGNOSIS — E876 Hypokalemia: Secondary | ICD-10-CM | POA: Diagnosis present

## 2018-08-02 DIAGNOSIS — R410 Disorientation, unspecified: Secondary | ICD-10-CM | POA: Diagnosis not present

## 2018-08-02 DIAGNOSIS — K219 Gastro-esophageal reflux disease without esophagitis: Secondary | ICD-10-CM | POA: Diagnosis present

## 2018-08-02 DIAGNOSIS — Z853 Personal history of malignant neoplasm of breast: Secondary | ICD-10-CM | POA: Diagnosis not present

## 2018-08-02 DIAGNOSIS — E871 Hypo-osmolality and hyponatremia: Secondary | ICD-10-CM

## 2018-08-02 DIAGNOSIS — R29700 NIHSS score 0: Secondary | ICD-10-CM | POA: Diagnosis present

## 2018-08-02 DIAGNOSIS — M069 Rheumatoid arthritis, unspecified: Secondary | ICD-10-CM | POA: Diagnosis present

## 2018-08-02 DIAGNOSIS — I5030 Unspecified diastolic (congestive) heart failure: Secondary | ICD-10-CM | POA: Diagnosis not present

## 2018-08-02 DIAGNOSIS — G629 Polyneuropathy, unspecified: Secondary | ICD-10-CM | POA: Diagnosis present

## 2018-08-02 DIAGNOSIS — Z886 Allergy status to analgesic agent status: Secondary | ICD-10-CM | POA: Diagnosis not present

## 2018-08-02 DIAGNOSIS — I10 Essential (primary) hypertension: Secondary | ICD-10-CM | POA: Diagnosis present

## 2018-08-02 DIAGNOSIS — I639 Cerebral infarction, unspecified: Secondary | ICD-10-CM

## 2018-08-02 DIAGNOSIS — F0390 Unspecified dementia without behavioral disturbance: Secondary | ICD-10-CM | POA: Diagnosis present

## 2018-08-02 DIAGNOSIS — G9349 Other encephalopathy: Secondary | ICD-10-CM | POA: Diagnosis present

## 2018-08-02 DIAGNOSIS — K222 Esophageal obstruction: Secondary | ICD-10-CM | POA: Diagnosis present

## 2018-08-02 DIAGNOSIS — E785 Hyperlipidemia, unspecified: Secondary | ICD-10-CM | POA: Diagnosis present

## 2018-08-02 DIAGNOSIS — Z79899 Other long term (current) drug therapy: Secondary | ICD-10-CM | POA: Diagnosis not present

## 2018-08-02 DIAGNOSIS — M810 Age-related osteoporosis without current pathological fracture: Secondary | ICD-10-CM | POA: Diagnosis present

## 2018-08-02 DIAGNOSIS — I63513 Cerebral infarction due to unspecified occlusion or stenosis of bilateral middle cerebral arteries: Secondary | ICD-10-CM | POA: Diagnosis not present

## 2018-08-02 DIAGNOSIS — Z7951 Long term (current) use of inhaled steroids: Secondary | ICD-10-CM | POA: Diagnosis not present

## 2018-08-02 DIAGNOSIS — D649 Anemia, unspecified: Secondary | ICD-10-CM | POA: Diagnosis present

## 2018-08-02 DIAGNOSIS — Z888 Allergy status to other drugs, medicaments and biological substances status: Secondary | ICD-10-CM | POA: Diagnosis not present

## 2018-08-02 DIAGNOSIS — I6389 Other cerebral infarction: Secondary | ICD-10-CM | POA: Diagnosis not present

## 2018-08-02 DIAGNOSIS — K529 Noninfective gastroenteritis and colitis, unspecified: Secondary | ICD-10-CM | POA: Diagnosis present

## 2018-08-02 DIAGNOSIS — E039 Hypothyroidism, unspecified: Secondary | ICD-10-CM | POA: Diagnosis present

## 2018-08-02 DIAGNOSIS — D696 Thrombocytopenia, unspecified: Secondary | ICD-10-CM | POA: Diagnosis present

## 2018-08-02 HISTORY — DX: Cerebral infarction, unspecified: I63.9

## 2018-08-02 LAB — BASIC METABOLIC PANEL
Anion gap: 6 (ref 5–15)
BUN: 9 mg/dL (ref 8–23)
CALCIUM: 8.7 mg/dL — AB (ref 8.9–10.3)
CO2: 25 mmol/L (ref 22–32)
Chloride: 101 mmol/L (ref 98–111)
Creatinine, Ser: 0.68 mg/dL (ref 0.44–1.00)
GFR calc non Af Amer: >60 mL/min (ref >60)
Glucose, Bld: 98 mg/dL (ref 70–99)
Potassium: 3 mmol/L — ABNORMAL LOW (ref 3.5–5.1)
Sodium: 132 mmol/L — ABNORMAL LOW (ref 135–145)

## 2018-08-02 LAB — CBC WITH DIFFERENTIAL/PLATELET
Abs Immature Granulocytes: 0.02 10*3/uL (ref 0.00–0.07)
Basophils Absolute: 0 10*3/uL (ref 0.0–0.1)
Basophils Relative: 1 %
EOS PCT: 2 %
Eosinophils Absolute: 0.1 10*3/uL (ref 0.0–0.5)
HCT: 33.3 % — ABNORMAL LOW (ref 36.0–46.0)
Hemoglobin: 10.5 g/dL — ABNORMAL LOW (ref 12.0–15.0)
Immature Granulocytes: 0 %
Lymphocytes Relative: 19 %
Lymphs Abs: 1 10*3/uL (ref 0.7–4.0)
MCH: 30.3 pg (ref 26.0–34.0)
MCHC: 31.5 g/dL (ref 30.0–36.0)
MCV: 96.2 fL (ref 80.0–100.0)
Monocytes Absolute: 0.5 10*3/uL (ref 0.1–1.0)
Monocytes Relative: 8 %
Neutro Abs: 3.8 10*3/uL (ref 1.7–7.7)
Neutrophils Relative %: 70 %
Platelets: 142 10*3/uL — ABNORMAL LOW (ref 150–400)
RBC: 3.46 MIL/uL — ABNORMAL LOW (ref 3.87–5.11)
RDW: 17.2 % — ABNORMAL HIGH (ref 11.5–15.5)
WBC: 5.4 10*3/uL (ref 4.0–10.5)
nRBC: 0.4 % — ABNORMAL HIGH (ref 0.0–0.2)

## 2018-08-02 LAB — MAGNESIUM: Magnesium: 1.7 mg/dL (ref 1.7–2.4)

## 2018-08-02 LAB — HEMOGLOBIN A1C
Hgb A1c MFr Bld: 5 % (ref 4.8–5.6)
Mean Plasma Glucose: 96.8 mg/dL

## 2018-08-02 LAB — ECHOCARDIOGRAM COMPLETE
Height: 59 in
Weight: 2081.14 oz

## 2018-08-02 LAB — LACTIC ACID, PLASMA: Lactic Acid, Venous: 1.9 mmol/L (ref 0.5–1.9)

## 2018-08-02 MED ORDER — ONDANSETRON HCL 4 MG/2ML IJ SOLN: 4.0000 mg | Freq: Four times a day (QID) | INTRAMUSCULAR | Status: DC | PRN

## 2018-08-02 MED ORDER — DONEPEZIL HCL 5 MG PO TABS
5.0000 mg | ORAL_TABLET | Freq: Every day | ORAL | Status: DC
  Administered 2018-08-02 – 2018-08-04 (×3): 5 mg via ORAL
  Filled 2018-08-02 (×4): qty 1

## 2018-08-02 MED ORDER — SODIUM CHLORIDE 0.9% FLUSH
3.0000 mL | Freq: Two times a day (BID) | INTRAVENOUS | Status: DC
  Administered 2018-08-02: 3 mL via INTRAVENOUS
  Administered 2018-08-02: 10 mL via INTRAVENOUS
  Administered 2018-08-04 – 2018-08-06 (×5): 3 mL via INTRAVENOUS

## 2018-08-02 MED ORDER — ALBUTEROL SULFATE (2.5 MG/3ML) 0.083% IN NEBU: 3.0000 mL | INHALATION_SOLUTION | Freq: Four times a day (QID) | RESPIRATORY_TRACT | Status: DC | PRN

## 2018-08-02 MED ORDER — ONDANSETRON HCL 4 MG PO TABS: 4.0000 mg | ORAL_TABLET | Freq: Four times a day (QID) | ORAL | Status: DC | PRN

## 2018-08-02 MED ORDER — CIPROFLOXACIN IN D5W 400 MG/200ML IV SOLN
400.0000 mg | Freq: Two times a day (BID) | INTRAVENOUS | Status: DC
  Administered 2018-08-02 – 2018-08-05 (×6): 400 mg via INTRAVENOUS
  Filled 2018-08-02 (×7): qty 200

## 2018-08-02 MED ORDER — POTASSIUM CHLORIDE CRYS ER 20 MEQ PO TBCR
20.0000 meq | EXTENDED_RELEASE_TABLET | Freq: Once | ORAL | Status: AC
  Administered 2018-08-02: 20 meq via ORAL
  Filled 2018-08-02: qty 1

## 2018-08-02 MED ORDER — DULOXETINE HCL 60 MG PO CPEP
60.0000 mg | ORAL_CAPSULE | Freq: Every day | ORAL | Status: DC
  Administered 2018-08-02 – 2018-08-06 (×4): 60 mg via ORAL
  Filled 2018-08-02: qty 1
  Filled 2018-08-02: qty 2
  Filled 2018-08-02 (×3): qty 1

## 2018-08-02 MED ORDER — PANTOPRAZOLE SODIUM 40 MG PO TBEC
40.0000 mg | DELAYED_RELEASE_TABLET | Freq: Every day | ORAL | Status: DC
  Administered 2018-08-02 – 2018-08-03 (×2): 40 mg via ORAL
  Administered 2018-08-04: 20 mg via ORAL
  Administered 2018-08-06: 40 mg via ORAL
  Filled 2018-08-02 (×5): qty 1

## 2018-08-02 MED ORDER — METRONIDAZOLE IN NACL 5-0.79 MG/ML-% IV SOLN
500.0000 mg | Freq: Three times a day (TID) | INTRAVENOUS | Status: DC
  Administered 2018-08-02 – 2018-08-05 (×10): 500 mg via INTRAVENOUS
  Filled 2018-08-02 (×10): qty 100

## 2018-08-02 MED ORDER — ENOXAPARIN SODIUM 40 MG/0.4ML ~~LOC~~ SOLN
40.0000 mg | SUBCUTANEOUS | Status: DC
  Administered 2018-08-02 – 2018-08-06 (×4): 40 mg via SUBCUTANEOUS
  Filled 2018-08-02 (×5): qty 0.4

## 2018-08-02 MED ORDER — SODIUM CHLORIDE 0.9 % IV SOLN
INTRAVENOUS | Status: AC
  Administered 2018-08-02: 15:00:00 via INTRAVENOUS

## 2018-08-02 MED ORDER — POTASSIUM CHLORIDE 10 MEQ/100ML IV SOLN
10.0000 meq | INTRAVENOUS | Status: AC
  Administered 2018-08-02 (×4): 10 meq via INTRAVENOUS
  Filled 2018-08-02 (×4): qty 100

## 2018-08-02 MED ORDER — ACETAMINOPHEN 650 MG RE SUPP: 650.0000 mg | Freq: Four times a day (QID) | RECTAL | Status: DC | PRN

## 2018-08-02 MED ORDER — SODIUM CHLORIDE 0.9 % IV SOLN: INTRAVENOUS | Status: DC

## 2018-08-02 MED ORDER — POTASSIUM CHLORIDE 10 MEQ/100ML IV SOLN
INTRAVENOUS | Status: AC
  Administered 2018-08-02: 10 meq via INTRAVENOUS
  Filled 2018-08-02: qty 100

## 2018-08-02 MED ORDER — HYDROCODONE-ACETAMINOPHEN 5-325 MG PO TABS: 1.0000 | ORAL_TABLET | ORAL | Status: DC | PRN

## 2018-08-02 MED ORDER — POTASSIUM CHLORIDE 10 MEQ/100ML IV SOLN
10.0000 meq | INTRAVENOUS | Status: AC
  Administered 2018-08-02 (×2): 10 meq via INTRAVENOUS
  Filled 2018-08-02: qty 100

## 2018-08-02 MED ORDER — STROKE: EARLY STAGES OF RECOVERY BOOK
Freq: Once | Status: AC
  Administered 2018-08-02: 12:00:00
  Filled 2018-08-02: qty 1

## 2018-08-02 MED ORDER — ACETAMINOPHEN 325 MG PO TABS
650.0000 mg | ORAL_TABLET | Freq: Four times a day (QID) | ORAL | Status: DC | PRN
  Administered 2018-08-03: 650 mg via ORAL
  Filled 2018-08-02: qty 2

## 2018-08-02 MED ORDER — SENNOSIDES-DOCUSATE SODIUM 8.6-50 MG PO TABS: 1.0000 | ORAL_TABLET | Freq: Every evening | ORAL | Status: DC | PRN

## 2018-08-02 MED ORDER — MEMANTINE HCL 10 MG PO TABS
10.0000 mg | ORAL_TABLET | Freq: Two times a day (BID) | ORAL | Status: DC
  Administered 2018-08-02 – 2018-08-06 (×7): 10 mg via ORAL
  Filled 2018-08-02 (×9): qty 1

## 2018-08-02 NOTE — H&P (Signed)
History and Physical    [name]:[id number] DOB: Mar 10, 1941 DOA: 08/01/2018  PCP: Celene Squibb, MD   Patient coming from: Home   Chief Complaint: Lethargy, increased confusion, abdominal pain   HPI: [name] is a 77 y.o. female with medical history significant for dementia, hypertension, and rheumatoid arthritis, now presenting to the emergency department for evaluation of 3 days of lethargy, increased confusion, and abdominal pain.  Patient's daughter is at the bedside and assists with the history.  Patient has reportedly been laying in bed for the past 3 days or so, noted to be more confused than usual, and complained of some abdominal pain.  She has not been coughing, vomiting, and has not appeared to be short of breath.  She had a formed stool today without melena or hematochezia.  She fell in April and hit her head, but no fall or trauma since that time.  Patient's daughter reports that the patient has been drinking a lot of water for the past couple days.  Daughter reports that patient has had similar symptoms previously in the setting of UTI.  ED Course: Upon arrival to the ED, patient is found to be afebrile, saturating well on room air, and with vitals otherwise normal.  EKG features a sinus rhythm with nonspecific repolarization abnormality.  Chest x-ray is negative for acute cardiopulmonary disease.  Noncontrast head CT reveals a small focal hypodensity in the right basal ganglia, possibly reflecting an age-indeterminate lacunar infarct.  CT of the abdomen and pelvis reveals hazy inflammatory stranding about the proximal ascending colon with wall thickening suspicious for acute colitis.  Chemistry panel is notable for mild hyponatremia and potassium 2.9.  CBC is notable for mild normocytic anemia and chronic mild thrombocytopenia.  Influenza PCR is negative.  Lactic acid is 2.0.  Troponin is undetectable and urinalysis unremarkable.  Patient was given 20 mEq of IV potassium  and treated with ciprofloxacin and Flagyl in the ED.  She is started on normal saline infusion.  She remains hemodynamically stable, in no acute distress, but lethargic and confused.  She will be observed for ongoing evaluation and management acute ascending colitis and possible age-indeterminate ischemic CVA.  Review of Systems:  All other systems reviewed and apart from HPI, are negative.  Past Medical History:  Diagnosis Date  . Anxiety   . Arm fracture, left   . Breast cancer ([facility])   . Bulging disc   . Confusion   . Depression   . Difficulty swallowing solids   . GERD (gastroesophageal reflux disease)   . Hypertension   . Hypothyroidism   . Invasive ductal carcinoma of left breast ([place]) 02/24/2014  . Neuropathy   . Osteoporosis   . 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 Rotator cuff disorder, right   . Scoliosis   . Shortness of breath dyspnea    "I have reactive airway disease".    Past Surgical History:  Procedure Laterality Date  . ABDOMINAL HYSTERECTOMY    . Barbie Banner OSTEOTOMY Right 10/28/2014   Procedure: Barbie Banner OSTEOTOMY RIGHT FOOT;  Surgeon: Marcheta Grammes, DPM;  Location: [facility];  Service: Podiatry;  Laterality: Right;  . BIOPSY  05/15/2018   Procedure: BIOPSY;  Surgeon: Rogene Houston, MD;  Location: [facility];  Service: Endoscopy;;  gastric  . BONE BIOPSY  09/29/2015   Procedure: BONE BIOPSY AND BONE CULTURE SECOND TOE RIGHT FOOT;  Surgeon: Caprice Beaver, DPM;  Location: [facility];  Service: Podiatry;;  .  BREAST SURGERY Left    lumpectomy and axillary lymph node with re-excision 2 weeks later   . BUNIONECTOMY Right 10/28/2014   Procedure: SILVER BUNIONECTOMY RIGHT FOOT;  Surgeon: Marcheta Grammes, DPM;  Location: [facility];  Service: Podiatry;  Laterality: Right;  . CATARACT EXTRACTION W/PHACO Left 08/10/2017   Procedure: CATARACT EXTRACTION PHACO AND INTRAOCULAR LENS PLACEMENT LEFT EYE;  Surgeon: Baruch Goldmann, MD;  Location: [facility];  Service:  Ophthalmology;  Laterality: Left;  CDE: 13.81  . CATARACT EXTRACTION W/PHACO Right 09/07/2017   Procedure: CATARACT EXTRACTION PHACO AND INTRAOCULAR LENS PLACEMENT RIGHT EYE;  Surgeon: Baruch Goldmann, MD;  Location: [facility];  Service: Ophthalmology;  Laterality: Right;  right  . COLONOSCOPY    . DILATION AND CURETTAGE OF UTERUS    . ESOPHAGEAL DILATION N/A 05/15/2018   Procedure: ESOPHAGEAL DILATION;  Surgeon: Rogene Houston, MD;  [facility];  Service: Endoscopy;  Laterality: N/A;  . esophageal stricture    . ESOPHAGOGASTRODUODENOSCOPY N/A 05/15/2018   Procedure: ESOPHAGOGASTRODUODENOSCOPY (EGD);  Surgeon: Rogene Houston, MD;  [facility];  Service: Endoscopy;  Laterality: N/A;  1:45  . HAMMER TOE SURGERY Right 10/28/2014   Procedure: HAMMER TOE CORRECTION 2ND AND 3RD TOE RIGHT FOOT;  Surgeon: Marcheta Grammes, DPM;  Location: [facility];  Service: Podiatry;  Laterality: Right;  . LUMBAR LAMINECTOMY/DECOMPRESSION MICRODISCECTOMY Left 03/13/2016   Procedure: Left Lumbar Four-Five, Lumbar Five-Sacral One Laminotomy/Foraminotomy;  Surgeon: Kevan Ny Ditty, MD;  Location: [facility];  Service: Neurosurgery;  Laterality: Left;  . REMOVAL OF IMPLANT Right 09/29/2015   Procedure: REMOVAL OF IMPLANT 2ND TOE RIGHT FOOT;  Surgeon: Caprice Beaver, DPM;  Location: [facility];  Service: Podiatry;  Laterality: Right;     reports that she has never smoked. She has never used smokeless tobacco. She reports that she does not drink alcohol or use drugs.  Allergies  Allergen Reactions  . Celecoxib Swelling and Other (See Comments)  . Ibuprofen Swelling and Other (See Comments)  . Diphenhydramine Rash and Other (See Comments)    Wide awake.   Micheline Maze [Propoxyphene N-Acetaminophen] Nausea And Vomiting    Family History  Problem Relation Age of Onset  . Hypertension Mother   . Cancer Maternal Grandmother   . Cancer Cousin      Prior to Admission medications   Medication  Sig Start Date End Date Taking? Authorizing Provider  albuterol (PROAIR HFA) 108 (90 Base) MCG/ACT inhaler Inhale 2 puffs into the lungs every 6 (six) hours as needed for wheezing or shortness of breath.     [provider]  Calcium Carbonate-Vitamin D (CALTRATE 600+D) 600-400 MG-UNIT tablet Take 1 tablet by mouth 2 (two) times daily.    [provider]  cetirizine (ZYRTEC) 10 MG tablet Take 10 mg by mouth daily as needed for allergies.     [provider]  Cholecalciferol (VITAMIN D) 2000 UNITS CAPS Take 2,000 Units by mouth daily.     [provider]  denosumab (PROLIA) 60 MG/ML SOLN injection Inject 60 mg into the skin every 6 (six) months. Administer in upper arm, thigh, or abdomen, got in June 2019    [provider]  donepezil (ARICEPT) 5 MG tablet Take 5 mg by mouth at bedtime. 04/01/18   [provider]  DULoxetine (CYMBALTA) 60 MG capsule Take 60 mg by mouth daily.    [provider]  folic acid (FOLVITE) 093 MCG tablet Take 800 mcg by mouth daily.  [provider]  HYDROcodone-acetaminophen (NORCO/VICODIN) 5-325 MG tablet Take 1 tablet by mouth daily as needed for pain. 02/04/18   [provider]  lisinopril (PRINIVIL,ZESTRIL) 10 MG tablet Take 5 mg by mouth daily. Son says she takes it as needed when she feels her BP is high, she will check it and then take it if needed.    [provider]  Magnesium 500 MG CAPS Take 500 mg by mouth daily.     [provider]  memantine (NAMENDA) 10 MG tablet Take 10 mg by mouth 2 (two) times daily.    [provider]  methotrexate (RHEUMATREX) 2.5 MG tablet Take 22.5 mg by mouth once a week. Every Tuesday 08/31/15   [provider]  OVER THE COUNTER MEDICATION Take 8 oz by mouth daily as needed (leg pain). Tonic Water with Quinine    [provider]  pantoprazole (PROTONIX) 40 MG tablet Take 1 tablet (40 mg total) by mouth daily.  05/02/18   Setzer, Rona Ravens, NP  potassium chloride (K-DUR) 10 MEQ tablet Take 10 mEq by mouth at bedtime.    [provider]  senna (EQ NATURAL VEGETABLE LAXATIVE) 8.6 MG tablet Take 1 tablet by mouth daily as needed for constipation.    [provider]  Zinc 50 MG CAPS Take 50 mg by mouth daily.     [provider]    Physical Exam: Vitals:   08/01/18 2054 08/01/18 2130 08/01/18 2200 08/01/18 2300  BP: (!) 140/103 135/60 (!) 156/64 (!) 159/68  Pulse: 79 78 78 80  Resp:  20 14 15   Temp: 98.9 F (37.2 C)     TempSrc: Oral     SpO2: 94% 98% 100% 100%  Weight: 57.1 kg     Height: 4\' 11"  (1.499 m)       Constitutional: NAD, calm  Eyes: PERTLA, lids and conjunctivae normal ENMT: Mucous membranes are moist. Posterior pharynx clear of any exudate or lesions.   Neck: normal, supple, no masses, no thyromegaly Respiratory: clear to auscultation bilaterally, no wheezing, no crackles. Normal respiratory effort.    Cardiovascular: S1 & S2 heard, regular rate and rhythm. Pretibial pitting edema bilaterally. Abdomen: No distension, soft, tender on the right without rebound pain or guarding. Bowel sounds active.  Musculoskeletal: no clubbing / cyanosis. No joint deformity upper and lower extremities.   Skin: no significant rashes, lesions, ulcers. Warm, dry, well-perfused. Neurologic: no gross facial asymmetry. PERRL. Sensation intact, patellar DTR intact. Moving all extremities.   Psychiatric: Calm. Not speaking.    Labs on Admission: I have personally reviewed following labs and imaging studies  CBC: Recent Labs  Lab 08/01/18 2127  WBC 5.9  NEUTROABS 4.3  HGB 11.3*  HCT 35.6*  MCV 95.2  PLT 517*   Basic Metabolic Panel: Recent Labs  Lab 08/01/18 2127  NA 133*  K 2.9*  CL 96*  CO2 28  GLUCOSE 118*  BUN 10  CREATININE 0.81  CALCIUM 9.2   GFR: Estimated Creatinine Clearance: 43.4 mL/min (by C-G formula based on SCr of 0.81 mg/dL). Liver Function  Tests: Recent Labs  Lab 08/01/18 2127  AST 39  ALT 23  ALKPHOS 92  BILITOT 0.8  PROT 6.5  ALBUMIN 3.2*   Recent Labs  Lab 08/01/18 2127  LIPASE 38   No results for input(s): AMMONIA in the last 168 hours. Coagulation Profile: Recent Labs  Lab 08/01/18 2127  INR 1.04   Cardiac Enzymes: Recent Labs  Lab 08/01/18  2127  TROPONINI <0.03   BNP (last 3 results) No results for input(s): PROBNP in the last 8760 hours. HbA1C: No results for input(s): HGBA1C in the last 72 hours. CBG: No results for input(s): GLUCAP in the last 168 hours. Lipid Profile: No results for input(s): CHOL, HDL, LDLCALC, TRIG, CHOLHDL, LDLDIRECT in the last 72 hours. Thyroid Function Tests: No results for input(s): TSH, T4TOTAL, FREET4, T3FREE, THYROIDAB in the last 72 hours. Anemia Panel: No results for input(s): VITAMINB12, FOLATE, FERRITIN, TIBC, IRON, RETICCTPCT in the last 72 hours. Urine analysis:    Component Value Date/Time   COLORURINE YELLOW 08/01/2018 2150   APPEARANCEUR CLEAR 08/01/2018 2150   LABSPEC 1.013 08/01/2018 2150   PHURINE 6.0 08/01/2018 2150   GLUCOSEU NEGATIVE 08/01/2018 2150   HGBUR NEGATIVE 08/01/2018 2150   BILIRUBINUR NEGATIVE 08/01/2018 2150   KETONESUR NEGATIVE 08/01/2018 2150   PROTEINUR NEGATIVE 08/01/2018 2150   NITRITE NEGATIVE 08/01/2018 2150   LEUKOCYTESUR NEGATIVE 08/01/2018 2150   Sepsis Labs: @LABRCNTIP (procalcitonin:4,lacticidven:4) )No results found for this or any previous visit (from the past 240 hour(s)).   Radiological Exams on Admission: Ct Abdomen Pelvis Wo Contrast  Result Date: 08/01/2018 CLINICAL DATA:  Initial evaluation for acute generalized abdominal pain. EXAM: CT ABDOMEN AND PELVIS WITHOUT CONTRAST TECHNIQUE: Multidetector CT imaging of the abdomen and pelvis was performed following the standard protocol without IV contrast. COMPARISON:  None available. FINDINGS: Lower chest: Mild scattered atelectatic changes present within the  visualized lung bases, left greater than right. Visualized lungs are otherwise grossly clear. Scattered coronary artery calcifications partially visualized. Hepatobiliary: Limited noncontrast evaluation liver unremarkable. Gallbladder contracted without acute abnormality. No biliary dilatation. Pancreas: Pancreas within normal limits. Spleen: Spleen within normal limits. Adrenals/Urinary Tract: Adrenal glands unremarkable. Left kidney slightly atrophic as compared to the right. No nephrolithiasis or hydronephrosis. 14 mm cyst present within the interpolar right kidney. No appreciable radiopaque calculi seen along the course of either ureter. No hydroureter. Partially distended bladder without acute abnormality. Small right-sided bladder diverticulum noted. Stomach/Bowel: Large hiatal hernia. Stomach otherwise unremarkable. No evidence for bowel obstruction. Hazy inflammatory stranding with wall thickening about the proximal ascending colon within the right abdomen, suspicious for possible acute colitis (series 2, image 43). No other acute inflammatory changes seen about the bowels. Appendix within normal limits. Few scattered colonic diverticula noted. Vascular/Lymphatic: Tortuosity the intraabdominal aorta noted. Mild aortic atherosclerosis. No appreciable aneurysm. No intra-abdominal or pelvic adenopathy identified on this noncontrast examination. Reproductive: Uterus is absent. Normal right ovary. Left ovary not seen. Other: No free air or fluid. Musculoskeletal: Severe lumbar levoscoliosis with apex at L2-3. Associated severe multilevel degenerative spondylolysis with facet arthrosis. Chronic compression deformities noted at T12 and L1. No acute osseous abnormality. No discrete lytic or blastic osseous lesions. IMPRESSION: 1. Hazy inflammatory stranding with wall thickening about the proximal ascending colon, suspicious for acute colitis. No complication identified. 2. No other acute intra-abdominal or pelvic  process. 3. Large hiatal hernia. 4. Mild sigmoid diverticulosis without evidence for acute diverticulitis. 5. Severe lumbar levoscoliosis with advanced multilevel degenerative spondylolysis and facet arthrosis. Electronically Signed   By: Jeannine Boga M.D.   On: 08/01/2018 23:30   Dg Chest 1 View  Result Date: 08/01/2018 CLINICAL DATA:  Altered mental status. EXAM: CHEST  1 VIEW COMPARISON:  Radiographs 07/13/2009 FINDINGS: Upper normal heart size. Moderate retrocardiac hiatal hernia. Pulmonary vasculature is normal. No consolidation, pleural effusion, or pneumothorax. No acute osseous abnormalities are seen. IMPRESSION: 1. No acute pulmonary process. 2. Moderate hiatal  hernia. Electronically Signed   By: Keith Rake M.D.   On: 08/01/2018 22:50   Ct Head Wo Contrast  Result Date: 08/01/2018 CLINICAL DATA:  Confusion EXAM: CT HEAD WITHOUT CONTRAST TECHNIQUE: Contiguous axial images were obtained from the base of the skull through the vertex without intravenous contrast. COMPARISON:  None. FINDINGS: Brain: No territorial infarction, hemorrhage or intracranial mass. Small focal hypodensity in the right basal ganglia. Atrophy with moderate small vessel ischemic changes of the white matter. Mildly prominent ventricles felt secondary to atrophy Vascular: No hyperdense vessels.  Carotid vascular calcification Skull: Normal. Negative for fracture or focal lesion. Sinuses/Orbits: No acute finding. Other: None IMPRESSION: 1. Small focal hypodensity in the right basal ganglia may reflect age indeterminate lacunar infarct. Negative for hemorrhage. 2. Atrophy and small vessel ischemic changes of the white matter. Electronically Signed   By: Donavan Foil M.D.   On: 08/01/2018 23:20    EKG: Independently reviewed. Sinus rhythm, non-specific repolarization abnormality.   Assessment/Plan  1. Acute colitis  - Presents with ~3 days of lethargy, abdominal pain, and increased confusion  - Found to have  tenderness of in right lower and mid-abdomen with CT findings suggestive of ascending colitis  - She was started on Cipro and Flagyl in ED  - Continue antibiotics, supportive care    2. Acute encephalopathy; ?right basal ganglia infarct - Presents with ~3 days of lethargy, increased confusion, and abdominal pain  - CT head concerning for possible age-indeterminate right basal ganglia infarct  - Check MRI brain, continue supportive care    3. Hyponatremia  - Serum sodium is 133 on admission  - Family reports she has been drinking a lot of water for a couple days, peripheral edema noted   - Restrict free-water, repeat chem panel in am    4. Hypokalemia  - Serum potassium is 2.9 in ED  - Treated with 20 mEq IV potassium and 20 mEq oral potassium  - Continue cardiac monitoring, repeat chem panel in am   5. Dementia  - Continue Namenda and Aricept as tolerated    6. Rheumatoid arthritis  - Mainly involving hands, managed with methotrexate    DVT prophylaxis: Lovenox  Code Status: Full  Family Communication: Daughter updated at bedside Consults called: None Admission status: Observation     Vianne Bulls, MD [facility] Pager [phone]  If 7PM-7AM, please contact night-coverage [email or web address] Password [email or web address]  08/02/2018, 12:05 AM

## 2018-08-02 NOTE — Progress Notes (Signed)
08/01/2018  8:59 PM  08/02/2018 11:24 AM  [name] was seen and examined.  The H&P by the admitting provider, orders, imaging was reviewed.  Please see new orders.  MRI suggesting cardioembolic stroke with multiple areas of punctate infarcts.  No neurology coverage at this hospital until Tuesday 12/3.  Will transfer patient to [facility] for inpatient neurology evaluation and further work up.    MRI brain  IMPRESSION: 1. Multiple foci of scattered punctate infarcts in bilateral cerebral and cerebellar hemispheres. This suggests a central cardiac source. Question arrhythmia. EKG yesterday demonstrated normal sinus rhythm. Aortic valve disease or atherosclerotic changes in the ascending aorta should also be considered. 2. Multifocal areas of susceptibility, predominantly within the posterior circulation. This consistent with previous punctate hemorrhage and suggests an underlying vasculitis or significant atherosclerotic small vessel disease. 3. Advanced atrophy and moderate white matter disease reflects the sequela of chronic microvascular ischemia.  Murvin Natal, MD [facility]

## 2018-08-02 NOTE — Progress Notes (Signed)
Patient arrived via transport from [facility], VSS, NAD noted, MD notified.

## 2018-08-02 NOTE — Evaluation (Signed)
Physical Therapy Evaluation Patient Details Name: [name] MRN: [id number] DOB: 03-10-1941 Today's Date: 08/02/2018   History of Present Illness  Patient is a 77 year old female admitted 08/01/2018 with diagnosis acute colitis with MRI suggesting cardioembolic stroke with multiple areas of punctate infarcts. PMH: dementia, HTN, rheumatoid arthritis, history of falling.    Clinical Impression  Patient very apprehensive for weight bearing activities. Daughter present during session and assisted with toileting. Patient requires assistance for bed mobility, transfers and ambulation with RW. Patient is unsteady and requires multimodal cues to placement of hands and sequencing of steps during transfers and ambulation with RW. Patient could have 24/7 assistance from family at home but may require more assistance than they are able to consistently provide during her recovery. Diagnostic testing continues to determine etiology of current deficits.  Patient would continue to benefit from skilled physical therapy in current environment and next venue to continue return to prior function and increase strength, endurance, balance, coordination, and functional mobility and gait skills.      Follow Up Recommendations SNF;Supervision/Assistance - 24 hour    Equipment Recommendations  None recommended by PT    Recommendations for Other Services       Precautions / Restrictions Precautions Precautions: Fall Restrictions Weight Bearing Restrictions: No      Mobility  Bed Mobility Overal bed mobility: Needs Assistance Bed Mobility: Sit to Supine       Sit to supine: Mod assist   General bed mobility comments: assist for LEs  Transfers Overall transfer level: Needs assistance Equipment used: Rolling walker (2 wheeled) Transfers: Sit to/from [facility] Sit to Stand: Min assist Stand pivot transfers: Min assist       General transfer comment: multimodal cues for  placmement of hands and sequencing of steps; pt apprehensive  Ambulation/Gait Ambulation/Gait assistance: Min assist Gait Distance (Feet): 2 Feet Assistive device: Rolling walker (2 wheeled) Gait Pattern/deviations: Step-to pattern;Decreased step length - right;Decreased step length - left;Decreased stride length;Shuffle;Narrow base of support Gait velocity: decreased   General Gait Details: multimodal cues for placmement of hands and sequencing of steps; pt very apprehensive to ambulate  Stairs            Wheelchair Mobility    Modified Rankin (Stroke Patients Only)       Balance Overall balance assessment: Needs assistance Sitting-balance support: Bilateral upper extremity supported;Feet supported Sitting balance-Leahy Scale: Fair     Standing balance support: Bilateral upper extremity supported;During functional activity Standing balance-Leahy Scale: Poor                               Pertinent Vitals/Pain Pain Assessment: No/denies pain    Home Living Family/patient expects to be discharged to:: Private residence Living Arrangements: Children Available Help at Discharge: Family;Available 24 hours/day Type of Home: House Home Access: Stairs to enter Entrance Stairs-Rails: Right;Left;Can reach both Entrance Stairs-Number of Steps: 3 Home Layout: One level Home Equipment: Walker - 2 wheels;Bedside commode;Shower seat;Grab bars - tub/shower;Cane - quad;Cane - single point Additional Comments: Typically takes a sponge bath    Prior Function Level of Independence: Independent with assistive device(s);Needs assistance         Comments: Pt used RW for ambulation PTA. Periodically would get help with home cleaning.     Hand Dominance   Dominant Hand: Right    Extremity/Trunk Assessment   Upper Extremity Assessment Upper Extremity Assessment: Generalized weakness  Lower Extremity Assessment Lower Extremity Assessment: Generalized  weakness       Communication   Communication: No difficulties  Cognition Arousal/Alertness: Awake/alert;Lethargic Behavior During Therapy: WFL for tasks assessed/performed Overall Cognitive Status: Impaired/Different from baseline                                        General Comments      Exercises     Assessment/Plan    PT Assessment Patient needs continued PT services  PT Problem List Decreased strength;Decreased mobility;Decreased coordination;Decreased activity tolerance;Decreased balance;Decreased cognition;Decreased knowledge of use of DME       PT Treatment Interventions DME instruction;Therapeutic activities;Gait training;Therapeutic exercise;Patient/family education;Balance training;Neuromuscular re-education    PT Goals (Current goals can be found in the Care Plan section)  Acute Rehab PT Goals Patient Stated Goal: feel better PT Goal Formulation: With patient/family Time For Goal Achievement: 08/16/18 Potential to Achieve Goals: Fair    Frequency Min 2X/week   Barriers to discharge        Co-evaluation               AM-PAC PT "6 Clicks" Mobility  Outcome Measure Help needed turning from your back to your side while in a flat bed without using bedrails?: A Little Help needed moving from lying on your back to sitting on the side of a flat bed without using bedrails?: A Little Help needed moving to and from a bed to a chair (including a wheelchair)?: A Lot Help needed standing up from a chair using your arms (e.g., wheelchair or bedside chair)?: A Lot Help needed to walk in hospital room?: A Lot Help needed climbing 3-5 steps with a railing? : A Lot 6 Click Score: 14    End of Session Equipment Utilized During Treatment: Gait belt Activity Tolerance: Patient tolerated treatment well;Treatment limited secondary to agitation Patient left: in bed;with bed alarm set;with family/visitor present;with call bell/phone within reach Nurse  Communication: Mobility status PT Visit Diagnosis: Unsteadiness on feet (R26.81);Other abnormalities of gait and mobility (R26.89);Muscle weakness (generalized) (M62.81)    Time: 1410-1440 PT Time Calculation (min) (ACUTE ONLY): 30 min   Charges:   PT Evaluation $PT Eval Low Complexity: 1 Low PT Treatments $Therapeutic Activity: 8-22 mins      Floria Raveling. Hartnett-Rands, MS, PT [facility] [place] #[id number] 08/02/2018, 2:46 PM

## 2018-08-02 NOTE — Consult Note (Signed)
Requesting Physician: Dr. Myna Hidalgo    Chief Complaint: AMS  History obtained from: Patient and Chart     HPI:                                                                                                                                       [name] is an 77 y.o. female with past medical history of dementia, hypertension, hypothyroidism, neuropathy, rheumatoid arthritis knitted at [facility] for altered mental status, colitis and hypokalemia.    According to the patient's daughter, the patient was feeling more weak than normal and more confused for the last 3 to 4 days but was much worse yesterday.  She was taken to any [facility] and diagnosed to have colitis.  CT head was performed which showed a age-indeterminate basal ganglia infarct. MRI brain obtained showed small multiple punctate infarcts in bilateral hemispheres and patient was transferred to [facility] for further stroke work-up.    At [facility], patient underwent an echocardiogram which showed a mildly dilated left atrium but otherwise unremarkable with the EF of 60 to 65%.  She is not on aspirin at home.  Has a history of GI bleeding in the past as well as recent bout of hemoptysis 2 months ago.  Date last known well: Approximately 3 to 4 days ago tPA Given: No, outside window NIHSS:  Baseline MRS 0     Past Medical History:  Diagnosis Date  . Anxiety   . Arm fracture, left   . Breast cancer ([place])   . Bulging disc   . Cardioembolic stroke ([place]) 60/73/7106  . Confusion   . Depression   . Difficulty swallowing solids   . GERD (gastroesophageal reflux disease)   . Hypertension   . Hypothyroidism   . Invasive ductal carcinoma of left breast ([place]) 02/24/2014  . Neuropathy   . Osteoporosis   . 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 Rotator cuff disorder, right   . Scoliosis   . Shortness of breath dyspnea    "I have reactive airway disease".    Past Surgical History:  Procedure Laterality Date  .  ABDOMINAL HYSTERECTOMY    . Barbie Banner OSTEOTOMY Right 10/28/2014   Procedure: Barbie Banner OSTEOTOMY RIGHT FOOT;  Surgeon: Marcheta Grammes, DPM;  Location: [facility] ORS;  Service: Podiatry;  Laterality: Right;  . BIOPSY  05/15/2018   Procedure: BIOPSY;  Surgeon: Rogene Houston, MD;  Location: [facility] SU[facility];  Service: Endoscopy;;  gastric  . BONE BIOPSY  09/29/2015   Procedure: BONE BIOPSY AND BONE CULTURE SECOND TOE RIGHT FOOT;  Surgeon: Caprice Beaver, DPM;  Location: [facility];  Service: Podiatry;;  . BREAST SURGERY Left    lumpectomy and axillary lymph node with re-excision 2 weeks later   . BUNIONECTOMY Right 10/28/2014   Procedure: SILVER BUNIONECTOMY RIGHT FOOT;  Surgeon: Marcheta Grammes, DPM;  Location: [facility]  ORS;  Service: Podiatry;  Laterality: Right;  . CATARACT EXTRACTION W/PHACO Left 08/10/2017   Procedure: CATARACT EXTRACTION PHACO AND INTRAOCULAR LENS PLACEMENT LEFT EYE;  Surgeon: Baruch Goldmann, MD;  Location: [facility];  Service: Ophthalmology;  Laterality: Left;  CDE: 13.81  . CATARACT EXTRACTION W/PHACO Right 09/07/2017   Procedure: CATARACT EXTRACTION PHACO AND INTRAOCULAR LENS PLACEMENT RIGHT EYE;  Surgeon: Baruch Goldmann, MD;  Location: [facility];  Service: Ophthalmology;  Laterality: Right;  right  . COLONOSCOPY    . DILATION AND CURETTAGE OF UTERUS    . ESOPHAGEAL DILATION N/A 05/15/2018   Procedure: ESOPHAGEAL DILATION;  Surgeon: Rogene Houston, MD;  Location: [facility];  Service: Endoscopy;  Laterality: N/A;  . esophageal stricture    . ESOPHAGOGASTRODUODENOSCOPY N/A 05/15/2018   Procedure: ESOPHAGOGASTRODUODENOSCOPY (EGD);  Surgeon: Rogene Houston, MD;  Location: [facility];  Service: Endoscopy;  Laterality: N/A;  1:45  . HAMMER TOE SURGERY Right 10/28/2014   Procedure: HAMMER TOE CORRECTION 2ND AND 3RD TOE RIGHT FOOT;  Surgeon: Marcheta Grammes, DPM;  Location: [facility];  Service: Podiatry;  Laterality: Right;  . LUMBAR LAMINECTOMY/DECOMPRESSION MICRODISCECTOMY Left  03/13/2016   Procedure: Left Lumbar Four-Five, Lumbar Five-Sacral One Laminotomy/Foraminotomy;  Surgeon: Kevan Ny Ditty, MD;  Location: [facility];  Service: Neurosurgery;  Laterality: Left;  . REMOVAL OF IMPLANT Right 09/29/2015   Procedure: REMOVAL OF IMPLANT 2ND TOE RIGHT FOOT;  Surgeon: Caprice Beaver, DPM;  Location: [facility];  Service: Podiatry;  Laterality: Right;    Family History  Problem Relation Age of Onset  . Hypertension Mother   . Cancer Maternal Grandmother   . Cancer Cousin    Social History:  reports that she has never smoked. She has never used smokeless tobacco. She reports that she does not drink alcohol or use drugs.  Allergies:  Allergies  Allergen Reactions  . Celecoxib Swelling and Other (See Comments)  . Ibuprofen Swelling and Other (See Comments)  . Diphenhydramine Rash and Other (See Comments)    Wide awake.   Micheline Maze [Propoxyphene N-Acetaminophen] Nausea And Vomiting    Medications:                                                                                                                      I reviewed home medications   ROS:  14 systems reviewed and negative except above    Examination:                                                                                                      General: Appears well-developed  Psych: Affect appropriate to situation Eyes: No scleral injection HENT: No OP obstrucion Head: Normocephalic.  Cardiovascular: Normal rate and regular rhythm.  Respiratory: Effort normal and breath sounds normal to anterior ascultation GI: Soft.  No distension. There is no tenderness.  Skin: WDI    Neurological Examination Mental Status: Alert, oriented, thought content appropriate.  Speech fluent without evidence of aphasia. Able to follow 3 step commands without  difficulty. Cranial Nerves: II: Visual fields grossly normal,  III,IV, VI: ptosis not present, extra-ocular motions intact bilaterally, pupils equal, round, reactive to light and accommodation V,VII: smile symmetric, facial light touch sensation normal bilaterally VIII: hearing normal bilaterally IX,X: uvula rises symmetrically XI: bilateral shoulder shrug XII: midline tongue extension Motor: Right : Upper extremity   5/5    Left:     Upper extremity   5/5  Lower extremity   5/5     Lower extremity   5/5 Tone and bulk:normal tone throughout; no atrophy noted Sensory: Pinprick and light touch intact throughout, bilaterally Deep Tendon Reflexes: 2+ and symmetric throughout Plantars: Right: downgoing   Left: downgoing Cerebellar: normal finger-to-nose, normal rapid alternating movements and normal heel-to-shin test Gait: normal gait and station     Lab Results: Basic Metabolic Panel: Recent Labs  Lab 08/01/18 November 29, 2125 08/02/18 0506  NA 133* 132*  K 2.9* 3.0*  CL 96* 101  CO2 28 25  GLUCOSE 118* 98  BUN 10 9  CREATININE 0.81 0.68  CALCIUM 9.2 8.7*  MG  --  1.7    CBC: Recent Labs  Lab 08/01/18 November 29, 2125 08/02/18 0506  WBC 5.9 5.4  NEUTROABS 4.3 3.8  HGB 11.3* 10.5*  HCT 35.6* 33.3*  MCV 95.2 96.2  PLT 142* 142*    Coagulation Studies: Recent Labs    08/01/18 11-29-25  LABPROT 13.5  INR 1.04    Imaging: Ct Abdomen Pelvis Wo Contrast  Result Date: 08/01/2018 CLINICAL DATA:  Initial evaluation for acute generalized abdominal pain. EXAM: CT ABDOMEN AND PELVIS WITHOUT CONTRAST TECHNIQUE: Multidetector CT imaging of the abdomen and pelvis was performed following the standard protocol without IV contrast. COMPARISON:  None available. FINDINGS: Lower chest: Mild scattered atelectatic changes present within the visualized lung bases, left greater than right. Visualized lungs are otherwise grossly clear. Scattered coronary artery calcifications partially visualized.  Hepatobiliary: Limited noncontrast evaluation liver unremarkable. Gallbladder contracted without acute abnormality. No biliary dilatation. Pancreas: Pancreas within normal limits. Spleen: Spleen within normal limits. Adrenals/Urinary Tract: Adrenal glands unremarkable. Left kidney slightly atrophic as compared to the right. No nephrolithiasis or hydronephrosis. 14 mm cyst present within the interpolar right kidney. No appreciable radiopaque calculi seen along the course of either ureter. No hydroureter. Partially distended bladder without acute abnormality. Small right-sided bladder diverticulum noted. Stomach/Bowel: Large hiatal hernia. Stomach otherwise unremarkable. No evidence for bowel obstruction. Hazy  inflammatory stranding with wall thickening about the proximal ascending colon within the right abdomen, suspicious for possible acute colitis (series 2, image 43). No other acute inflammatory changes seen about the bowels. Appendix within normal limits. Few scattered colonic diverticula noted. Vascular/Lymphatic: Tortuosity the intraabdominal aorta noted. Mild aortic atherosclerosis. No appreciable aneurysm. No intra-abdominal or pelvic adenopathy identified on this noncontrast examination. Reproductive: Uterus is absent. Normal right ovary. Left ovary not seen. Other: No free air or fluid. Musculoskeletal: Severe lumbar levoscoliosis with apex at L2-3. Associated severe multilevel degenerative spondylolysis with facet arthrosis. Chronic compression deformities noted at T12 and L1. No acute osseous abnormality. No discrete lytic or blastic osseous lesions. IMPRESSION: 1. Hazy inflammatory stranding with wall thickening about the proximal ascending colon, suspicious for acute colitis. No complication identified. 2. No other acute intra-abdominal or pelvic process. 3. Large hiatal hernia. 4. Mild sigmoid diverticulosis without evidence for acute diverticulitis. 5. Severe lumbar levoscoliosis with advanced  multilevel degenerative spondylolysis and facet arthrosis. Electronically Signed   By: Jeannine Boga M.D.   On: 08/01/2018 23:30   Dg Chest 1 View  Result Date: 08/01/2018 CLINICAL DATA:  Altered mental status. EXAM: CHEST  1 VIEW COMPARISON:  Radiographs 07/13/2009 FINDINGS: Upper normal heart size. Moderate retrocardiac hiatal hernia. Pulmonary vasculature is normal. No consolidation, pleural effusion, or pneumothorax. No acute osseous abnormalities are seen. IMPRESSION: 1. No acute pulmonary process. 2. Moderate hiatal hernia. Electronically Signed   By: Keith Rake M.D.   On: 08/01/2018 22:50   Ct Head Wo Contrast  Result Date: 08/01/2018 CLINICAL DATA:  Confusion EXAM: CT HEAD WITHOUT CONTRAST TECHNIQUE: Contiguous axial images were obtained from the base of the skull through the vertex without intravenous contrast. COMPARISON:  None. FINDINGS: Brain: No territorial infarction, hemorrhage or intracranial mass. Small focal hypodensity in the right basal ganglia. Atrophy with moderate small vessel ischemic changes of the white matter. Mildly prominent ventricles felt secondary to atrophy Vascular: No hyperdense vessels.  Carotid vascular calcification Skull: Normal. Negative for fracture or focal lesion. Sinuses/Orbits: No acute finding. Other: None IMPRESSION: 1. Small focal hypodensity in the right basal ganglia may reflect age indeterminate lacunar infarct. Negative for hemorrhage. 2. Atrophy and small vessel ischemic changes of the white matter. Electronically Signed   By: Donavan Foil M.D.   On: 08/01/2018 23:20   Mr Brain Wo Contrast  Result Date: 08/02/2018 CLINICAL DATA:  Personal history of dementia with increased confusion over the last 3 days. Abnormal CT scan. EXAM: MRI HEAD WITHOUT CONTRAST TECHNIQUE: Multiplanar, multiecho pulse sequences of the brain and surrounding structures were obtained without intravenous contrast. COMPARISON:  CT head without contrast same day.  FINDINGS: Brain: 15-18 punctate foci of restricted diffusion are scattered throughout both hemispheres. There is involvement of the right hippocampus, within the posterior circulation. No acceptable lesions are evident. At least 1 punctate focus of restricted diffusion is present posteriorly in each cerebellar hemisphere. Additional more remote lacunar infarcts are present in the inferior cerebellum bilaterally. Susceptibility weighted imaging demonstrates multiple foci susceptibility artifact in the cerebellum and posterior circulation in particular. There is more minimal susceptibility abnormality within the anterior territories. Acute hemorrhage is present. There is no mass lesion. The ventricles are of normal size. Periventricular white matter changes are evident bilaterally. Vascular: Flow is present in the major intracranial arteries. Skull and upper cervical spine: Skull base is within normal limits. The craniocervical junction is normal. Sinuses/Orbits: Polyp or mucous retention cyst is present inferiorly in the left maxillary sinus. Anterior  ethmoid mucosal thickening is worse on the right. No fluid levels are present. There is some fluid in left mastoid air cells. No obstructing nasopharyngeal lesion is present. IMPRESSION: 1. Multiple foci of scattered punctate infarcts in bilateral cerebral and cerebellar hemispheres. This suggests a central cardiac source. Question arrhythmia. EKG yesterday demonstrated normal sinus rhythm. Aortic valve disease or atherosclerotic changes in the ascending aorta should also be considered. 2. Multifocal areas of susceptibility, predominantly within the posterior circulation. This consistent with previous punctate hemorrhage and suggests an underlying vasculitis or significant atherosclerotic small vessel disease. 3. Advanced atrophy and moderate white matter disease reflects the sequela of chronic microvascular ischemia. Electronically Signed   By: San Morelle  M.D.   On: 08/02/2018 09:01     ASSESSMENT AND PLAN  77 y.o. female with past medical history of dementia, hypertension, hypothyroidism, neuropathy, rheumatoid arthritis knitted at [facility] for altered mental status, colitis and hypokalemia.     MRI shows small punctate infarcts or bilateral hemispheres.  I do think that this is not causing patient's altered mental status and likely combination from patient's dementia and metabolic/infectious conditions.  Acute Ischemic Stroke   Risk factors:  Etiology: ESUS (embolic stroke of undetermined source) possible septic emboli   Recommend # MRI of the brain without contrast ( completed) #Transthoracic Echo (completed)  # carotid doppler  # 30 day loop monitor on discharge # ASA 325mg  daily, will not start dual antiplatelets due to history of GI bleeding) #Start or continue Atorvastatin 40 mg/other high intensity statin # BP goal: permissive HTN upto 220/120 mmHg ( 185/110 if patient has CHF, CKD) # HBAIC and Lipid profile # Telemetry monitoring # Frequent neuro checks # NPO until passes stroke swallow screen # Blood cultures  Please page stroke NP  Or  PA  Or MD from 8am -4 pm  as this patient from this time will be  followed by the stroke.   You can look them up on [email or web address]  Password [facility]   Erby Sanderson [facility] Pager Number [phone]

## 2018-08-02 NOTE — Progress Notes (Signed)
*  PRELIMINARY RESULTS* Echocardiogram 2D Echocardiogram has been performed.  Samuel Germany 08/02/2018, 12:05 PM

## 2018-08-02 NOTE — Plan of Care (Signed)
  Problem: Acute Rehab PT Goals(only PT should resolve) Goal: Pt will Roll Supine to Side Outcome: Progressing Flowsheets (Taken 08/02/2018 1451) Pt will Roll Supine to Side: with min assist Goal: Pt Will Go Supine/Side To Sit Outcome: Progressing Flowsheets (Taken 08/02/2018 1451) Pt will go Supine/Side to Sit: with minimal assist Goal: Pt Will Go Sit To Supine/Side Outcome: Progressing Flowsheets (Taken 08/02/2018 1451) Pt will go Sit to Supine/Side: with minimal assist Goal: Patient Will Transfer Sit To/From Stand Outcome: Progressing Flowsheets (Taken 08/02/2018 1451) Patient will transfer sit to/from stand: with minimal assist Goal: Pt Will Transfer Bed To Chair/Chair To Bed Outcome: Progressing Flowsheets (Taken 08/02/2018 1451) Pt will Transfer Bed to Chair/Chair to Bed: with min assist Goal: Pt Will Ambulate Outcome: Progressing Flowsheets (Taken 08/02/2018 1451) Pt will Ambulate: 10 feet; with minimal assist; with least restrictive assistive device Goal: Pt/caregiver will Perform Home Exercise Program Outcome: Progressing Flowsheets (Taken 08/02/2018 1451) Pt/caregiver will Perform Home Exercise Program: For increased strengthening; For improved balance; Independently; With minimal assist   Floria Raveling. Hartnett-Rands, MS, PT Per [place] [phone] 08/02/2018

## 2018-08-03 LAB — LIPID PANEL
Cholesterol: 99 mg/dL (ref 0–200)
HDL: 58 mg/dL (ref >40)
LDL Cholesterol: 33 mg/dL (ref 0–99)
Total CHOL/HDL Ratio: 1.7 RATIO
Triglycerides: 40 mg/dL (ref <150)
VLDL: 8 mg/dL (ref 0–40)

## 2018-08-03 LAB — COMPREHENSIVE METABOLIC PANEL
ALT: 19 U/L (ref 0–44)
AST: 30 U/L (ref 15–41)
Albumin: 2.5 g/dL — ABNORMAL LOW (ref 3.5–5.0)
Alkaline Phosphatase: 68 U/L (ref 38–126)
Anion gap: 8 (ref 5–15)
BUN: 6 mg/dL — ABNORMAL LOW (ref 8–23)
CO2: 22 mmol/L (ref 22–32)
Calcium: 8.2 mg/dL — ABNORMAL LOW (ref 8.9–10.3)
Chloride: 103 mmol/L (ref 98–111)
Creatinine, Ser: 0.91 mg/dL (ref 0.44–1.00)
GFR calc Af Amer: >60 mL/min (ref >60)
GFR calc non Af Amer: >60 mL/min (ref >60)
GLUCOSE: 83 mg/dL (ref 70–99)
Potassium: 3.4 mmol/L — ABNORMAL LOW (ref 3.5–5.1)
SODIUM: 133 mmol/L — AB (ref 135–145)
Total Bilirubin: 0.9 mg/dL (ref 0.3–1.2)
Total Protein: 5.3 g/dL — ABNORMAL LOW (ref 6.5–8.1)

## 2018-08-03 LAB — CBC
HCT: 32 % — ABNORMAL LOW (ref 36.0–46.0)
Hemoglobin: 10.3 g/dL — ABNORMAL LOW (ref 12.0–15.0)
MCH: 30.5 pg (ref 26.0–34.0)
MCHC: 32.2 g/dL (ref 30.0–36.0)
MCV: 94.7 fL (ref 80.0–100.0)
Platelets: 128 10*3/uL — ABNORMAL LOW (ref 150–400)
RBC: 3.38 MIL/uL — ABNORMAL LOW (ref 3.87–5.11)
RDW: 17.2 % — ABNORMAL HIGH (ref 11.5–15.5)
WBC: 4.4 10*3/uL (ref 4.0–10.5)
nRBC: 0 % (ref 0.0–0.2)

## 2018-08-03 LAB — URINE CULTURE: CULTURE: NO GROWTH

## 2018-08-03 MED ORDER — POTASSIUM CHLORIDE CRYS ER 20 MEQ PO TBCR
40.0000 meq | EXTENDED_RELEASE_TABLET | Freq: Once | ORAL | Status: AC
  Administered 2018-08-03: 40 meq via ORAL
  Filled 2018-08-03: qty 2

## 2018-08-03 MED ORDER — SODIUM CHLORIDE 0.9 % IV SOLN
INTRAVENOUS | Status: DC
  Administered 2018-08-03: 11:00:00 via INTRAVENOUS

## 2018-08-03 MED ORDER — CLOPIDOGREL BISULFATE 75 MG PO TABS
75.0000 mg | ORAL_TABLET | Freq: Every day | ORAL | Status: DC
  Administered 2018-08-03 – 2018-08-06 (×3): 75 mg via ORAL
  Filled 2018-08-03 (×4): qty 1

## 2018-08-03 NOTE — Progress Notes (Signed)
PROGRESS NOTE    [name]  SAY:[id number] DOB: March 10, 1941 DOA: 08/01/2018 PCP: Celene Squibb, MD   Brief Narrative: Patient is a 77 year old female with past medical history of dementia, hypertension, rheumatoid arthritis who presented to the emergency department for the evaluation of 3-day history of lethargy, increased confusion and abdominal pain.  CT of the abdomen/pelvis revealed hazy inflammatory stranding around the proximal ascending colon with wall thickening suspicious for acute colitis.  MRI was also done for ongoing lethargy and confusion which revealed acute ischemic stroke.  Currently on antibiotics.  Neurology also following.  PT recommends skilled nursing facility.  Social worker consulted.  Assessment & Plan:   Principal Problem:   Cardioembolic stroke [facility]) Active Problems:   Rheumatoid arthritis ([place])   Acute colitis   Hypothyroidism   Hyponatremia   Hypokalemia   Infarction of right basal ganglia (H[facility])   Hypertension   Dementia ([place])   Acute cardioembolic stroke ([place])   Acute ischemic stroke: Most likely cardioembolic on etiology.  Neurology following.  MRI showed Multiple foci of scattered punctate infarcts in bilateral cerebral and cerebellar hemispheres. Echocardiogram showed ejection fraction of 66-65%, no wall motion abnormality. She needs a 30 Day Loop monitoring on discharge.  Will notify cardiology. Start on aspirin 325 mg daily because of history of GI bleed. LDL of 33.  HbA1c of  5. Pending carotid doppler  Acute encephalopathy: Secondary to stroke,colitis,dementia.  Mental status improving.  Acute colitis: Presented with 3-day history of lethargy, abdominal pain.  Also found to have tenderness in the right lower and mid abdomen on presentation.  CT findings suggestive of ascending colitis.  Started on Cipro and Flagyl.  Abdominal pain is improved.  Hyponatremia: Improved  Hypokalemia: Supplemented with potassium.  Dementia: Continue  Namenda and Aricept  Rheumatoid Arthritis: On methotrexate at home.  Weakness/deconditioning: PT evaluated the patient and recommended skilled nursing facility on discharge.  Social worker consulted.   DVT prophylaxis: Lovenox Code Status: Full Family Communication: daughter at the bed side Disposition Plan: SNF after full workup   Consultants: Neurology  Procedures:None  Antimicrobials: Cipro and Flagyl  Subjective: Patient seen and examined the bedside.  Hemodynamically stable.  Abdominal pain has improved but she is still complaining of mild abdominal pain.  No bowel movement today.  Objective: Vitals:   08/03/18 0420 08/03/18 0426 08/03/18 0620 08/03/18 0827  BP: 134/62  (!) 165/92   Pulse: 75 73 68   Resp:      Temp: 98.9 F (37.2 C)  98.8 F (37.1 C) (!) 97.4 F (36.3 C)  TempSrc: Oral  Oral Axillary  SpO2:  97% 96%   Weight:      Height:        Intake/Output Summary (Last 24 hours) at November 27, 202019 1038 Last data filed at 08/02/2018 1523 Gross per 24 hour  Intake 1824.61 ml  Output -  Net 1824.61 ml   Filed Weights   08/01/18 2054 08/02/18 0102  Weight: 57.1 kg 59 kg    Examination:  General exam: Appears calm and comfortable ,Not in distress,average built,weak HEENT:PERRL,Oral mucosa moist, Ear/Nose normal on gross exam Respiratory system: Bilateral equal air entry, normal vesicular breath sounds, no wheezes or crackles  Cardiovascular system: S1 & S2 heard, RRR. No JVD, murmurs, rubs, gallops or clicks. No pedal edema. Gastrointestinal system: Abdomen is nondistended, soft and nontender. No organomegaly or masses felt. Normal bowel sounds heard. Central nervous system: Alert and oriented. No focal neurological deficits. Extremities: No edema, no clubbing ,  no cyanosis, distal peripheral pulses palpable. Skin: No rashes, lesions or ulcers,no icterus ,no pallor MSK: Normal muscle bulk,tone ,power     Data Reviewed: I have personally reviewed  following labs and imaging studies  CBC: Recent Labs  Lab 08/01/18 2127 08/02/18 0506 08/03/18 0503  WBC 5.9 5.4 4.4  NEUTROABS 4.3 3.8  --   HGB 11.3* 10.5* 10.3*  HCT 35.6* 33.3* 32.0*  MCV 95.2 96.2 94.7  PLT 142* 142* 213*   Basic Metabolic Panel: Recent Labs  Lab 08/01/18 2127 08/02/18 0506 08/03/18 0503  NA 133* 132* 133*  K 2.9* 3.0* 3.4*  CL 96* 101 103  CO2 28 25 22   GLUCOSE 118* 98 83  BUN 10 9 6*  CREATININE 0.81 0.68 0.91  CALCIUM 9.2 8.7* 8.2*  MG  --  1.7  --    GFR: Estimated Creatinine Clearance: 39.2 mL/min (by C-G formula based on SCr of 0.91 mg/dL). Liver Function Tests: Recent Labs  Lab 08/01/18 2127 08/03/18 0503  AST 39 30  ALT 23 19  ALKPHOS 92 68  BILITOT 0.8 0.9  PROT 6.5 5.3*  ALBUMIN 3.2* 2.5*   Recent Labs  Lab 08/01/18 2127  LIPASE 38   No results for input(s): AMMONIA in the last 168 hours. Coagulation Profile: Recent Labs  Lab 08/01/18 2127  INR 1.04   Cardiac Enzymes: Recent Labs  Lab 08/01/18 2127  TROPONINI <0.03   BNP (last 3 results) No results for input(s): PROBNP in the last 8760 hours. HbA1C: Recent Labs    08/01/18 2128  HGBA1C 5.0   CBG: No results for input(s): GLUCAP in the last 168 hours. Lipid Profile: Recent Labs    08/03/18 0503  CHOL 99  HDL 58  LDLCALC 33  TRIG 40  CHOLHDL 1.7   Thyroid Function Tests: No results for input(s): TSH, T4TOTAL, FREET4, T3FREE, THYROIDAB in the last 72 hours. Anemia Panel: No results for input(s): VITAMINB12, FOLATE, FERRITIN, TIBC, IRON, RETICCTPCT in the last 72 hours. Sepsis Labs: Recent Labs  Lab 08/01/18 2127 08/02/18 0002  LATICACIDVEN 2.0* 1.9    Recent Results (from the past 240 hour(s))  Urine culture     Status: None   Collection Time: 08/01/18  9:50 PM  Result Value Ref Range Status   Specimen Description   Final    URINE, RANDOM Performed at [facility], [address], [place], [place] [address]    Special Requests   Final     NONE Performed at [facility], [address] [place], [place] [address]    Culture   Final    NO GROWTH Performed at [facility], [place] [address], [place], [place] [address]    Report Status 10/09/202019 FINAL  Final         Radiology Studies: Ct Abdomen Pelvis Wo Contrast  Result Date: 08/01/2018 CLINICAL DATA:  Initial evaluation for acute generalized abdominal pain. EXAM: CT ABDOMEN AND PELVIS WITHOUT CONTRAST TECHNIQUE: Multidetector CT imaging of the abdomen and pelvis was performed following the standard protocol without IV contrast. COMPARISON:  None available. FINDINGS: Lower chest: Mild scattered atelectatic changes present within the visualized lung bases, left greater than right. Visualized lungs are otherwise grossly clear. Scattered coronary artery calcifications partially visualized. Hepatobiliary: Limited noncontrast evaluation liver unremarkable. Gallbladder contracted without acute abnormality. No biliary dilatation. Pancreas: Pancreas within normal limits. Spleen: Spleen within normal limits. Adrenals/Urinary Tract: Adrenal glands unremarkable. Left kidney slightly atrophic as compared to the right. No nephrolithiasis or hydronephrosis.  14 mm cyst present within the interpolar right kidney. No appreciable radiopaque calculi seen along the course of either ureter. No hydroureter. Partially distended bladder without acute abnormality. Small right-sided bladder diverticulum noted. Stomach/Bowel: Large hiatal hernia. Stomach otherwise unremarkable. No evidence for bowel obstruction. Hazy inflammatory stranding with wall thickening about the proximal ascending colon within the right abdomen, suspicious for possible acute colitis (series 2, image 43). No other acute inflammatory changes seen about the bowels. Appendix within normal limits. Few scattered colonic diverticula noted. Vascular/Lymphatic: Tortuosity the intraabdominal aorta noted. Mild aortic atherosclerosis. No  appreciable aneurysm. No intra-abdominal or pelvic adenopathy identified on this noncontrast examination. Reproductive: Uterus is absent. Normal right ovary. Left ovary not seen. Other: No free air or fluid. Musculoskeletal: Severe lumbar levoscoliosis with apex at L2-3. Associated severe multilevel degenerative spondylolysis with facet arthrosis. Chronic compression deformities noted at T12 and L1. No acute osseous abnormality. No discrete lytic or blastic osseous lesions. IMPRESSION: 1. Hazy inflammatory stranding with wall thickening about the proximal ascending colon, suspicious for acute colitis. No complication identified. 2. No other acute intra-abdominal or pelvic process. 3. Large hiatal hernia. 4. Mild sigmoid diverticulosis without evidence for acute diverticulitis. 5. Severe lumbar levoscoliosis with advanced multilevel degenerative spondylolysis and facet arthrosis. Electronically Signed   By: Jeannine Boga M.D.   On: 08/01/2018 23:30   Dg Chest 1 View  Result Date: 08/01/2018 CLINICAL DATA:  Altered mental status. EXAM: CHEST  1 VIEW COMPARISON:  Radiographs 07/13/2009 FINDINGS: Upper normal heart size. Moderate retrocardiac hiatal hernia. Pulmonary vasculature is normal. No consolidation, pleural effusion, or pneumothorax. No acute osseous abnormalities are seen. IMPRESSION: 1. No acute pulmonary process. 2. Moderate hiatal hernia. Electronically Signed   By: Keith Rake M.D.   On: 08/01/2018 22:50   Ct Head Wo Contrast  Result Date: 08/01/2018 CLINICAL DATA:  Confusion EXAM: CT HEAD WITHOUT CONTRAST TECHNIQUE: Contiguous axial images were obtained from the base of the skull through the vertex without intravenous contrast. COMPARISON:  None. FINDINGS: Brain: No territorial infarction, hemorrhage or intracranial mass. Small focal hypodensity in the right basal ganglia. Atrophy with moderate small vessel ischemic changes of the white matter. Mildly prominent ventricles felt  secondary to atrophy Vascular: No hyperdense vessels.  Carotid vascular calcification Skull: Normal. Negative for fracture or focal lesion. Sinuses/Orbits: No acute finding. Other: None IMPRESSION: 1. Small focal hypodensity in the right basal ganglia may reflect age indeterminate lacunar infarct. Negative for hemorrhage. 2. Atrophy and small vessel ischemic changes of the white matter. Electronically Signed   By: Donavan Foil M.D.   On: 08/01/2018 23:20   Mr Brain Wo Contrast  Result Date: 08/02/2018 CLINICAL DATA:  Personal history of dementia with increased confusion over the last 3 days. Abnormal CT scan. EXAM: MRI HEAD WITHOUT CONTRAST TECHNIQUE: Multiplanar, multiecho pulse sequences of the brain and surrounding structures were obtained without intravenous contrast. COMPARISON:  CT head without contrast same day. FINDINGS: Brain: 15-18 punctate foci of restricted diffusion are scattered throughout both hemispheres. There is involvement of the right hippocampus, within the posterior circulation. No acceptable lesions are evident. At least 1 punctate focus of restricted diffusion is present posteriorly in each cerebellar hemisphere. Additional more remote lacunar infarcts are present in the inferior cerebellum bilaterally. Susceptibility weighted imaging demonstrates multiple foci susceptibility artifact in the cerebellum and posterior circulation in particular. There is more minimal susceptibility abnormality within the anterior territories. Acute hemorrhage is present. There is no mass lesion. The ventricles are of normal size. Periventricular  white matter changes are evident bilaterally. Vascular: Flow is present in the major intracranial arteries. Skull and upper cervical spine: Skull base is within normal limits. The craniocervical junction is normal. Sinuses/Orbits: Polyp or mucous retention cyst is present inferiorly in the left maxillary sinus. Anterior ethmoid mucosal thickening is worse on the  right. No fluid levels are present. There is some fluid in left mastoid air cells. No obstructing nasopharyngeal lesion is present. IMPRESSION: 1. Multiple foci of scattered punctate infarcts in bilateral cerebral and cerebellar hemispheres. This suggests a central cardiac source. Question arrhythmia. EKG yesterday demonstrated normal sinus rhythm. Aortic valve disease or atherosclerotic changes in the ascending aorta should also be considered. 2. Multifocal areas of susceptibility, predominantly within the posterior circulation. This consistent with previous punctate hemorrhage and suggests an underlying vasculitis or significant atherosclerotic small vessel disease. 3. Advanced atrophy and moderate white matter disease reflects the sequela of chronic microvascular ischemia. Electronically Signed   By: San Morelle M.D.   On: 08/02/2018 09:01        Scheduled Meds: . donepezil  5 mg Oral QHS  . DULoxetine  60 mg Oral Daily  . enoxaparin (LOVENOX) injection  40 mg Subcutaneous Q24H  . memantine  10 mg Oral BID  . pantoprazole  40 mg Oral Daily  . potassium chloride  40 mEq Oral Once  . sodium chloride flush  3 mL Intravenous Q12H   Continuous Infusions: . sodium chloride    . ciprofloxacin 400 mg (08/02/18 2353)  . metronidazole 500 mg (08/03/18 0040)     LOS: 1 day    Time spent: 35 mins.More than 50% of that time was spent in counseling and/or coordination of care.      Shelly Coss, MD [facility] Pager [phone]  If 7PM-7AM, please contact night-coverage [email or web address] Password [email or web address] 2020/04/2418, 10:38 AM

## 2018-08-03 NOTE — Evaluation (Signed)
Occupational Therapy Evaluation Patient Details Name: [name] MRN: [id number] DOB: 03-16-1941 Today's Date: 01/15/202019    History of Present Illness Patient is a 77 year old female admitted 08/01/2018 with diagnosis acute colitis with MRI suggesting cardioembolic stroke with multiple areas of punctate infarcts. PMH: dementia, HTN, rheumatoid arthritis, history of falling.   Clinical Impression   Pt was admitted for the above. At baseline, she is mod I with adls.  She was limited by back/abdominal pain, fear of falling and delayed processing at time of evaluation. She will benefit from continue OT in acute to increase safety and independence with adls. Goals are for set up to min A levels.     Follow Up Recommendations  SNF    Equipment Recommendations  3 in 1 bedside commode    Recommendations for Other Services       Precautions / Restrictions Precautions Precautions: Fall Restrictions Weight Bearing Restrictions: No      Mobility Bed Mobility           Sit to supine: Mod assist   General bed mobility comments: rolled and pushed up to sitting from sidelying:  mod A for this and back to bed  Transfers   Equipment used: 1 person hand held assist   Sit to Stand: Min assist         General transfer comment: assist to rise and steady    Balance     Sitting balance-Leahy Scale: Fair       Standing balance-Leahy Scale: Poor                             ADL either performed or assessed with clinical judgement   ADL Overall ADL's : Needs assistance/impaired Eating/Feeding: Set up   Grooming: Oral care;Minimal assistance   Upper Body Bathing: Minimal assistance   Lower Body Bathing: Maximal assistance;Sit to/from stand   Upper Body Dressing : Minimal assistance;Sitting(lines)   Lower Body Dressing: Maximal assistance;Sit to/from stand                 General ADL Comments: sat EOB and brushed her teeth.  Sidestepped up [place]  with min A. Pt fearful of falling     Vision         Perception     Praxis      Pertinent Vitals/Pain Pain Assessment: Faces Faces Pain Scale: Hurts even more Pain Location: abdomen and back Pain Descriptors / Indicators: Discomfort Pain Intervention(s): Limited activity within patient's tolerance;Monitored during session;Repositioned     Hand Dominance Right   Extremity/Trunk Assessment Upper Extremity Assessment Upper Extremity Assessment: Generalized weakness(LUE immobilized due to IV line)           Communication Communication Communication: No difficulties   Cognition Arousal/Alertness: Awake/alert;Lethargic Behavior During Therapy: WFL for tasks assessed/performed Overall Cognitive Status: Impaired/Different from baseline                                 General Comments: h/o dementia but fearful of falling; slow processing at times,    General Comments       Exercises     Shoulder Instructions      Home Living Family/patient expects to be discharged to:: Skilled nursing facility Living Arrangements: Children  Prior Functioning/Environment Level of Independence: Independent with assistive device(s);Needs assistance        Comments: mod I for adls        OT Problem List: Decreased strength;Decreased activity tolerance;Impaired balance (sitting and/or standing);Decreased cognition;Pain;Decreased knowledge of use of DME or AE      OT Treatment/Interventions: Self-care/ADL training;DME and/or AE instruction;Patient/family education;Balance training;Cognitive remediation/compensation;Therapeutic activities    OT Goals(Current goals can be found in the care plan section) Acute Rehab OT Goals Patient Stated Goal: regain independence OT Goal Formulation: With family Time For Goal Achievement: 08/17/18 Potential to Achieve Goals: Good ADL Goals Pt Will Transfer to Toilet: with min  guard assist;ambulating;bedside commode Pt Will Perform Toileting - Clothing Manipulation and hygiene: with min assist;sit to/from stand Additional ADL Goal #1: pt will perform UB adls with set up sitting and no more than 1 vc per activity Additional ADL Goal #2: Pt will perform LB adls with min A, no more than 1 vc per activity Additional ADL Goal #3: pt will follow commands within 10 seconds of cue  OT Frequency: Min 2X/week   Barriers to D/C:            Co-evaluation              AM-PAC OT "6 Clicks" Daily Activity     Outcome Measure Help from another person eating meals?: A Little Help from another person taking care of personal grooming?: A Little Help from another person toileting, which includes using toliet, bedpan, or urinal?: A Lot Help from another person bathing (including washing, rinsing, drying)?: A Lot Help from another person to put on and taking off regular upper body clothing?: A Little Help from another person to put on and taking off regular lower body clothing?: A Lot 6 Click Score: 15   End of Session    Activity Tolerance: Patient limited by fatigue;Patient limited by pain Patient left: in bed;with call bell/phone within reach;with bed alarm set;with family/visitor present  OT Visit Diagnosis: Unsteadiness on feet (R26.81);Muscle weakness (generalized) (M62.81)                Time: 2947-6546 OT Time Calculation (min): 27 min Charges:  OT General Charges $OT Visit: 1 Visit OT Evaluation $OT Eval Low Complexity: 1 Low OT Treatments $Self Care/Home Management : 8-22 mins  [name], OTR/L Acute Rehabilitation Services [phone] [facility] pager [phone] office 2020/03/1518  Inocencio Roy 2020/03/1518, 4:12 PM

## 2018-08-03 NOTE — Progress Notes (Addendum)
STROKE TEAM PROGRESS NOTE   HISTORY OF PRESENT ILLNESS (per record) [name] is an 77 y.o. female with past medical history of dementia, hypertension, hypothyroidism, neuropathy, rheumatoid arthritis knitted at [facility] for altered mental status, colitis and hypokalemia.    According to the patient's daughter, the patient was feeling more weak than normal and more confused for the last 3 to 4 days but was much worse yesterday.  She was taken to any [facility] and diagnosed to have colitis.  CT head was performed which showed a age-indeterminate basal ganglia infarct. MRI brain obtained showed small multiple punctate infarcts in bilateral hemispheres and patient was transferred to [facility] for further stroke work-up.    At [facility], patient underwent an echocardiogram which showed a mildly dilated left atrium but otherwise unremarkable with the EF of 60 to 65%.  She is not on aspirin at home.  Has a history of GI bleeding in the past as well as recent bout of hemoptysis 2 months ago.  Date last known well: Approximately 3 to 4 days ago tPA Given: No, outside window NIHSS:  Baseline MRS 0   SUBJECTIVE (INTERVAL HISTORY) The patient's daughter was at the bedside.  She feels that the patient is doing some better today.  The patient remains confused.    OBJECTIVE Vitals:   08/03/18 0420 08/03/18 0426 08/03/18 0620 08/03/18 0827  BP: 134/62  (!) 165/92   Pulse: 75 73 68   Resp:      Temp: 98.9 F (37.2 C)  98.8 F (37.1 C) (!) 97.4 F (36.3 C)  TempSrc: Oral  Oral Axillary  SpO2:  97% 96%   Weight:      Height:        CBC:  Recent Labs  Lab 08/01/18 2127 08/02/18 0506 08/03/18 0503  WBC 5.9 5.4 4.4  NEUTROABS 4.3 3.8  --   HGB 11.3* 10.5* 10.3*  HCT 35.6* 33.3* 32.0*  MCV 95.2 96.2 94.7  PLT 142* 142* 128*    Basic Metabolic Panel:  Recent Labs  Lab 08/02/18 0506 08/03/18 0503  NA 132* 133*  K 3.0* 3.4*  CL 101 103  CO2 25 22  GLUCOSE  98 83  BUN 9 6*  CREATININE 0.68 0.91  CALCIUM 8.7* 8.2*  MG 1.7  --     Lipid Panel:     Component Value Date/Time   CHOL 99 17-Dec-202019 0503   TRIG 40 17-Dec-202019 0503   HDL 58 17-Dec-202019 0503   CHOLHDL 1.7 17-Dec-202019 0503   VLDL 8 17-Dec-202019 0503   LDLCALC 33 17-Dec-202019 0503   HgbA1c:  Lab Results  Component Value Date   HGBA1C 5.0 08/01/2018   Urine Drug Screen: No results found for: LABOPIA, COCAINSCRNUR, LABBENZ, AMPHETMU, THCU, LABBARB  Alcohol Level No results found for: [facility]  IMAGING   Ct Abdomen Pelvis Wo Contrast 08/01/2018 IMPRESSION:  1. Hazy inflammatory stranding with wall thickening about the proximal ascending colon, suspicious for acute colitis. No complication identified.  2. No other acute intra-abdominal or pelvic process.  3. Large hiatal hernia.  4. Mild sigmoid diverticulosis without evidence for acute diverticulitis.  5. Severe lumbar levoscoliosis with advanced multilevel degenerative spondylolysis and facet arthrosis.   Dg Chest 1 View 08/01/2018 IMPRESSION:  1. No acute pulmonary process.  2. Moderate hiatal hernia.    Ct Head Wo Contrast 08/01/2018 IMPRESSION:  1. Small focal hypodensity in the right basal ganglia may reflect age indeterminate lacunar infarct. Negative for  hemorrhage.  2. Atrophy and small vessel ischemic changes of the white matter.     Mr Brain Wo Contrast 08/02/2018 IMPRESSION:  1. Multiple foci of scattered punctate infarcts in bilateral cerebral and cerebellar hemispheres. This suggests a central cardiac source. Question arrhythmia. EKG yesterday demonstrated normal sinus rhythm. Aortic valve disease or atherosclerotic changes in the ascending aorta should also be considered.  2. Multifocal areas of susceptibility, predominantly within the posterior circulation. This consistent with previous punctate hemorrhage and suggests an underlying vasculitis or significant atherosclerotic small vessel disease.  3. Advanced  atrophy and moderate white matter disease reflects the sequela of chronic microvascular ischemia.    Transthoracic Echocardiogram  08/02/2018 Study Conclusions  - Left ventricle: The cavity size was normal. Wall thickness was at   the upper limits of normal. Systolic function was normal. The   estimated ejection fraction was in the range of 60% to 65%. Wall   motion was normal; there were no regional wall motion   abnormalities. Doppler parameters are consistent with abnormal   left ventricular relaxation (grade 1 diastolic dysfunction). - Aortic valve: Mildly calcified annulus. Trileaflet. There was   trivial regurgitation. - Mitral valve: Mildly calcified annulus. There was trivial   regurgitation. - Left atrium: The atrium was mildly dilated. - Right atrium: Central venous pressure (est): 3 mm Hg. - Atrial septum: No defect or patent foramen ovale was identified. - Tricuspid valve: There was trivial regurgitation. - Pulmonary arteries: PA peak pressure: 24 mm Hg (S). - Pericardium, extracardiac: A prominent pericardial fat pad was   present.    Bilateral Carotid Dopplers - pending 00/00/00     PHYSICAL EXAM Blood pressure (!) 165/92, pulse 68, temperature (!) 97.4 F (36.3 C), temperature source Axillary, resp. rate 18, height 4\' 11"  (1.499 m), weight 59 kg, SpO2 96 %.  Pleasant elderly lady currently not in distress. . Afebrile. Head is nontraumatic. Neck is supple without bruit.    Cardiac exam no murmur or gallop. Lungs are clear to auscultation. Distal pulses are well felt. Neurological Exam :  Awake alert oriented x2.  Diminished attention registration and recall.  Follows two-step commands.  Extraocular movements are full range without nystagmus.  Pupils equal reactive.  Blinks to threat bilaterally.  Face is symmetric without weakness.  Tongue midline.  Motor system exam reveals symmetric and equal strength in all 4 extremities without focal weakness.  Deep tendon  reflexes appear symmetric plantars are downgoing.  Sensation appears intact.  Gait not tested.   HOME MEDICATIONS:  Medications Prior to Admission  Medication Sig Dispense Refill  . Calcium Carbonate-Vitamin D (CALTRATE 600+D) 600-400 MG-UNIT tablet Take 1 tablet by mouth 2 (two) times daily.    . cetirizine (ZYRTEC) 10 MG tablet Take 10 mg by mouth daily as needed for allergies.     . Cholecalciferol (VITAMIN D) 2000 UNITS CAPS Take 2,000 Units by mouth daily.     Marland Kitchen donepezil (ARICEPT) 5 MG tablet Take 5 mg by mouth at bedtime.    . DULoxetine (CYMBALTA) 60 MG capsule Take 60 mg by mouth daily.    . folic acid (FOLVITE) 568 MCG tablet Take 800 mcg by mouth daily.    . Magnesium 500 MG CAPS Take 500 mg by mouth daily.     . memantine (NAMENDA) 10 MG tablet Take 10 mg by mouth 2 (two) times daily.    . methotrexate (RHEUMATREX) 2.5 MG tablet Take 22.5 mg by mouth once a week. Every Tuesday    .  OVER THE COUNTER MEDICATION Take 8 oz by mouth daily as needed (leg pain). Tonic Water with Quinine    . pantoprazole (PROTONIX) 40 MG tablet Take 1 tablet (40 mg total) by mouth daily. 90 tablet 3  . potassium chloride (K-DUR) 10 MEQ tablet Take 10 mEq by mouth at bedtime.    . Zinc 50 MG CAPS Take 50 mg by mouth daily.     Marland Kitchen albuterol (PROAIR HFA) 108 (90 Base) MCG/ACT inhaler Inhale 2 puffs into the lungs every 6 (six) hours as needed for wheezing or shortness of breath.     . denosumab (PROLIA) 60 MG/ML SOLN injection Inject 60 mg into the skin every 6 (six) months. Administer in upper arm, thigh, or abdomen, got in June 2019    . HYDROcodone-acetaminophen (NORCO/VICODIN) 5-325 MG tablet Take 1 tablet by mouth daily as needed for pain.    Marland Kitchen lisinopril (PRINIVIL,ZESTRIL) 10 MG tablet Take 5 mg by mouth daily. Son says she takes it as needed when she feels her BP is high, she will check it and then take it if needed.    . senna (EQ NATURAL VEGETABLE LAXATIVE) 8.6 MG tablet Take 1 tablet by mouth daily  as needed for constipation.        HOSPITAL MEDICATIONS:  . donepezil  5 mg Oral QHS  . DULoxetine  60 mg Oral Daily  . enoxaparin (LOVENOX) injection  40 mg Subcutaneous Q24H  . memantine  10 mg Oral BID  . pantoprazole  40 mg Oral Daily  . sodium chloride flush  3 mL Intravenous Q12H    ALLERGIES Allergies  Allergen Reactions  . Celecoxib Swelling and Other (See Comments)  . Ibuprofen Swelling and Other (See Comments)  . Diphenhydramine Rash and Other (See Comments)    Wide awake.   Micheline Maze [Propoxyphene N-Acetaminophen] Nausea And Vomiting    PAST MEDICAL HISTORY Past Medical History:  Diagnosis Date  . Anxiety   . Arm fracture, left   . Breast cancer ([place])   . Bulging disc   . Cardioembolic stroke ([place]) 36/14/4315  . Confusion   . Depression   . Difficulty swallowing solids   . GERD (gastroesophageal reflux disease)   . Hypertension   . Hypothyroidism   . Invasive ductal carcinoma of left breast ([place]) 02/24/2014  . Neuropathy   . Osteoporosis   . 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 Rotator cuff disorder, right   . Scoliosis   . Shortness of breath dyspnea    "I have reactive airway disease".    SURGICAL HISTORY Past Surgical History:  Procedure Laterality Date  . ABDOMINAL HYSTERECTOMY    . Barbie Banner OSTEOTOMY Right 10/28/2014   Procedure: Barbie Banner OSTEOTOMY RIGHT FOOT;  Surgeon: Marcheta Grammes, DPM;  Location: [facility] OR[facility];  Service: Podiatry;  Laterality: Right;  . BIOPSY  05/15/2018   Procedure: BIOPSY;  Surgeon: Rogene Houston, MD;  Location: [facility] END[facility] SU[facility];  Service: Endoscopy;;  gastric  . BONE BIOPSY  09/29/2015   Procedure: BONE BIOPSY AND BONE CULTURE SECOND TOE RIGHT FOOT;  Surgeon: Caprice Beaver, DPM;  Location: [facility];  Service: Podiatry;;  . BREAST SURGERY Left    lumpectomy and axillary lymph node with re-excision 2 weeks later   . BUNIONECTOMY Right 10/28/2014   Procedure: SILVER BUNIONECTOMY RIGHT FOOT;  Surgeon: Marcheta Grammes,  DPM;  Location: [facility];  Service: Podiatry;  Laterality: Right;  . CATARACT EXTRACTION W/PHACO Left 08/10/2017   Procedure: CATARACT  EXTRACTION PHACO AND INTRAOCULAR LENS PLACEMENT LEFT EYE;  Surgeon: Baruch Goldmann, MD;  Location: [facility];  Service: Ophthalmology;  Laterality: Left;  CDE: 13.81  . CATARACT EXTRACTION W/PHACO Right 09/07/2017   Procedure: CATARACT EXTRACTION PHACO AND INTRAOCULAR LENS PLACEMENT RIGHT EYE;  Surgeon: Baruch Goldmann, MD;  Location: [facility];  Service: Ophthalmology;  Laterality: Right;  right  . COLONOSCOPY    . DILATION AND CURETTAGE OF UTERUS    . ESOPHAGEAL DILATION N/A 05/15/2018   Procedure: ESOPHAGEAL DILATION;  Surgeon: Rogene Houston, MD;  Location: [facility];  Service: Endoscopy;  Laterality: N/A;  . esophageal stricture    . ESOPHAGOGASTRODUODENOSCOPY N/A 05/15/2018   Procedure: ESOPHAGOGASTRODUODENOSCOPY (EGD);  Surgeon: Rogene Houston, MD;  Location: [facility];  Service: Endoscopy;  Laterality: N/A;  1:45  . HAMMER TOE SURGERY Right 10/28/2014   Procedure: HAMMER TOE CORRECTION 2ND AND 3RD TOE RIGHT FOOT;  Surgeon: Marcheta Grammes, DPM;  Location: [facility];  Service: Podiatry;  Laterality: Right;  . LUMBAR LAMINECTOMY/DECOMPRESSION MICRODISCECTOMY Left 03/13/2016   Procedure: Left Lumbar Four-Five, Lumbar Five-Sacral One Laminotomy/Foraminotomy;  Surgeon: Kevan Ny Ditty, MD;  Location: [facility];  Service: Neurosurgery;  Laterality: Left;  . REMOVAL OF IMPLANT Right 09/29/2015   Procedure: REMOVAL OF IMPLANT 2ND TOE RIGHT FOOT;  Surgeon: Caprice Beaver, DPM;  Location: [facility];  Service: Podiatry;  Laterality: Right;      ASSESSMENT/PLAN Ms. [name] is a 77 y.o. female with history of dementia, hypertension, hypothyroidism, neuropathy, rheumatoid arthritis, history of GI bleeding and hemoptysis 2 months ago.  presenting with  She did not receive IV t-PA due to altered mental status, colitis and hypokalemia.  Stroke:  multiple infarcts - embolic - unknown source  Resultant confusion and altered mental status superimposed upon baseline dementia  CT head - Small focal hypodensity in the right basal ganglia may reflect age indeterminate lacunar infarct.  MRI head - Multiple foci of scattered punctate infarcts in bilateral cerebral and cerebellar hemispheres. Multifocal areas of susceptibility, predominantly within the posterior circulation. This consistent with previous punctate hemorrhage and suggests an underlying vasculitis or significant atherosclerotic small vessel disease.   MRA head - not performed.  Carotid Doppler - pending  2D Echo  - EF 60 - 65%. No cardiac source of emboli identified.   LDL - 33  HgbA1c - 5.0  UDS - not performed  VTE prophylaxis - Lovenox  Diet  - Heart healthy with thin liquids.  No antithrombotic prior to admission, now on No antithrombotic  Ongoing aggressive stroke risk factor management  Therapy recommendations:  pending  Disposition:  Pending  Hypertension  Stable . Permissive hypertension (OK if < 220/120) but gradually normalize in 5-7 days . Long-term BP goal normotensive  Hyperlipidemia  Lipid lowering medication PTA:  none  LDL 33, goal < 70  Current lipid lowering medication: none  Continue statin at discharge   Other Stroke Risk Factors  Advanced age   Other Active Problems  Blood and urine cultures  Anemia  Mild thrombocytopenia  Mild hypokalemia  Mild hyponatremia    Plan  Currently not on antiplatelet therapy - suspect septic emboli -history of GI bleed and hemoptysis  Await cultures (pt on flagyl and Cipro) afebrile  TEE on Monday no loop (rule out endocarditis)   Possible acute colitis by CT - (pt on flagyl and Cipro)   Hospital day # [facility] Pager [phone] 2020/07/1818, 4:00  PM I have personally examined this patient, reviewed notes, independently viewed  imaging studies, participated in medical decision making and plan of care.ROS completed by me personally and pertinent positives fully documented  I have made any additions or clarifications directly to the above note. Agree with note above.  She presented with generalized weakness and increased confusion in the setting of hypokalemia and colitis and MRI scan shows by cerebral embolic infarcts etiology to be determined. Start plavix for now given aspirin allergy and ongoing stroke work-up.  Long discussion with the patient and daughter at the bedside and answered questions about her care.  Will need transesophageal echocardiogram to look for cardiac vegetations and if blood cultures and TEE and negative then  prolonged cardiac monitoring to look for PAF.  Greater than 50% time during this 35-minute visit was spent on counseling and coordination of care about her embolic stroke and answering questions about her plan of care and treatment. Antony Contras, MD Medical Director [facility] Pager: [phone] 03-30-202019 4:18 PM   To contact Stroke Continuity provider, please refer to [email or web address] After hours, contact General Neurology

## 2018-08-03 NOTE — NC FL2 (Signed)
[place] LEVEL OF CARE SCREENING TOOL     IDENTIFICATION  Patient Name: [name] Birthdate: 14-Dec-1940 Sex: female Admission Date (Current Location): 08/01/2018  [facility] and [place] Number:  Herbalist and Address:  [facility], [place] [address] [place],  [address]      Provider Number: [id number]  Attending Physician Name and Address:  Shelly Coss, MD  Relative Name and Phone Number:  [name], daughter, [phone]    Current Level of Care: Hospital Recommended Level of Care: [place] Prior Approval Number:    Date Approved/Denied:   PASRR Number: pending  Discharge Plan: SNF    Current Diagnoses: Patient Active Problem List   Diagnosis Date Noted  . Cardioembolic stroke ([place]) 44/81/8563  . Acute cardioembolic stroke ([place]) 14/97/0263  . Acute colitis 08/01/2018  . Hypothyroidism 08/01/2018  . Hyponatremia 08/01/2018  . Hypokalemia 08/01/2018  . Infarction of right basal ganglia ([place]) 08/01/2018  . Hypertension 08/01/2018  . Dementia ([place]) 08/01/2018  . Melena 05/07/2018  . Esophageal dysphagia 05/07/2018  . Osteoporosis with pathological fracture of forearm 10/03/2016  . Lumbar radiculopathy 03/13/2016  . Invasive ductal carcinoma of left breast ([place]) 02/24/2014  . 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 Multiple thyroid nodules 06/03/2013  . Rheumatoid arthritis ([place]) 07/15/2008    Orientation RESPIRATION BLADDER Height & Weight     Self  Normal Continent Weight: 130 lb 1.1 oz (59 kg) Height:  4\' 11"  (149.9 cm)  BEHAVIORAL SYMPTOMS/MOOD NEUROLOGICAL BOWEL NUTRITION STATUS      Continent Diet(see discharge summary)  AMBULATORY STATUS COMMUNICATION OF NEEDS Skin   Extensive Assist Verbally Normal                       Personal Care Assistance Level of Assistance  Bathing, Feeding, Dressing Bathing Assistance: Maximum  assistance Feeding assistance: Limited assistance Dressing Assistance: Maximum assistance     Functional Limitations Info  Sight, Hearing, Speech Sight Info: Adequate Hearing Info: Adequate Speech Info: Adequate    SPECIAL CARE FACTORS FREQUENCY  PT (By licensed PT), OT (By licensed OT)     PT Frequency: 5x week OT Frequency: 5x week            Contractures Contractures Info: Not present    Additional Factors Info  Code Status, Allergies, Psychotropic Code Status Info: Full Code Allergies Info: CELECOXIB, IBUPROFEN, DIPHENHYDRAMINE, WYGESIC PROPOXYPHENE N-ACETAMINOPHEN  Psychotropic Info: donepezil (ARICEPT) tablet 5 mg daily at bedtime; DULoxetine (CYMBALTA) DR capsule 60 mg daily PO; memantine (NAMENDA) tablet 10 mg 2x daily PO         Current Medications (Dec 22, 202019):  This is the current hospital active medication list Current Facility-Administered Medications  Medication Dose Route Frequency Provider Last Rate Last Dose  . acetaminophen (TYLENOL) tablet 650 mg  650 mg Oral Q6H PRN Opyd, Ilene Qua, MD   650 mg at 08/03/18 0415   Or  . acetaminophen (TYLENOL) suppository 650 mg  650 mg Rectal Q6H PRN Opyd, Ilene Qua, MD      . albuterol (PROVENTIL) (2.5 MG/3ML) 0.083% nebulizer solution 3 mL  3 mL Inhalation Q6H PRN Opyd, Ilene Qua, MD      . ciprofloxacin (CIPRO) IVPB 400 mg  400 mg Intravenous Q12H Opyd, Ilene Qua, MD 200 mL/hr at 08/02/18 2353 400 mg at 08/02/18 2353  . donepezil (ARICEPT) tablet 5 mg  5 mg Oral QHS Opyd, Timothy S,  MD   5 mg at 08/02/18 2150  . DULoxetine (CYMBALTA) DR capsule 60 mg  60 mg Oral Daily Opyd, Ilene Qua, MD   60 mg at 08/03/18 1046  . enoxaparin (LOVENOX) injection 40 mg  40 mg Subcutaneous Q24H Opyd, Ilene Qua, MD   40 mg at 08/03/18 1045  . HYDROcodone-acetaminophen (NORCO/VICODIN) 5-325 MG per tablet 1-2 tablet  1-2 tablet Oral Q4H PRN Opyd, Ilene Qua, MD      . memantine (NAMENDA) tablet 10 mg  10 mg Oral BID Opyd, Ilene Qua, MD    10 mg at 08/03/18 1045  . metroNIDAZOLE (FLAGYL) IVPB 500 mg  500 mg Intravenous Q8H Opyd, Ilene Qua, MD 100 mL/hr at 08/03/18 0800 500 mg at 08/03/18 0800  . ondansetron (ZOFRAN) tablet 4 mg  4 mg Oral Q6H PRN Opyd, Ilene Qua, MD       Or  . ondansetron (ZOFRAN) injection 4 mg  4 mg Intravenous Q6H PRN Opyd, Ilene Qua, MD      . pantoprazole (PROTONIX) EC tablet 40 mg  40 mg Oral Daily Opyd, Ilene Qua, MD   40 mg at 08/03/18 1046  . senna-docusate (Senokot-S) tablet 1 tablet  1 tablet Oral QHS PRN Opyd, Ilene Qua, MD      . sodium chloride flush (NS) 0.9 % injection 3 mL  3 mL Intravenous Q12H Opyd, Ilene Qua, MD   3 mL at 08/02/18 2229     Discharge Medications: Please see discharge summary for a list of discharge medications.  Relevant Imaging Results:  Relevant Lab Results:   Additional Information SS[id number] [place], [place]

## 2018-08-04 ENCOUNTER — Inpatient Hospital Stay (HOSPITAL_COMMUNITY): Payer: PPO

## 2018-08-04 ENCOUNTER — Encounter (HOSPITAL_COMMUNITY): Payer: Self-pay | Admitting: Radiology

## 2018-08-04 DIAGNOSIS — I639 Cerebral infarction, unspecified: Secondary | ICD-10-CM

## 2018-08-04 LAB — BASIC METABOLIC PANEL
Anion gap: 9 (ref 5–15)
BUN: 7 mg/dL — ABNORMAL LOW (ref 8–23)
CO2: 25 mmol/L (ref 22–32)
Calcium: 8.6 mg/dL — ABNORMAL LOW (ref 8.9–10.3)
Chloride: 102 mmol/L (ref 98–111)
Creatinine, Ser: 0.97 mg/dL (ref 0.44–1.00)
GFR calc Af Amer: >60 mL/min (ref >60)
GFR, EST NON AFRICAN AMERICAN: 56 mL/min — AB (ref >60)
Glucose, Bld: 98 mg/dL (ref 70–99)
Potassium: 3.5 mmol/L (ref 3.5–5.1)
Sodium: 136 mmol/L (ref 135–145)

## 2018-08-04 MED ORDER — METOCLOPRAMIDE HCL 5 MG/ML IJ SOLN
10.0000 mg | Freq: Once | INTRAMUSCULAR | Status: AC
  Administered 2018-08-04: 10 mg via INTRAVENOUS
  Filled 2018-08-04: qty 2

## 2018-08-04 MED ORDER — IOPAMIDOL (ISOVUE-370) INJECTION 76%
INTRAVENOUS | Status: AC
  Filled 2018-08-04: qty 100

## 2018-08-04 MED ORDER — IOPAMIDOL (ISOVUE-370) INJECTION 76%
75.0000 mL | Freq: Once | INTRAVENOUS | Status: AC | PRN
  Administered 2018-08-04: 75 mL via INTRAVENOUS

## 2018-08-04 MED ORDER — POTASSIUM CHLORIDE CRYS ER 20 MEQ PO TBCR
40.0000 meq | EXTENDED_RELEASE_TABLET | Freq: Once | ORAL | Status: AC
  Administered 2018-08-04: 40 meq via ORAL
  Filled 2018-08-04: qty 2

## 2018-08-04 NOTE — Progress Notes (Signed)
SLP Cancellation Note  Patient Details Name: CORRINNA KARAPETYAN MRN: [id number] DOB: 1940/12/16   Cancelled treatment:       Reason Eval/Treat Not Completed: Other (comment)  Given plan for dc to SNF will defer cognitive eval to next level of care.   Rachell Druckenmiller, Katherene Ponto 08/04/2018, 11:53 AM

## 2018-08-04 NOTE — Progress Notes (Signed)
STROKE TEAM PROGRESS NOTE     SUBJECTIVE (INTERVAL HISTORY) The patient's family is not at the bedside.  She feels that the she is doing some better today.  But remains confused.    OBJECTIVE Vitals:   08/03/18 2342 08/04/18 0313 08/04/18 0810 08/04/18 1113  BP: 135/69 (!) 133/106 (!) 164/77 135/70  Pulse: 77 75 68 67  Resp: 18 17 (!) 24 16  Temp:  98 F (36.7 C) 98.5 F (36.9 C) 98.2 F (36.8 C)  TempSrc:  Oral Oral Oral  SpO2: 97% 97% 94% 96%  Weight:      Height:        CBC:  Recent Labs  Lab 08/01/18 2127 08/02/18 0506 08/03/18 0503  WBC 5.9 5.4 4.4  NEUTROABS 4.3 3.8  --   HGB 11.3* 10.5* 10.3*  HCT 35.6* 33.3* 32.0*  MCV 95.2 96.2 94.7  PLT 142* 142* 128*    Basic Metabolic Panel:  Recent Labs  Lab 08/02/18 0506 08/03/18 0503 08/04/18 0542  NA 132* 133* 136  K 3.0* 3.4* 3.5  CL 101 103 102  CO2 25 22 25   GLUCOSE 98 83 98  BUN 9 6* 7*  CREATININE 0.68 0.91 0.97  CALCIUM 8.7* 8.2* 8.6*  MG 1.7  --   --     Lipid Panel:     Component Value Date/Time   CHOL 99 Aug 23, 202019 0503   TRIG 40 Aug 23, 202019 0503   HDL 58 Aug 23, 202019 0503   CHOLHDL 1.7 Aug 23, 202019 0503   VLDL 8 Aug 23, 202019 0503   LDLCALC 33 Aug 23, 202019 0503   HgbA1c:  Lab Results  Component Value Date   HGBA1C 5.0 08/01/2018   Urine Drug Screen: No results found for: LABOPIA, COCAINSCRNUR, LABBENZ, AMPHETMU, THCU, LABBARB  Alcohol Level No results found for: [facility]  IMAGING   Ct Abdomen Pelvis Wo Contrast 08/01/2018 IMPRESSION:  1. Hazy inflammatory stranding with wall thickening about the proximal ascending colon, suspicious for acute colitis. No complication identified.  2. No other acute intra-abdominal or pelvic process.  3. Large hiatal hernia.  4. Mild sigmoid diverticulosis without evidence for acute diverticulitis.  5. Severe lumbar levoscoliosis with advanced multilevel degenerative spondylolysis and facet arthrosis.   Dg Chest 1 View 08/01/2018 IMPRESSION:  1. No acute  pulmonary process.  2. Moderate hiatal hernia.    Ct Head Wo Contrast 08/01/2018 IMPRESSION:  1. Small focal hypodensity in the right basal ganglia may reflect age indeterminate lacunar infarct. Negative for hemorrhage.  2. Atrophy and small vessel ischemic changes of the white matter.     Mr Brain Wo Contrast 08/02/2018 IMPRESSION:  1. Multiple foci of scattered punctate infarcts in bilateral cerebral and cerebellar hemispheres. This suggests a central cardiac source. Question arrhythmia. EKG yesterday demonstrated normal sinus rhythm. Aortic valve disease or atherosclerotic changes in the ascending aorta should also be considered.  2. Multifocal areas of susceptibility, predominantly within the posterior circulation. This consistent with previous punctate hemorrhage and suggests an underlying vasculitis or significant atherosclerotic small vessel disease.  3. Advanced atrophy and moderate white matter disease reflects the sequela of chronic microvascular ischemia.    Transthoracic Echocardiogram  08/02/2018 Study Conclusions  - Left ventricle: The cavity size was normal. Wall thickness was at   the upper limits of normal. Systolic function was normal. The   estimated ejection fraction was in the range of 60% to 65%. Wall   motion was normal; there were no regional wall motion   abnormalities. Doppler parameters are consistent with abnormal  left ventricular relaxation (grade 1 diastolic dysfunction). - Aortic valve: Mildly calcified annulus. Trileaflet. There was   trivial regurgitation. - Mitral valve: Mildly calcified annulus. There was trivial   regurgitation. - Left atrium: The atrium was mildly dilated. - Right atrium: Central venous pressure (est): 3 mm Hg. - Atrial septum: No defect or patent foramen ovale was identified. - Tricuspid valve: There was trivial regurgitation. - Pulmonary arteries: PA peak pressure: 24 mm Hg (S). - Pericardium, extracardiac: A prominent  pericardial fat pad was   present.    Bilateral Carotid Dopplers - pending 00/00/00     PHYSICAL EXAM Blood pressure 135/70, pulse 67, temperature 98.2 F (36.8 C), temperature source Oral, resp. rate 16, height 4\' 11"  (1.499 m), weight 59 kg, SpO2 96 %.  Pleasant elderly lady currently not in distress. . Afebrile. Head is nontraumatic. Neck is supple without bruit.    Cardiac exam no murmur or gallop. Lungs are clear to auscultation. Distal pulses are well felt. Neurological Exam :  Awake alert oriented x2.  Diminished attention registration and recall.  Follows two-step commands.  Extraocular movements are full range without nystagmus.  Pupils equal reactive.  Blinks to threat bilaterally.  Face is symmetric without weakness.  Tongue midline.  Motor system exam reveals symmetric and equal strength in all 4 extremities without focal weakness.  Deep tendon reflexes appear symmetric plantars are downgoing.  Sensation appears intact.  Gait not tested.   HOME MEDICATIONS:  Medications Prior to Admission  Medication Sig Dispense Refill  . Calcium Carbonate-Vitamin D (CALTRATE 600+D) 600-400 MG-UNIT tablet Take 1 tablet by mouth 2 (two) times daily.    . cetirizine (ZYRTEC) 10 MG tablet Take 10 mg by mouth daily as needed for allergies.     . Cholecalciferol (VITAMIN D) 2000 UNITS CAPS Take 2,000 Units by mouth daily.     Marland Kitchen donepezil (ARICEPT) 5 MG tablet Take 5 mg by mouth at bedtime.    . DULoxetine (CYMBALTA) 60 MG capsule Take 60 mg by mouth daily.    . folic acid (FOLVITE) 751 MCG tablet Take 800 mcg by mouth daily.    . Magnesium 500 MG CAPS Take 500 mg by mouth daily.     . memantine (NAMENDA) 10 MG tablet Take 10 mg by mouth 2 (two) times daily.    . methotrexate (RHEUMATREX) 2.5 MG tablet Take 22.5 mg by mouth once a week. Every Tuesday    . OVER THE COUNTER MEDICATION Take 8 oz by mouth daily as needed (leg pain). Tonic Water with Quinine    . pantoprazole (PROTONIX) 40 MG  tablet Take 1 tablet (40 mg total) by mouth daily. 90 tablet 3  . potassium chloride (K-DUR) 10 MEQ tablet Take 10 mEq by mouth at bedtime.    . Zinc 50 MG CAPS Take 50 mg by mouth daily.     Marland Kitchen albuterol (PROAIR HFA) 108 (90 Base) MCG/ACT inhaler Inhale 2 puffs into the lungs every 6 (six) hours as needed for wheezing or shortness of breath.     . denosumab (PROLIA) 60 MG/ML SOLN injection Inject 60 mg into the skin every 6 (six) months. Administer in upper arm, thigh, or abdomen, got in June 2019    . HYDROcodone-acetaminophen (NORCO/VICODIN) 5-325 MG tablet Take 1 tablet by mouth daily as needed for pain.    Marland Kitchen lisinopril (PRINIVIL,ZESTRIL) 10 MG tablet Take 5 mg by mouth daily. Son says she takes it as needed when she feels her BP is high,  she will check it and then take it if needed.    . senna (EQ NATURAL VEGETABLE LAXATIVE) 8.6 MG tablet Take 1 tablet by mouth daily as needed for constipation.        HOSPITAL MEDICATIONS:  . clopidogrel  75 mg Oral Daily  . donepezil  5 mg Oral QHS  . DULoxetine  60 mg Oral Daily  . enoxaparin (LOVENOX) injection  40 mg Subcutaneous Q24H  . memantine  10 mg Oral BID  . pantoprazole  40 mg Oral Daily  . sodium chloride flush  3 mL Intravenous Q12H    ALLERGIES Allergies  Allergen Reactions  . Celecoxib Swelling and Other (See Comments)  . Ibuprofen Swelling and Other (See Comments)  . Diphenhydramine Rash and Other (See Comments)    Wide awake.   Micheline Maze [Propoxyphene N-Acetaminophen] Nausea And Vomiting    PAST MEDICAL HISTORY Past Medical History:  Diagnosis Date  . Anxiety   . Arm fracture, left   . Breast cancer ([place])   . Bulging disc   . Cardioembolic stroke ([place]) 79/10/4095  . Confusion   . Depression   . Difficulty swallowing solids   . GERD (gastroesophageal reflux disease)   . Hypertension   . Hypothyroidism   . Invasive ductal carcinoma of left breast ([place]) 02/24/2014  . Neuropathy   . Osteoporosis   . Rheumatoid  arteritis (H[facility])    RA  . Rotator cuff disorder, right   . Scoliosis   . Shortness of breath dyspnea    "I have reactive airway disease".    SURGICAL HISTORY Past Surgical History:  Procedure Laterality Date  . ABDOMINAL HYSTERECTOMY    . Barbie Banner OSTEOTOMY Right 10/28/2014   Procedure: Barbie Banner OSTEOTOMY RIGHT FOOT;  Surgeon: Marcheta Grammes, DPM;  Location: [facility];  Service: Podiatry;  Laterality: Right;  . BIOPSY  05/15/2018   Procedure: BIOPSY;  Surgeon: Rogene Houston, MD;  Location: [facility];  Service: Endoscopy;;  gastric  . BONE BIOPSY  09/29/2015   Procedure: BONE BIOPSY AND BONE CULTURE SECOND TOE RIGHT FOOT;  Surgeon: Caprice Beaver, DPM;  Location: [facility];  Service: Podiatry;;  . BREAST SURGERY Left    lumpectomy and axillary lymph node with re-excision 2 weeks later   . BUNIONECTOMY Right 10/28/2014   Procedure: SILVER BUNIONECTOMY RIGHT FOOT;  Surgeon: Marcheta Grammes, DPM;  Location: [facility];  Service: Podiatry;  Laterality: Right;  . CATARACT EXTRACTION W/PHACO Left 08/10/2017   Procedure: CATARACT EXTRACTION PHACO AND INTRAOCULAR LENS PLACEMENT LEFT EYE;  Surgeon: Baruch Goldmann, MD;  Location: [facility];  Service: Ophthalmology;  Laterality: Left;  CDE: 13.81  . CATARACT EXTRACTION W/PHACO Right 09/07/2017   Procedure: CATARACT EXTRACTION PHACO AND INTRAOCULAR LENS PLACEMENT RIGHT EYE;  Surgeon: Baruch Goldmann, MD;  Location: [facility];  Service: Ophthalmology;  Laterality: Right;  right  . COLONOSCOPY    . DILATION AND CURETTAGE OF UTERUS    . ESOPHAGEAL DILATION N/A 05/15/2018   Procedure: ESOPHAGEAL DILATION;  Surgeon: Rogene Houston, MD;  Location[facility];  Service: Endoscopy;  Laterality: N/A;  . esophageal stricture    . ESOPHAGOGASTRODUODENOSCOPY N/A 05/15/2018   Procedure: ESOPHAGOGASTRODUODENOSCOPY (EGD);  Surgeon: Rogene Houston, MD;  Location[facility];  Service: Endoscopy;  Laterality: N/A;  1:45  . HAMMER TOE SURGERY Right 10/28/2014    Procedure: HAMMER TOE CORRECTION 2ND AND 3RD TOE RIGHT FOOT;  Surgeon: Marcheta Grammes, DPM;  Location: [facility];  Service: Podiatry;  Laterality: Right;  . LUMBAR LAMINECTOMY/DECOMPRESSION MICRODISCECTOMY Left 03/13/2016   Procedure: Left Lumbar Four-Five, Lumbar Five-Sacral One Laminotomy/Foraminotomy;  Surgeon: Kevan Ny Ditty, MD;  [facility];  Service: Neurosurgery;  Laterality: Left;  . REMOVAL OF IMPLANT Right 09/29/2015   Procedure: REMOVAL OF IMPLANT 2ND TOE RIGHT FOOT;  Surgeon: Caprice Beaver, DPM;  Location: [facility];  Service: Podiatry;  Laterality: Right;      ASSESSMENT/PLAN Ms. CALAYA GILDNER is a 77 y.o. female with history of dementia, hypertension, hypothyroidism, neuropathy, rheumatoid arthritis, history of GI bleeding and hemoptysis 2 months ago.  presenting with  She did not receive IV t-PA due to altered mental status, colitis and hypokalemia.  Stroke: multiple infarcts - embolic - unknown source  Resultant confusion and altered mental status superimposed upon baseline dementia  CT head - Small focal hypodensity in the right basal ganglia may reflect age indeterminate lacunar infarct.  MRI head - Multiple foci of scattered punctate infarcts in bilateral cerebral and cerebellar hemispheres. Multifocal areas of susceptibility, predominantly within the posterior circulation. This consistent with previous punctate hemorrhage and suggests an underlying vasculitis or significant atherosclerotic small vessel disease.   MRA head - not performed.  Carotid Doppler - pending  2D Echo  - EF 60 - 65%. No cardiac source of emboli identified.   LDL - 33  HgbA1c - 5.0  UDS - not performed  VTE prophylaxis - Lovenox  Diet  - Heart healthy with thin liquids.  No antithrombotic prior to admission, now on No antithrombotic  Ongoing aggressive stroke risk factor management  Therapy recommendations:  pending  Disposition:   Pending  Hypertension  Stable . Permissive hypertension (OK if < 220/120) but gradually normalize in 5-7 days . Long-term BP goal normotensive  Hyperlipidemia  Lipid lowering medication PTA:  none  LDL 33, goal < 70  Current lipid lowering medication: none  Continue statin at discharge   Other Stroke Risk Factors  Advanced age   Other Active Problems  Blood and urine cultures  Anemia  Mild thrombocytopenia  Mild hypokalemia  Mild hyponatremia    Plan  Currently on Plavix as she has history of aspirin allergy suspect septic emboli -history of GI bleed and hemoptysis  Await cultures (pt on flagyl and Cipro) afebrile  TEE on Monday no loop (rule out endocarditis)   Possible acute colitis by CT - (pt on flagyl and Cipro)   Hospital day # 2   .  She presented with generalized weakness and increased confusion in the setting of hypokalemia and colitis and MRI scan shows by cerebral embolic infarcts etiology to be determined. Start plavix for now given aspirin allergy  .  Will need transesophageal echocardiogram to look for cardiac vegetations and if blood cultures and TEE are both  negative then  prolonged cardiac monitoring to look for PAF.  Stroke team will follow Antony Contras, MD Medical Director [place] Pager: [phone] 08/04/2018 11:58 AM   To contact Stroke Continuity provider, please refer to [email or web address] After hours, contact General Neurology

## 2018-08-04 NOTE — Progress Notes (Signed)
PROGRESS NOTE    [name]  QJJ:[id number] DOB: Jan 21, 1941 DOA: 08/01/2018 PCP: Celene Squibb, MD   Brief Narrative: Patient is a 77 year old female with past medical history of dementia, hypertension, rheumatoid arthritis who presented to the emergency department for the evaluation of 3-day history of lethargy, increased confusion and abdominal pain.  CT of the abdomen/pelvis revealed hazy inflammatory stranding around the proximal ascending colon with wall thickening suspicious for acute colitis.  MRI was also done for ongoing lethargy and confusion which revealed acute ischemic stroke.  Currently on antibiotics.  Neurology also following.  PT recommends skilled nursing facility.  Social worker consulted.Plan for TEE to rule out septic emboli/endocarditis.  Assessment & Plan:   Principal Problem:   Cardioembolic stroke [facility]) Active Problems:   Rheumatoid arthritis ([place])   Acute colitis   Hypothyroidism   Hyponatremia   Hypokalemia   Infarction of right basal ganglia (HCC)   Hypertension   Dementia ([place])   Acute cardioembolic stroke ([place])   Acute ischemic stroke: Most likely cardioembolic on etiology.  Neurology following.  MRI showed Multiple foci of scattered punctate infarcts in bilateral cerebral and cerebellar hemispheres.  Neurology suspecting septic emboli.  Plan for TEE tomorrow.  N.p.o. after midnight.Blood cultures have been negative so far. Echocardiogram showed ejection fraction of 66-65%, no wall motion abnormality.  She might also need prolonged cardiac monitoring to look for paroxysmal A. fib. Started on Plavix .She has  history of GI bleed. LDL of 33.  HbA1c of  5. Pending carotid doppler  Acute encephalopathy: Secondary to stroke,colitis,dementia.  Mental status has improved and is on baseline.  Acute colitis: Presented with 3-day history of lethargy, abdominal pain.  Also found to have tenderness in the right lower and mid abdomen on presentation.  CT  findings suggestive of ascending colitis.  Started on Cipro and Flagyl.  Abdominal pain has improved.  Hyponatremia: Improved  Hypokalemia: Supplemented with potassium.  Dementia: Continue Namenda and Aricept  Rheumatoid Arthritis: On methotrexate at home.  Weakness/deconditioning: PT evaluated the patient and recommended skilled nursing facility on discharge.  Social worker consulted.   DVT prophylaxis: Lovenox Code Status: Full Family Communication: Discussed with daughter yesterday Disposition Plan: SNF after full workup   Consultants: Neurology  Procedures:None  Antimicrobials: Cipro and Flagyl  Subjective: Patient seen and examined the bedside.  Hemodynamically stable.  Abdominal pain has improved .No acute issues  Objective: Vitals:   08/03/18 2342 08/04/18 0313 08/04/18 0810 08/04/18 1113  BP: 135/69 (!) 133/106 (!) 164/77 135/70  Pulse: 77 75 68 67  Resp: 18 17 (!) 24 16  Temp:  98 F (36.7 C) 98.5 F (36.9 C) 98.2 F (36.8 C)  TempSrc:  Oral Oral Oral  SpO2: 97% 97% 94% 96%  Weight:      Height:        Intake/Output Summary (Last 24 hours) at 08/04/2018 1115 Last data filed at 08/04/2018 0300 Gross per 24 hour  Intake 1040.88 ml  Output -  Net 1040.88 ml   Filed Weights   08/01/18 2054 08/02/18 0102  Weight: 57.1 kg 59 kg    Examination:  General exam: Appears calm and comfortable ,Not in distress,average built,weak HEENT:PERRL,Oral mucosa moist, Ear/Nose normal on gross exam Respiratory system: Bilateral equal air entry, normal vesicular breath sounds, no wheezes or crackles  Cardiovascular system: S1 & S2 heard, RRR. No JVD, murmurs, rubs, gallops or clicks. No pedal edema. Gastrointestinal system: Abdomen is nondistended, soft and nontender. No organomegaly or masses  felt. Normal bowel sounds heard. Central nervous system: Alert and oriented. No focal neurological deficits. Extremities: No edema, no clubbing ,no cyanosis, distal peripheral  pulses palpable. Skin: No rashes, lesions or ulcers,no icterus ,no pallor MSK: Normal muscle bulk,tone ,power     Data Reviewed: I have personally reviewed following labs and imaging studies  CBC: Recent Labs  Lab 08/01/18 2127 08/02/18 0506 08/03/18 0503  WBC 5.9 5.4 4.4  NEUTROABS 4.3 3.8  --   HGB 11.3* 10.5* 10.3*  HCT 35.6* 33.3* 32.0*  MCV 95.2 96.2 94.7  PLT 142* 142* 093*   Basic Metabolic Panel: Recent Labs  Lab 08/01/18 2127 08/02/18 0506 08/03/18 0503 08/04/18 0542  NA 133* 132* 133* 136  K 2.9* 3.0* 3.4* 3.5  CL 96* 101 103 102  CO2 28 25 22 25   GLUCOSE 118* 98 83 98  BUN 10 9 6* 7*  CREATININE 0.81 0.68 0.91 0.97  CALCIUM 9.2 8.7* 8.2* 8.6*  MG  --  1.7  --   --    GFR: Estimated Creatinine Clearance: 36.7 mL/min (by C-G formula based on SCr of 0.97 mg/dL). Liver Function Tests: Recent Labs  Lab 08/01/18 2127 08/03/18 0503  AST 39 30  ALT 23 19  ALKPHOS 92 68  BILITOT 0.8 0.9  PROT 6.5 5.3*  ALBUMIN 3.2* 2.5*   Recent Labs  Lab 08/01/18 2127  LIPASE 38   No results for input(s): AMMONIA in the last 168 hours. Coagulation Profile: Recent Labs  Lab 08/01/18 2127  INR 1.04   Cardiac Enzymes: Recent Labs  Lab 08/01/18 2127  TROPONINI <0.03   BNP (last 3 results) No results for input(s): PROBNP in the last 8760 hours. HbA1C: Recent Labs    08/01/18 2128  HGBA1C 5.0   CBG: No results for input(s): GLUCAP in the last 168 hours. Lipid Profile: Recent Labs    08/03/18 0503  CHOL 99  HDL 58  LDLCALC 33  TRIG 40  CHOLHDL 1.7   Thyroid Function Tests: No results for input(s): TSH, T4TOTAL, FREET4, T3FREE, THYROIDAB in the last 72 hours. Anemia Panel: No results for input(s): VITAMINB12, FOLATE, FERRITIN, TIBC, IRON, RETICCTPCT in the last 72 hours. Sepsis Labs: Recent Labs  Lab 08/01/18 2127 08/02/18 0002  LATICACIDVEN 2.0* 1.9    Recent Results (from the past 240 hour(s))  Urine culture     Status: None    Collection Time: 08/01/18  9:50 PM  Result Value Ref Range Status   Specimen Description   Final    URINE, RANDOM Performed at [facility], [address], [place], [place] [address]    Special Requests   Final    NONE Performed at [facility], [address] [place], [place] [address]    Culture   Final    NO GROWTH Performed at [facility], [place] [address], [place], [place] [address]    Report Status 06-09-2018 FINAL  Final  Culture, blood (Routine X 2) w Reflex to ID Panel     Status: None (Preliminary result)   Collection Time: 08/03/18  9:07 AM  Result Value Ref Range Status   Specimen Description BLOOD RIGHT ANTECUBITAL  Final   Special Requests   Final    BOTTLES DRAWN AEROBIC AND ANAEROBIC Blood Culture adequate volume   Culture   Final    NO GROWTH < 24 HOURS Performed at [facility], [place] [address], [place], [place] [address]    Report Status PENDING  Incomplete  Culture, blood (Routine X 2) w Reflex to ID Panel     Status: None (Preliminary result)   Collection Time: 08/03/18  9:13 AM  Result Value Ref Range Status   Specimen Description BLOOD LEFT HAND  Final   Special Requests   Final    BOTTLES DRAWN AEROBIC AND ANAEROBIC Blood Culture adequate volume   Culture   Final    NO GROWTH < 24 HOURS Performed at [facility], [address], [place], [place] [address]    Report Status PENDING  Incomplete         Radiology Studies: No results found.      Scheduled Meds: . clopidogrel  75 mg Oral Daily  . donepezil  5 mg Oral QHS  . DULoxetine  60 mg Oral Daily  . enoxaparin (LOVENOX) injection  40 mg Subcutaneous Q24H  . memantine  10 mg Oral BID  . pantoprazole  40 mg Oral Daily  . potassium chloride  40 mEq Oral Once  . sodium chloride flush  3 mL Intravenous Q12H   Continuous Infusions: . ciprofloxacin 400 mg (08/04/18 0014)  . metronidazole 500 mg (08/04/18 0748)     LOS: 2 days    Time spent: 35 mins.More than 50% of  that time was spent in counseling and/or coordination of care.      Shelly Coss, MD [facility] Pager [phone]  If 7PM-7AM, please contact night-coverage [email or web address] Password [email or web address] 08/04/2018, 11:15 AM

## 2018-08-04 NOTE — Progress Notes (Signed)
P.t. Family members are concerned about p.t. Not having a bowel movement while in hospital. Last bowel movement was 08/01/2018.

## 2018-08-04 NOTE — Progress Notes (Signed)
Carotid duplex prelim: 1-39% ICA stenosis.  [name], RDMS, RVT   

## 2018-08-05 ENCOUNTER — Inpatient Hospital Stay (HOSPITAL_COMMUNITY): Payer: PPO | Admitting: Anesthesiology

## 2018-08-05 ENCOUNTER — Encounter (HOSPITAL_COMMUNITY): Payer: Self-pay | Admitting: Cardiovascular Disease

## 2018-08-05 ENCOUNTER — Encounter (HOSPITAL_COMMUNITY)
Admission: EM | Disposition: A | Discharge status: Home Health | Payer: Self-pay | Source: Home / Self Care | Attending: Internal Medicine

## 2018-08-05 ENCOUNTER — Inpatient Hospital Stay (HOSPITAL_COMMUNITY)
Admit: 2018-08-05 | Discharge: 2018-08-05 | Disposition: A | Discharge status: Home / Self Care | Payer: PPO | Attending: Cardiovascular Disease | Admitting: Cardiovascular Disease

## 2018-08-05 DIAGNOSIS — F0390 Unspecified dementia without behavioral disturbance: Secondary | ICD-10-CM

## 2018-08-05 DIAGNOSIS — M069 Rheumatoid arthritis, unspecified: Secondary | ICD-10-CM

## 2018-08-05 DIAGNOSIS — I6389 Other cerebral infarction: Secondary | ICD-10-CM

## 2018-08-05 DIAGNOSIS — K529 Noninfective gastroenteritis and colitis, unspecified: Secondary | ICD-10-CM

## 2018-08-05 DIAGNOSIS — I10 Essential (primary) hypertension: Secondary | ICD-10-CM

## 2018-08-05 HISTORY — PX: TEE WITHOUT CARDIOVERSION: SHX5443

## 2018-08-05 SURGERY — ECHOCARDIOGRAM, TRANSESOPHAGEAL
Anesthesia: Monitor Anesthesia Care

## 2018-08-05 SURGERY — INVASIVE LAB ABORTED CASE

## 2018-08-05 MED ORDER — CIPROFLOXACIN HCL 500 MG PO TABS
500.0000 mg | ORAL_TABLET | Freq: Two times a day (BID) | ORAL | Status: DC
  Administered 2018-08-05 – 2018-08-06 (×2): 500 mg via ORAL
  Filled 2018-08-05 (×3): qty 1

## 2018-08-05 MED ORDER — ONDANSETRON HCL 4 MG/2ML IJ SOLN: 4.0000 mg | Freq: Four times a day (QID) | INTRAMUSCULAR | Status: DC | PRN

## 2018-08-05 MED ORDER — LIDOCAINE-EPINEPHRINE 1 %-1:100000 IJ SOLN
INTRAMUSCULAR | Status: AC
  Filled 2018-08-05: qty 1

## 2018-08-05 MED ORDER — PROPOFOL 500 MG/50ML IV EMUL
INTRAVENOUS | Status: DC | PRN
  Administered 2018-08-05: 40 ug/kg/min via INTRAVENOUS

## 2018-08-05 MED ORDER — METRONIDAZOLE 500 MG PO TABS
500.0000 mg | ORAL_TABLET | Freq: Three times a day (TID) | ORAL | Status: DC
  Administered 2018-08-05 – 2018-08-06 (×3): 500 mg via ORAL
  Filled 2018-08-05 (×4): qty 1

## 2018-08-05 MED ORDER — PHENYLEPHRINE HCL 10 MG/ML IJ SOLN
INTRAMUSCULAR | Status: DC | PRN
  Administered 2018-08-05 (×2): 60 ug via INTRAVENOUS

## 2018-08-05 MED ORDER — POLYETHYLENE GLYCOL 3350 17 G PO PACK
17.0000 g | PACK | Freq: Every day | ORAL | Status: DC
  Administered 2018-08-05 – 2018-08-06 (×2): 17 g via ORAL
  Filled 2018-08-05 (×2): qty 1

## 2018-08-05 MED ORDER — LIDOCAINE 2% (20 MG/ML) 5 ML SYRINGE
INTRAMUSCULAR | Status: DC | PRN
  Administered 2018-08-05: 40 mg via INTRAVENOUS

## 2018-08-05 MED ORDER — SODIUM CHLORIDE 0.9 % IV SOLN
INTRAVENOUS | Status: DC | PRN
  Administered 2018-08-05: 11:00:00 via INTRAVENOUS

## 2018-08-05 MED ORDER — PROPOFOL 10 MG/ML IV BOLUS
INTRAVENOUS | Status: DC | PRN
  Administered 2018-08-05 (×2): 10 mg via INTRAVENOUS
  Administered 2018-08-05: 35 mg via INTRAVENOUS

## 2018-08-05 SURGICAL SUPPLY — 1 items: PACK LOOP INSERTION (CUSTOM PROCEDURE TRAY) ×1 IMPLANT

## 2018-08-05 NOTE — Progress Notes (Addendum)
Attempt at [facility] monitor was made. The patient would not put her hands down at her sides and would not allow [place] to uncover the anterior chest for implantation. Due to that, the procedure was canceled. Would recommend a 30 day monitor and if negative, would consider a LINQ monitor at that time.  Mehmet Scally Curt Bears, MD 08/05/2018 4:18 PM

## 2018-08-05 NOTE — Social Work (Addendum)
12:36pm- spoke with pt daughter, per pt daughter the pt would like to go home. Pt daughter states that she will go with what her mother would like. Candida Peeling, RN Case Manager to f/u with pt and pt daughter in the room. Will ensure that pt and pt daughter are set up for home before cancelling the insurance auth with [employer].  11:26am- Attempted call to daughter due to pt being off the floor at Endo procedure. Unable to leave message due to mailbox being full. Will attempt to see pt and pt daughter.  10:19am- Insurance authorization initiated through [employer]  PASRR pending with [place] [employer].   Westley Hummer, MSW, [place] Work [phone]

## 2018-08-05 NOTE — Care Management Important Message (Signed)
Important Message  Patient Details  Name: [name] MRN: [id number] Date of Birth: 02/16/1941   Medicare Important Message Given:  Yes    [name] 08/05/2018, 5:00 PM

## 2018-08-05 NOTE — Progress Notes (Signed)
STROKE TEAM PROGRESS NOTE   SUBJECTIVE (INTERVAL HISTORY) Daughter and the PT therapist are at bedside. TEE done this am, did not show any endocarditis.  Plan for loop recorder, however, patient not corporative at procedure.  Loop recorder procedure aborted.  Will do 30-day Cardic event monitoring.    OBJECTIVE Vitals:   08/05/18 0928 08/05/18 1101 08/05/18 1110 08/05/18 1130  BP: (!) 159/67 (!) 166/62 (!) 145/77 (!) 141/72  Pulse:  89 96 89  Resp:  20 17 16   Temp: 98.5 F (36.9 C) 98.1 F (36.7 C)  98.6 F (37 C)  TempSrc: Oral Oral  Oral  SpO2:  95% 94% 95%  Weight: 59 kg     Height: 4\' 11"  (1.499 m)       CBC:  Recent Labs  Lab 08/01/18 2127 08/02/18 0506 08/03/18 0503  WBC 5.9 5.4 4.4  NEUTROABS 4.3 3.8  --   HGB 11.3* 10.5* 10.3*  HCT 35.6* 33.3* 32.0*  MCV 95.2 96.2 94.7  PLT 142* 142* 128*    Basic Metabolic Panel:  Recent Labs  Lab 08/02/18 0506 08/03/18 0503 08/04/18 0542  NA 132* 133* 136  K 3.0* 3.4* 3.5  CL 101 103 102  CO2 25 22 25   GLUCOSE 98 83 98  BUN 9 6* 7*  CREATININE 0.68 0.91 0.97  CALCIUM 8.7* 8.2* 8.6*  MG 1.7  --   --     Lipid Panel:     Component Value Date/Time   CHOL 99 01/12/202019 0503   TRIG 40 01/12/202019 0503   HDL 58 01/12/202019 0503   CHOLHDL 1.7 01/12/202019 0503   VLDL 8 01/12/202019 0503   LDLCALC 33 01/12/202019 0503   HgbA1c:  Lab Results  Component Value Date   HGBA1C 5.0 08/01/2018   Urine Drug Screen: No results found for: LABOPIA, COCAINSCRNUR, LABBENZ, AMPHETMU, THCU, LABBARB  Alcohol Level No results found for: [facility]  IMAGING  Ct Abdomen Pelvis Wo Contrast 08/01/2018 IMPRESSION:  1. Hazy inflammatory stranding with wall thickening about the proximal ascending colon, suspicious for acute colitis. No complication identified.  2. No other acute intra-abdominal or pelvic process.  3. Large hiatal hernia.  4. Mild sigmoid diverticulosis without evidence for acute diverticulitis.  5. Severe lumbar  levoscoliosis with advanced multilevel degenerative spondylolysis and facet arthrosis.   Dg Chest 1 View 08/01/2018 IMPRESSION:  1. No acute pulmonary process.  2. Moderate hiatal hernia.    Ct Head Wo Contrast 08/01/2018 IMPRESSION:  1. Small focal hypodensity in the right basal ganglia may reflect age indeterminate lacunar infarct. Negative for hemorrhage.  2. Atrophy and small vessel ischemic changes of the white matter.     Mr Brain Wo Contrast 08/02/2018 IMPRESSION:  1. Multiple foci of scattered punctate infarcts in bilateral cerebral and cerebellar hemispheres. This suggests a central cardiac source. Question arrhythmia. EKG yesterday demonstrated normal sinus rhythm. Aortic valve disease or atherosclerotic changes in the ascending aorta should also be considered.  2. Multifocal areas of susceptibility, predominantly within the posterior circulation. This consistent with previous punctate hemorrhage and suggests an underlying vasculitis or significant atherosclerotic small vessel disease.  3. Advanced atrophy and moderate white matter disease reflects the sequela of chronic microvascular ischemia.    Transthoracic Echocardiogram  08/02/2018 Study Conclusions  - Left ventricle: The cavity size was normal. Wall thickness was at   the upper limits of normal. Systolic function was normal. The   estimated ejection fraction was in the range of 60% to 65%. Wall  motion was normal; there were no regional wall motion   abnormalities. Doppler parameters are consistent with abnormal   left ventricular relaxation (grade 1 diastolic dysfunction). - Aortic valve: Mildly calcified annulus. Trileaflet. There was   trivial regurgitation. - Mitral valve: Mildly calcified annulus. There was trivial   regurgitation. - Left atrium: The atrium was mildly dilated. - Right atrium: Central venous pressure (est): 3 mm Hg. - Atrial septum: No defect or patent foramen ovale was identified. -  Tricuspid valve: There was trivial regurgitation. - Pulmonary arteries: PA peak pressure: 24 mm Hg (S). - Pericardium, extracardiac: A prominent pericardial fat pad was   present.   Ct Angio Head W Or Wo Contrast  Result Date: 08/05/2018 CLINICAL DATA:  Follow-up examination for acute stroke. EXAM: CT ANGIOGRAPHY HEAD AND NECK TECHNIQUE: Multidetector CT imaging of the head and neck was performed using the standard protocol during bolus administration of intravenous contrast. Multiplanar CT image reconstructions and MIPs were obtained to evaluate the vascular anatomy. Carotid stenosis measurements (when applicable) are obtained utilizing NASCET criteria, using the distal internal carotid diameter as the denominator. CONTRAST:  65mL ISOVUE-370 IOPAMIDOL (ISOVUE-370) INJECTION 76% COMPARISON:  Prior MRI from 08/02/2018 and CT from 08/01/2018 FINDINGS: CT HEAD FINDINGS Brain: Atrophy with chronic microvascular ischemic disease again noted. Previously identified small volume ischemic infarcts not visible by CT. No other acute large vessel territory infarct. No acute intracranial hemorrhage. No mass lesion, midline shift or mass effect. No hydrocephalus. No extra-axial fluid collection. Vascular: No hyperdense vessel. Scattered vascular calcifications noted within the carotid siphons. Skull: Scalp soft tissues and calvarium within normal limits. Sinuses: Paranasal sinuses are largely clear.  No mastoid effusion. Orbits: Globes and orbital soft tissues demonstrate no acute finding. Review of the MIP images confirms the above findings CTA NECK FINDINGS Aortic arch: Examination mildly degraded by motion artifact. Visualized aortic arch of normal caliber with normal 3 vessel morphology. Mild atheromatous plaque within the proximal descending intrathoracic aorta. No flow-limiting stenosis about the origin of the great vessels. Visualized subclavian arteries widely patent. Right carotid system: Right common carotid  artery tortuous proximally but widely patent to the bifurcation. No significant atheromatous narrowing about the right bifurcation. Right ICA widely patent from the bifurcation to the skull base without stenosis, dissection, or occlusion. Left carotid system: Left common carotid artery tortuous proximally but widely patent to the bifurcation. No significant atheromatous change or stenosis about the left bifurcation. Left ICA patent from the bifurcation to the skull base without stenosis, dissection or occlusion. Vertebral arteries: Both of the vertebral arteries arise from the subclavian arteries. Vertebral arteries are somewhat tortuous proximally but widely patent to the skull base without stenosis, dissection, or occlusion. Left vertebral artery dominant. Skeleton: No acute osseous abnormality. Chronic T6 compression fracture with up to approximately 40-50% height loss and 4 mm bony retropulsion. Severe cervical spondylolysis extending from C3-4 through C7-T1. Associated 3 mm retrolisthesis of C5 on C6. Degenerative changes noted about the left TMJ. No worrisome osseous lesions. Other neck: No other acute soft tissue abnormality within the neck. Upper chest: Visualized upper chest demonstrates no acute finding. Partially visualized lungs are clear. Review of the MIP images confirms the above findings CTA HEAD FINDINGS Anterior circulation: Petrous segments widely patent bilaterally. Mild smooth atheromatous plaque within the cavernous/supraclinoid ICAs with relatively mild diffuse narrowing. ICA termini widely patent. A1 segments patent bilaterally. Normal anterior communicating artery. Anterior cerebral arteries widely patent to their distal aspects without stenosis. M1 segments widely patent bilaterally.  No proximal M2 occlusion. Distal MCA branches well perfused and symmetric. Posterior circulation: Vertebral arteries widely patent to the vertebrobasilar junction without stenosis. Left vertebral artery  dominant. Patent left PICA. Right PICA not visualized. Dominant right AICA. Basilar artery widely patent to its distal aspect without stenosis. Superior cerebral arteries patent bilaterally. Left PCA supplied via the basilar. Hypoplastic right P1 with prominent right posterior communicating artery. PCAs well perfused to their distal aspects without stenosis. Venous sinuses: Grossly patent, although not well assessed due to arterial timing of the contrast bolus. Anatomic variants: None significant.  No intracranial aneurysm. Delayed phase: No abnormal enhancement. Review of the MIP images confirms the above findings IMPRESSION: 1. Negative CTA of the head and neck. No large vessel occlusion. No hemodynamically significant or correctable stenosis. 2. Minor carotid siphon atherosclerotic change for age. No other significant atheromatous disease involving the major arterial vasculature of the head and neck. 3. Tortuosity of the major arterial vasculature of the neck, suggesting chronic underlying hypertension. 4. Chronic T6 compression fracture as above. 5. Advanced cervical spondylolysis at C3-4 through C7-T1. Electronically Signed   By: Jeannine Boga M.D.   On: 08/05/2018 01:55   Ct Angio Neck W Or Wo Contrast  Result Date: 08/05/2018 CLINICAL DATA:  Follow-up examination for acute stroke. EXAM: CT ANGIOGRAPHY HEAD AND NECK TECHNIQUE: Multidetector CT imaging of the head and neck was performed using the standard protocol during bolus administration of intravenous contrast. Multiplanar CT image reconstructions and MIPs were obtained to evaluate the vascular anatomy. Carotid stenosis measurements (when applicable) are obtained utilizing NASCET criteria, using the distal internal carotid diameter as the denominator. CONTRAST:  44mL ISOVUE-370 IOPAMIDOL (ISOVUE-370) INJECTION 76% COMPARISON:  Prior MRI from 08/02/2018 and CT from 08/01/2018 FINDINGS: CT HEAD FINDINGS Brain: Atrophy with chronic microvascular  ischemic disease again noted. Previously identified small volume ischemic infarcts not visible by CT. No other acute large vessel territory infarct. No acute intracranial hemorrhage. No mass lesion, midline shift or mass effect. No hydrocephalus. No extra-axial fluid collection. Vascular: No hyperdense vessel. Scattered vascular calcifications noted within the carotid siphons. Skull: Scalp soft tissues and calvarium within normal limits. Sinuses: Paranasal sinuses are largely clear.  No mastoid effusion. Orbits: Globes and orbital soft tissues demonstrate no acute finding. Review of the MIP images confirms the above findings CTA NECK FINDINGS Aortic arch: Examination mildly degraded by motion artifact. Visualized aortic arch of normal caliber with normal 3 vessel morphology. Mild atheromatous plaque within the proximal descending intrathoracic aorta. No flow-limiting stenosis about the origin of the great vessels. Visualized subclavian arteries widely patent. Right carotid system: Right common carotid artery tortuous proximally but widely patent to the bifurcation. No significant atheromatous narrowing about the right bifurcation. Right ICA widely patent from the bifurcation to the skull base without stenosis, dissection, or occlusion. Left carotid system: Left common carotid artery tortuous proximally but widely patent to the bifurcation. No significant atheromatous change or stenosis about the left bifurcation. Left ICA patent from the bifurcation to the skull base without stenosis, dissection or occlusion. Vertebral arteries: Both of the vertebral arteries arise from the subclavian arteries. Vertebral arteries are somewhat tortuous proximally but widely patent to the skull base without stenosis, dissection, or occlusion. Left vertebral artery dominant. Skeleton: No acute osseous abnormality. Chronic T6 compression fracture with up to approximately 40-50% height loss and 4 mm bony retropulsion. Severe cervical  spondylolysis extending from C3-4 through C7-T1. Associated 3 mm retrolisthesis of C5 on C6. Degenerative changes noted about the left  TMJ. No worrisome osseous lesions. Other neck: No other acute soft tissue abnormality within the neck. Upper chest: Visualized upper chest demonstrates no acute finding. Partially visualized lungs are clear. Review of the MIP images confirms the above findings CTA HEAD FINDINGS Anterior circulation: Petrous segments widely patent bilaterally. Mild smooth atheromatous plaque within the cavernous/supraclinoid ICAs with relatively mild diffuse narrowing. ICA termini widely patent. A1 segments patent bilaterally. Normal anterior communicating artery. Anterior cerebral arteries widely patent to their distal aspects without stenosis. M1 segments widely patent bilaterally. No proximal M2 occlusion. Distal MCA branches well perfused and symmetric. Posterior circulation: Vertebral arteries widely patent to the vertebrobasilar junction without stenosis. Left vertebral artery dominant. Patent left PICA. Right PICA not visualized. Dominant right AICA. Basilar artery widely patent to its distal aspect without stenosis. Superior cerebral arteries patent bilaterally. Left PCA supplied via the basilar. Hypoplastic right P1 with prominent right posterior communicating artery. PCAs well perfused to their distal aspects without stenosis. Venous sinuses: Grossly patent, although not well assessed due to arterial timing of the contrast bolus. Anatomic variants: None significant.  No intracranial aneurysm. Delayed phase: No abnormal enhancement. Review of the MIP images confirms the above findings IMPRESSION: 1. Negative CTA of the head and neck. No large vessel occlusion. No hemodynamically significant or correctable stenosis. 2. Minor carotid siphon atherosclerotic change for age. No other significant atheromatous disease involving the major arterial vasculature of the head and neck. 3. Tortuosity of  the major arterial vasculature of the neck, suggesting chronic underlying hypertension. 4. Chronic T6 compression fracture as above. 5. Advanced cervical spondylolysis at C3-4 through C7-T1. Electronically Signed   By: Jeannine Boga M.D.   On: 08/05/2018 01:55     PHYSICAL EXAM Blood pressure (!) 141/72, pulse 89, temperature 98.6 F (37 C), temperature source Oral, resp. rate 16, height 4\' 11"  (1.499 m), weight 59 kg, SpO2 95 %.  Pleasant elderly lady currently not in distress. Afebrile. Head is nontraumatic. Neck is supple without bruit. Cardiac exam no murmur or gallop. Lungs are clear to auscultation. Distal pulses are well felt.  Neurological Exam :  Awake alert oriented x 2.  Diminished attention registration and recall.  Follows two-step commands.  Extraocular movements are full range without nystagmus.  Pupils equal reactive.  Blinks to threat bilaterally.  Face is symmetric without weakness.  Tongue midline.  Motor system exam reveals symmetric and equal strength in all 4 extremities without focal weakness.  Deep tendon reflexes appear symmetric plantars are downgoing.  Sensation appears intact.  She walked with PT in the hallway, minimal assistance, normal gait and stride, no tendency to fall.   ASSESSMENT/PLAN Ms. NAVEEN LORUSSO is a 77 y.o. female with history of dementia, hypertension, hypothyroidism, neuropathy, rheumatoid arthritis, history of UGI bleeding 2 months ago with negative EGD presenting with altered mental status, colitis and hypokalemia.  Stroke: Punctate bilateral anterior and posterior circulation infarcts - cardioembolic pattern - unknown source  Resultant mild lethargy  CT head - Small focal hypodensity in the right basal ganglia may reflect age indeterminate lacunar infarct.  MRI head - Multiple foci of scattered punctate infarcts in bilateral cerebral and cerebellar hemispheres. Multifocal areas of susceptibility, predominantly within the posterior  circulation.   CTA head and neck, unremarkable, no LVO.  Carotid Doppler unremarkable  2D Echo  - EF 60 - 65%. No cardiac source of emboli identified.   TEE unremarkable, no endocarditis, no PFO  Loop recorder planned, however patient not cooperative.  Recommend 30-day CardioNet  monitor as outpatient to rule out A. fib.  LDL - 33  HgbA1c - 5.0  VTE prophylaxis - Lovenox  Diet  - Heart healthy with thin liquids.  No antithrombotic prior to admission, now on Plavix.  Patient has aspirin allergy.  Continue Plavix on discharge.  Ongoing aggressive stroke risk factor management  Therapy recommendations: Home health PT  Disposition:  Pending  Hypertension  Stable . Permissive hypertension (OK if < 220/120) but gradually normalize in 5-7 days . Long-term BP goal normotensive   Other Stroke Risk Factors  Advanced age  Other Active Problems  Colitis -suggested on CTA abdomen and pelvis.  Treated with Cipro and Flagyl  Dementia -mild as per daughter, continued on Aricept and Namenda  Rheumatoid arthritis  Neuropathy  Mild esophageal stricture  Upper GI bleeding 2 months ago with negative EGD   Hospital day # 3  Neurology will sign off. Please call with questions. Pt will follow up with stroke clinic Dr. Leonie Man at [facility] in about 4 weeks. Thanks for the consult.  Rosalin Hawking, MD PhD Stroke Neurology 08/05/2018 5:45 PM    To contact Stroke Continuity provider, please refer to [email or web address] After hours, contact General Neurology

## 2018-08-05 NOTE — Progress Notes (Signed)
Ms [name] is non compliance  with care refuse  Complete assessment and all po medication.  Several attempts by nurse unsuccessful    daughter  who  Is  also  a nurse at bedside  tried but pt refused  . She tried but pt still refused.. Per Pt daughter Mother may be having sun downing or playing  temper tan drum because pt wants to go home. She follows some commands hx of dementia   RN will monitor pt closely.

## 2018-08-05 NOTE — Progress Notes (Signed)
Patient taken down at this time for TEE.  No s/s of distress noted.  Daughter following patient down to speak with MD and to sign consent.

## 2018-08-05 NOTE — Care Management Note (Addendum)
Case Management Note  Patient Details  Name: [name] MRN: [id number] Date of Birth: 07-26-1941  Subjective/Objective:    Pt admitted with a stroke. She is from home with daughter and son in law.  DME: 3 in 1, tub bench, walker No issues obtaining her medications.  Family is able to provide transportation.                Action/Plan: Pt discharging home with orders for [facility] services. CM provided choice and [facility] selected. Information faxed to [facility]. Family to provide transport home.  I have discussed the patient's current level of function related to stroke with the patient and daughter.  They acknowledge understanding of this and feel they can provide the level of care the patient will need at home.     Addendum (1600): [facility] not able to accept pt. CM informed daughter and she selected [facility]. Butch Penny with [facility] notified and accepted the referral.   Expected Discharge Date:                  Expected Discharge Plan:  [place]  In-House Referral:     Discharge planning Services  CM Consult  Post Acute Care Choice:  Home Health Choice offered to:  Patient, Adult Children  DME Arranged:    DME Agency:     HH Arranged:  RN, PT, OT HH Agency:  [facility]  Status of Service:  Completed, signed off  If discussed at [place] of Stay Meetings, dates discussed:    Additional Comments:  Pollie Friar, RN 08/05/2018, 3:25 PM

## 2018-08-05 NOTE — Consult Note (Addendum)
ELECTROPHYSIOLOGY CONSULT NOTE  Patient ID: [name] MRN: [id number], DOB/AGE: [birth year]   Admit date: 08/01/2018 Date of Consult: 08/05/2018  Primary Physician: Celene Squibb, MD Primary Cardiologist: none Reason for Consultation: Cryptogenic stroke ; recommendations regarding Implantable Loop Recorder, requested by Dr. Erlinda Hong  History of Present Illness [name] was admitted on 08/01/2018 with abdominal pain and worsening confusion, found with a colitis and stroke.   PMHx noted for dementia, HTN, hypothyroidism, RA, GIB/hemptysis 2 weeks ago, neuropathy Imaging demonstrated  Multiple foci of scattered punctate infarcts in bilateral cerebral and cerebellar hemispheres. Multifocal areas of susceptibility, predominantly within the posterior circulation.This consistent with previous punctate hemorrhage and suggests an underlying vasculitis or significant atherosclerotic small vessel disease .  she has undergone workup for stroke including echocardiogram and carotid dopplers/angio.  The patient has been monitored on telemetry which has demonstrated sinus rhythm with no arrhythmias.  Inpatient stroke work-up is to be completed with a TEE.   Echocardiogram this admission demonstrated   Study Conclusions  - Left ventricle: The cavity size was normal. Wall thickness was at   the upper limits of normal. Systolic function was normal. The   estimated ejection fraction was in the range of 60% to 65%. Wall   motion was normal; there were no regional wall motion   abnormalities. Doppler parameters are consistent with abnormal   left ventricular relaxation (grade 1 diastolic dysfunction). - Aortic valve: Mildly calcified annulus. Trileaflet. There was   trivial regurgitation. - Mitral valve: Mildly calcified annulus. There was trivial   regurgitation. - Left atrium: The atrium was mildly dilated. - Right atrium: Central venous pressure (est): 3 mm Hg. - Atrial septum: No defect or  patent foramen ovale was identified. - Tricuspid valve: There was trivial regurgitation. - Pulmonary arteries: PA peak pressure: 24 mm Hg (S). - Pericardium, extracardiac: A prominent pericardial fat pad was   present.  08/05/18 TEE LVEF 60-65% No LA/LAA thrombus or mass No ASD or PFO by color flow Doppler or Bubble study Mild MR, AR and TR Trivial PR  Lab work is reviewed.  The patient's daughter is at bedside (and is an Therapist, sports), reports her Mom as near baseline, which is mild memory problems only.  The patient offers very little at the time of our visit, defers to her daughter.  They deny any hx of Afib, no cardiac symptoms or history.  Prior to admission, the patient denies chest pain, shortness of breath, dizziness, palpitations, or syncope.  They are recovering from their stroke with plans to likely home at discharge.   Past Medical History:  Diagnosis Date  . Anxiety   . Arm fracture, left   . Breast cancer ([place])   . Bulging disc   . Cardioembolic stroke ([place]) 61/44/3154  . Confusion   . Depression   . Difficulty swallowing solids   . GERD (gastroesophageal reflux disease)   . Hypertension   . Hypothyroidism   . Invasive ductal carcinoma of left breast ([place]) 02/24/2014  . Neuropathy   . Osteoporosis   . 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 Rotator cuff disorder, right   . Scoliosis   . Shortness of breath dyspnea    "I have reactive airway disease".     Surgical History:  Past Surgical History:  Procedure Laterality Date  . ABDOMINAL HYSTERECTOMY    . Barbie Banner OSTEOTOMY Right 10/28/2014   Procedure: Barbie Banner OSTEOTOMY RIGHT FOOT;  Surgeon: Marcheta Grammes, DPM;  Location:  [facility];  Service: Podiatry;  Laterality: Right;  . BIOPSY  05/15/2018   Procedure: BIOPSY;  Surgeon: Rogene Houston, MD;  Location: [facility];  Service: Endoscopy;;  gastric  . BONE BIOPSY  09/29/2015   Procedure: BONE BIOPSY AND BONE CULTURE SECOND TOE RIGHT FOOT;  Surgeon: Caprice Beaver,  DPM;  Location: [facility];  Service: Podiatry;;  . BREAST SURGERY Left    lumpectomy and axillary lymph node with re-excision 2 weeks later   . BUNIONECTOMY Right 10/28/2014   Procedure: SILVER BUNIONECTOMY RIGHT FOOT;  Surgeon: Marcheta Grammes, DPM;  Location: [facility];  Service: Podiatry;  Laterality: Right;  . CATARACT EXTRACTION W/PHACO Left 08/10/2017   Procedure: CATARACT EXTRACTION PHACO AND INTRAOCULAR LENS PLACEMENT LEFT EYE;  Surgeon: Baruch Goldmann, MD;  Location: [facility];  Service: Ophthalmology;  Laterality: Left;  CDE: 13.81  . CATARACT EXTRACTION W/PHACO Right 09/07/2017   Procedure: CATARACT EXTRACTION PHACO AND INTRAOCULAR LENS PLACEMENT RIGHT EYE;  Surgeon: Baruch Goldmann, MD;  Location: [facility];  Service: Ophthalmology;  Laterality: Right;  right  . COLONOSCOPY    . DILATION AND CURETTAGE OF UTERUS    . ESOPHAGEAL DILATION N/A 05/15/2018   Procedure: ESOPHAGEAL DILATION;  Surgeon: Rogene Houston, MD;  [facility];  Service: Endoscopy;  Laterality: N/A;  . esophageal stricture    . ESOPHAGOGASTRODUODENOSCOPY N/A 05/15/2018   Procedure: ESOPHAGOGASTRODUODENOSCOPY (EGD);  Surgeon: Rogene Houston, MD;  [facility];  Service: Endoscopy;  Laterality: N/A;  1:45  . HAMMER TOE SURGERY Right 10/28/2014   Procedure: HAMMER TOE CORRECTION 2ND AND 3RD TOE RIGHT FOOT;  Surgeon: Marcheta Grammes, DPM;  Location: [facility];  Service: Podiatry;  Laterality: Right;  . LUMBAR LAMINECTOMY/DECOMPRESSION MICRODISCECTOMY Left 03/13/2016   Procedure: Left Lumbar Four-Five, Lumbar Five-Sacral One Laminotomy/Foraminotomy;  Surgeon: Kevan Ny Ditty, MD;  [facility]: [facility];  Service: Neurosurgery;  Laterality: Left;  . REMOVAL OF IMPLANT Right 09/29/2015   Procedure: REMOVAL OF IMPLANT 2ND TOE RIGHT FOOT;  Surgeon: Caprice Beaver, DPM;  Location: [facility];  Service: Podiatry;  Laterality: Right;     Medications Prior to Admission  Medication Sig Dispense Refill Last  Dose  . Calcium Carbonate-Vitamin D (CALTRATE 600+D) 600-400 MG-UNIT tablet Take 1 tablet by mouth 2 (two) times daily.   08/01/2018 at Unknown time  . cetirizine (ZYRTEC) 10 MG tablet Take 10 mg by mouth daily as needed for allergies.    08/01/2018 at Unknown time  . Cholecalciferol (VITAMIN D) 2000 UNITS CAPS Take 2,000 Units by mouth daily.    08/01/2018 at Unknown time  . donepezil (ARICEPT) 5 MG tablet Take 5 mg by mouth at bedtime.   08/01/2018 at Unknown time  . DULoxetine (CYMBALTA) 60 MG capsule Take 60 mg by mouth daily.   08/01/2018 at Unknown time  . folic acid (FOLVITE) 476 MCG tablet Take 800 mcg by mouth daily.   08/01/2018 at Unknown time  . Magnesium 500 MG CAPS Take 500 mg by mouth daily.    08/01/2018 at Unknown time  . memantine (NAMENDA) 10 MG tablet Take 10 mg by mouth 2 (two) times daily.   08/01/2018 at Unknown time  . methotrexate (RHEUMATREX) 2.5 MG tablet Take 22.5 mg by mouth once a week. Every Tuesday   Past Week at Unknown time  . OVER THE COUNTER MEDICATION Take 8 oz by mouth daily as needed (leg pain). Tonic Water with Quinine   Past Week at Unknown time  .  pantoprazole (PROTONIX) 40 MG tablet Take 1 tablet (40 mg total) by mouth daily. 90 tablet 3 08/01/2018 at Unknown time  . potassium chloride (K-DUR) 10 MEQ tablet Take 10 mEq by mouth at bedtime.   08/01/2018 at Unknown time  . Zinc 50 MG CAPS Take 50 mg by mouth daily.    08/01/2018 at Unknown time  . albuterol (PROAIR HFA) 108 (90 Base) MCG/ACT inhaler Inhale 2 puffs into the lungs every 6 (six) hours as needed for wheezing or shortness of breath.    unknown  . denosumab (PROLIA) 60 MG/ML SOLN injection Inject 60 mg into the skin every 6 (six) months. Administer in upper arm, thigh, or abdomen, got in June 2019   More than a month at Unknown time  . HYDROcodone-acetaminophen (NORCO/VICODIN) 5-325 MG tablet Take 1 tablet by mouth daily as needed for pain.   Past Month at Unknown time  . lisinopril  (PRINIVIL,ZESTRIL) 10 MG tablet Take 5 mg by mouth daily. Son says she takes it as needed when she feels her BP is high, she will check it and then take it if needed.   unknown  . senna (EQ NATURAL VEGETABLE LAXATIVE) 8.6 MG tablet Take 1 tablet by mouth daily as needed for constipation.   unknown    Inpatient Medications:  . ciprofloxacin  500 mg Oral BID  . clopidogrel  75 mg Oral Daily  . donepezil  5 mg Oral QHS  . DULoxetine  60 mg Oral Daily  . enoxaparin (LOVENOX) injection  40 mg Subcutaneous Q24H  . memantine  10 mg Oral BID  . metroNIDAZOLE  500 mg Oral Q8H  . pantoprazole  40 mg Oral Daily  . polyethylene glycol  17 g Oral Daily  . sodium chloride flush  3 mL Intravenous Q12H    Allergies:  Allergies  Allergen Reactions  . Celecoxib Swelling and Other (See Comments)  . Ibuprofen Swelling and Other (See Comments)  . Diphenhydramine Rash and Other (See Comments)    Wide awake.   Micheline Maze [Propoxyphene N-Acetaminophen] Nausea And Vomiting    Social History   Socioeconomic History  . Marital status: Widowed    Spouse name: Not on file  . Number of children: Not on file  . Years of education: Not on file  . Highest education level: Not on file  Occupational History  . Not on file  Social Needs  . Financial resource strain: Not on file  . Food insecurity:    Worry: Not on file    Inability: Not on file  . Transportation needs:    Medical: Not on file    Non-medical: Not on file  Tobacco Use  . Smoking status: Never Smoker  . Smokeless tobacco: Never Used  Substance and Sexual Activity  . Alcohol use: No  . Drug use: No  . Sexual activity: Never    Birth control/protection: None  Lifestyle  . Physical activity:    Days per week: Not on file    Minutes per session: Not on file  . Stress: Not on file  Relationships  . Social connections:    Talks on phone: Not on file    Gets together: Not on file    Attends religious service: Not on file    Active  member of club or organization: Not on file    Attends meetings of clubs or organizations: Not on file    Relationship status: Not on file  . Intimate partner violence:  Fear of current or ex partner: Not on file    Emotionally abused: Not on file    Physically abused: Not on file    Forced sexual activity: Not on file  Other Topics Concern  . Not on file  Social History Narrative  . Not on file     Family History  Problem Relation Age of Onset  . Hypertension Mother   . Cancer Maternal Grandmother   . Cancer Cousin       Review of Systems: All other systems reviewed and are otherwise negative except as noted above.  Physical Exam: Vitals:   08/05/18 0928 08/05/18 1101 08/05/18 1110 08/05/18 1130  BP: (!) 159/67 (!) 166/62 (!) 145/77 (!) 141/72  Pulse:  89 96 89  Resp:  20 17 16   Temp: 98.5 F (36.9 C) 98.1 F (36.7 C)  98.6 F (37 C)  TempSrc: Oral Oral  Oral  SpO2:  95% 94% 95%  Weight: 59 kg     Height: 4\' 11"  (1.499 m)       GEN- The patient is well appearing, alert and oriented x 3 today.   Head- normocephalic, atraumatic Eyes-  Sclera clear, conjunctiva pink Ears- hearing intact Oropharynx- clear Neck- supple Lungs- CTA b/l, normal work of breathing Heart- RRR, no murmurs, rubs or gallops  GI- soft, NT, ND Extremities- no clubbing, cyanosis, or edema MS- no significant deformity, age appropriate atrophy Skin- no rash or lesion Psych- euthymic mood, full affect   Labs:   Lab Results  Component Value Date   WBC 4.4 01-22-202019   HGB 10.3 (L) 01-22-202019   HCT 32.0 (L) 01-22-202019   MCV 94.7 01-22-202019   PLT 128 (L) 01-22-202019    Recent Labs  Lab 08/03/18 0503 08/04/18 0542  NA 133* 136  K 3.4* 3.5  CL 103 102  CO2 22 25  BUN 6* 7*  CREATININE 0.91 0.97  CALCIUM 8.2* 8.6*  PROT 5.3*  --   BILITOT 0.9  --   ALKPHOS 68  --   ALT 19  --   AST 30  --   GLUCOSE 83 98   Lab Results  Component Value Date   TROPONINI <0.03 08/01/2018    Lab Results  Component Value Date   CHOL 99 01-22-202019   Lab Results  Component Value Date   HDL 58 01-22-202019   Lab Results  Component Value Date   LDLCALC 33 01-22-202019   Lab Results  Component Value Date   TRIG 40 01-22-202019   Lab Results  Component Value Date   CHOLHDL 1.7 01-22-202019   No results found for: LDLDIRECT  No results found for: DDIMER   Radiology/Studies:   Ct Abdomen Pelvis Wo Contrast Result Date: 08/01/2018 CLINICAL DATA:  Initial evaluation for acute generalized abdominal pain. EXAM: CT ABDOMEN AND PELVIS WITHOUT CONTRAST TECHNIQUE: Multidetector CT imaging of the abdomen and pelvis was performed following the standard protocol without IV contrast. COMPARISON:  None available. FINDINGS: Lower chest: Mild scattered atelectatic changes present within the visualized lung bases, left greater than right. Visualized lungs are otherwise grossly clear. Scattered coronary artery calcifications partially visualized. Hepatobiliary: Limited noncontrast evaluation liver unremarkable. Gallbladder contracted without acute abnormality. No biliary dilatation. Pancreas: Pancreas within normal limits. Spleen: Spleen within normal limits. Adrenals/Urinary Tract: Adrenal glands unremarkable. Left kidney slightly atrophic as compared to the right. No nephrolithiasis or hydronephrosis. 14 mm cyst present within the interpolar right kidney. No appreciable radiopaque calculi seen along the course of either  ureter. No hydroureter. Partially distended bladder without acute abnormality. Small right-sided bladder diverticulum noted. Stomach/Bowel: Large hiatal hernia. Stomach otherwise unremarkable. No evidence for bowel obstruction. Hazy inflammatory stranding with wall thickening about the proximal ascending colon within the right abdomen, suspicious for possible acute colitis (series 2, image 43). No other acute inflammatory changes seen about the bowels. Appendix within normal limits. Few  scattered colonic diverticula noted. Vascular/Lymphatic: Tortuosity the intraabdominal aorta noted. Mild aortic atherosclerosis. No appreciable aneurysm. No intra-abdominal or pelvic adenopathy identified on this noncontrast examination. Reproductive: Uterus is absent. Normal right ovary. Left ovary not seen. Other: No free air or fluid. Musculoskeletal: Severe lumbar levoscoliosis with apex at L2-3. Associated severe multilevel degenerative spondylolysis with facet arthrosis. Chronic compression deformities noted at T12 and L1. No acute osseous abnormality. No discrete lytic or blastic osseous lesions. IMPRESSION: 1. Hazy inflammatory stranding with wall thickening about the proximal ascending colon, suspicious for acute colitis. No complication identified. 2. No other acute intra-abdominal or pelvic process. 3. Large hiatal hernia. 4. Mild sigmoid diverticulosis without evidence for acute diverticulitis. 5. Severe lumbar levoscoliosis with advanced multilevel degenerative spondylolysis and facet arthrosis. Electronically Signed   By: Jeannine Boga M.D.   On: 08/01/2018 23:30    Ct Angio Head W Or Wo Contrast Result Date: 08/05/2018 CLINICAL DATA:  Follow-up examination for acute stroke. EXAM: CT ANGIOGRAPHY HEAD AND NECK TECHNIQUE: Multidetector CT imaging of the head and neck was performed using the standard protocol during bolus administration of intravenous contrast. Multiplanar CT image reconstructions and MIPs were obtained to evaluate the vascular anatomy. Carotid stenosis measurements (when applicable) are obtained utilizing NASCET criteria, using the distal internal carotid diameter as the denominator. CONTRAST:  58mL ISOVUE-370 IOPAMIDOL (ISOVUE-370) INJECTION 76% COMPARISON:  Prior MRI from 08/02/2018 and CT from 08/01/2018 FINDINGS: CT HEAD FINDINGS Brain: Atrophy with chronic microvascular ischemic disease again noted. Previously identified small volume ischemic infarcts not visible by CT.  No other acute large vessel territory infarct. No acute intracranial hemorrhage. No mass lesion, midline shift or mass effect. No hydrocephalus. No extra-axial fluid collection. Vascular: No hyperdense vessel. Scattered vascular calcifications noted within the carotid siphons. Skull: Scalp soft tissues and calvarium within normal limits. Sinuses: Paranasal sinuses are largely clear.  No mastoid effusion. Orbits: Globes and orbital soft tissues demonstrate no acute finding. Review of the MIP images confirms the above findings CTA NECK FINDINGS Aortic arch: Examination mildly degraded by motion artifact. Visualized aortic arch of normal caliber with normal 3 vessel morphology. Mild atheromatous plaque within the proximal descending intrathoracic aorta. No flow-limiting stenosis about the origin of the great vessels. Visualized subclavian arteries widely patent. Right carotid system: Right common carotid artery tortuous proximally but widely patent to the bifurcation. No significant atheromatous narrowing about the right bifurcation. Right ICA widely patent from the bifurcation to the skull base without stenosis, dissection, or occlusion. Left carotid system: Left common carotid artery tortuous proximally but widely patent to the bifurcation. No significant atheromatous change or stenosis about the left bifurcation. Left ICA patent from the bifurcation to the skull base without stenosis, dissection or occlusion. Vertebral arteries: Both of the vertebral arteries arise from the subclavian arteries. Vertebral arteries are somewhat tortuous proximally but widely patent to the skull base without stenosis, dissection, or occlusion. Left vertebral artery dominant. Skeleton: No acute osseous abnormality. Chronic T6 compression fracture with up to approximately 40-50% height loss and 4 mm bony retropulsion. Severe cervical spondylolysis extending from C3-4 through C7-T1. Associated 3 mm retrolisthesis of  C5 on C6. Degenerative  changes noted about the left TMJ. No worrisome osseous lesions. Other neck: No other acute soft tissue abnormality within the neck. Upper chest: Visualized upper chest demonstrates no acute finding. Partially visualized lungs are clear. Review of the MIP images confirms the above findings CTA HEAD FINDINGS Anterior circulation: Petrous segments widely patent bilaterally. Mild smooth atheromatous plaque within the cavernous/supraclinoid ICAs with relatively mild diffuse narrowing. ICA termini widely patent. A1 segments patent bilaterally. Normal anterior communicating artery. Anterior cerebral arteries widely patent to their distal aspects without stenosis. M1 segments widely patent bilaterally. No proximal M2 occlusion. Distal MCA branches well perfused and symmetric. Posterior circulation: Vertebral arteries widely patent to the vertebrobasilar junction without stenosis. Left vertebral artery dominant. Patent left PICA. Right PICA not visualized. Dominant right AICA. Basilar artery widely patent to its distal aspect without stenosis. Superior cerebral arteries patent bilaterally. Left PCA supplied via the basilar. Hypoplastic right P1 with prominent right posterior communicating artery. PCAs well perfused to their distal aspects without stenosis. Venous sinuses: Grossly patent, although not well assessed due to arterial timing of the contrast bolus. Anatomic variants: None significant.  No intracranial aneurysm. Delayed phase: No abnormal enhancement. Review of the MIP images confirms the above findings IMPRESSION: 1. Negative CTA of the head and neck. No large vessel occlusion. No hemodynamically significant or correctable stenosis. 2. Minor carotid siphon atherosclerotic change for age. No other significant atheromatous disease involving the major arterial vasculature of the head and neck. 3. Tortuosity of the major arterial vasculature of the neck, suggesting chronic underlying hypertension. 4. Chronic T6  compression fracture as above. 5. Advanced cervical spondylolysis at C3-4 through C7-T1. Electronically Signed   By: Jeannine Boga M.D.   On: 08/05/2018 01:55    Dg Chest 1 View Result Date: 08/01/2018 CLINICAL DATA:  Altered mental status. EXAM: CHEST  1 VIEW COMPARISON:  Radiographs 07/13/2009 FINDINGS: Upper normal heart size. Moderate retrocardiac hiatal hernia. Pulmonary vasculature is normal. No consolidation, pleural effusion, or pneumothorax. No acute osseous abnormalities are seen. IMPRESSION: 1. No acute pulmonary process. 2. Moderate hiatal hernia. Electronically Signed   By: Keith Rake M.D.   On: 08/01/2018 22:50     Ct Head Wo Contrast Result Date: 08/01/2018 CLINICAL DATA:  Confusion EXAM: CT HEAD WITHOUT CONTRAST TECHNIQUE: Contiguous axial images were obtained from the base of the skull through the vertex without intravenous contrast. COMPARISON:  None. FINDINGS: Brain: No territorial infarction, hemorrhage or intracranial mass. Small focal hypodensity in the right basal ganglia. Atrophy with moderate small vessel ischemic changes of the white matter. Mildly prominent ventricles felt secondary to atrophy Vascular: No hyperdense vessels.  Carotid vascular calcification Skull: Normal. Negative for fracture or focal lesion. Sinuses/Orbits: No acute finding. Other: None IMPRESSION: 1. Small focal hypodensity in the right basal ganglia may reflect age indeterminate lacunar infarct. Negative for hemorrhage. 2. Atrophy and small vessel ischemic changes of the white matter. Electronically Signed   By: Donavan Foil M.D.   On: 08/01/2018 23:20    Mr Brain Wo Contrast Result Date: 08/02/2018 CLINICAL DATA:  Personal history of dementia with increased confusion over the last 3 days. Abnormal CT scan. EXAM: MRI HEAD WITHOUT CONTRAST TECHNIQUE: Multiplanar, multiecho pulse sequences of the brain and surrounding structures were obtained without intravenous contrast. COMPARISON:  CT  head without contrast same day. FINDINGS: Brain: 15-18 punctate foci of restricted diffusion are scattered throughout both hemispheres. There is involvement of the right hippocampus, within the posterior circulation. No acceptable  lesions are evident. At least 1 punctate focus of restricted diffusion is present posteriorly in each cerebellar hemisphere. Additional more remote lacunar infarcts are present in the inferior cerebellum bilaterally. Susceptibility weighted imaging demonstrates multiple foci susceptibility artifact in the cerebellum and posterior circulation in particular. There is more minimal susceptibility abnormality within the anterior territories. Acute hemorrhage is present. There is no mass lesion. The ventricles are of normal size. Periventricular white matter changes are evident bilaterally. Vascular: Flow is present in the major intracranial arteries. Skull and upper cervical spine: Skull base is within normal limits. The craniocervical junction is normal. Sinuses/Orbits: Polyp or mucous retention cyst is present inferiorly in the left maxillary sinus. Anterior ethmoid mucosal thickening is worse on the right. No fluid levels are present. There is some fluid in left mastoid air cells. No obstructing nasopharyngeal lesion is present. IMPRESSION: 1. Multiple foci of scattered punctate infarcts in bilateral cerebral and cerebellar hemispheres. This suggests a central cardiac source. Question arrhythmia. EKG yesterday demonstrated normal sinus rhythm. Aortic valve disease or atherosclerotic changes in the ascending aorta should also be considered. 2. Multifocal areas of susceptibility, predominantly within the posterior circulation. This consistent with previous punctate hemorrhage and suggests an underlying vasculitis or significant atherosclerotic small vessel disease. 3. Advanced atrophy and moderate white matter disease reflects the sequela of chronic microvascular ischemia. Electronically  Signed   By: San Morelle M.D.   On: 08/02/2018 09:01     Vas US Carotid Result Date: 08/05/2018 Carotid Arterial Duplex Study Indications: CVA. Limitations: patient tenderness to being touched Performing Technologist: Landry Mellow RDMS, RVT  Examination Guidelines: A complete evaluation includes B-mode imaging, spectral Doppler, color Doppler, and power Doppler as needed of all accessible portions of each vessel. Bilateral testing is considered an integral part of a complete examination. Limited examinations for reoccurring indications may be performed as noted.  -39%           intimal thickeningSummary: Right Carotid: Velocities in the right ICA are consistent with a 1-39% stenosis. Left Carotid: Velocities in the left ICA are consistent with a 1-39% stenosis. Vertebrals:  Bilateral vertebral arteries demonstrate antegrade flow. Subclavians: Normal flow hemodynamics were seen in bilateral subclavian              arteries. *See table(s) above for measurements and observations.  Electronically signed by Antony Contras MD on 08/05/2018 at 8:18:56 AM.    Final     12-lead ECG SR All prior EKG's in EPIC reviewed with no documented atrial fibrillation  Telemetry SR  Assessment and Plan:  1. Cryptogenic stroke The patient presents with cryptogenic stroke.  The patient has had a TEE without thrombus, PFO or endocarditis.  Dr. Curt Bears spoke at length with the patient and her daughter about monitoring for afib with either a 30 day event monitor or an implantable loop recorder.  Risks, benefits, and alteratives to implantable loop recorder were discussed with the patient today.   At this time, the patient defers to her daughter who would like to proceed with loop implant, the patient is agreeable to the plan.  In d/w Dr. Erlinda Hong, he feels the patient is well oriented, and from his standpoint ready to go home  Wound care was reviewed with the patient and daughter (keep incision clean and dry for 3 days).   Wound check is scheduled for the patient  Please call with questions.   Baldwin Jamaica, PA-C 08/05/2018  I have seen and examined this patient with Tommye Standard.  Agree with above, note added to reflect my findings.  On exam, RRR, no murmurs, lungs clear.  Patient presented to the hospital with cryptogenic stroke. To date, no cause has been found. TEE planned for today. If unrevealing, will plan for LINQ monitor to look for atrial fibrillation. Risks and benefits discussed. Risks include but not limited to bleeding and infection. The patient understands the risks and has agreed to the procedure.  Will M. Camnitz MD 08/05/2018 3:31 PM

## 2018-08-05 NOTE — Plan of Care (Signed)
  Problem: Education: Goal: Knowledge of General Education information will improve Description Including pain rating scale, medication(s)/side effects and non-pharmacologic comfort measures Outcome: Progressing   Problem: Health Behavior/Discharge Planning: Goal: Ability to manage health-related needs will improve Outcome: Progressing   Problem: Clinical Measurements: Goal: Ability to maintain clinical measurements within normal limits will improve Outcome: Progressing Goal: Will remain free from infection Outcome: Progressing Goal: Diagnostic test results will improve Outcome: Progressing Goal: Respiratory complications will improve Outcome: Progressing Goal: Cardiovascular complication will be avoided Outcome: Progressing   Problem: Activity: Goal: Risk for activity intolerance will decrease Outcome: Progressing   Problem: Nutrition: Goal: Adequate nutrition will be maintained Outcome: Progressing   Problem: Coping: Goal: Level of anxiety will decrease Outcome: Progressing   Problem: Elimination: Goal: Will not experience complications related to bowel motility Outcome: Progressing Goal: Will not experience complications related to urinary retention Outcome: Progressing   Problem: Pain Managment: Goal: General experience of comfort will improve Outcome: Progressing   Problem: Safety: Goal: Ability to remain free from injury will improve Outcome: Progressing   Problem: Skin Integrity: Goal: Risk for impaired skin integrity will decrease Outcome: Progressing   Problem: Education: Goal: Knowledge of General Education information will improve Description Including pain rating scale, medication(s)/side effects and non-pharmacologic comfort measures Outcome: Progressing   Problem: Health Behavior/Discharge Planning: Goal: Ability to manage health-related needs will improve Outcome: Progressing   Problem: Clinical Measurements: Goal: Ability to maintain  clinical measurements within normal limits will improve Outcome: Progressing Goal: Will remain free from infection Outcome: Progressing Goal: Diagnostic test results will improve Outcome: Progressing Goal: Respiratory complications will improve Outcome: Progressing Goal: Cardiovascular complication will be avoided Outcome: Progressing   Problem: Activity: Goal: Risk for activity intolerance will decrease Outcome: Progressing   Problem: Nutrition: Goal: Adequate nutrition will be maintained Outcome: Progressing   Problem: Coping: Goal: Level of anxiety will decrease Outcome: Progressing   Problem: Education: Goal: Knowledge of disease or condition will improve Outcome: Progressing Goal: Knowledge of secondary prevention will improve Outcome: Progressing Goal: Knowledge of patient specific risk factors addressed and post discharge goals established will improve Outcome: Progressing Goal: Individualized Educational Video(s) Outcome: Progressing   Problem: Health Behavior/Discharge Planning: Goal: Ability to manage health-related needs will improve Outcome: Progressing   Problem: Self-Care: Goal: Ability to participate in self-care as condition permits will improve Outcome: Progressing Goal: Verbalization of feelings and concerns over difficulty with self-care will improve Outcome: Progressing Goal: Ability to communicate needs accurately will improve Outcome: Progressing   Problem: Nutrition: Goal: Risk of aspiration will decrease Outcome: Progressing Goal: Dietary intake will improve Outcome: Progressing   Problem: Ischemic Stroke/TIA Tissue Perfusion: Goal: Complications of ischemic stroke/TIA will be minimized Outcome: Progressing

## 2018-08-05 NOTE — Progress Notes (Signed)
OT cancellation Note    08/05/18 1500  OT Visit Information  Last OT Received On 08/05/18  Reason Eval/Treat Not Completed Patient at procedure or test/ unavailable (having loop recorder placed)  Maurie Boettcher, OT/L   Acute OT Clinical Specialist [place] Pager [phone] Office [phone]

## 2018-08-05 NOTE — Progress Notes (Signed)
PT Cancellation Note  Patient Details Name: [name] MRN: [id number] DOB: September 22, 1940   Cancelled Treatment:    Reason Eval/Treat Not Completed: Patient at procedure or test/unavailable Will follow up as time allows.  Lanney Gins, PT, DPT Supplemental Physical Therapist 08/05/18 9:54 AM Pager: [phone] Office: [phone]

## 2018-08-05 NOTE — Progress Notes (Signed)
    [facility] has been requested to perform a transesophageal echocardiogram on [place] for CVA.  After careful review of history and examination, the risks and benefits of transesophageal echocardiogram have been explained including risks of esophageal damage, perforation (1:10,000 risk), bleeding, pharyngeal hematoma as well as other potential complications associated with conscious sedation including aspiration, arrhythmia, respiratory failure and death. Alternatives to treatment were discussed, questions were answered. Patient is willing to proceed.   Discussed with pt and daughter,  Pt does have esophageal strictures and last dilated 1.5 to 2 months ago in Sept 2019.  She has not had problems swallowing since that time, she does have GERD that increased also with Hiatal Hernia.   They were willing to proceed.  Dr. Oval Linsey notified of recent stricture and dilatation.   Cecilie Kicks, NP  08/05/2018 9:19 AM

## 2018-08-05 NOTE — Anesthesia Postprocedure Evaluation (Signed)
Anesthesia Post Note  Patient: [name]  Procedure(s) Performed: TRANSESOPHAGEAL ECHOCARDIOGRAM (TEE) (N/A ) BUBBLE STUDY     Patient location during evaluation: PACU Anesthesia Type: MAC Level of consciousness: awake and alert Pain management: pain level controlled Vital Signs Assessment: post-procedure vital signs reviewed and stable Respiratory status: spontaneous breathing, nonlabored ventilation and respiratory function stable Cardiovascular status: stable and blood pressure returned to baseline Anesthetic complications: no    Last Vitals:  Vitals:   08/05/18 1110 08/05/18 1130  BP: (!) 145/77 (!) 141/72  Pulse: 96 89  Resp: 17 16  Temp:  37 C  SpO2: 94% 95%    Last Pain:  Vitals:   08/05/18 1130  TempSrc: Oral  PainSc:                  Audry Pili

## 2018-08-05 NOTE — Discharge Instructions (Signed)
Implant site care instructions °Keep incision clean and dry for 3 days. °You can remove outer dressing tomorrow. °Leave steri-strips (little pieces of tape) on until seen in the office for wound check appointment. °Call the office ([phone]) for redness, drainage, swelling, or fever. ° °

## 2018-08-05 NOTE — Progress Notes (Signed)
Physical Therapy Treatment Patient Details Name: FELICIA BLOOMQUIST MRN: [id number] DOB: 1941/01/21 Today's Date: 08/05/2018    History of Present Illness Patient is a 77 year old female admitted 08/01/2018 with diagnosis acute colitis with MRI suggesting cardioembolic stroke with multiple areas of punctate infarcts. PMH: dementia, HTN, rheumatoid arthritis, history of falling.    PT Comments    Patient seen for mobility progression. Patient today requiring general min guard for all mobility with RW. Cueing for safety and sequencing, however no physical assist provided for steadying. Patient and family stating that patient will have 24 hr supervision at home at discharge, therefore d/c recs updated. Encouraged out of bed mobility with nursing staff. PT to continue to follow acutely.     Follow Up Recommendations  Home health PT;Supervision/Assistance - 24 hour     Equipment Recommendations  None recommended by PT    Recommendations for Other Services       Precautions / Restrictions Precautions Precautions: Fall Restrictions Weight Bearing Restrictions: No    Mobility  Bed Mobility Overal bed mobility: Needs Assistance Bed Mobility: Supine to Sit;Sit to Supine     Supine to sit: Min guard Sit to supine: Min guard   General bed mobility comments: min guard for safety; increased time and effort - no physical assist provided  Transfers Overall transfer level: Needs assistance Equipment used: Rolling walker (2 wheeled) Transfers: Sit to/from Stand Sit to Stand: Min assist;Min guard         General transfer comment: very light Min A to power up from bedside; cueing for safety and sequencing, hand placement  Ambulation/Gait Ambulation/Gait assistance: Min guard Gait Distance (Feet): 120 Feet Assistive device: Rolling walker (2 wheeled) Gait Pattern/deviations: Step-through pattern;Decreased stride length;Drifts right/left;Trunk flexed;Narrow base of support Gait  velocity: decreased   General Gait Details: cueing for safety with AD - cueing for sequencing and turning for safety    Stairs             Wheelchair Mobility    Modified Rankin (Stroke Patients Only) Modified Rankin (Stroke Patients Only) Pre-Morbid Rankin Score: No symptoms Modified Rankin: Moderately severe disability     Balance Overall balance assessment: Needs assistance Sitting-balance support: No upper extremity supported;Feet supported Sitting balance-Leahy Scale: Fair     Standing balance support: Bilateral upper extremity supported;During functional activity Standing balance-Leahy Scale: Poor Standing balance comment: reliant on RW for support with dynamic mobility                            Cognition Arousal/Alertness: Awake/alert Behavior During Therapy: WFL for tasks assessed/performed Overall Cognitive Status: Within Functional Limits for tasks assessed                                 General Comments: fearful of falling; very slow to speak adn very quiet      Exercises      General Comments General comments (skin integrity, edema, etc.): family present and supportive      Pertinent Vitals/Pain Pain Assessment: No/denies pain    Home Living                      Prior Function            PT Goals (current goals can now be found in the care plan section) Acute Rehab PT Goals Patient Stated Goal: regain independence  PT Goal Formulation: With patient/family Time For Goal Achievement: 08/16/18 Potential to Achieve Goals: Fair Progress towards PT goals: Progressing toward goals    Frequency    Min 3X/week      PT Plan Current plan remains appropriate;Discharge plan needs to be updated;Frequency needs to be updated    Co-evaluation              AM-PAC PT "6 Clicks" Mobility   Outcome Measure  Help needed turning from your back to your side while in a flat bed without using bedrails?: A  Little Help needed moving from lying on your back to sitting on the side of a flat bed without using bedrails?: A Little Help needed moving to and from a bed to a chair (including a wheelchair)?: A Little Help needed standing up from a chair using your arms (e.g., wheelchair or bedside chair)?: A Little Help needed to walk in hospital room?: A Little Help needed climbing 3-5 steps with a railing? : A Lot 6 Click Score: 17    End of Session Equipment Utilized During Treatment: Gait belt Activity Tolerance: Patient tolerated treatment well Patient left: in bed;with call bell/phone within reach;with family/visitor present Nurse Communication: Mobility status PT Visit Diagnosis: Unsteadiness on feet (R26.81);Other abnormalities of gait and mobility (R26.89);Muscle weakness (generalized) (M62.81)     Time: 8184-0375 PT Time Calculation (min) (ACUTE ONLY): 20 min  Charges:  $Gait Training: 8-22 mins                     Lanney Gins, PT, DPT Supplemental Physical Therapist 08/05/18 3:47 PM Pager: [phone] Office: [phone]

## 2018-08-05 NOTE — Transfer of Care (Signed)
Immediate Anesthesia Transfer of Care Note  Patient: [name]  Procedure(s) Performed: TRANSESOPHAGEAL ECHOCARDIOGRAM (TEE) (N/A ) BUBBLE STUDY  Patient Location: PACU  Anesthesia Type:MAC  Level of Consciousness: drowsy  Airway & Oxygen Therapy: Patient Spontanous Breathing and Patient connected to nasal cannula oxygen  Post-op Assessment: Report given to RN and Post -op Vital signs reviewed and stable  Post vital signs: Reviewed and stable  Last Vitals:  Vitals Value Taken Time  BP 166/62 08/05/2018 11:01 AM  Temp 36.7 C 08/05/2018 11:01 AM  Pulse 92 08/05/2018 11:02 AM  Resp 16 08/05/2018 11:02 AM  SpO2 95 % 08/05/2018 11:02 AM  Vitals shown include unvalidated device data.  Last Pain:  Vitals:   08/05/18 1101  TempSrc: Oral  PainSc:       Patients Stated Pain Goal: 0 (70/78/67 [id number])  Complications: No apparent anesthesia complications

## 2018-08-05 NOTE — CV Procedure (Signed)
Brief TEE Note  LVEF 60-65% No LA/LAA thrombus or mass No ASD or PFO by color flow Doppler or Bubble study Mild MR, AR and TR Trivial PR  For additional details see full report.  [place] Hafen C. Oval Linsey, MD, [facility] 08/05/2018 10:55 AM

## 2018-08-05 NOTE — Progress Notes (Signed)
PROGRESS NOTE    [name]  WUJ:[id number] DOB: [birth year] DOA: 08/01/2018 PCP: Celene Squibb, MD   Brief Narrative: Patient is a 77 year old female with past medical history of dementia, hypertension, rheumatoid arthritis who presented to the emergency department for the evaluation of 3-day history of lethargy, increased confusion and abdominal pain.  CT of the abdomen/pelvis revealed hazy inflammatory stranding around the proximal ascending colon with wall thickening suspicious for acute colitis.  MRI was also done for ongoing lethargy and confusion which revealed acute ischemic stroke.  Currently on antibiotics for colitis.  Neurology also following.  PT recommends skilled nursing facility.  Social worker consulted.Underwent TEE today to rule out septic emboli/endocarditis.  Assessment & Plan:   Principal Problem:   Cardioembolic stroke [facility]) Active Problems:   Rheumatoid arthritis ([place])   Acute colitis   Hypothyroidism   Hyponatremia   Hypokalemia   Infarction of right basal ganglia (H[facility])   Hypertension   Dementia ([place])   Acute cardioembolic stroke ([place])   Acute ischemic stroke: Most likely cardioembolic on etiology.  Neurology following.  MRI showed Multiple foci of scattered punctate infarcts in bilateral cerebral and cerebellar hemispheres.  Neurology suspected septic emboli.  Underwent  TEE today which showed LVEF 60-65%,No LA/LAA thrombus or mass,No ASD or PFO.Mild MR, AR and TR Blood cultures have been negative so far. 2D Echocardiogram showed ejection fraction of 66-65%, no wall motion abnormality.  She might also need prolonged cardiac monitoring to look for paroxysmal A. fib. Started on Plavix .She has  history of GI bleed. LDL of 33.  HbA1c of  5. Carotid doppler did not show significant carotid artery stenosis  Acute encephalopathy: Secondary to stroke,colitis,dementia.  Mental status has improved and is on baseline.  Acute colitis: Presented with 3-day history  of lethargy, abdominal pain.  Also found to have tenderness in the right lower and mid abdomen on presentation.  CT findings suggestive of ascending colitis.  Started on Cipro and Flagyl.  Abdominal pain has improved.Will change to oral today.  Hyponatremia: Improved  Hypokalemia: Supplemented with potassium.  Dementia: Continue Namenda and Aricept  Rheumatoid Arthritis: On methotrexate at home.  Weakness/deconditioning: PT evaluated the patient and recommended skilled nursing facility on discharge.  Social worker consulted.patient and daughter are reluctant for skilled nursing facility and want to go home.  Will wait until social worker communicates with the daughter and the patient.   DVT prophylaxis: Lovenox Code Status: Full Family Communication: Discussed with daughter at bed side Disposition Plan: Home with Home health vs SNF after full workup   Consultants: Neurology  Procedures:None  Antimicrobials: Cipro and Flagyl  Subjective: Patient seen and examined the bedside.  Hemodynamically stable.  Abdominal pain has improved .Daughter concerned about not having bowel movement.  Objective: Vitals:   08/05/18 0928 08/05/18 1101 08/05/18 1110 08/05/18 1130  BP: (!) 159/67 (!) 166/62 (!) 145/77 (!) 141/72  Pulse:  89 96 89  Resp:  20 17 16   Temp: 98.5 F (36.9 C) 98.1 F (36.7 C)  98.6 F (37 C)  TempSrc: Oral Oral  Oral  SpO2:  95% 94% 95%  Weight: 59 kg     Height: 4\' 11"  (1.499 m)       Intake/Output Summary (Last 24 hours) at 08/05/2018 1143 Last data filed at 08/05/2018 1102 Gross per 24 hour  Intake 200 ml  Output -  Net 200 ml   Filed Weights   08/01/18 2054 08/02/18 0102 08/05/18 0928  Weight: 57.1 kg  59 kg 59 kg    Examination:  General exam: Appears calm and comfortable ,Not in distress,average built,weak HEENT:PERRL,Oral mucosa moist, Ear/Nose normal on gross exam Respiratory system: Bilateral equal air entry, normal vesicular breath sounds, no  wheezes or crackles  Cardiovascular system: S1 & S2 heard, RRR. No JVD, murmurs, rubs, gallops or clicks. No pedal edema. Gastrointestinal system: Abdomen is nondistended, soft and nontender. No organomegaly or masses felt. Normal bowel sounds heard. Central nervous system: Alert and oriented. No focal neurological deficits. Extremities: No edema, no clubbing ,no cyanosis, distal peripheral pulses palpable. Skin: No rashes, lesions or ulcers,no icterus ,no pallor MSK: Normal muscle bulk,tone ,power     Data Reviewed: I have personally reviewed following labs and imaging studies  CBC: Recent Labs  Lab 08/01/18 2127 08/02/18 0506 08/03/18 0503  WBC 5.9 5.4 4.4  NEUTROABS 4.3 3.8  --   HGB 11.3* 10.5* 10.3*  HCT 35.6* 33.3* 32.0*  MCV 95.2 96.2 94.7  PLT 142* 142* 741*   Basic Metabolic Panel: Recent Labs  Lab 08/01/18 2127 08/02/18 0506 08/03/18 0503 08/04/18 0542  NA 133* 132* 133* 136  K 2.9* 3.0* 3.4* 3.5  CL 96* 101 103 102  CO2 28 25 22 25   GLUCOSE 118* 98 83 98  BUN 10 9 6* 7*  CREATININE 0.81 0.68 0.91 0.97  CALCIUM 9.2 8.7* 8.2* 8.6*  MG  --  1.7  --   --    GFR: Estimated Creatinine Clearance: 36.7 mL/min (by C-G formula based on SCr of 0.97 mg/dL). Liver Function Tests: Recent Labs  Lab 08/01/18 2127 08/03/18 0503  AST 39 30  ALT 23 19  ALKPHOS 92 68  BILITOT 0.8 0.9  PROT 6.5 5.3*  ALBUMIN 3.2* 2.5*   Recent Labs  Lab 08/01/18 2127  LIPASE 38   No results for input(s): AMMONIA in the last 168 hours. Coagulation Profile: Recent Labs  Lab 08/01/18 2127  INR 1.04   Cardiac Enzymes: Recent Labs  Lab 08/01/18 2127  TROPONINI <0.03   BNP (last 3 results) No results for input(s): PROBNP in the last 8760 hours. HbA1C: No results for input(s): HGBA1C in the last 72 hours. CBG: No results for input(s): GLUCAP in the last 168 hours. Lipid Profile: Recent Labs    08/03/18 0503  CHOL 99  HDL 58  LDLCALC 33  TRIG 40  CHOLHDL 1.7    Thyroid Function Tests: No results for input(s): TSH, T4TOTAL, FREET4, T3FREE, THYROIDAB in the last 72 hours. Anemia Panel: No results for input(s): VITAMINB12, FOLATE, FERRITIN, TIBC, IRON, RETICCTPCT in the last 72 hours. Sepsis Labs: Recent Labs  Lab 08/01/18 2127 08/02/18 0002  LATICACIDVEN 2.0* 1.9    Recent Results (from the past 240 hour(s))  Urine culture     Status: None   Collection Time: 08/01/18  9:50 PM  Result Value Ref Range Status   Specimen Description   Final    URINE, RANDOM Performed at [facility], [address], [place], [place] [address]    Special Requests   Final    NONE Performed at [facility], [address] [place], [place] [address]    Culture   Final    NO GROWTH Performed at [facility], [place] [address], [place], [place] [address]    Report Status 09/14/2017 FINAL  Final  Culture, blood (Routine X 2) w Reflex to ID Panel     Status: None (Preliminary result)   Collection Time: 08/03/18  9:07 AM  Result Value Ref Range Status   Specimen Description BLOOD RIGHT ANTECUBITAL  Final   Special Requests   Final    BOTTLES DRAWN AEROBIC AND ANAEROBIC Blood Culture adequate volume   Culture   Final    NO GROWTH 2 DAYS Performed at [facility], [address], [place], [place] [address]    Report Status PENDING  Incomplete  Culture, blood (Routine X 2) w Reflex to ID Panel     Status: None (Preliminary result)   Collection Time: 08/03/18  9:13 AM  Result Value Ref Range Status   Specimen Description BLOOD LEFT HAND  Final   Special Requests   Final    BOTTLES DRAWN AEROBIC AND ANAEROBIC Blood Culture adequate volume   Culture   Final    NO GROWTH 2 DAYS Performed at [facility], [place] [address], [place], [place] [address]    Report Status PENDING  Incomplete         Radiology Studies: Ct Angio Head W Or Wo Contrast  Result Date: 08/05/2018 CLINICAL DATA:  Follow-up examination for acute stroke. EXAM: CT  ANGIOGRAPHY HEAD AND NECK TECHNIQUE: Multidetector CT imaging of the head and neck was performed using the standard protocol during bolus administration of intravenous contrast. Multiplanar CT image reconstructions and MIPs were obtained to evaluate the vascular anatomy. Carotid stenosis measurements (when applicable) are obtained utilizing NASCET criteria, using the distal internal carotid diameter as the denominator. CONTRAST:  88mL ISOVUE-370 IOPAMIDOL (ISOVUE-370) INJECTION 76% COMPARISON:  Prior MRI from 08/02/2018 and CT from 08/01/2018 FINDINGS: CT HEAD FINDINGS Brain: Atrophy with chronic microvascular ischemic disease again noted. Previously identified small volume ischemic infarcts not visible by CT. No other acute large vessel territory infarct. No acute intracranial hemorrhage. No mass lesion, midline shift or mass effect. No hydrocephalus. No extra-axial fluid collection. Vascular: No hyperdense vessel. Scattered vascular calcifications noted within the carotid siphons. Skull: Scalp soft tissues and calvarium within normal limits. Sinuses: Paranasal sinuses are largely clear.  No mastoid effusion. Orbits: Globes and orbital soft tissues demonstrate no acute finding. Review of the MIP images confirms the above findings CTA NECK FINDINGS Aortic arch: Examination mildly degraded by motion artifact. Visualized aortic arch of normal caliber with normal 3 vessel morphology. Mild atheromatous plaque within the proximal descending intrathoracic aorta. No flow-limiting stenosis about the origin of the great vessels. Visualized subclavian arteries widely patent. Right carotid system: Right common carotid artery tortuous proximally but widely patent to the bifurcation. No significant atheromatous narrowing about the right bifurcation. Right ICA widely patent from the bifurcation to the skull base without stenosis, dissection, or occlusion. Left carotid system: Left common carotid artery tortuous proximally but  widely patent to the bifurcation. No significant atheromatous change or stenosis about the left bifurcation. Left ICA patent from the bifurcation to the skull base without stenosis, dissection or occlusion. Vertebral arteries: Both of the vertebral arteries arise from the subclavian arteries. Vertebral arteries are somewhat tortuous proximally but widely patent to the skull base without stenosis, dissection, or occlusion. Left vertebral artery dominant. Skeleton: No acute osseous abnormality. Chronic T6 compression fracture with up to approximately 40-50% height loss and 4 mm bony retropulsion. Severe cervical spondylolysis extending from C3-4 through C7-T1. Associated 3 mm retrolisthesis of C5 on C6. Degenerative changes noted about the left TMJ. No worrisome osseous lesions. Other neck: No other acute soft tissue abnormality within the neck. Upper chest: Visualized upper chest demonstrates no acute finding. Partially visualized lungs are clear.  Review of the MIP images confirms the above findings CTA HEAD FINDINGS Anterior circulation: Petrous segments widely patent bilaterally. Mild smooth atheromatous plaque within the cavernous/supraclinoid ICAs with relatively mild diffuse narrowing. ICA termini widely patent. A1 segments patent bilaterally. Normal anterior communicating artery. Anterior cerebral arteries widely patent to their distal aspects without stenosis. M1 segments widely patent bilaterally. No proximal M2 occlusion. Distal MCA branches well perfused and symmetric. Posterior circulation: Vertebral arteries widely patent to the vertebrobasilar junction without stenosis. Left vertebral artery dominant. Patent left PICA. Right PICA not visualized. Dominant right AICA. Basilar artery widely patent to its distal aspect without stenosis. Superior cerebral arteries patent bilaterally. Left PCA supplied via the basilar. Hypoplastic right P1 with prominent right posterior communicating artery. PCAs well perfused  to their distal aspects without stenosis. Venous sinuses: Grossly patent, although not well assessed due to arterial timing of the contrast bolus. Anatomic variants: None significant.  No intracranial aneurysm. Delayed phase: No abnormal enhancement. Review of the MIP images confirms the above findings IMPRESSION: 1. Negative CTA of the head and neck. No large vessel occlusion. No hemodynamically significant or correctable stenosis. 2. Minor carotid siphon atherosclerotic change for age. No other significant atheromatous disease involving the major arterial vasculature of the head and neck. 3. Tortuosity of the major arterial vasculature of the neck, suggesting chronic underlying hypertension. 4. Chronic T6 compression fracture as above. 5. Advanced cervical spondylolysis at C3-4 through C7-T1. Electronically Signed   By: Jeannine Boga M.D.   On: 08/05/2018 01:55   Ct Angio Neck W Or Wo Contrast  Result Date: 08/05/2018 CLINICAL DATA:  Follow-up examination for acute stroke. EXAM: CT ANGIOGRAPHY HEAD AND NECK TECHNIQUE: Multidetector CT imaging of the head and neck was performed using the standard protocol during bolus administration of intravenous contrast. Multiplanar CT image reconstructions and MIPs were obtained to evaluate the vascular anatomy. Carotid stenosis measurements (when applicable) are obtained utilizing NASCET criteria, using the distal internal carotid diameter as the denominator. CONTRAST:  71mL ISOVUE-370 IOPAMIDOL (ISOVUE-370) INJECTION 76% COMPARISON:  Prior MRI from 08/02/2018 and CT from 08/01/2018 FINDINGS: CT HEAD FINDINGS Brain: Atrophy with chronic microvascular ischemic disease again noted. Previously identified small volume ischemic infarcts not visible by CT. No other acute large vessel territory infarct. No acute intracranial hemorrhage. No mass lesion, midline shift or mass effect. No hydrocephalus. No extra-axial fluid collection. Vascular: No hyperdense vessel. Scattered  vascular calcifications noted within the carotid siphons. Skull: Scalp soft tissues and calvarium within normal limits. Sinuses: Paranasal sinuses are largely clear.  No mastoid effusion. Orbits: Globes and orbital soft tissues demonstrate no acute finding. Review of the MIP images confirms the above findings CTA NECK FINDINGS Aortic arch: Examination mildly degraded by motion artifact. Visualized aortic arch of normal caliber with normal 3 vessel morphology. Mild atheromatous plaque within the proximal descending intrathoracic aorta. No flow-limiting stenosis about the origin of the great vessels. Visualized subclavian arteries widely patent. Right carotid system: Right common carotid artery tortuous proximally but widely patent to the bifurcation. No significant atheromatous narrowing about the right bifurcation. Right ICA widely patent from the bifurcation to the skull base without stenosis, dissection, or occlusion. Left carotid system: Left common carotid artery tortuous proximally but widely patent to the bifurcation. No significant atheromatous change or stenosis about the left bifurcation. Left ICA patent from the bifurcation to the skull base without stenosis, dissection or occlusion. Vertebral arteries: Both of the vertebral arteries arise from the subclavian arteries. Vertebral arteries are somewhat tortuous proximally  but widely patent to the skull base without stenosis, dissection, or occlusion. Left vertebral artery dominant. Skeleton: No acute osseous abnormality. Chronic T6 compression fracture with up to approximately 40-50% height loss and 4 mm bony retropulsion. Severe cervical spondylolysis extending from C3-4 through C7-T1. Associated 3 mm retrolisthesis of C5 on C6. Degenerative changes noted about the left TMJ. No worrisome osseous lesions. Other neck: No other acute soft tissue abnormality within the neck. Upper chest: Visualized upper chest demonstrates no acute finding. Partially visualized  lungs are clear. Review of the MIP images confirms the above findings CTA HEAD FINDINGS Anterior circulation: Petrous segments widely patent bilaterally. Mild smooth atheromatous plaque within the cavernous/supraclinoid ICAs with relatively mild diffuse narrowing. ICA termini widely patent. A1 segments patent bilaterally. Normal anterior communicating artery. Anterior cerebral arteries widely patent to their distal aspects without stenosis. M1 segments widely patent bilaterally. No proximal M2 occlusion. Distal MCA branches well perfused and symmetric. Posterior circulation: Vertebral arteries widely patent to the vertebrobasilar junction without stenosis. Left vertebral artery dominant. Patent left PICA. Right PICA not visualized. Dominant right AICA. Basilar artery widely patent to its distal aspect without stenosis. Superior cerebral arteries patent bilaterally. Left PCA supplied via the basilar. Hypoplastic right P1 with prominent right posterior communicating artery. PCAs well perfused to their distal aspects without stenosis. Venous sinuses: Grossly patent, although not well assessed due to arterial timing of the contrast bolus. Anatomic variants: None significant.  No intracranial aneurysm. Delayed phase: No abnormal enhancement. Review of the MIP images confirms the above findings IMPRESSION: 1. Negative CTA of the head and neck. No large vessel occlusion. No hemodynamically significant or correctable stenosis. 2. Minor carotid siphon atherosclerotic change for age. No other significant atheromatous disease involving the major arterial vasculature of the head and neck. 3. Tortuosity of the major arterial vasculature of the neck, suggesting chronic underlying hypertension. 4. Chronic T6 compression fracture as above. 5. Advanced cervical spondylolysis at C3-4 through C7-T1. Electronically Signed   By: Jeannine Boga M.D.   On: 08/05/2018 01:55   Vas US Carotid  Result Date: 08/05/2018 Carotid  Arterial Duplex Study Indications: CVA. Limitations: patient tenderness to being touched Performing Technologist: Landry Mellow RDMS, RVT  Examination Guidelines: A complete evaluation includes B-mode imaging, spectral Doppler, color Doppler, and power Doppler as needed of all accessible portions of each vessel. Bilateral testing is considered an integral part of a complete examination. Limited examinations for reoccurring indications may be performed as noted.  Right Carotid Findings: +----------+--------+--------+--------+--------+------------------+           PSV cm/sEDV cm/sStenosisDescribeComments           +----------+--------+--------+--------+--------+------------------+ CCA Prox  144     22                                         +----------+--------+--------+--------+--------+------------------+ CCA Distal84      20                      intimal thickening +----------+--------+--------+--------+--------+------------------+ ICA Prox  114     28      1-39%           intimal thickening +----------+--------+--------+--------+--------+------------------+ ICA Distal137     28                                         +----------+--------+--------+--------+--------+------------------+  ECA       104     8                                          +----------+--------+--------+--------+--------+------------------+ +----------+--------+-------+----------------+-------------------+           PSV cm/sEDV cmsDescribe        Arm Pressure (mmHG) +----------+--------+-------+----------------+-------------------+ [id number]            Multiphasic, WNL                    +----------+--------+-------+----------------+-------------------+ +---------+--------+--+--------+--+---------+ VertebralPSV cm/s75EDV cm/s21Antegrade +---------+--------+--+--------+--+---------+  Left Carotid Findings: +----------+--------+--------+--------+--------+------------------+            PSV cm/sEDV cm/sStenosisDescribeComments           +----------+--------+--------+--------+--------+------------------+ CCA Prox  109     12                      tortuous           +----------+--------+--------+--------+--------+------------------+ CCA Distal87      16                      intimal thickening +----------+--------+--------+--------+--------+------------------+ ICA Prox  96      17      1-39%           intimal thickening +----------+--------+--------+--------+--------+------------------+ ICA Distal63      14                                         +----------+--------+--------+--------+--------+------------------+ ECA       110     12                                         +----------+--------+--------+--------+--------+------------------+ +----------+--------+--------+----------------+-------------------+ SubclavianPSV cm/sEDV cm/sDescribe        Arm Pressure (mmHG) +----------+--------+--------+----------------+-------------------+           135             Multiphasic, WNL                    +----------+--------+--------+----------------+-------------------+ +---------+--------+--+--------+--+---------+ VertebralPSV cm/s59EDV cm/s18Antegrade +---------+--------+--+--------+--+---------+  Summary: Right Carotid: Velocities in the right ICA are consistent with a 1-39% stenosis. Left Carotid: Velocities in the left ICA are consistent with a 1-39% stenosis. Vertebrals:  Bilateral vertebral arteries demonstrate antegrade flow. Subclavians: Normal flow hemodynamics were seen in bilateral subclavian              arteries. *See table(s) above for measurements and observations.  Electronically signed by Antony Contras MD on 08/05/2018 at 8:18:56 AM.    Final         Scheduled Meds: . clopidogrel  75 mg Oral Daily  . donepezil  5 mg Oral QHS  . DULoxetine  60 mg Oral Daily  . enoxaparin (LOVENOX) injection  40 mg Subcutaneous Q24H  .  memantine  10 mg Oral BID  . pantoprazole  40 mg Oral Daily  . sodium chloride flush  3 mL Intravenous Q12H   Continuous Infusions: . ciprofloxacin 400 mg (08/05/18 0116)  . metronidazole 500 mg (08/05/18 0817)     LOS: 3 days    Time  spent: 35 mins.More than 50% of that time was spent in counseling and/or coordination of care.      Shelly Coss, MD [facility] Pager [phone]  If 7PM-7AM, please contact night-coverage [email or web address] Password [facility] 08/05/2018, 11:43 AM

## 2018-08-05 NOTE — Anesthesia Preprocedure Evaluation (Addendum)
Anesthesia Evaluation  Patient identified by MRN, date of birth, ID band Patient confused    Reviewed: Allergy & Precautions, NPO status , Patient's Chart, lab work & pertinent test results  History of Anesthesia Complications Negative for: history of anesthetic complications  Airway Mallampati: II  TM Distance: >3 FB Neck ROM: Full    Dental  (+) Dental Advisory Given, Loose, Chipped   Pulmonary neg pulmonary ROS,    breath sounds clear to auscultation       Cardiovascular hypertension, Pt. on medications (-) angina Rhythm:Regular Rate:Normal   '19 Carotid US - 1-39% b/l ICAS  '19 TTE - EF 60% to 65%. Grade 1 diastolic dysfunction. Trivial AI, MR, TR. LA was mildly dilated. PA peak pressure: 24 mmHg. A prominent pericardial fat pad was present.     Neuro/Psych PSYCHIATRIC DISORDERS Anxiety Depression Dementia  Neuromuscular disease (neuropathy) CVA (confusion]), Residual Symptoms    GI/Hepatic Neg liver ROS, GERD  Medicated, Dysphagia with solids s/p multiple esophageal dilatations - cardiologist aware     Endo/Other  Hypothyroidism  Hypocalcemia   Renal/GU negative Renal ROS     Musculoskeletal  (+) Arthritis , Rheumatoid disorders,   Scoliosis    Abdominal   Peds  Hematology  (+) anemia ,  Thrombocytopenia    Anesthesia Other Findings   Reproductive/Obstetrics                           Anesthesia Physical Anesthesia Plan  ASA: IV  Anesthesia Plan: MAC   Post-op Pain Management:    Induction: Intravenous  PONV Risk Score and Plan: 2 and Propofol infusion and Treatment may vary due to age or medical condition  Airway Management Planned: Nasal Cannula and Natural Airway  Additional Equipment: None  Intra-op Plan:   Post-operative Plan:   Informed Consent: I have reviewed the patients History and Physical, chart, labs and discussed the procedure including the  risks, benefits and alternatives for the proposed anesthesia with the patient or authorized representative who has indicated his/her understanding and acceptance.   Consent reviewed with POA and Dental advisory given  Plan Discussed with: CRNA and Anesthesiologist  Anesthesia Plan Comments:       Anesthesia Quick Evaluation

## 2018-08-06 ENCOUNTER — Encounter (HOSPITAL_COMMUNITY): Payer: Self-pay | Admitting: Cardiovascular Disease

## 2018-08-06 ENCOUNTER — Other Ambulatory Visit: Payer: Self-pay | Admitting: Nurse Practitioner

## 2018-08-06 DIAGNOSIS — I639 Cerebral infarction, unspecified: Secondary | ICD-10-CM

## 2018-08-06 MED ORDER — CLOPIDOGREL BISULFATE 75 MG PO TABS: 75.0000 mg | ORAL_TABLET | Freq: Every day | ORAL | 0 refills | Status: AC

## 2018-08-06 MED ORDER — POLYETHYLENE GLYCOL 3350 17 G PO PACK: 17.0000 g | PACK | Freq: Every day | ORAL | 0 refills | Status: DC

## 2018-08-06 MED ORDER — METRONIDAZOLE 500 MG PO TABS: 500.0000 mg | ORAL_TABLET | Freq: Three times a day (TID) | ORAL | 0 refills | Status: DC

## 2018-08-06 MED ORDER — CIPROFLOXACIN HCL 500 MG PO TABS: 500.0000 mg | ORAL_TABLET | Freq: Two times a day (BID) | ORAL | 0 refills | Status: DC

## 2018-08-06 NOTE — Progress Notes (Signed)
Occupational Therapy Treatment Patient Details Name: [name] MRN: [id number] DOB: 08/23/1941 Today's Date: 08/06/2018    History of present illness Patient is a 77 year old female admitted 08/01/2018 with diagnosis acute colitis with MRI suggesting cardioembolic stroke with multiple areas of punctate infarcts. PMH: dementia, HTN, rheumatoid arthritis, history of falling.   OT comments  Pt making good progress with adls requiring extra time to complete them and overall min assist. Pt can do a lot for herself if encouraged and given the time to do for herself and not rushed.  Pt's daughter in room during session.  Rec HHOT as family has chosen to take pt home.  Making good progress toward all goals.   Follow Up Recommendations  Home health OT;Supervision/Assistance - 24 hour    Equipment Recommendations  3 in 1 bedside commode    Recommendations for Other Services      Precautions / Restrictions Precautions Precautions: Fall Restrictions Weight Bearing Restrictions: No       Mobility Bed Mobility Overal bed mobility: Needs Assistance Bed Mobility: Supine to Sit;Sit to Supine     Supine to sit: Min guard Sit to supine: Min guard   General bed mobility comments: min guard for safety; increased time and effort - no physical assist provided  Transfers Overall transfer level: Needs assistance Equipment used: Rolling walker (2 wheeled) Transfers: Sit to/from [facility] Sit to Stand: Min assist Stand pivot transfers: Min guard       General transfer comment: very light Min A to power up from bedside; cueing for safety and sequencing, hand placement    Balance Overall balance assessment: Needs assistance Sitting-balance support: No upper extremity supported;Feet supported Sitting balance-Leahy Scale: Good     Standing balance support: Bilateral upper extremity supported;During functional activity Standing balance-Leahy Scale: Fair Standing  balance comment: Pt is reliant on RW for dynamic tasks but pt could stand at sink without walker and pulled pants up letting go of walker with both hands.                            ADL either performed or assessed with clinical judgement   ADL Overall ADL's : Needs assistance/impaired Eating/Feeding: Sitting;Set up   Grooming: Wash/dry hands;Wash/dry face;Oral care;Min guard;Cueing for sequencing;Standing Grooming Details (indicate cue type and reason): Pt stood at sink for 8 minutes with no loss of balance. Pt required cues to use toothpaste, turn off the water and cues for initiation of each different grooming task.  No physical assist needed.         Upper Body Dressing : Minimal assistance;Sitting Upper Body Dressing Details (indicate cue type and reason): Pt donned shirt to go home in requiring assist to make sure shirt did not get caught on IV. Lower Body Dressing: Minimal assistance;Sit to/from stand;Cueing for sequencing Lower Body Dressing Details (indicate cue type and reason): Pt dressed self with min assist only to stand to pull pants up. Pt was able to start underwear, pants, socks and shoes without physical assist. Pt does get distracted at times and needs cues to initiate and stay on task. Toilet Transfer: Minimal assistance;Ambulation;Grab bars;BSC;Regular Toilet;RW Toilet Transfer Details (indicate cue type and reason): Pt walked to bathroom with cues to find door and cues to reach back for toilet. Toileting- Water quality scientist and Hygiene: Min guard;Sit to/from stand       Functional mobility during ADLs: Min guard;Rolling walker;Cueing for safety General ADL Comments:  Pt doing much better with adls today and less fearful of falling. pt asked daughter to do adls for her but was able to do each one of them without physical assist.     Vision       Perception     Praxis      Cognition Arousal/Alertness: Awake/alert Behavior During Therapy: WFL for  tasks assessed/performed Overall Cognitive Status: History of cognitive impairments - at baseline                                 General Comments: more interactive today, talking and following all commands.        Exercises     Shoulder Instructions       General Comments Pt much improved over last visit.    Pertinent Vitals/ Pain       Pain Assessment: Faces Faces Pain Scale: Hurts little more Pain Location: abdomen and back Pain Descriptors / Indicators: Discomfort Pain Intervention(s): Limited activity within patient's tolerance;Repositioned  Home Living                                          Prior Functioning/Environment              Frequency  Min 2X/week        Progress Toward Goals  OT Goals(current goals can now be found in the care plan section)  Progress towards OT goals: Progressing toward goals  Acute Rehab OT Goals Patient Stated Goal: regain independence OT Goal Formulation: With family Time For Goal Achievement: 08/17/18 Potential to Achieve Goals: Good ADL Goals Pt Will Transfer to Toilet: with min guard assist;ambulating;bedside commode Pt Will Perform Toileting - Clothing Manipulation and hygiene: with min assist;sit to/from stand Additional ADL Goal #1: pt will perform UB adls with set up sitting and no more than 1 vc per activity Additional ADL Goal #2: Pt will perform LB adls with min A, no more than 1 vc per activity Additional ADL Goal #3: pt will follow commands within 10 seconds of cue  Plan Discharge plan needs to be updated    Co-evaluation                 AM-PAC OT "6 Clicks" Daily Activity     Outcome Measure   Help from another person eating meals?: A Little Help from another person taking care of personal grooming?: A Little Help from another person toileting, which includes using toliet, bedpan, or urinal?: A Little Help from another person bathing (including washing, rinsing,  drying)?: A Little Help from another person to put on and taking off regular upper body clothing?: A Little Help from another person to put on and taking off regular lower body clothing?: A Little 6 Click Score: 18    End of Session Equipment Utilized During Treatment: Rolling walker  OT Visit Diagnosis: Unsteadiness on feet (R26.81);Muscle weakness (generalized) (M62.81)   Activity Tolerance Patient tolerated treatment well   Patient Left in bed;with call bell/phone within reach;with family/visitor present   Nurse Communication Mobility status        Time: 9528-4132 OT Time Calculation (min): 34 min  Charges: OT General Charges $OT Visit: 1 Visit OT Treatments $Self Care/Home Management : 23-37 mins  Jinger Neighbors, OTR/L [phone]   Glenford Peers 08/06/2018, 11:53 AM

## 2018-08-06 NOTE — Care Management Note (Signed)
Case Management Note  Patient Details  Name: [name] MRN: [id number] Date of Birth: [birth year]  Subjective/Objective:                    Action/Plan: Pt discharging home with family. [facility] aware of d/c. Daughter to provide transport home.   Expected Discharge Date:  08/06/18               Expected Discharge Plan:  [place]  In-House Referral:     Discharge planning Services  CM Consult  Post Acute Care Choice:  Home Health Choice offered to:  Patient, Adult Children  DME Arranged:    DME Agency:     HH Arranged:  RN, PT, OT HH Agency:  [facility]  Status of Service:  Completed, signed off  If discussed at [place] of Stay Meetings, dates discussed:    Additional Comments:  Pollie Friar, RN 08/06/2018, 12:09 PM

## 2018-08-06 NOTE — Discharge Summary (Signed)
Physician Discharge Summary  [name]:[id number] DOB: [birth year] DOA: 08/01/2018  PCP: Celene Squibb, MD  Admit date: 08/01/2018 Discharge date: 08/06/2018  Admitted From: Home Disposition:  Home  Discharge Condition:Stable CODE STATUS:FULL Diet recommendation: Heart Healthy  Brief/Interim Summary:  Patient is a 77 year old female with past medical history of dementia, hypertension, rheumatoid arthritis who presented to the emergency department for the evaluation of 3-day history of lethargy, increased confusion and abdominal pain.  CT of the abdomen/pelvis revealed hazy inflammatory stranding around the proximal ascending colon with wall thickening suspicious for acute colitis.  MRI was also done for ongoing lethargy and confusion which revealed acute ischemic stroke. Neurology also following.  She was started on antibiotics for colitis.  Her abdominal pain resolved.  She also underwent TEE to rule out infective endocarditis which  came out to be negative.   Patient was recommended loop recorder placement and was seen by EP and was tried during this hospitalization but it was unsuccessful.  She will follow-up with cardiology as an outpatient for 30-day event monitoring .PT recommended skilled nursing facility but she and  her family want to go home.  Home health arranged.  She is stable for discharge today.   Following problems were addressed during hospitalization:    Acute ischemic stroke:   Neurology following.  MRI showed Multiple foci of scattered punctate infarcts in bilateral cerebral and cerebellar hemispheres.  Neurology suspected septic emboli.  Underwent  TEE today which showed LVEF 60-65%,No LA/LAA thrombus or mass,No ASD or PFO.Mild MR, AR and TR Blood cultures have been negative so far. 2D Echocardiogram showed ejection fraction of 66-65%, no wall motion abnormality.  She was considered  prolonged cardiac monitoring to look for paroxysmal A. fib. Started on Plavix  .She has  history of GI bleed. LDL of 33.  HbA1c of  5. Carotid doppler did not show significant carotid artery stenosis. Patient was recommended loop recorder placement and was seen by EP and was tried during this hospitalization but it was unsuccessful.  She will follow-up with cardiology as an outpatient for 30-day event monitoring   Acute encephalopathy: Secondary to stroke,colitis,dementia.  Mental status has improved and is on baseline.  Acute colitis: Presented with 3-day history of lethargy, abdominal pain.  Also found to have tenderness in the right lower and mid abdomen on presentation.  CT findings suggestive of ascending colitis.  Started on Cipro and Flagyl.  Abdominal pain has improved.Change antibiotics to oral.  Hyponatremia: Improved  Hypokalemia: Supplemented with potassium.  Dementia: Continue Namenda and Aricept  Rheumatoid Arthritis: On methotrexate at home.  Weakness/deconditioning: PT evaluated the patient and recommended skilled nursing facility on discharge.  Social worker consulted.patient and daughter are reluctant for skilled nursing facility and want to go home.  Discharge Diagnoses:  Principal Problem:   Cardioembolic stroke [facility]) Active Problems:   Rheumatoid arthritis ([name])   Acute colitis   Hypothyroidism   Hyponatremia   Hypokalemia   Infarction of right basal ganglia (HCC)   Hypertension   Dementia ([name])   Acute cardioembolic stroke [facility])    Discharge Instructions  Discharge Instructions    Ambulatory referral to Neurology   Complete by:  As directed    Follow up with Dr. Leonie Man at [facility] in 4-6 weeks. Too complicated for RN to follow. Thanks.   Diet - low sodium heart healthy   Complete by:  As directed    Discharge instructions   Complete by:  As directed    1)  Take prescribed medications as instructed. 2) Follow up with neurology, cardiology( for 30 days event monitoring ) as an outpatient.   Increase activity slowly   Complete  by:  As directed      Allergies as of 08/06/2018      Reactions   Celecoxib Swelling, Other (See Comments)   Ibuprofen Swelling, Other (See Comments)   Diphenhydramine Rash, Other (See Comments)   Wide awake.    Wygesic [propoxyphene N-acetaminophen] Nausea And Vomiting      Medication List    TAKE these medications   CALTRATE 600+D 600-400 MG-UNIT tablet Generic drug:  Calcium Carbonate-Vitamin D Take 1 tablet by mouth 2 (two) times daily.   cetirizine 10 MG tablet Commonly known as:  ZYRTEC Take 10 mg by mouth daily as needed for allergies.   ciprofloxacin 500 MG tablet Commonly known as:  CIPRO Take 1 tablet (500 mg total) by mouth 2 (two) times daily.   clopidogrel 75 MG tablet Commonly known as:  PLAVIX Take 1 tablet (75 mg total) by mouth daily. Start taking on:  08/07/2018   denosumab 60 MG/ML Soln injection Commonly known as:  PROLIA Inject 60 mg into the skin every 6 (six) months. Administer in upper arm, thigh, or abdomen, got in June 2019   donepezil 5 MG tablet Commonly known as:  ARICEPT Take 5 mg by mouth at bedtime.   DULoxetine 60 MG capsule Commonly known as:  CYMBALTA Take 60 mg by mouth daily.   EQ NATURAL VEGETABLE LAXATIVE 8.6 MG tablet Generic drug:  senna Take 1 tablet by mouth daily as needed for constipation.   folic acid 035 MCG tablet Commonly known as:  FOLVITE Take 800 mcg by mouth daily.   HYDROcodone-acetaminophen 5-325 MG tablet Commonly known as:  NORCO/VICODIN Take 1 tablet by mouth daily as needed for pain.   lisinopril 10 MG tablet Commonly known as:  PRINIVIL,ZESTRIL Take 5 mg by mouth daily. Son says she takes it as needed when she feels her BP is high, she will check it and then take it if needed.   Magnesium 500 MG Caps Take 500 mg by mouth daily.   memantine 10 MG tablet Commonly known as:  NAMENDA Take 10 mg by mouth 2 (two) times daily.   methotrexate 2.5 MG tablet Commonly known as:  RHEUMATREX Take 22.5  mg by mouth once a week. Every Tuesday   metroNIDAZOLE 500 MG tablet Commonly known as:  FLAGYL Take 1 tablet (500 mg total) by mouth every 8 (eight) hours.   OVER THE COUNTER MEDICATION Take 8 oz by mouth daily as needed (leg pain). Tonic Water with Quinine   pantoprazole 40 MG tablet Commonly known as:  PROTONIX Take 1 tablet (40 mg total) by mouth daily.   polyethylene glycol packet Commonly known as:  MIRALAX / GLYCOLAX Take 17 g by mouth daily. Start taking on:  08/07/2018   potassium chloride 10 MEQ tablet Commonly known as:  K-DUR Take 10 mEq by mouth at bedtime.   PROAIR HFA 108 (90 Base) MCG/ACT inhaler Generic drug:  albuterol Inhale 2 puffs into the lungs every 6 (six) hours as needed for wheezing or shortness of breath.   Vitamin D 50 MCG (2000 UT) Caps Take 2,000 Units by mouth daily.   Zinc 50 MG Caps Take 50 mg by mouth daily.      Follow-up Information    [facility].   Specialty:  Cardiology Why:  08/15/18 @  4:00PM, wound check visit Contact information: [address] [place] Follow up.   Why:  They will contact you for the first appointment Contact information: [address] [place] [address] [phone]        Garvin Fila, MD. Schedule an appointment as soon as possible for a visit in 4 week(s).   Specialties:  Neurology, Radiology Contact information: [address] [place] [address] [phone]          Allergies  Allergen Reactions  . Celecoxib Swelling and Other (See Comments)  . Ibuprofen Swelling and Other (See Comments)  . Diphenhydramine Rash and Other (See Comments)    Wide awake.   Micheline Maze [Propoxyphene N-Acetaminophen] Nausea And Vomiting    Consultations:  Neurology,EP   Procedures/Studies: Ct Abdomen Pelvis Wo Contrast  Result Date:  08/01/2018 CLINICAL DATA:  Initial evaluation for acute generalized abdominal pain. EXAM: CT ABDOMEN AND PELVIS WITHOUT CONTRAST TECHNIQUE: Multidetector CT imaging of the abdomen and pelvis was performed following the standard protocol without IV contrast. COMPARISON:  None available. FINDINGS: Lower chest: Mild scattered atelectatic changes present within the visualized lung bases, left greater than right. Visualized lungs are otherwise grossly clear. Scattered coronary artery calcifications partially visualized. Hepatobiliary: Limited noncontrast evaluation liver unremarkable. Gallbladder contracted without acute abnormality. No biliary dilatation. Pancreas: Pancreas within normal limits. Spleen: Spleen within normal limits. Adrenals/Urinary Tract: Adrenal glands unremarkable. Left kidney slightly atrophic as compared to the right. No nephrolithiasis or hydronephrosis. 14 mm cyst present within the interpolar right kidney. No appreciable radiopaque calculi seen along the course of either ureter. No hydroureter. Partially distended bladder without acute abnormality. Small right-sided bladder diverticulum noted. Stomach/Bowel: Large hiatal hernia. Stomach otherwise unremarkable. No evidence for bowel obstruction. Hazy inflammatory stranding with wall thickening about the proximal ascending colon within the right abdomen, suspicious for possible acute colitis (series 2, image 43). No other acute inflammatory changes seen about the bowels. Appendix within normal limits. Few scattered colonic diverticula noted. Vascular/Lymphatic: Tortuosity the intraabdominal aorta noted. Mild aortic atherosclerosis. No appreciable aneurysm. No intra-abdominal or pelvic adenopathy identified on this noncontrast examination. Reproductive: Uterus is absent. Normal right ovary. Left ovary not seen. Other: No free air or fluid. Musculoskeletal: Severe lumbar levoscoliosis with apex at L2-3. Associated severe multilevel degenerative  spondylolysis with facet arthrosis. Chronic compression deformities noted at T12 and L1. No acute osseous abnormality. No discrete lytic or blastic osseous lesions. IMPRESSION: 1. Hazy inflammatory stranding with wall thickening about the proximal ascending colon, suspicious for acute colitis. No complication identified. 2. No other acute intra-abdominal or pelvic process. 3. Large hiatal hernia. 4. Mild sigmoid diverticulosis without evidence for acute diverticulitis. 5. Severe lumbar levoscoliosis with advanced multilevel degenerative spondylolysis and facet arthrosis. Electronically Signed   By: Jeannine Boga M.D.   On: 08/01/2018 23:30   Ct Angio Head W Or Wo Contrast  Result Date: 08/05/2018 CLINICAL DATA:  Follow-up examination for acute stroke. EXAM: CT ANGIOGRAPHY HEAD AND NECK TECHNIQUE: Multidetector CT imaging of the head and neck was performed using the standard protocol during bolus administration of intravenous contrast. Multiplanar CT image reconstructions and MIPs were obtained to evaluate the vascular anatomy. Carotid stenosis measurements (when applicable) are obtained utilizing NASCET criteria, using the distal internal carotid diameter as the denominator. CONTRAST:  50mL ISOVUE-370 IOPAMIDOL (ISOVUE-370) INJECTION 76% COMPARISON:  Prior MRI from 08/02/2018 and CT from  08/01/2018 FINDINGS: CT HEAD FINDINGS Brain: Atrophy with chronic microvascular ischemic disease again noted. Previously identified small volume ischemic infarcts not visible by CT. No other acute large vessel territory infarct. No acute intracranial hemorrhage. No mass lesion, midline shift or mass effect. No hydrocephalus. No extra-axial fluid collection. Vascular: No hyperdense vessel. Scattered vascular calcifications noted within the carotid siphons. Skull: Scalp soft tissues and calvarium within normal limits. Sinuses: Paranasal sinuses are largely clear.  No mastoid effusion. Orbits: Globes and orbital soft tissues  demonstrate no acute finding. Review of the MIP images confirms the above findings CTA NECK FINDINGS Aortic arch: Examination mildly degraded by motion artifact. Visualized aortic arch of normal caliber with normal 3 vessel morphology. Mild atheromatous plaque within the proximal descending intrathoracic aorta. No flow-limiting stenosis about the origin of the great vessels. Visualized subclavian arteries widely patent. Right carotid system: Right common carotid artery tortuous proximally but widely patent to the bifurcation. No significant atheromatous narrowing about the right bifurcation. Right ICA widely patent from the bifurcation to the skull base without stenosis, dissection, or occlusion. Left carotid system: Left common carotid artery tortuous proximally but widely patent to the bifurcation. No significant atheromatous change or stenosis about the left bifurcation. Left ICA patent from the bifurcation to the skull base without stenosis, dissection or occlusion. Vertebral arteries: Both of the vertebral arteries arise from the subclavian arteries. Vertebral arteries are somewhat tortuous proximally but widely patent to the skull base without stenosis, dissection, or occlusion. Left vertebral artery dominant. Skeleton: No acute osseous abnormality. Chronic T6 compression fracture with up to approximately 40-50% height loss and 4 mm bony retropulsion. Severe cervical spondylolysis extending from C3-4 through C7-T1. Associated 3 mm retrolisthesis of C5 on C6. Degenerative changes noted about the left TMJ. No worrisome osseous lesions. Other neck: No other acute soft tissue abnormality within the neck. Upper chest: Visualized upper chest demonstrates no acute finding. Partially visualized lungs are clear. Review of the MIP images confirms the above findings CTA HEAD FINDINGS Anterior circulation: Petrous segments widely patent bilaterally. Mild smooth atheromatous plaque within the cavernous/supraclinoid ICAs  with relatively mild diffuse narrowing. ICA termini widely patent. A1 segments patent bilaterally. Normal anterior communicating artery. Anterior cerebral arteries widely patent to their distal aspects without stenosis. M1 segments widely patent bilaterally. No proximal M2 occlusion. Distal MCA branches well perfused and symmetric. Posterior circulation: Vertebral arteries widely patent to the vertebrobasilar junction without stenosis. Left vertebral artery dominant. Patent left PICA. Right PICA not visualized. Dominant right AICA. Basilar artery widely patent to its distal aspect without stenosis. Superior cerebral arteries patent bilaterally. Left PCA supplied via the basilar. Hypoplastic right P1 with prominent right posterior communicating artery. PCAs well perfused to their distal aspects without stenosis. Venous sinuses: Grossly patent, although not well assessed due to arterial timing of the contrast bolus. Anatomic variants: None significant.  No intracranial aneurysm. Delayed phase: No abnormal enhancement. Review of the MIP images confirms the above findings IMPRESSION: 1. Negative CTA of the head and neck. No large vessel occlusion. No hemodynamically significant or correctable stenosis. 2. Minor carotid siphon atherosclerotic change for age. No other significant atheromatous disease involving the major arterial vasculature of the head and neck. 3. Tortuosity of the major arterial vasculature of the neck, suggesting chronic underlying hypertension. 4. Chronic T6 compression fracture as above. 5. Advanced cervical spondylolysis at C3-4 through C7-T1. Electronically Signed   By: Jeannine Boga M.D.   On: 08/05/2018 01:55   Dg Chest 1 View  Result  Date: 08/01/2018 CLINICAL DATA:  Altered mental status. EXAM: CHEST  1 VIEW COMPARISON:  Radiographs 07/13/2009 FINDINGS: Upper normal heart size. Moderate retrocardiac hiatal hernia. Pulmonary vasculature is normal. No consolidation, pleural effusion,  or pneumothorax. No acute osseous abnormalities are seen. IMPRESSION: 1. No acute pulmonary process. 2. Moderate hiatal hernia. Electronically Signed   By: Keith Rake M.D.   On: 08/01/2018 22:50   Ct Head Wo Contrast  Result Date: 08/01/2018 CLINICAL DATA:  Confusion EXAM: CT HEAD WITHOUT CONTRAST TECHNIQUE: Contiguous axial images were obtained from the base of the skull through the vertex without intravenous contrast. COMPARISON:  None. FINDINGS: Brain: No territorial infarction, hemorrhage or intracranial mass. Small focal hypodensity in the right basal ganglia. Atrophy with moderate small vessel ischemic changes of the white matter. Mildly prominent ventricles felt secondary to atrophy Vascular: No hyperdense vessels.  Carotid vascular calcification Skull: Normal. Negative for fracture or focal lesion. Sinuses/Orbits: No acute finding. Other: None IMPRESSION: 1. Small focal hypodensity in the right basal ganglia may reflect age indeterminate lacunar infarct. Negative for hemorrhage. 2. Atrophy and small vessel ischemic changes of the white matter. Electronically Signed   By: Donavan Foil M.D.   On: 08/01/2018 23:20   Ct Angio Neck W Or Wo Contrast  Result Date: 08/05/2018 CLINICAL DATA:  Follow-up examination for acute stroke. EXAM: CT ANGIOGRAPHY HEAD AND NECK TECHNIQUE: Multidetector CT imaging of the head and neck was performed using the standard protocol during bolus administration of intravenous contrast. Multiplanar CT image reconstructions and MIPs were obtained to evaluate the vascular anatomy. Carotid stenosis measurements (when applicable) are obtained utilizing NASCET criteria, using the distal internal carotid diameter as the denominator. CONTRAST:  35mL ISOVUE-370 IOPAMIDOL (ISOVUE-370) INJECTION 76% COMPARISON:  Prior MRI from 08/02/2018 and CT from 08/01/2018 FINDINGS: CT HEAD FINDINGS Brain: Atrophy with chronic microvascular ischemic disease again noted. Previously identified  small volume ischemic infarcts not visible by CT. No other acute large vessel territory infarct. No acute intracranial hemorrhage. No mass lesion, midline shift or mass effect. No hydrocephalus. No extra-axial fluid collection. Vascular: No hyperdense vessel. Scattered vascular calcifications noted within the carotid siphons. Skull: Scalp soft tissues and calvarium within normal limits. Sinuses: Paranasal sinuses are largely clear.  No mastoid effusion. Orbits: Globes and orbital soft tissues demonstrate no acute finding. Review of the MIP images confirms the above findings CTA NECK FINDINGS Aortic arch: Examination mildly degraded by motion artifact. Visualized aortic arch of normal caliber with normal 3 vessel morphology. Mild atheromatous plaque within the proximal descending intrathoracic aorta. No flow-limiting stenosis about the origin of the great vessels. Visualized subclavian arteries widely patent. Right carotid system: Right common carotid artery tortuous proximally but widely patent to the bifurcation. No significant atheromatous narrowing about the right bifurcation. Right ICA widely patent from the bifurcation to the skull base without stenosis, dissection, or occlusion. Left carotid system: Left common carotid artery tortuous proximally but widely patent to the bifurcation. No significant atheromatous change or stenosis about the left bifurcation. Left ICA patent from the bifurcation to the skull base without stenosis, dissection or occlusion. Vertebral arteries: Both of the vertebral arteries arise from the subclavian arteries. Vertebral arteries are somewhat tortuous proximally but widely patent to the skull base without stenosis, dissection, or occlusion. Left vertebral artery dominant. Skeleton: No acute osseous abnormality. Chronic T6 compression fracture with up to approximately 40-50% height loss and 4 mm bony retropulsion. Severe cervical spondylolysis extending from C3-4 through C7-T1.  Associated 3 mm retrolisthesis of C5  on C6. Degenerative changes noted about the left TMJ. No worrisome osseous lesions. Other neck: No other acute soft tissue abnormality within the neck. Upper chest: Visualized upper chest demonstrates no acute finding. Partially visualized lungs are clear. Review of the MIP images confirms the above findings CTA HEAD FINDINGS Anterior circulation: Petrous segments widely patent bilaterally. Mild smooth atheromatous plaque within the cavernous/supraclinoid ICAs with relatively mild diffuse narrowing. ICA termini widely patent. A1 segments patent bilaterally. Normal anterior communicating artery. Anterior cerebral arteries widely patent to their distal aspects without stenosis. M1 segments widely patent bilaterally. No proximal M2 occlusion. Distal MCA branches well perfused and symmetric. Posterior circulation: Vertebral arteries widely patent to the vertebrobasilar junction without stenosis. Left vertebral artery dominant. Patent left PICA. Right PICA not visualized. Dominant right AICA. Basilar artery widely patent to its distal aspect without stenosis. Superior cerebral arteries patent bilaterally. Left PCA supplied via the basilar. Hypoplastic right P1 with prominent right posterior communicating artery. PCAs well perfused to their distal aspects without stenosis. Venous sinuses: Grossly patent, although not well assessed due to arterial timing of the contrast bolus. Anatomic variants: None significant.  No intracranial aneurysm. Delayed phase: No abnormal enhancement. Review of the MIP images confirms the above findings IMPRESSION: 1. Negative CTA of the head and neck. No large vessel occlusion. No hemodynamically significant or correctable stenosis. 2. Minor carotid siphon atherosclerotic change for age. No other significant atheromatous disease involving the major arterial vasculature of the head and neck. 3. Tortuosity of the major arterial vasculature of the neck,  suggesting chronic underlying hypertension. 4. Chronic T6 compression fracture as above. 5. Advanced cervical spondylolysis at C3-4 through C7-T1. Electronically Signed   By: Jeannine Boga M.D.   On: 08/05/2018 01:55   Mr Brain Wo Contrast  Result Date: 08/02/2018 CLINICAL DATA:  Personal history of dementia with increased confusion over the last 3 days. Abnormal CT scan. EXAM: MRI HEAD WITHOUT CONTRAST TECHNIQUE: Multiplanar, multiecho pulse sequences of the brain and surrounding structures were obtained without intravenous contrast. COMPARISON:  CT head without contrast same day. FINDINGS: Brain: 15-18 punctate foci of restricted diffusion are scattered throughout both hemispheres. There is involvement of the right hippocampus, within the posterior circulation. No acceptable lesions are evident. At least 1 punctate focus of restricted diffusion is present posteriorly in each cerebellar hemisphere. Additional more remote lacunar infarcts are present in the inferior cerebellum bilaterally. Susceptibility weighted imaging demonstrates multiple foci susceptibility artifact in the cerebellum and posterior circulation in particular. There is more minimal susceptibility abnormality within the anterior territories. Acute hemorrhage is present. There is no mass lesion. The ventricles are of normal size. Periventricular white matter changes are evident bilaterally. Vascular: Flow is present in the major intracranial arteries. Skull and upper cervical spine: Skull base is within normal limits. The craniocervical junction is normal. Sinuses/Orbits: Polyp or mucous retention cyst is present inferiorly in the left maxillary sinus. Anterior ethmoid mucosal thickening is worse on the right. No fluid levels are present. There is some fluid in left mastoid air cells. No obstructing nasopharyngeal lesion is present. IMPRESSION: 1. Multiple foci of scattered punctate infarcts in bilateral cerebral and cerebellar  hemispheres. This suggests a central cardiac source. Question arrhythmia. EKG yesterday demonstrated normal sinus rhythm. Aortic valve disease or atherosclerotic changes in the ascending aorta should also be considered. 2. Multifocal areas of susceptibility, predominantly within the posterior circulation. This consistent with previous punctate hemorrhage and suggests an underlying vasculitis or significant atherosclerotic small vessel disease. 3. Advanced atrophy  and moderate white matter disease reflects the sequela of chronic microvascular ischemia. Electronically Signed   By: San Morelle M.D.   On: 08/02/2018 09:01   Vas US Carotid  Result Date: 08/05/2018 Carotid Arterial Duplex Study Indications: CVA. Limitations: patient tenderness to being touched Performing Technologist: Landry Mellow RDMS, RVT  Examination Guidelines: A complete evaluation includes B-mode imaging, spectral Doppler, color Doppler, and power Doppler as needed of all accessible portions of each vessel. Bilateral testing is considered an integral part of a complete examination. Limited examinations for reoccurring indications may be performed as noted.  Right Carotid Findings: +----------+--------+--------+--------+--------+------------------+           PSV cm/sEDV cm/sStenosisDescribeComments           +----------+--------+--------+--------+--------+------------------+ CCA Prox  144     22                                         +----------+--------+--------+--------+--------+------------------+ CCA Distal84      20                      intimal thickening +----------+--------+--------+--------+--------+------------------+ ICA Prox  114     28      1-39%           intimal thickening +----------+--------+--------+--------+--------+------------------+ ICA Distal137     28                                         +----------+--------+--------+--------+--------+------------------+ ECA       104     8                                           +----------+--------+--------+--------+--------+------------------+ +----------+--------+-------+----------------+-------------------+           PSV cm/sEDV cmsDescribe        Arm Pressure (mmHG) +----------+--------+-------+----------------+-------------------+ [id number]            Multiphasic, WNL                    +----------+--------+-------+----------------+-------------------+ +---------+--------+--+--------+--+---------+ VertebralPSV cm/s75EDV cm/s21Antegrade +---------+--------+--+--------+--+---------+  Left Carotid Findings: +----------+--------+--------+--------+--------+------------------+           PSV cm/sEDV cm/sStenosisDescribeComments           +----------+--------+--------+--------+--------+------------------+ CCA Prox  109     12                      tortuous           +----------+--------+--------+--------+--------+------------------+ CCA Distal87      16                      intimal thickening +----------+--------+--------+--------+--------+------------------+ ICA Prox  96      17      1-39%           intimal thickening +----------+--------+--------+--------+--------+------------------+ ICA Distal63      14                                         +----------+--------+--------+--------+--------+------------------+ ECA       110     12                                         +----------+--------+--------+--------+--------+------------------+ +----------+--------+--------+----------------+-------------------+  SubclavianPSV cm/sEDV cm/sDescribe        Arm Pressure (mmHG) +----------+--------+--------+----------------+-------------------+           135             Multiphasic, WNL                    +----------+--------+--------+----------------+-------------------+ +---------+--------+--+--------+--+---------+ VertebralPSV cm/s59EDV cm/s18Antegrade  +---------+--------+--+--------+--+---------+  Summary: Right Carotid: Velocities in the right ICA are consistent with a 1-39% stenosis. Left Carotid: Velocities in the left ICA are consistent with a 1-39% stenosis. Vertebrals:  Bilateral vertebral arteries demonstrate antegrade flow. Subclavians: Normal flow hemodynamics were seen in bilateral subclavian              arteries. *See table(s) above for measurements and observations.  Electronically signed by Antony Contras MD on 08/05/2018 at 8:18:56 AM.    Final        Subjective: Patient seen and examined the bedside this morning.  Remains comfortable.  Abdomen pain has improved.  Stable for discharge home today.  Discharge Exam: Vitals:   08/06/18 0353 08/06/18 0814  BP: (!) 154/80 (!) 143/71  Pulse: 76 67  Resp: 16 16  Temp: 98.3 F (36.8 C) 98.4 F (36.9 C)  SpO2: 97% 96%   Vitals:   08/05/18 2107 08/06/18 0007 08/06/18 0353 08/06/18 0814  BP: (!) 149/79 (!) 143/83 (!) 154/80 (!) 143/71  Pulse: 86 81 76 67  Resp: 16 16 16 16   Temp: 98.1 F (36.7 C) 99.2 F (37.3 C) 98.3 F (36.8 C) 98.4 F (36.9 C)  TempSrc: Oral Axillary Oral Oral  SpO2: 95% 94% 97% 96%  Weight:      Height:        General: Pt is alert, awake, not in acute distress Cardiovascular: RRR, S1/S2 +, no rubs, no gallops Respiratory: CTA bilaterally, no wheezing, no rhonchi Abdominal: Soft, NT, ND, bowel sounds + Extremities: no edema, no cyanosis    The results of significant diagnostics from this hospitalization (including imaging, microbiology, ancillary and laboratory) are listed below for reference.     Microbiology: Recent Results (from the past 240 hour(s))  Urine culture     Status: None   Collection Time: 08/01/18  9:50 PM  Result Value Ref Range Status   Specimen Description   Final    URINE, RANDOM Performed at [facility], [address], [place], [place] [address]    Special Requests   Final    NONE Performed at [facility],  [address] [place], [place] [address]    Culture   Final    NO GROWTH Performed at [facility], [place] [address] [place], [place] [address]    Report Status 05/04/202019 FINAL  Final  Culture, blood (Routine X 2) w Reflex to ID Panel     Status: None (Preliminary result)   Collection Time: 08/03/18  9:07 AM  Result Value Ref Range Status   Specimen Description BLOOD RIGHT ANTECUBITAL  Final   Special Requests   Final    BOTTLES DRAWN AEROBIC AND ANAEROBIC Blood Culture adequate volume   Culture   Final    NO GROWTH 2 DAYS Performed at [facility], [place] [address] [place], [place] [address]    Report Status PENDING  Incomplete  Culture, blood (Routine X 2) w Reflex to ID Panel     Status: None (Preliminary result)   Collection Time: 08/03/18  9:13 AM  Result Value Ref Range Status   Specimen  Description BLOOD LEFT HAND  Final   Special Requests   Final    BOTTLES DRAWN AEROBIC AND ANAEROBIC Blood Culture adequate volume   Culture   Final    NO GROWTH 2 DAYS Performed at [facility], [address] [place], [place] [address]    Report Status PENDING  Incomplete     Labs: BNP (last 3 results) No results for input(s): BNP in the last 8760 hours. Basic Metabolic Panel: Recent Labs  Lab 08/01/18 2127 08/02/18 0506 08/03/18 0503 08/04/18 0542  NA 133* 132* 133* 136  K 2.9* 3.0* 3.4* 3.5  CL 96* 101 103 102  CO2 28 25 22 25   GLUCOSE 118* 98 83 98  BUN 10 9 6* 7*  CREATININE 0.81 0.68 0.91 0.97  CALCIUM 9.2 8.7* 8.2* 8.6*  MG  --  1.7  --   --    Liver Function Tests: Recent Labs  Lab 08/01/18 2127 08/03/18 0503  AST 39 30  ALT 23 19  ALKPHOS 92 68  BILITOT 0.8 0.9  PROT 6.5 5.3*  ALBUMIN 3.2* 2.5*   Recent Labs  Lab 08/01/18 2127  LIPASE 38   No results for input(s): AMMONIA in the last 168 hours. CBC: Recent Labs  Lab 08/01/18 2127 08/02/18 0506 08/03/18 0503  WBC 5.9 5.4 4.4  NEUTROABS 4.3 3.8  --   HGB 11.3* 10.5* 10.3*  HCT  35.6* 33.3* 32.0*  MCV 95.2 96.2 94.7  PLT 142* 142* 128*   Cardiac Enzymes: Recent Labs  Lab 08/01/18 2127  TROPONINI <0.03   BNP: Invalid input(s): POCBNP CBG: No results for input(s): GLUCAP in the last 168 hours. D-Dimer No results for input(s): DDIMER in the last 72 hours. Hgb A1c No results for input(s): HGBA1C in the last 72 hours. Lipid Profile No results for input(s): CHOL, HDL, LDLCALC, TRIG, CHOLHDL, LDLDIRECT in the last 72 hours. Thyroid function studies No results for input(s): TSH, T4TOTAL, T3FREE, THYROIDAB in the last 72 hours.  Invalid input(s): FREET3 Anemia work up No results for input(s): VITAMINB12, FOLATE, FERRITIN, TIBC, IRON, RETICCTPCT in the last 72 hours. Urinalysis    Component Value Date/Time   COLORURINE YELLOW 08/01/2018 2150   APPEARANCEUR CLEAR 08/01/2018 2150   LABSPEC 1.013 08/01/2018 2150   PHURINE 6.0 08/01/2018 2150   GLUCOSEU NEGATIVE 08/01/2018 2150   HGBUR NEGATIVE 08/01/2018 2150   BILIRUBINUR NEGATIVE 08/01/2018 2150   KETONESUR NEGATIVE 08/01/2018 2150   PROTEINUR NEGATIVE 08/01/2018 2150   NITRITE NEGATIVE 08/01/2018 2150   LEUKOCYTESUR NEGATIVE 08/01/2018 2150   Sepsis Labs Invalid input(s): PROCALCITONIN,  WBC,  LACTICIDVEN Microbiology Recent Results (from the past 240 hour(s))  Urine culture     Status: None   Collection Time: 08/01/18  9:50 PM  Result Value Ref Range Status   Specimen Description   Final    URINE, RANDOM Performed at [facility], [address], [place], [place] [address]    Special Requests   Final    NONE Performed at [facility], [address], [place], [place] [address]    Culture   Final    NO GROWTH Performed at [facility], [place] [address], [place], [place] [address]    Report Status 08/14/202019 FINAL  Final  Culture, blood (Routine X 2) w Reflex to ID Panel     Status: None (Preliminary result)   Collection Time: 08/03/18  9:07 AM  Result Value Ref Range Status   Specimen  Description BLOOD RIGHT  ANTECUBITAL  Final   Special Requests   Final    BOTTLES DRAWN AEROBIC AND ANAEROBIC Blood Culture adequate volume   Culture   Final    NO GROWTH 2 DAYS Performed at [facility], [address] [place], [place] [address]    Report Status PENDING  Incomplete  Culture, blood (Routine X 2) w Reflex to ID Panel     Status: None (Preliminary result)   Collection Time: 08/03/18  9:13 AM  Result Value Ref Range Status   Specimen Description BLOOD LEFT HAND  Final   Special Requests   Final    BOTTLES DRAWN AEROBIC AND ANAEROBIC Blood Culture adequate volume   Culture   Final    NO GROWTH 2 DAYS Performed at [facility], [place] [address], [place], [place] [address]    Report Status PENDING  Incomplete    Please note: You were cared for by a hospitalist during your hospital stay. Once you are discharged, your primary care physician will handle any further medical issues. Please note that NO REFILLS for any discharge medications will be authorized once you are discharged, as it is imperative that you return to your primary care physician (or establish a relationship with a primary care physician if you do not have one) for your post hospital discharge needs so that they can reassess your need for medications and monitor your lab values.    Time coordinating discharge: 40 minutes  SIGNED:   Shelly Coss, MD  [facility] 08/06/2018, 11:06 AM Pager [phone]  If 7PM-7AM, please contact night-coverage [email or web address] Password [email or web address]

## 2018-08-06 NOTE — Progress Notes (Signed)
Pt discharged home with daughter. Discharge paperwork went over with pt and family. All questions and concerns addressed. All belongings sent with patient. Taken down in wheelchair.

## 2018-08-07 DIAGNOSIS — G8929 Other chronic pain: Secondary | ICD-10-CM | POA: Diagnosis not present

## 2018-08-07 DIAGNOSIS — F419 Anxiety disorder, unspecified: Secondary | ICD-10-CM | POA: Diagnosis not present

## 2018-08-07 DIAGNOSIS — M069 Rheumatoid arthritis, unspecified: Secondary | ICD-10-CM | POA: Diagnosis not present

## 2018-08-07 DIAGNOSIS — F329 Major depressive disorder, single episode, unspecified: Secondary | ICD-10-CM | POA: Diagnosis not present

## 2018-08-07 DIAGNOSIS — F0390 Unspecified dementia without behavioral disturbance: Secondary | ICD-10-CM | POA: Diagnosis not present

## 2018-08-07 DIAGNOSIS — Z853 Personal history of malignant neoplasm of breast: Secondary | ICD-10-CM | POA: Diagnosis not present

## 2018-08-07 DIAGNOSIS — N390 Urinary tract infection, site not specified: Secondary | ICD-10-CM | POA: Diagnosis not present

## 2018-08-07 DIAGNOSIS — G609 Hereditary and idiopathic neuropathy, unspecified: Secondary | ICD-10-CM | POA: Diagnosis not present

## 2018-08-07 DIAGNOSIS — K529 Noninfective gastroenteritis and colitis, unspecified: Secondary | ICD-10-CM | POA: Diagnosis not present

## 2018-08-07 DIAGNOSIS — M545 Low back pain: Secondary | ICD-10-CM | POA: Diagnosis not present

## 2018-08-07 DIAGNOSIS — E039 Hypothyroidism, unspecified: Secondary | ICD-10-CM | POA: Diagnosis not present

## 2018-08-07 DIAGNOSIS — M810 Age-related osteoporosis without current pathological fracture: Secondary | ICD-10-CM | POA: Diagnosis not present

## 2018-08-07 DIAGNOSIS — Z9181 History of falling: Secondary | ICD-10-CM | POA: Diagnosis not present

## 2018-08-08 ENCOUNTER — Other Ambulatory Visit: Payer: Self-pay

## 2018-08-08 LAB — CULTURE, BLOOD (ROUTINE X 2)
CULTURE: NO GROWTH
Culture: NO GROWTH
Special Requests: ADEQUATE
Special Requests: ADEQUATE

## 2018-08-08 NOTE — Patient Outreach (Signed)
[facility]) Care Management  08/08/2018  [name] 05/22/1941 [id number]     EMMI-STROKE RED ON EMMI ALERT Day # 1 Date: 08/07/18 Red Alert Reason: " Scheduled a follow up appt? I don't know  Filled new prescriptions? No"   Outreach attempt # 1 to patient. Spoke with patient who requested call be completed with her daughter. Reviewed and addressed red alerts with daughter. She voices that patient has cardiology appt scheduled already. She has not had a chance to make PCP and neuro appt. RN CM confirmed with daughter that she has contact info for MD offices and reviewed d/c summary instructions regarding appts. Daughter voices that she will call and make MD appts. She states that she normally takes patient to appts or her brother. RN CM confirmed that patient has all her meds. She denies any questions or concerns regarding them. Daughter voices no further RN CM needs or concerns at this time. Advised daughter that they would continue to get automated EMMI-Stroke post discharge calls to assess how they are doing following recent hospitalization and will receive a call from a nurse if any of their responses were abnormal. Daughter voiced understanding and was appreciative of f/u call.        Plan: RN CM will close case as no further interventions needed at this time.    Enzo Montgomery, RN,BSN,CCM [place] Management Telephonic Care Management Coordinator Direct Phone: [phone] Toll Free: [phone] Fax: [phone]

## 2018-08-10 DIAGNOSIS — E782 Mixed hyperlipidemia: Secondary | ICD-10-CM | POA: Diagnosis not present

## 2018-08-10 DIAGNOSIS — I10 Essential (primary) hypertension: Secondary | ICD-10-CM | POA: Diagnosis not present

## 2018-08-12 ENCOUNTER — Other Ambulatory Visit: Payer: Self-pay

## 2018-08-12 DIAGNOSIS — S22050K Wedge compression fracture of T5-T6 vertebra, subsequent encounter for fracture with nonunion: Secondary | ICD-10-CM | POA: Diagnosis not present

## 2018-08-12 DIAGNOSIS — K449 Diaphragmatic hernia without obstruction or gangrene: Secondary | ICD-10-CM | POA: Diagnosis not present

## 2018-08-12 DIAGNOSIS — M47812 Spondylosis without myelopathy or radiculopathy, cervical region: Secondary | ICD-10-CM | POA: Diagnosis not present

## 2018-08-12 DIAGNOSIS — I700 Atherosclerosis of aorta: Secondary | ICD-10-CM | POA: Diagnosis not present

## 2018-08-12 DIAGNOSIS — M479 Spondylosis, unspecified: Secondary | ICD-10-CM | POA: Diagnosis not present

## 2018-08-12 DIAGNOSIS — N281 Cyst of kidney, acquired: Secondary | ICD-10-CM | POA: Diagnosis not present

## 2018-08-12 DIAGNOSIS — K5731 Diverticulosis of large intestine without perforation or abscess with bleeding: Secondary | ICD-10-CM | POA: Diagnosis not present

## 2018-08-12 DIAGNOSIS — M4696 Unspecified inflammatory spondylopathy, lumbar region: Secondary | ICD-10-CM | POA: Diagnosis not present

## 2018-08-12 DIAGNOSIS — I10 Essential (primary) hypertension: Secondary | ICD-10-CM | POA: Diagnosis not present

## 2018-08-12 DIAGNOSIS — M4186 Other forms of scoliosis, lumbar region: Secondary | ICD-10-CM | POA: Diagnosis not present

## 2018-08-12 DIAGNOSIS — F0390 Unspecified dementia without behavioral disturbance: Secondary | ICD-10-CM | POA: Diagnosis not present

## 2018-08-12 DIAGNOSIS — I6523 Occlusion and stenosis of bilateral carotid arteries: Secondary | ICD-10-CM | POA: Diagnosis not present

## 2018-08-12 NOTE — Patient Outreach (Signed)
[facility] [facility]) Care Management  08/12/2018  [name] 13-May-1941 [id number]     EMMI-STROKE RED ON EMMI ALERT Day # 3 Date: 08/09/18 Red Alert Reason: "Feeling worse overall? Yes"   Outreach attempt # 1 to patient. Spoke with patient. She voices that she is feeling fine. Reviewed and addressed red alert with patient. She does not recall responding to question that way but also voices that sometimes her caregivers answer the automated call. She denies feeling worse over the weekend. She states that she feels like she has been doing "just fine." She denies any new and/or worsening issues at this time. Patient voices no further RN CM needs or concerns at this time.        Plan: RN CM will close case at this time as no further interventions needed.    Enzo Montgomery, RN,BSN,CCM [place] Management Telephonic Care Management Coordinator Direct Phone: [phone] Toll Free: [phone] Fax: [phone]

## 2018-08-14 ENCOUNTER — Telehealth: Payer: Self-pay | Admitting: *Deleted

## 2018-08-14 DIAGNOSIS — I639 Cerebral infarction, unspecified: Secondary | ICD-10-CM

## 2018-08-14 NOTE — Telephone Encounter (Signed)
Spoke with patient's daughter, [name], to cancel wound check for tomorrow as ILR implant was canceled. She is aware that appointment has been canceled.   [name] requests recommendations regarding outpatient cardiology f/u. Per hospital d/c summary, patient is to schedule f/u for 30 day event monitor. Advised I will route this message to Dr. Curt Bears and Venida Jarvis, RN, for further advisement. [name] is agreeable to plan.

## 2018-08-15 ENCOUNTER — Ambulatory Visit: Payer: PPO

## 2018-08-16 ENCOUNTER — Other Ambulatory Visit: Payer: Self-pay

## 2018-08-16 NOTE — Telephone Encounter (Signed)
Follow up     Called pt to schedule event monitor.  Daughter, [name], stated that she thought pt was getting a loop recorder as an outpatient.  She stated that pt has dementia and this monitor would probably not go well with pt.  Please let me know if pt still need event monitor.

## 2018-08-16 NOTE — Telephone Encounter (Signed)
Ok to speak to dtr. Informed [name], dtr, that 30 day monitor recommended and we discussed whether or not mom would actually wear it.  We are going to try using the [employer] patch monitor.  dtr will also attend appt. She is aware office will call next week to arrange monitor.

## 2018-08-16 NOTE — Patient Outreach (Signed)
[place] [facility]) Care Management  08/16/2018  [name] 04/07/1941 [id number]      RED ON EMMI ALERT Day # 9 Date: 08/15/18 Red Alert Reason: " Sad,hopeless,anxious or empty? Yes"   Outreach attempt # 1 to patient. Spoke with patient who requested call be completed with her daughter-[name]. Reviewed and addressed red alert. She reports that patient has suffered from depression a while-since her first TIA. She voices that patient is currently taking Cymbalta for depression. Daughter feels that initially when patient came home depression worsened. However, she voices that "for the last few days it has been better." Daughter states that she is a [employer] scientist and is aware of s/s of worsening condition and when to seek medial attention. She states that patient has lost her appetite since stroke. RN CM provided possible suggestions for family to try to assist with increased nutritional intake. She voiced understanding. No further RN CM needs or concerns at this time. Advised daughter that patient would get a few more automated post discharge EMMI-Stroke calls and will receive a call from a nurse for any abnormal responses. She voiced understanding and was appreciative of call.     Plan: RN CM will close case at this time.   Enzo Montgomery, RN,BSN,CCM [place] Management Telephonic Care Management Coordinator Direct Phone: [phone] Toll Free: [phone] Fax: [phone]

## 2018-08-22 DIAGNOSIS — M069 Rheumatoid arthritis, unspecified: Secondary | ICD-10-CM | POA: Diagnosis not present

## 2018-08-22 DIAGNOSIS — Z853 Personal history of malignant neoplasm of breast: Secondary | ICD-10-CM | POA: Diagnosis not present

## 2018-08-22 DIAGNOSIS — E039 Hypothyroidism, unspecified: Secondary | ICD-10-CM | POA: Diagnosis not present

## 2018-08-22 DIAGNOSIS — F419 Anxiety disorder, unspecified: Secondary | ICD-10-CM | POA: Diagnosis not present

## 2018-08-22 DIAGNOSIS — M545 Low back pain: Secondary | ICD-10-CM | POA: Diagnosis not present

## 2018-08-22 DIAGNOSIS — G609 Hereditary and idiopathic neuropathy, unspecified: Secondary | ICD-10-CM | POA: Diagnosis not present

## 2018-08-22 DIAGNOSIS — Z9181 History of falling: Secondary | ICD-10-CM | POA: Diagnosis not present

## 2018-08-22 DIAGNOSIS — G8929 Other chronic pain: Secondary | ICD-10-CM | POA: Diagnosis not present

## 2018-08-22 DIAGNOSIS — M810 Age-related osteoporosis without current pathological fracture: Secondary | ICD-10-CM | POA: Diagnosis not present

## 2018-08-22 DIAGNOSIS — N390 Urinary tract infection, site not specified: Secondary | ICD-10-CM | POA: Diagnosis not present

## 2018-08-22 DIAGNOSIS — F0390 Unspecified dementia without behavioral disturbance: Secondary | ICD-10-CM | POA: Diagnosis not present

## 2018-08-22 DIAGNOSIS — K529 Noninfective gastroenteritis and colitis, unspecified: Secondary | ICD-10-CM | POA: Diagnosis not present

## 2018-08-22 DIAGNOSIS — F329 Major depressive disorder, single episode, unspecified: Secondary | ICD-10-CM | POA: Diagnosis not present

## 2018-08-24 DIAGNOSIS — E782 Mixed hyperlipidemia: Secondary | ICD-10-CM | POA: Diagnosis not present

## 2018-08-24 DIAGNOSIS — E039 Hypothyroidism, unspecified: Secondary | ICD-10-CM | POA: Diagnosis not present

## 2018-08-24 DIAGNOSIS — N390 Urinary tract infection, site not specified: Secondary | ICD-10-CM | POA: Diagnosis not present

## 2018-08-24 DIAGNOSIS — F411 Generalized anxiety disorder: Secondary | ICD-10-CM | POA: Diagnosis not present

## 2018-08-24 DIAGNOSIS — F482 Pseudobulbar affect: Secondary | ICD-10-CM | POA: Diagnosis not present

## 2018-08-24 DIAGNOSIS — G8929 Other chronic pain: Secondary | ICD-10-CM | POA: Diagnosis not present

## 2018-08-24 DIAGNOSIS — K5190 Ulcerative colitis, unspecified, without complications: Secondary | ICD-10-CM | POA: Diagnosis not present

## 2018-08-24 DIAGNOSIS — H547 Unspecified visual loss: Secondary | ICD-10-CM | POA: Diagnosis not present

## 2018-08-24 DIAGNOSIS — G3184 Mild cognitive impairment, so stated: Secondary | ICD-10-CM | POA: Diagnosis not present

## 2018-08-24 DIAGNOSIS — F0390 Unspecified dementia without behavioral disturbance: Secondary | ICD-10-CM | POA: Diagnosis not present

## 2018-08-24 DIAGNOSIS — F39 Unspecified mood [affective] disorder: Secondary | ICD-10-CM | POA: Diagnosis not present

## 2018-08-24 DIAGNOSIS — H9190 Unspecified hearing loss, unspecified ear: Secondary | ICD-10-CM | POA: Diagnosis not present

## 2018-08-24 DIAGNOSIS — D649 Anemia, unspecified: Secondary | ICD-10-CM | POA: Diagnosis not present

## 2018-08-24 DIAGNOSIS — I6523 Occlusion and stenosis of bilateral carotid arteries: Secondary | ICD-10-CM | POA: Diagnosis not present

## 2018-08-29 ENCOUNTER — Emergency Department (HOSPITAL_COMMUNITY): Payer: PPO

## 2018-08-29 ENCOUNTER — Encounter (HOSPITAL_COMMUNITY): Payer: Self-pay | Admitting: Emergency Medicine

## 2018-08-29 ENCOUNTER — Emergency Department (HOSPITAL_COMMUNITY)
Admission: EM | Admit: 2018-08-29 | Discharge: 2018-08-29 | Disposition: A | Discharge status: Home / Self Care | Payer: PPO | Attending: Emergency Medicine | Admitting: Emergency Medicine

## 2018-08-29 DIAGNOSIS — K449 Diaphragmatic hernia without obstruction or gangrene: Secondary | ICD-10-CM | POA: Diagnosis not present

## 2018-08-29 DIAGNOSIS — R5381 Other malaise: Secondary | ICD-10-CM | POA: Diagnosis not present

## 2018-08-29 DIAGNOSIS — Z7902 Long term (current) use of antithrombotics/antiplatelets: Secondary | ICD-10-CM | POA: Diagnosis not present

## 2018-08-29 DIAGNOSIS — R404 Transient alteration of awareness: Secondary | ICD-10-CM | POA: Diagnosis not present

## 2018-08-29 DIAGNOSIS — I10 Essential (primary) hypertension: Secondary | ICD-10-CM | POA: Insufficient documentation

## 2018-08-29 DIAGNOSIS — R4182 Altered mental status, unspecified: Secondary | ICD-10-CM | POA: Diagnosis not present

## 2018-08-29 DIAGNOSIS — E039 Hypothyroidism, unspecified: Secondary | ICD-10-CM | POA: Diagnosis not present

## 2018-08-29 DIAGNOSIS — R0902 Hypoxemia: Secondary | ICD-10-CM | POA: Diagnosis not present

## 2018-08-29 DIAGNOSIS — Z853 Personal history of malignant neoplasm of breast: Secondary | ICD-10-CM | POA: Insufficient documentation

## 2018-08-29 DIAGNOSIS — Z79899 Other long term (current) drug therapy: Secondary | ICD-10-CM | POA: Insufficient documentation

## 2018-08-29 DIAGNOSIS — R1084 Generalized abdominal pain: Secondary | ICD-10-CM

## 2018-08-29 DIAGNOSIS — R109 Unspecified abdominal pain: Secondary | ICD-10-CM | POA: Insufficient documentation

## 2018-08-29 LAB — CBC WITH DIFFERENTIAL/PLATELET
Abs Immature Granulocytes: 0.03 10*3/uL (ref 0.00–0.07)
Basophils Absolute: 0 10*3/uL (ref 0.0–0.1)
Basophils Relative: 1 %
EOS PCT: 2 %
Eosinophils Absolute: 0.1 10*3/uL (ref 0.0–0.5)
HCT: 40.3 % (ref 36.0–46.0)
Hemoglobin: 12.7 g/dL (ref 12.0–15.0)
Immature Granulocytes: 1 %
LYMPHS PCT: 16 %
Lymphs Abs: 0.7 10*3/uL (ref 0.7–4.0)
MCH: 30.6 pg (ref 26.0–34.0)
MCHC: 31.5 g/dL (ref 30.0–36.0)
MCV: 97.1 fL (ref 80.0–100.0)
Monocytes Absolute: 0.5 10*3/uL (ref 0.1–1.0)
Monocytes Relative: 12 %
Neutro Abs: 3.2 10*3/uL (ref 1.7–7.7)
Neutrophils Relative %: 68 %
Platelets: 115 10*3/uL — ABNORMAL LOW (ref 150–400)
RBC: 4.15 MIL/uL (ref 3.87–5.11)
RDW: 16.8 % — ABNORMAL HIGH (ref 11.5–15.5)
WBC: 4.6 10*3/uL (ref 4.0–10.5)
nRBC: 0 % (ref 0.0–0.2)

## 2018-08-29 LAB — COMPREHENSIVE METABOLIC PANEL
ALT: 16 U/L (ref 0–44)
AST: 27 U/L (ref 15–41)
Albumin: 3.3 g/dL — ABNORMAL LOW (ref 3.5–5.0)
Alkaline Phosphatase: 74 U/L (ref 38–126)
Anion gap: 6 (ref 5–15)
BUN: 14 mg/dL (ref 8–23)
CO2: 28 mmol/L (ref 22–32)
Calcium: 9.5 mg/dL (ref 8.9–10.3)
Chloride: 106 mmol/L (ref 98–111)
Creatinine, Ser: 0.91 mg/dL (ref 0.44–1.00)
GFR calc Af Amer: >60 mL/min (ref >60)
GFR calc non Af Amer: >60 mL/min (ref >60)
Glucose, Bld: 98 mg/dL (ref 70–99)
Potassium: 3.9 mmol/L (ref 3.5–5.1)
Sodium: 140 mmol/L (ref 135–145)
Total Bilirubin: 0.6 mg/dL (ref 0.3–1.2)
Total Protein: 6.8 g/dL (ref 6.5–8.1)

## 2018-08-29 LAB — URINALYSIS, ROUTINE W REFLEX MICROSCOPIC
Bilirubin Urine: NEGATIVE
Glucose, UA: NEGATIVE mg/dL
HGB URINE DIPSTICK: NEGATIVE
Ketones, ur: 5 mg/dL — AB
Leukocytes, UA: NEGATIVE
Nitrite: NEGATIVE
Protein, ur: NEGATIVE mg/dL
Specific Gravity, Urine: 1.017 (ref 1.005–1.030)
pH: 6 (ref 5.0–8.0)

## 2018-08-29 LAB — LIPASE, BLOOD: Lipase: 43 U/L (ref 11–51)

## 2018-08-29 MED ORDER — SODIUM CHLORIDE 0.9 % IV BOLUS
1000.0000 mL | Freq: Once | INTRAVENOUS | Status: AC
  Administered 2018-08-29: 1000 mL via INTRAVENOUS

## 2018-08-29 MED ORDER — ACETAMINOPHEN 500 MG PO TABS
1000.0000 mg | ORAL_TABLET | Freq: Once | ORAL | Status: AC
  Administered 2018-08-29: 1000 mg via ORAL
  Filled 2018-08-29: qty 2

## 2018-08-29 MED ORDER — IOPAMIDOL (ISOVUE-300) INJECTION 61%
100.0000 mL | Freq: Once | INTRAVENOUS | Status: AC | PRN
  Administered 2018-08-29: 100 mL via INTRAVENOUS

## 2018-08-29 NOTE — Discharge Instructions (Addendum)
The testing today is reassuring.  Her symptoms indicate that she is improving now.  We have sent referrals to both neurology and cardiology to hopefully get her earlier appointments for assessment by their services.  Continue to encourage her to eat and drink.  Give her Tylenol as needed for pain.  Return here, if needed, for problems.

## 2018-08-29 NOTE — ED Triage Notes (Signed)
Pt brought in from home.  Is normally confused and has cognitive deficits (prior stroke per daughter).  Is usually able to get up and go into kitchen and follow basic commands, but this morning was not as active as usual and was not getting up.  Pt arrives and will open eyes to vigorous stimulus, but otherwise is obtunded.

## 2018-08-29 NOTE — ED Notes (Signed)
Family at bedside. 

## 2018-08-29 NOTE — ED Notes (Signed)
ED Provider at bedside. 

## 2018-08-29 NOTE — ED Provider Notes (Signed)
[facility] EMERGENCY DEPARTMENT Provider Note   CSN: [id number] Arrival date & time: 08/29/18  1133     History   Chief Complaint Chief Complaint  Patient presents with  . Altered Mental Status    HPI [name] is a 77 y.o. female.  HPI   She is here for evaluation of altered mental status with decreased ability to ambulate, that started this morning after she awoke.  During the night she was able to walk.  Her daughter is worried that she might have worsening of her prior "mini strokes, an infection of the urine, or colitis."  She was recently treated for all of these issues.  She saw a doctor at an urgent care about a week ago and was placed on Cipro and Flagyl, for 5 days.  To that she had been lysed, for altered mental status and was diagnosed with troches based on MRI imaging.  She had a comprehensive evaluation looking for sources of embolic disease.  She was not found to have a cardiac arrhythmia.  She lives with her daughter, who is here with her.  The patient is unable to contribute to history.  Level 5 caveat-altered mental status  Past Medical History:  Diagnosis Date  . Anxiety   . Arm fracture, left   . Breast cancer ([place])   . Bulging disc   . Cardioembolic stroke ([place]) 67/59/1638  . Confusion   . Depression   . Difficulty swallowing solids   . GERD (gastroesophageal reflux disease)   . Hypertension   . Hypothyroidism   . Invasive ductal carcinoma of left breast ([place]) 02/24/2014  . Neuropathy   . Osteoporosis   . 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 Rotator cuff disorder, right   . Scoliosis   . Shortness of breath dyspnea    "I have reactive airway disease".    Patient Active Problem List   Diagnosis Date Noted  . Cardioembolic stroke ([place]) 46/65/9935  . Acute cardioembolic stroke ([place]) 70/17/7939  . Acute colitis 08/01/2018  . Hypothyroidism 08/01/2018  . Hyponatremia 08/01/2018  . Hypokalemia 08/01/2018  . Infarction of right basal  ganglia ([place]) 08/01/2018  . Hypertension 08/01/2018  . Dementia ([place]) 08/01/2018  . Melena 05/07/2018  . Esophageal dysphagia 05/07/2018  . Osteoporosis with pathological fracture of forearm 10/03/2016  . Lumbar radiculopathy 03/13/2016  . Invasive ductal carcinoma of left breast ([place]) 02/24/2014  . 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 Multiple thyroid nodules 06/03/2013  . Rheumatoid arthritis ([place]) 07/15/2008    Past Surgical History:  Procedure Laterality Date  . ABDOMINAL HYSTERECTOMY    . Barbie Banner OSTEOTOMY Right 10/28/2014   Procedure: Barbie Banner OSTEOTOMY RIGHT FOOT;  Surgeon: Marcheta Grammes, DPM;  Location: [facility] ORS;  Service: Podiatry;  Laterality: Right;  . BIOPSY  05/15/2018   Procedure: BIOPSY;  Surgeon: Rogene Houston, MD;  Location: [facility] ENDO SUITE;  Service: Endoscopy;;  gastric  . BONE BIOPSY  09/29/2015   Procedure: BONE BIOPSY AND BONE CULTURE SECOND TOE RIGHT FOOT;  Surgeon: Caprice Beaver, DPM;  Location: [facility] ORS;  Service: Podiatry;;  . BREAST SURGERY Left    lumpectomy and axillary lymph node with re-excision 2 weeks later   . BUNIONECTOMY Right 10/28/2014   Procedure: SILVER BUNIONECTOMY RIGHT FOOT;  Surgeon: Marcheta Grammes, DPM;  Location: [facility] ORS;  Service: Podiatry;  Laterality: Right;  . CATARACT EXTRACTION W/PHACO Left 08/10/2017   Procedure: CATARACT EXTRACTION PHACO AND  INTRAOCULAR LENS PLACEMENT LEFT EYE;  Surgeon: Baruch Goldmann, MD;  Location: [facility] ORS;  Service: Ophthalmology;  Laterality: Left;  CDE: 13.81  . CATARACT EXTRACTION W/PHACO Right 09/07/2017   Procedure: CATARACT EXTRACTION PHACO AND INTRAOCULAR LENS PLACEMENT RIGHT EYE;  Surgeon: Baruch Goldmann, MD;  Location: [facility] ORS;  Service: Ophthalmology;  Laterality: Right;  right  . COLONOSCOPY    . DILATION AND CURETTAGE OF UTERUS    . ESOPHAGEAL DILATION N/A 05/15/2018   Procedure: ESOPHAGEAL DILATION;  Surgeon: Rogene Houston, MD;  Location: [facility] END[facility] SU[facility];   Service: Endoscopy;  Laterality: N/A;  . esophageal stricture    . ESOPHAGOGASTRODUODENOSCOPY N/A 05/15/2018   Procedure: ESOPHAGOGASTRODUODENOSCOPY (EGD);  Surgeon: Rogene Houston, MD;  Location: [facility];  Service: Endoscopy;  Laterality: N/A;  1:45  . HAMMER TOE SURGERY Right 10/28/2014   Procedure: HAMMER TOE CORRECTION 2ND AND 3RD TOE RIGHT FOOT;  Surgeon: Marcheta Grammes, DPM;  Location: [facility];  Service: Podiatry;  Laterality: Right;  . LUMBAR LAMINECTOMY/DECOMPRESSION MICRODISCECTOMY Left 03/13/2016   Procedure: Left Lumbar Four-Five, Lumbar Five-Sacral One Laminotomy/Foraminotomy;  Surgeon: Kevan Ny Ditty, MD;  Location: [facility] NEURO ORS;  Service: Neurosurgery;  Laterality: Left;  . REMOVAL OF IMPLANT Right 09/29/2015   Procedure: REMOVAL OF IMPLANT 2ND TOE RIGHT FOOT;  Surgeon: Caprice Beaver, DPM;  Location: [facility] ORS;  Service: Podiatry;  Laterality: Right;  . TEE WITHOUT CARDIOVERSION N/A 08/05/2018   Procedure: TRANSESOPHAGEAL ECHOCARDIOGRAM (TEE);  Surgeon: Skeet Latch, MD;  Location: [facility] ENDOSCOPY;  Service: Cardiovascular;  Laterality: N/A;     OB History    Gravida  2   Para  2   Term  2   Preterm      AB      Living        SAB      TAB      Ectopic      Multiple      Live Births               Home Medications    Prior to Admission medications   Medication Sig Start Date End Date Taking? Authorizing Provider  albuterol (PROAIR HFA) 108 (90 Base) MCG/ACT inhaler Inhale 2 puffs into the lungs every 6 (six) hours as needed for wheezing or shortness of breath.    Yes [provider]  cetirizine (ZYRTEC) 10 MG tablet Take 10 mg by mouth daily as needed for allergies.    Yes [provider]  Cholecalciferol (VITAMIN D) 2000 UNITS CAPS Take 2,000 Units by mouth daily.    Yes [provider]  ciprofloxacin (CIPRO) 500 MG tablet Take 1 tablet (500 mg total) by mouth 2 (two) times daily. 08/06/18  Yes Shelly Coss, MD  clopidogrel (PLAVIX) 75 MG tablet Take 1 tablet (75 mg total) by mouth daily. 08/07/18  Yes Shelly Coss, MD  Cranberry 125 MG TABS Take 1 tablet by mouth 3 (three) times daily as needed.   Yes [provider]  denosumab (PROLIA) 60 MG/ML SOLN injection Inject 60 mg into the skin every 6 (six) months. Administer in upper arm, thigh, or abdomen, got in June 2019   Yes [provider]  donepezil (ARICEPT) 5 MG tablet Take 5 mg by mouth at bedtime. 04/01/18  Yes [provider]  DULoxetine (CYMBALTA) 60 MG capsule Take 60 mg by mouth daily.   Yes [provider]  lisinopril (PRINIVIL,ZESTRIL) 10 MG tablet Take 5 mg by mouth daily. Son says  she takes it as needed when she feels her BP is high, she will check it and then take it if needed.   Yes [provider]  Magnesium 500 MG CAPS Take 500 mg by mouth daily.    Yes [provider]  memantine (NAMENDA) 10 MG tablet Take 10 mg by mouth 2 (two) times daily.   Yes [provider]  metroNIDAZOLE (FLAGYL) 500 MG tablet Take 1 tablet (500 mg total) by mouth every 8 (eight) hours. Patient taking differently: Take 500 mg by mouth 2 (two) times daily.  08/06/18  Yes Shelly Coss, MD  omeprazole (PRILOSEC) 20 MG capsule Take 20 mg by mouth 2 (two) times daily before a meal.   Yes [provider]  OVER THE COUNTER MEDICATION Take 8 oz by mouth daily as needed (leg pain). Tonic Water with Quinine   Yes [provider]  polyethylene glycol (MIRALAX / GLYCOLAX) packet Take 17 g by mouth daily. 08/07/18  Yes Shelly Coss, MD  potassium chloride (K-DUR) 10 MEQ tablet Take 10 mEq by mouth at bedtime.   Yes [provider]  senna (EQ NATURAL VEGETABLE LAXATIVE) 8.6 MG tablet Take 1 tablet by mouth daily as needed for constipation.   Yes [provider]  Calcium Carbonate-Vitamin D (CALTRATE 600+D) 600-400 MG-UNIT tablet Take 1 tablet by mouth 2 (two) times  daily.    [provider]  folic acid (FOLVITE) 892 MCG tablet Take 800 mcg by mouth daily.    [provider]  HYDROcodone-acetaminophen (NORCO/VICODIN) 5-325 MG tablet Take 1 tablet by mouth daily as needed for pain. 02/04/18   [provider]  methotrexate (RHEUMATREX) 2.5 MG tablet Take 22.5 mg by mouth once a week. Every Monday 08/31/15   [provider]  pantoprazole (PROTONIX) 40 MG tablet Take 1 tablet (40 mg total) by mouth daily. Patient not taking: Reported on 08/29/2018 05/02/18   Butch Penny, NP  Zinc 50 MG CAPS Take 50 mg by mouth daily.     [provider]    Family History Family History  Problem Relation Age of Onset  . Hypertension Mother   . Cancer Maternal Grandmother   . Cancer Cousin     Social History Social History   Tobacco Use  . Smoking status: Never Smoker  . Smokeless tobacco: Never Used  Substance Use Topics  . Alcohol use: No  . Drug use: No     Allergies   Celecoxib; Ibuprofen; Diphenhydramine; and Wygesic [propoxyphene n-acetaminophen]   Review of Systems Review of Systems  All other systems reviewed and are negative.    Physical Exam Updated Vital Signs BP (!) 147/79   Pulse 73   Temp 97.9 F (36.6 C) (Rectal)   Resp 18   Ht 5' (1.524 m)   Wt 54.4 kg   SpO2 96%   BMI 23.44 kg/m   Physical Exam Vitals signs and nursing note reviewed.  Constitutional:      Appearance: She is well-developed. She is ill-appearing (Frail, under nourished). She is not diaphoretic.     Comments: Elderly, frail  HENT:     Head: Normocephalic and atraumatic.     Right Ear: External ear normal.     Left Ear: External ear normal.  Eyes:     Conjunctiva/sclera: Conjunctivae normal.     Pupils: Pupils are equal, round, and reactive to light.  Neck:     Musculoskeletal: Normal range of motion and neck supple. No neck rigidity or  muscular tenderness.     Trachea: Phonation normal.  Cardiovascular:      Rate and Rhythm: Normal rate and regular rhythm.     Heart sounds: Normal heart sounds.  Pulmonary:     Effort: Pulmonary effort is normal.     Breath sounds: Normal breath sounds.  Abdominal:     General: Bowel sounds are normal. There is no distension.     Palpations: Abdomen is soft. There is no mass.     Tenderness: There is no abdominal tenderness.     Hernia: No hernia is present.  Musculoskeletal: Normal range of motion.  Skin:    General: Skin is warm and dry.  Neurological:     Mental Status: She is alert.     Cranial Nerves: No cranial nerve deficit.     Sensory: No sensory deficit.     Motor: No abnormal muscle tone.     Coordination: Coordination normal.     Comments: Patient is alert and responsive.  She is able to speak her name.  He follows commands accurately.  Normal strength with grip bilateral hands.  Psychiatric:        Mood and Affect: Mood normal.        Behavior: Behavior normal.      ED Treatments / Results  Labs (all labs ordered are listed, but only abnormal results are displayed) Labs Reviewed  URINALYSIS, ROUTINE W REFLEX MICROSCOPIC - Abnormal; Notable for the following components:      Result Value   Ketones, ur 5 (*)    All other components within normal limits  COMPREHENSIVE METABOLIC PANEL - Abnormal; Notable for the following components:   Albumin 3.3 (*)    All other components within normal limits  CBC WITH DIFFERENTIAL/PLATELET - Abnormal; Notable for the following components:   RDW 16.8 (*)    Platelets 115 (*)    All other components within normal limits  URINE CULTURE  LIPASE, BLOOD    EKG EKG Interpretation  Date/Time:  Thursday August 29 2018 11:41:13 EST Ventricular Rate:  79 PR Interval:    QRS Duration: 105 QT Interval:  409 QTC Calculation: 469 R Axis:   53 Text Interpretation:  Sinus rhythm Baseline wander in lead(s) V3 since last tracing no significant change Confirmed by Daleen Bo [phone]) on 08/29/2018  12:11:44 PM   Radiology Ct Abdomen Pelvis W Contrast  Result Date: 08/29/2018 CLINICAL DATA:  Abdominal pain.  History of recent colitis. EXAM: CT ABDOMEN AND PELVIS WITH CONTRAST TECHNIQUE: Multidetector CT imaging of the abdomen and pelvis was performed using the standard protocol following bolus administration of intravenous contrast. CONTRAST:  170mL ISOVUE-300 IOPAMIDOL (ISOVUE-300) INJECTION 61% COMPARISON:  08/01/2018 FINDINGS: Lower chest: Bibasilar atelectasis noted. No definite infiltrates or effusions. The heart is borderline enlarged but stable. Stable aortic and coronary artery calcifications. Stable moderate to large hiatal hernia. Hepatobiliary: No focal hepatic lesions or intrahepatic biliary dilatation. The gallbladder is normal. No common bile duct dilatation. Pancreas: No mass, inflammation or ductal dilatation. Spleen: Normal size.  No focal lesions. Adrenals/Urinary Tract: The adrenal glands and kidneys are normal. Stable bilateral renal cysts. No worrisome renal lesions, renal or ureteral calculi. No bladder calculi or mass. The delayed images do not demonstrate any significant collecting system abnormalities. Stomach/Bowel: The stomach, duodenum, small bowel and colon are unremarkable. No acute inflammatory changes, mass lesions or obstructive findings. The terminal ileum and appendix are normal. Vascular/Lymphatic: Stable scattered atherosclerotic calcifications involving the abdominal aorta and iliac arteries but  no aneurysm or dissection. The branch vessels are patent. The major venous structures are patent. Small scattered mesenteric and retroperitoneal lymph nodes but no mass or adenopathy. Reproductive: Surgically absent. Other: No pelvic mass or adenopathy. No free pelvic fluid collections. No inguinal mass or adenopathy. No abdominal wall hernia or subcutaneous lesions. Musculoskeletal: Scoliosis and advanced degenerative lumbar spondylosis but no acute bony findings or  worrisome bone lesions. Both hips are normally located. IMPRESSION: No acute abdominal/pelvic findings, mass lesions or adenopathy. Stable moderate to large hiatal hernia. Electronically Signed   By: Marijo Sanes M.D.   On: 08/29/2018 16:02   Dg Chest Port 1 View  Result Date: 08/29/2018 CLINICAL DATA:  77 year old female with altered mental status. EXAM: PORTABLE CHEST 1 VIEW COMPARISON:  08/01/2018 and earlier. FINDINGS: Portable AP upright view at 1237 hours. Moderate size chronic hiatal hernia. Other mediastinal contours are within normal limits. Visualized tracheal air column is within normal limits. Stable lung volumes. Allowing for portable technique the lungs are clear. No acute osseous abnormality identified. IMPRESSION: No acute cardiopulmonary abnormality. Moderate chronic hiatal hernia. Electronically Signed   By: Genevie Ann M.D.   On: 08/29/2018 12:49    Procedures .Critical Care Performed by: Daleen Bo, MD Authorized by: Daleen Bo, MD   Critical care provider statement:    Critical care time (minutes):  35   Critical care start time:  08/29/2018 12:00 PM   Critical care end time:  08/29/2018 4:39 PM   Critical care time was exclusive of:  Separately billable procedures and treating other patients   Critical care was time spent personally by me on the following activities:  Blood draw for specimens, development of treatment plan with patient or surrogate, discussions with consultants, evaluation of patient's response to treatment, examination of patient, obtaining history from patient or surrogate, ordering and performing treatments and interventions, ordering and review of laboratory studies, pulse oximetry, re-evaluation of patient's condition, review of old charts and ordering and review of radiographic studies   (including critical care time)  Medications Ordered in ED Medications  sodium chloride 0.9 % bolus 1,000 mL (0 mLs Intravenous Stopped 08/29/18 1325)    acetaminophen (TYLENOL) tablet 1,000 mg (1,000 mg Oral Given 08/29/18 1526)  iopamidol (ISOVUE-300) 61 % injection 100 mL (100 mLs Intravenous Contrast Given 08/29/18 1503)     Initial Impression / Assessment and Plan / ED Course  I have reviewed the triage vital signs and the nursing notes.  Pertinent labs & imaging results that were available during my care of the patient were reviewed by me and considered in my medical decision making (see chart for details).  Clinical Course as of Aug 29 1638  Thu Aug 29, 2018  1459 Normal  Lipase, blood [EW]  1459 Normal except platelets low  CBC with Differential(!) [EW]  1459 Normal except albumin low  Comprehensive metabolic panel(!) [EW]  5465 Normal  Urinalysis, Routine w reflex microscopic(!) [EW]  1500 No infiltrate, or CHF, images reviewed by me  DG Chest Port 1 View [EW]  1500 He is currently in CAT scan.  I discussed the case findings up to this point with the daughter who remains concerned that something is wrong, I explained that so far the testing was very reassuring.   [EW]  6812 Reevaluation-patient is more alert, brighter, able to converse better.  She complains of "pain in my low back."  Patient was able to rollover cervical exam her back.  The back is nontender to palpation.  She then rolled onto her back and I palpate her abdomen which had mild diffuse tenderness.  CT scan of the abdomen pelvis, has been done, but not yet reported.  Daughter stated she was concerned about the patient's not eating for several weeks and wonders if she needs to see a GI specialist.  She previously had esophageal stretching about 3 months ago, by Dr. Laural Golden.  He stated that her esophagus was "curled because of rheumatoid arthritis."   [EW]    Clinical Course User Index [EW] Daleen Bo, MD     Patient Vitals for the past 24 hrs:  BP Temp Temp src Pulse Resp SpO2 Height Weight  08/29/18 1600 (!) 147/79 - - 73 18 96 % - -  08/29/18 1530 (!)  159/77 - - 74 13 96 % - -  08/29/18 1500 (!) 145/96 - - 80 16 92 % - -  08/29/18 1445 - - - 76 17 94 % - -  08/29/18 1430 (!) 151/89 - - 70 15 94 % - -  08/29/18 1415 - - - 76 12 97 % - -  08/29/18 1400 (!) 162/80 - - 75 16 94 % - -  08/29/18 1208 - 97.9 F (36.6 C) Rectal - - - - -  08/29/18 1200 (!) 142/71 - - 81 18 94 % - -  08/29/18 1143 (!) 143/68 (!) 97.3 F (36.3 C) Oral 80 16 96 % - -  08/29/18 1141 - - - - - - 5' (1.524 m) 54.4 kg    4:17 PM Reevaluation with update and discussion. After initial assessment and treatment, an updated evaluation reveals she continues to be alert and communicative, and what appears to be her baseline manner.  I had extensive discussion with the patient's daughter who was concerned about both the abdomen, and her brain.  She is worried that we might "miss something."  Agreed to send a urine culture, and do directed referrals to neurology and cardiology as an outpatient.  The patient was supposed to get a loop recorder placed during her recent hospitalization but was unable to cooperate with that placement.  She also has not seen neurology in follow-up yet following the recent diagnosis of multiple strokes seen on MRI imaging.  All questions were answered. Daleen Bo   Medical Decision Making: Altered mental status with abdominal pain.  Comprehensive evaluation in the ED with patient improving.  Doubt TIA or stroke.  Doubt colitis.  Doubt UTI.  Patient with likely multifactorial transient altered mental status, not requiring hospitalization at this time.  Outpatient follow-up is recommended for ongoing management.  CRITICAL CARE-yes Performed by: Daleen Bo  Nursing Notes Reviewed/ Care Coordinated Applicable Imaging Reviewed Interpretation of Laboratory Data incorporated into ED treatment  The patient appears reasonably screened and/or stabilized for discharge and I doubt any other medical condition or other [facility] requiring further screening,  evaluation, or treatment in the ED at this time prior to discharge.  Plan: Home Medications-continue usual medications; Home Treatments-encourage increased oral intake; return here if the recommended treatment, does not improve the symptoms; Recommended follow up-PCP, cardiology, neurology follow-up for ongoing management.   Final Clinical Impressions(s) / ED Diagnoses   Final diagnoses:  Transient alteration of awareness  Generalized abdominal pain    ED Discharge Orders         Ordered    Ambulatory referral to Neurology    Comments:  An appointment is requested in approximately: 1 week   08/29/18 1621  Ambulatory referral to Cardiac Electrophysiology     08/29/18 1621           Daleen Bo, MD 08/29/18 [phone]

## 2018-08-31 LAB — URINE CULTURE: Culture: NO GROWTH

## 2018-09-02 ENCOUNTER — Other Ambulatory Visit: Payer: Self-pay

## 2018-09-02 DIAGNOSIS — F0390 Unspecified dementia without behavioral disturbance: Secondary | ICD-10-CM | POA: Diagnosis not present

## 2018-09-02 DIAGNOSIS — I10 Essential (primary) hypertension: Secondary | ICD-10-CM | POA: Diagnosis not present

## 2018-09-02 DIAGNOSIS — R404 Transient alteration of awareness: Secondary | ICD-10-CM | POA: Diagnosis not present

## 2018-09-02 NOTE — Patient Outreach (Signed)
[facility]) Care Management  09/02/2018  [name] 1941-08-09 [id number]   Telephone Screen  Referral Date: 09/02/18 Referral Source: [facility] Dept. Referral Reason: " daughter is primary car giver-voiced she would like to see if they have resources to assist with caregiver support, able to pick up meds-feels like things are really tight and unsure if they can continue to pay for them" Insurance: [employer]   Outreach attempt # 1  to patient. Spoke with patient and verbal consent given to speak with caregiver/daughter-[name]. Daughter reports that she lives in the home with patient. Patient requires assistance with ADLs/IADLs. She denies any recent falls. She states that patient is currently getting HHPT from [facility]. She is making progress per daughter and using walker to assist with ambulation. She denies any recent falls. Family takes patient to appts. Daughter voices that she is interested in resources to assist with caring for patient in the home such as in home support and respite options. Daughter states she does not feel like patient would qualify for Medicaid as she owns property( a farm). Caregiver states that patient is taking about 12 meds. She denies any issues with affording meds at this time. She voices their only concerns is patient is taking Prolia and has to pay $250 q77months for medication. She is agreeable to pharmacy referral for possible assistance with medication cost. She denies any issues with managing patient;s meds at this time. Caregiver states that other than those two issues thy are managing and doing well and does not feel like she needs further services or assistance. Daughter gave verbal consent for referral to [facility] and pharmacy team for possible assistance.      Plan: RN CM will send [facility] SW referral for possible caregiver resources on in home support and respite options. RN CM will send [facility] pharmacy referral for possible med assistance.     Enzo Montgomery, RN,BSN,CCM [facility] Telephonic Care Management Coordinator Direct Phone: [phone] Toll Free: [phone] Fax: [phone]

## 2018-09-03 ENCOUNTER — Ambulatory Visit: Payer: PPO

## 2018-09-03 ENCOUNTER — Other Ambulatory Visit: Payer: Self-pay

## 2018-09-03 NOTE — Patient Outreach (Signed)
[facility]) Care Management  09/03/2018  [name] [birth year] [id number]   Unsuccessful outreach to patient's daughter, [name], regarding social work referral for home support and respite services.  Voicemail box was not set up.  Unsuccessful outreach letter mailed.  BSW will attempt to reach again within four business days.   Ronn Melena, BSW Social Worker [phone]

## 2018-09-05 DIAGNOSIS — E782 Mixed hyperlipidemia: Secondary | ICD-10-CM | POA: Diagnosis not present

## 2018-09-05 DIAGNOSIS — I10 Essential (primary) hypertension: Secondary | ICD-10-CM | POA: Diagnosis not present

## 2018-09-05 DIAGNOSIS — E039 Hypothyroidism, unspecified: Secondary | ICD-10-CM | POA: Diagnosis not present

## 2018-09-09 ENCOUNTER — Ambulatory Visit: Payer: Self-pay

## 2018-09-10 ENCOUNTER — Other Ambulatory Visit: Payer: Self-pay

## 2018-09-10 NOTE — Patient Outreach (Signed)
[facility]) Care Management  09/10/2018  [name] [birth year] [id number]   Successful outreach to patient's daughter/caregiver, [name], regarding social work referral for in-home support and respite services.   Ms. Elliot Dally reported that she and her brother are the sole caregivers for [name].  Her brother is currently working but Ms. Elliot Dally is not working due to providing care for her mother.  She expressed concern about having no income and not having health insurance coverage for herself.   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  Although [name] is under the income limit for Medicaid, they have not applied because she owns a farm and three houses which would likely impact eligibility.  The family has had minimal conversation about selling this property because it has been in the family for a long time.  BSW agreed to send Ms. Elliot Dally resources for in-home and respite providers although these services would have to be paid for out-of-pocket at this time.  Ms. Donnie Coffin said that she has considered trying to find employment that would allow her to work from home. BSW mailed information on the following to her: Aging Disability and [place] and Hospice Directory BSW will follow up next week to ensure receipt of resources.   Ronn Melena, BSW Social Worker [phone]

## 2018-09-13 DIAGNOSIS — N939 Abnormal uterine and vaginal bleeding, unspecified: Secondary | ICD-10-CM | POA: Diagnosis not present

## 2018-09-20 ENCOUNTER — Ambulatory Visit: Payer: Self-pay

## 2018-09-20 ENCOUNTER — Other Ambulatory Visit: Payer: Self-pay

## 2018-09-20 NOTE — Patient Outreach (Signed)
[facility]) Care Management  09/20/2018  [name] Tappen [birth year] [id number]   Unsuccessful outreach to patient's daughter, [name], to ensure receipt of resources mailed on 09/10/18.  Voicemail box not set up so unable to leave message.  Will attempt to reach again within four business days.  Ronn Melena, BSW Social Worker [phone]

## 2018-09-24 ENCOUNTER — Other Ambulatory Visit: Payer: Self-pay

## 2018-09-24 ENCOUNTER — Institutional Professional Consult (permissible substitution): Payer: PPO | Admitting: Internal Medicine

## 2018-09-24 NOTE — Patient Outreach (Signed)
[facility]) Care Management  09/24/2018  [name] 1941-02-09 [id number]   Successful follow up call to patient's daughter, [name], regarding social work referral for in-home support and respite options.  Ms. Nayak confirmed receipt of resources that were mailed to her on 09/10/18;  [facility] [employer] [facility] [employer], [employer], [place], [place] and [employer].  She said that she reviewed all the information provided and did not have any questions.  BSW is closing case at this time but encouraged her to call if any questions or additional needs arise.   Ronn Melena, BSW Social Worker [phone]

## 2018-09-25 ENCOUNTER — Encounter: Payer: Self-pay | Admitting: Internal Medicine

## 2018-10-02 ENCOUNTER — Encounter: Payer: Self-pay | Admitting: Neurology

## 2018-10-02 ENCOUNTER — Ambulatory Visit: Payer: PPO | Admitting: Neurology

## 2018-10-02 VITALS — BP 140/76 | HR 78 | Ht 60.0 in | Wt 125.2 lb

## 2018-10-02 DIAGNOSIS — I639 Cerebral infarction, unspecified: Secondary | ICD-10-CM | POA: Diagnosis not present

## 2018-10-02 DIAGNOSIS — F0150 Vascular dementia without behavioral disturbance: Secondary | ICD-10-CM | POA: Diagnosis not present

## 2018-10-02 NOTE — Patient Instructions (Addendum)
I had a long d/w patient and her daughter about her recent cryptogenic strokes,baseline dementia risk for recurrent stroke/TIAs, personally independently reviewed imaging studies and stroke evaluation results and answered questions.Continue Plavix  for secondary stroke prevention and maintain strict control of hypertension with blood pressure goal below 130/90, diabetes with hemoglobin A1c goal below 6.5% and lipids with LDL cholesterol goal below 70 mg/dL. I also advised the patient to eat a healthy diet with plenty of whole grains, cereals, fruits and vegetables, exercise regularly and maintain ideal body weight.  Referral for outpatient loop recorder insertion for paroxysmal A. fib.  Patient and family were counseled about fall risk prevention and the need to use a walker at all times.  Continue Namenda at 10 mg twice daily and increase the dose of Aricept to 10 mg daily for her dementia.  Followup in the future with my nurse practitioner in 6 months or call earlier if necessary   Fall Prevention in the Home, Adult Falls can cause injuries. They can happen to people of all ages. There are many things you can do to make your home safe and to help prevent falls. Ask for help when making these changes, if needed. What actions can I take to prevent falls? General Instructions  Use good lighting in all rooms. Replace any light bulbs that burn out.  Turn on the lights when you go into a dark area. Use night-lights.  Keep items that you use often in easy-to-reach places. Lower the shelves around your home if necessary.  Set up your furniture so you have a clear path. Avoid moving your furniture around.  Do not have throw rugs and other things on the floor that can make you trip.  Avoid walking on wet floors.  If any of your floors are uneven, fix them.  Add color or contrast paint or tape to clearly mark and help you see: ? Any grab bars or handrails. ? First and last steps of stairways. ? Where  the edge of each step is.  If you use a stepladder: ? Make sure that it is fully opened. Do not climb a closed stepladder. ? Make sure that both sides of the stepladder are locked into place. ? Ask someone to hold the stepladder for you while you use it.  If there are any pets around you, be aware of where they are. What can I do in the bathroom?      Keep the floor dry. Clean up any water that spills onto the floor as soon as it happens.  Remove soap buildup in the tub or shower regularly.  Use non-skid mats or decals on the floor of the tub or shower.  Attach bath mats securely with double-sided, non-slip rug tape.  If you need to sit down in the shower, use a plastic, non-slip stool.  Install grab bars by the toilet and in the tub and shower. Do not use towel bars as grab bars. What can I do in the bedroom?  Make sure that you have a light by your bed that is easy to reach.  Do not use any sheets or blankets that are too big for your bed. They should not hang down onto the floor.  Have a firm chair that has side arms. You can use this for support while you get dressed. What can I do in the kitchen?  Clean up any spills right away.  If you need to reach something above you, use a strong step  stool that has a grab bar.  Keep electrical cords out of the way.  Do not use floor polish or wax that makes floors slippery. If you must use wax, use non-skid floor wax. What can I do with my stairs?  Do not leave any items on the stairs.  Make sure that you have a light switch at the top of the stairs and the bottom of the stairs. If you do not have them, ask someone to add them for you.  Make sure that there are handrails on both sides of the stairs, and use them. Fix handrails that are broken or loose. Make sure that handrails are as long as the stairways.  Install non-slip stair treads on all stairs in your home.  Avoid having throw rugs at the top or bottom of the stairs.  If you do have throw rugs, attach them to the floor with carpet tape.  Choose a carpet that does not hide the edge of the steps on the stairway.  Check any carpeting to make sure that it is firmly attached to the stairs. Fix any carpet that is loose or worn. What can I do on the outside of my home?  Use bright outdoor lighting.  Regularly fix the edges of walkways and driveways and fix any cracks.  Remove anything that might make you trip as you walk through a door, such as a raised step or threshold.  Trim any bushes or trees on the path to your home.  Regularly check to see if handrails are loose or broken. Make sure that both sides of any steps have handrails.  Install guardrails along the edges of any raised decks and porches.  Clear walking paths of anything that might make someone trip, such as tools or rocks.  Have any leaves, snow, or ice cleared regularly.  Use sand or salt on walking paths during winter.  Clean up any spills in your garage right away. This includes grease or oil spills. What other actions can I take?  Wear shoes that: ? Have a low heel. Do not wear high heels. ? Have rubber bottoms. ? Are comfortable and fit you well. ? Are closed at the toe. Do not wear open-toe sandals.  Use tools that help you move around (mobility aids) if they are needed. These include: ? Canes. ? Walkers. ? Scooters. ? Crutches.  Review your medicines with your doctor. Some medicines can make you feel dizzy. This can increase your chance of falling. Ask your doctor what other things you can do to help prevent falls. Where to find more information  Centers for Disease Control and Prevention, [employer]: https://garcia.biz/  [employer] on Aging: BrainJudge.co.uk Contact a doctor if:  You are afraid of falling at home.  You feel weak, drowsy, or dizzy at home.  You fall at home. Summary  There are many simple things that you can do to make your home safe  and to help prevent falls.  Ways to make your home safe include removing tripping hazards and installing grab bars in the bathroom.  Ask for help when making these changes in your home. This information is not intended to replace advice given to you by your health care provider. Make sure you discuss any questions you have with your health care provider. Document Released: 06/17/2009 Document Revised: 04/05/2017 Document Reviewed: 04/05/2017 Elsevier Interactive Patient Education  2019 [employer].

## 2018-10-02 NOTE — Progress Notes (Signed)
[facility] [address] [place]. [place] [address] [phone]       OFFICE FOLLOW-UP NOTE  [name] Date of Birth:  27-Nov-1940 Medical Record Number:  [id number]   HPI: [name] is a 78 year old pleasant Caucasian lady seen today for initial office visit following hospital admission for stroke.  She is accompanied by her daughter.  History is obtained from them and review of electronic medical records.  I have personally reviewed imaging films in PACS.  She has past medical history of dementia, hypertension, hypothyroidism, neuropathy, rheumatoid arthritis, colitis and hypokalemia.  She presented with feeling more weak than normal and confusion for 3 to 4 days prior to admission.  CT scan of the head showed age-indeterminate basal ganglia infarct and MRI showed small punctate multiple embolic-looking infarcts in both cerebral hemispheres.  MRI also showed multifocal areas of susceptibility within the posterior circulation compatible with microhemorrhages from small vessel disease.  She was [facility] to [facility] for further evaluation.  Transthoracic echo showed mildly dilated left atrium but normal ejection fraction.  She had previously been on aspirin which had been held following a recent episode of upper GI bleeding 2 months ago.  CT angiogram of the head and neck showed no large vessel occlusion.  Carotid ultra sound showed no significant extracranial stenosis.  Transesophageal echocardiogram showed no evidence of endocarditis, clots or PFO.  Loop recorder was recommended but the patient refused it as she was very anxious.  30-day outpatient heart monitor was suggested but this has not been done.  LDL cholesterol was 33 mg.  Hemoglobin A1c was 5.0.  Patient was allergic to aspirin and she was placed on Plavix at the time of discharge.  The patient had dementia at baseline for which she was already on Aricept 5 mg and Namenda 10 mg twice  daily.  She had abdominal pain which was diagnosed as colitis after CT scan of the abdomen and pelvis and she was treated with Cipro and Flagyl.  Patient is presently doing well without any abdominal pain.  She but family feels her cognition and memory difficulties seem to have worsened after the stroke.  She was seen in the ER 2 weeks ago for transient confusion but she recovered quickly.  Brain imaging was not repeated.  She was able to ambulate with a walker.  She has had no recent falls.  She is tolerating Plavix well without bleeding or bruising.  Her blood pressure is well controlled and today it is 140/76 ROS:   14 system review of systems is positive for hearing loss, eye itching, leg swelling, incontinence, tremors, confusion, snoring, daytime sleepiness, skin moles, leg swelling and all other systems negative  PMH:  Past Medical History:  Diagnosis Date  . Anxiety   . Arm fracture, left   . Breast cancer ([facility])   . Bulging disc   . Cardioembolic stroke ([facility]) 04/17/4817  . Confusion   . Depression   . Difficulty swallowing solids   . GERD (gastroesophageal reflux disease)   . Hypertension   . Hypothyroidism   . Invasive ductal carcinoma of left breast ([facility]) 02/24/2014  . Neuropathy   . Osteoporosis   . 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 Rotator cuff disorder, right   . Scoliosis   . Shortness of breath dyspnea    "I have reactive airway disease".    Social History:  Social History   Socioeconomic History  . Marital status:  Widowed    Spouse name: Not on file  . Number of children: Not on file  . Years of education: Not on file  . Highest education level: Not on file  Occupational History  . Not on file  Social Needs  . Financial resource strain: Not on file  . Food insecurity:    Worry: Not on file    Inability: Not on file  . Transportation needs:    Medical: Not on file    Non-medical: Not on file  Tobacco Use  . Smoking status: Never Smoker  . Smokeless  tobacco: Never Used  Substance and Sexual Activity  . Alcohol use: No  . Drug use: No  . Sexual activity: Never    Birth control/protection: None  Lifestyle  . Physical activity:    Days per week: Not on file    Minutes per session: Not on file  . Stress: Not on file  Relationships  . Social connections:    Talks on phone: Not on file    Gets together: Not on file    Attends religious service: Not on file    Active member of club or organization: Not on file    Attends meetings of clubs or organizations: Not on file    Relationship status: Not on file  . Intimate partner violence:    Fear of current or ex partner: Not on file    Emotionally abused: Not on file    Physically abused: Not on file    Forced sexual activity: Not on file  Other Topics Concern  . Not on file  Social History Narrative  . Not on file    Medications:   Current Outpatient Medications on File Prior to Visit  Medication Sig Dispense Refill  . albuterol (PROAIR HFA) 108 (90 Base) MCG/ACT inhaler Inhale 2 puffs into the lungs every 6 (six) hours as needed for wheezing or shortness of breath.     . Calcium Carbonate-Vitamin D (CALTRATE 600+D) 600-400 MG-UNIT tablet Take 1 tablet by mouth 2 (two) times daily.    . cetirizine (ZYRTEC) 10 MG tablet Take 10 mg by mouth daily as needed for allergies.     . Cholecalciferol (VITAMIN D) 2000 UNITS CAPS Take 2,000 Units by mouth daily.     . clopidogrel (PLAVIX) 75 MG tablet Take 1 tablet (75 mg total) by mouth daily. 30 tablet 0  . Cranberry 125 MG TABS Take 1 tablet by mouth 3 (three) times daily as needed.    Marland Kitchen denosumab (PROLIA) 60 MG/ML SOLN injection Inject 60 mg into the skin every 6 (six) months. Administer in upper arm, thigh, or abdomen, got in June 2019    . donepezil (ARICEPT) 5 MG tablet Take 10 mg by mouth at bedtime.    . DULoxetine (CYMBALTA) 60 MG capsule Take 60 mg by mouth daily.    . folic acid (FOLVITE) 676 MCG tablet Take 800 mcg by mouth  daily.    Marland Kitchen HYDROcodone-acetaminophen (NORCO/VICODIN) 5-325 MG tablet Take 1 tablet by mouth daily as needed for pain.    Marland Kitchen lisinopril (PRINIVIL,ZESTRIL) 10 MG tablet Take 5 mg by mouth daily. Son says she takes it as needed when she feels her BP is high, she will check it and then take it if needed.    . Magnesium 500 MG CAPS Take 500 mg by mouth daily.     . memantine (NAMENDA) 10 MG tablet Take 10 mg by mouth 2 (two) times daily.    Marland Kitchen  methotrexate (RHEUMATREX) 2.5 MG tablet Take 22.5 mg by mouth once a week. Every Monday    . omeprazole (PRILOSEC) 20 MG capsule Take 20 mg by mouth 2 (two) times daily before a meal.    . OVER THE COUNTER MEDICATION Take 8 oz by mouth daily as needed (leg pain). Tonic Water with Quinine    . polyethylene glycol (MIRALAX / GLYCOLAX) packet Take 17 g by mouth daily. 14 each 0  . potassium chloride (K-DUR) 10 MEQ tablet Take 10 mEq by mouth at bedtime.    . senna (EQ NATURAL VEGETABLE LAXATIVE) 8.6 MG tablet Take 1 tablet by mouth daily as needed for constipation.    . Zinc 50 MG CAPS Take 50 mg by mouth daily.     . pantoprazole (PROTONIX) 40 MG tablet Take 1 tablet (40 mg total) by mouth daily. (Patient not taking: Reported on 10/02/2018) 90 tablet 3   No current facility-administered medications on file prior to visit.     Allergies:   Allergies  Allergen Reactions  . Celecoxib Swelling and Other (See Comments)  . Ibuprofen Swelling and Other (See Comments)  . Diphenhydramine Rash and Other (See Comments)    Wide awake.   Micheline Maze [Propoxyphene N-Acetaminophen] Nausea And Vomiting    Physical Exam General: Frail petite elderly Caucasian lady, seated, in no evident distress Head: head normocephalic and atraumatic.  Neck: supple with no carotid or supraclavicular bruits Cardiovascular: regular rate and rhythm, no murmurs Musculoskeletal: Mild kyphoscoliosis Skin:  no rash/petichiae Vascular:  Normal pulses all extremities Vitals:   10/02/18 1050    BP: 140/76  Pulse: 78   Neurologic Exam Mental Status: Awake and fully alert. Oriented to place and time. Recent and remote memory diminished. Attention span, concentration and fund of knowledge poor mood and affect appropriate.  Recall 0/3.  Able to name only 2 animals with forelegs Mini-Mental status exam scored 13/30 with deficits in orientation, attention, calculation, recall.  Unable to copy intersecting pentagons.  Clock drawing 2/4.  Geriatric depression scale score 3. Cranial Nerves: Fundoscopic exam reveals sharp disc margins. Pupils equal, briskly reactive to light. Extraocular movements full without nystagmus. Visual fields full to confrontation. Hearing intact. Facial sensation intact. Face, tongue, palate moves normally and symmetrically.  Motor: Normal bulk and tone. Normal strength in all tested extremity muscles.  Mild fine action tremor of outstretched upper extremities bilaterally.  No cogwheel rigidity Sensory.: intact to touch ,pinprick .position and vibratory sensation.  Coordination: Rapid alternating movements normal in all extremities. Finger-to-nose and heel-to-shin performed accurately bilaterally. Gait and Station: Arises from chair without difficulty. Stance is slightly stooped l. Gait demonstrates normal stride length and balance but uses a walker.  Unable to heel, toe and tandem walk    Reflexes: 1+ and symmetric. Toes downgoing.   NIHSS  2 Modified Rankin  3  ASSESSMENT: 78 year old lady with history of dementia, hypertension, hypothyroidism, neuropathy, rheumatoid arthritis, history of upper GI bleeding with bilateral punctate anterior and posterior circulation infarcts of cryptogenic etiology in November 2019.  She also has underlying dementia which has gotten worse.     PLAN: I had a long d/w patient and her daughter about her recent cryptogenic strokes,baseline dementia risk for recurrent stroke/TIAs, personally independently reviewed imaging studies and  stroke evaluation results and answered questions.Continue Plavix  for secondary stroke prevention and maintain strict control of hypertension with blood pressure goal below 130/90, diabetes with hemoglobin A1c goal below 6.5% and lipids with LDL cholesterol goal below 70  mg/dL. I also advised the patient to eat a healthy diet with plenty of whole grains, cereals, fruits and vegetables, exercise regularly and maintain ideal body weight.  Referral for outpatient loop recorder insertion for paroxysmal A. fib.  Patient and family were counseled about fall risk prevention and the need to use a walker at all times.  Continue Namenda at 10 mg twice daily and increase the dose of Aricept to 10 mg daily for her dementia.  Followup in the future with my nurse practitioner in 6 months or call earlier if necessary Greater than 50% of time during this 25 minute visit was spent on counseling,explanation of diagnosis of strokes and dementia, planning of further management, discussion with patient and family and coordination of care Antony Contras, MD Medical Director [place] Pager: [phone] 10/02/2018 4:11 PM Note: This document was prepared with digital dictation and possible smart phrase technology. Any transcriptional errors that result from this process are unintentional

## 2018-10-08 ENCOUNTER — Other Ambulatory Visit: Payer: Self-pay

## 2018-10-08 ENCOUNTER — Telehealth: Payer: Self-pay | Admitting: Neurology

## 2018-10-08 MED ORDER — DONEPEZIL HCL 10 MG PO TABS: 10.0000 mg | ORAL_TABLET | Freq: Every day | ORAL | 1 refills | Status: DC

## 2018-10-08 NOTE — Telephone Encounter (Signed)
Medication 10mg  of aricept sent via epic to [place] via epic.

## 2018-10-08 NOTE — Telephone Encounter (Signed)
Pt daughter(on DPR) is calling re: the donepezil (ARICEPT) 5 MG tablet on office visit daughter states Dr Leonie Man was going to increase from 5-10mg . Pt doubled up and has now been 2 days without the donepezil (ARICEPT) 5 MG tablet Daughter is asking that the 10mg  of the donepezil (ARICEPT)  Be called in to [facility]

## 2018-10-29 ENCOUNTER — Telehealth: Payer: Self-pay

## 2018-10-29 NOTE — Telephone Encounter (Signed)
Spoke with [name] (patients daughter) on 10/23/18 and 10/29/18 and she stated she will call [employer] to get her mom established so that we can get her in for her Prolia injection.

## 2018-11-05 DIAGNOSIS — M0579 Rheumatoid arthritis with rheumatoid factor of multiple sites without organ or systems involvement: Secondary | ICD-10-CM | POA: Diagnosis not present

## 2018-11-05 DIAGNOSIS — I639 Cerebral infarction, unspecified: Secondary | ICD-10-CM | POA: Diagnosis not present

## 2018-11-05 DIAGNOSIS — Z79899 Other long term (current) drug therapy: Secondary | ICD-10-CM | POA: Diagnosis not present

## 2018-12-19 DIAGNOSIS — Z0000 Encounter for general adult medical examination without abnormal findings: Secondary | ICD-10-CM | POA: Diagnosis not present

## 2018-12-30 ENCOUNTER — Ambulatory Visit: Payer: PPO | Admitting: Internal Medicine

## 2019-01-02 ENCOUNTER — Telehealth: Payer: Self-pay

## 2019-01-02 NOTE — Telephone Encounter (Signed)
Left message with son to see if there has been a decision on Prolia for this patient and to se if they have applied for [employer] grant yet-was told that daughter would call me back to let me know

## 2019-01-29 DIAGNOSIS — I700 Atherosclerosis of aorta: Secondary | ICD-10-CM | POA: Diagnosis not present

## 2019-01-29 DIAGNOSIS — M479 Spondylosis, unspecified: Secondary | ICD-10-CM | POA: Diagnosis not present

## 2019-01-29 DIAGNOSIS — K5731 Diverticulosis of large intestine without perforation or abscess with bleeding: Secondary | ICD-10-CM | POA: Diagnosis not present

## 2019-01-29 DIAGNOSIS — I6523 Occlusion and stenosis of bilateral carotid arteries: Secondary | ICD-10-CM | POA: Diagnosis not present

## 2019-01-29 DIAGNOSIS — D529 Folate deficiency anemia, unspecified: Secondary | ICD-10-CM | POA: Diagnosis not present

## 2019-01-29 DIAGNOSIS — Z8673 Personal history of transient ischemic attack (TIA), and cerebral infarction without residual deficits: Secondary | ICD-10-CM | POA: Diagnosis not present

## 2019-01-29 DIAGNOSIS — F0391 Unspecified dementia with behavioral disturbance: Secondary | ICD-10-CM | POA: Diagnosis not present

## 2019-01-29 DIAGNOSIS — R404 Transient alteration of awareness: Secondary | ICD-10-CM | POA: Diagnosis not present

## 2019-01-29 DIAGNOSIS — E440 Moderate protein-calorie malnutrition: Secondary | ICD-10-CM | POA: Diagnosis not present

## 2019-01-29 DIAGNOSIS — I10 Essential (primary) hypertension: Secondary | ICD-10-CM | POA: Diagnosis not present

## 2019-01-29 DIAGNOSIS — S22050K Wedge compression fracture of T5-T6 vertebra, subsequent encounter for fracture with nonunion: Secondary | ICD-10-CM | POA: Diagnosis not present

## 2019-01-29 DIAGNOSIS — M069 Rheumatoid arthritis, unspecified: Secondary | ICD-10-CM | POA: Diagnosis not present

## 2019-01-29 DIAGNOSIS — M4186 Other forms of scoliosis, lumbar region: Secondary | ICD-10-CM | POA: Diagnosis not present

## 2019-01-29 DIAGNOSIS — R32 Unspecified urinary incontinence: Secondary | ICD-10-CM | POA: Diagnosis not present

## 2019-01-29 DIAGNOSIS — F419 Anxiety disorder, unspecified: Secondary | ICD-10-CM | POA: Diagnosis not present

## 2019-01-29 DIAGNOSIS — R110 Nausea: Secondary | ICD-10-CM | POA: Diagnosis not present

## 2019-01-29 DIAGNOSIS — N281 Cyst of kidney, acquired: Secondary | ICD-10-CM | POA: Diagnosis not present

## 2019-01-29 DIAGNOSIS — M4696 Unspecified inflammatory spondylopathy, lumbar region: Secondary | ICD-10-CM | POA: Diagnosis not present

## 2019-01-29 DIAGNOSIS — M47812 Spondylosis without myelopathy or radiculopathy, cervical region: Secondary | ICD-10-CM | POA: Diagnosis not present

## 2019-01-29 DIAGNOSIS — R319 Hematuria, unspecified: Secondary | ICD-10-CM | POA: Diagnosis not present

## 2019-01-29 DIAGNOSIS — K449 Diaphragmatic hernia without obstruction or gangrene: Secondary | ICD-10-CM | POA: Diagnosis not present

## 2019-01-30 ENCOUNTER — Telehealth: Payer: Self-pay

## 2019-01-30 NOTE — Telephone Encounter (Signed)
Canceled 12/30/18 appt. Called pt to reschedule. LVM requesting returned call.

## 2019-03-31 DIAGNOSIS — Z79899 Other long term (current) drug therapy: Secondary | ICD-10-CM | POA: Diagnosis not present

## 2019-03-31 DIAGNOSIS — M0579 Rheumatoid arthritis with rheumatoid factor of multiple sites without organ or systems involvement: Secondary | ICD-10-CM | POA: Diagnosis not present

## 2019-04-02 ENCOUNTER — Ambulatory Visit: Payer: Self-pay | Admitting: Adult Health

## 2019-04-08 ENCOUNTER — Ambulatory Visit: Payer: Self-pay | Admitting: Adult Health

## 2019-04-18 ENCOUNTER — Inpatient Hospital Stay (HOSPITAL_COMMUNITY)
Admission: EM | Admit: 2019-04-18 | Discharge: 2019-04-24 | DRG: 470 | Disposition: A | Discharge status: Home Health | Payer: PPO | Attending: Internal Medicine | Admitting: Internal Medicine

## 2019-04-18 ENCOUNTER — Emergency Department (HOSPITAL_COMMUNITY): Payer: PPO

## 2019-04-18 ENCOUNTER — Other Ambulatory Visit: Payer: Self-pay

## 2019-04-18 DIAGNOSIS — G629 Polyneuropathy, unspecified: Secondary | ICD-10-CM | POA: Diagnosis present

## 2019-04-18 DIAGNOSIS — S72059A Unspecified fracture of head of unspecified femur, initial encounter for closed fracture: Secondary | ICD-10-CM

## 2019-04-18 DIAGNOSIS — M069 Rheumatoid arthritis, unspecified: Secondary | ICD-10-CM | POA: Diagnosis present

## 2019-04-18 DIAGNOSIS — M5416 Radiculopathy, lumbar region: Secondary | ICD-10-CM | POA: Diagnosis not present

## 2019-04-18 DIAGNOSIS — Z90710 Acquired absence of both cervix and uterus: Secondary | ICD-10-CM

## 2019-04-18 DIAGNOSIS — N289 Disorder of kidney and ureter, unspecified: Secondary | ICD-10-CM | POA: Diagnosis not present

## 2019-04-18 DIAGNOSIS — Z209 Contact with and (suspected) exposure to unspecified communicable disease: Secondary | ICD-10-CM | POA: Diagnosis not present

## 2019-04-18 DIAGNOSIS — E8889 Other specified metabolic disorders: Secondary | ICD-10-CM | POA: Diagnosis present

## 2019-04-18 DIAGNOSIS — S0990XA Unspecified injury of head, initial encounter: Secondary | ICD-10-CM | POA: Diagnosis not present

## 2019-04-18 DIAGNOSIS — Y92009 Unspecified place in unspecified non-institutional (private) residence as the place of occurrence of the external cause: Secondary | ICD-10-CM | POA: Diagnosis not present

## 2019-04-18 DIAGNOSIS — Z79899 Other long term (current) drug therapy: Secondary | ICD-10-CM | POA: Diagnosis not present

## 2019-04-18 DIAGNOSIS — Z79891 Long term (current) use of opiate analgesic: Secondary | ICD-10-CM | POA: Diagnosis not present

## 2019-04-18 DIAGNOSIS — R52 Pain, unspecified: Secondary | ICD-10-CM | POA: Diagnosis not present

## 2019-04-18 DIAGNOSIS — E039 Hypothyroidism, unspecified: Secondary | ICD-10-CM | POA: Diagnosis present

## 2019-04-18 DIAGNOSIS — I639 Cerebral infarction, unspecified: Secondary | ICD-10-CM | POA: Diagnosis not present

## 2019-04-18 DIAGNOSIS — I10 Essential (primary) hypertension: Secondary | ICD-10-CM | POA: Diagnosis present

## 2019-04-18 DIAGNOSIS — D696 Thrombocytopenia, unspecified: Secondary | ICD-10-CM | POA: Diagnosis present

## 2019-04-18 DIAGNOSIS — Z7902 Long term (current) use of antithrombotics/antiplatelets: Secondary | ICD-10-CM

## 2019-04-18 DIAGNOSIS — D509 Iron deficiency anemia, unspecified: Secondary | ICD-10-CM | POA: Diagnosis not present

## 2019-04-18 DIAGNOSIS — Z8673 Personal history of transient ischemic attack (TIA), and cerebral infarction without residual deficits: Secondary | ICD-10-CM | POA: Diagnosis not present

## 2019-04-18 DIAGNOSIS — R739 Hyperglycemia, unspecified: Secondary | ICD-10-CM | POA: Diagnosis not present

## 2019-04-18 DIAGNOSIS — Z888 Allergy status to other drugs, medicaments and biological substances status: Secondary | ICD-10-CM

## 2019-04-18 DIAGNOSIS — M79605 Pain in left leg: Secondary | ICD-10-CM | POA: Diagnosis not present

## 2019-04-18 DIAGNOSIS — Z01811 Encounter for preprocedural respiratory examination: Secondary | ICD-10-CM

## 2019-04-18 DIAGNOSIS — Z7401 Bed confinement status: Secondary | ICD-10-CM | POA: Diagnosis not present

## 2019-04-18 DIAGNOSIS — Z853 Personal history of malignant neoplasm of breast: Secondary | ICD-10-CM | POA: Diagnosis not present

## 2019-04-18 DIAGNOSIS — S3991XA Unspecified injury of abdomen, initial encounter: Secondary | ICD-10-CM | POA: Diagnosis not present

## 2019-04-18 DIAGNOSIS — K219 Gastro-esophageal reflux disease without esophagitis: Secondary | ICD-10-CM | POA: Diagnosis present

## 2019-04-18 DIAGNOSIS — F419 Anxiety disorder, unspecified: Secondary | ICD-10-CM | POA: Diagnosis present

## 2019-04-18 DIAGNOSIS — F329 Major depressive disorder, single episode, unspecified: Secondary | ICD-10-CM | POA: Diagnosis present

## 2019-04-18 DIAGNOSIS — Z01818 Encounter for other preprocedural examination: Secondary | ICD-10-CM | POA: Diagnosis not present

## 2019-04-18 DIAGNOSIS — Z03818 Encounter for observation for suspected exposure to other biological agents ruled out: Secondary | ICD-10-CM | POA: Diagnosis not present

## 2019-04-18 DIAGNOSIS — S72002D Fracture of unspecified part of neck of left femur, subsequent encounter for closed fracture with routine healing: Secondary | ICD-10-CM | POA: Diagnosis not present

## 2019-04-18 DIAGNOSIS — R4182 Altered mental status, unspecified: Secondary | ICD-10-CM | POA: Diagnosis not present

## 2019-04-18 DIAGNOSIS — D72829 Elevated white blood cell count, unspecified: Secondary | ICD-10-CM | POA: Diagnosis not present

## 2019-04-18 DIAGNOSIS — Z20828 Contact with and (suspected) exposure to other viral communicable diseases: Secondary | ICD-10-CM | POA: Diagnosis present

## 2019-04-18 DIAGNOSIS — N179 Acute kidney failure, unspecified: Secondary | ICD-10-CM | POA: Diagnosis not present

## 2019-04-18 DIAGNOSIS — M810 Age-related osteoporosis without current pathological fracture: Secondary | ICD-10-CM | POA: Diagnosis present

## 2019-04-18 DIAGNOSIS — S72052D Unspecified fracture of head of left femur, subsequent encounter for closed fracture with routine healing: Secondary | ICD-10-CM | POA: Diagnosis not present

## 2019-04-18 DIAGNOSIS — M25552 Pain in left hip: Secondary | ICD-10-CM | POA: Diagnosis present

## 2019-04-18 DIAGNOSIS — E538 Deficiency of other specified B group vitamins: Secondary | ICD-10-CM | POA: Diagnosis not present

## 2019-04-18 DIAGNOSIS — S72002A Fracture of unspecified part of neck of left femur, initial encounter for closed fracture: Secondary | ICD-10-CM

## 2019-04-18 DIAGNOSIS — W19XXXA Unspecified fall, initial encounter: Secondary | ICD-10-CM

## 2019-04-18 DIAGNOSIS — Z96642 Presence of left artificial hip joint: Secondary | ICD-10-CM | POA: Diagnosis not present

## 2019-04-18 DIAGNOSIS — F0390 Unspecified dementia without behavioral disturbance: Secondary | ICD-10-CM | POA: Diagnosis present

## 2019-04-18 DIAGNOSIS — Z8249 Family history of ischemic heart disease and other diseases of the circulatory system: Secondary | ICD-10-CM

## 2019-04-18 DIAGNOSIS — D62 Acute posthemorrhagic anemia: Secondary | ICD-10-CM | POA: Diagnosis present

## 2019-04-18 DIAGNOSIS — W1830XA Fall on same level, unspecified, initial encounter: Secondary | ICD-10-CM | POA: Diagnosis present

## 2019-04-18 DIAGNOSIS — Z886 Allergy status to analgesic agent status: Secondary | ICD-10-CM

## 2019-04-18 DIAGNOSIS — R0902 Hypoxemia: Secondary | ICD-10-CM | POA: Diagnosis not present

## 2019-04-18 DIAGNOSIS — S72031A Displaced midcervical fracture of right femur, initial encounter for closed fracture: Secondary | ICD-10-CM | POA: Diagnosis not present

## 2019-04-18 DIAGNOSIS — S72009A Fracture of unspecified part of neck of unspecified femur, initial encounter for closed fracture: Secondary | ICD-10-CM

## 2019-04-18 DIAGNOSIS — F418 Other specified anxiety disorders: Secondary | ICD-10-CM | POA: Diagnosis not present

## 2019-04-18 DIAGNOSIS — M2550 Pain in unspecified joint: Secondary | ICD-10-CM | POA: Diagnosis not present

## 2019-04-18 DIAGNOSIS — S199XXA Unspecified injury of neck, initial encounter: Secondary | ICD-10-CM | POA: Diagnosis not present

## 2019-04-18 LAB — CBC WITH DIFFERENTIAL/PLATELET
Abs Immature Granulocytes: 0.06 10*3/uL (ref 0.00–0.07)
Basophils Absolute: 0 10*3/uL (ref 0.0–0.1)
Basophils Relative: 0 %
Eosinophils Absolute: 0 10*3/uL (ref 0.0–0.5)
Eosinophils Relative: 0 %
HCT: 38.4 % (ref 36.0–46.0)
Hemoglobin: 12.4 g/dL (ref 12.0–15.0)
Immature Granulocytes: 1 %
Lymphocytes Relative: 5 %
Lymphs Abs: 0.5 10*3/uL — ABNORMAL LOW (ref 0.7–4.0)
MCH: 31.5 pg (ref 26.0–34.0)
MCHC: 32.3 g/dL (ref 30.0–36.0)
MCV: 97.5 fL (ref 80.0–100.0)
Monocytes Absolute: 0.3 10*3/uL (ref 0.1–1.0)
Monocytes Relative: 4 %
Neutro Abs: 8.1 10*3/uL — ABNORMAL HIGH (ref 1.7–7.7)
Neutrophils Relative %: 90 %
Platelets: 138 10*3/uL — ABNORMAL LOW (ref 150–400)
RBC: 3.94 MIL/uL (ref 3.87–5.11)
RDW: 16.9 % — ABNORMAL HIGH (ref 11.5–15.5)
WBC: 9 10*3/uL (ref 4.0–10.5)
nRBC: 0 % (ref 0.0–0.2)

## 2019-04-18 LAB — PROTIME-INR
INR: 1.1 (ref 0.8–1.2)
Prothrombin Time: 13.6 seconds (ref 11.4–15.2)

## 2019-04-18 NOTE — ED Provider Notes (Addendum)
[name] Provider Note   CSN: [id number] Arrival date & time: 04/18/19  2148     History   Chief Complaint Chief Complaint  Patient presents with   Fall    HPI BRIGHTEN BUZZELLI is a 78 y.o. female.     Level 5 caveat for dementia.  Patient from home by EMS.  She apparently had a fall in the living room.  Family heard a loud noise and found her on the floor.  She is complaining of left hip and leg pain and abdominal pain.  Unclear whether she hit her head or loss consciousness.  She is not able to give a history.  She is oriented to person only.  She complains of pain to her left hip and abdomen.  She denies any head or neck pain.  She denies any back pain.  No blood thinners present on medication list.  Discussed with patient's daughter [name] by phone.  Patient fell while the family was in the other room.  Patient was sitting in a chair and try to get up with her walker apparently falling on her left side.  Her confusion appears to be at baseline.  Does not take any blood thinners.  The history is provided by the patient.  Fall    Past Medical History:  Diagnosis Date   Anxiety    Arm fracture, left    Breast cancer (HCC)    Bulging disc    Cardioembolic stroke ([place]) 78/46/9629   Confusion    Depression    Difficulty swallowing solids    GERD (gastroesophageal reflux disease)    Hypertension    Hypothyroidism    Invasive ductal carcinoma of left breast ([place]) 02/24/2014   Neuropathy    Osteoporosis    Rheumatoid arteritis (HCC)    RA   Rotator cuff disorder, right    Scoliosis    Shortness of breath dyspnea    "I have reactive airway disease".    Patient Active Problem List   Diagnosis Date Noted   Cardioembolic stroke ([place]) 52/84/1324   Acute cardioembolic stroke ([place]) 40/06/2724   Acute colitis 08/01/2018   Hypothyroidism 08/01/2018   Hyponatremia 08/01/2018   Hypokalemia 08/01/2018   Infarction of right  basal ganglia ([place]) 08/01/2018   Hypertension 08/01/2018   Dementia ([place]) 08/01/2018   Melena 05/07/2018   Esophageal dysphagia 05/07/2018   Osteoporosis with pathological fracture of forearm 10/03/2016   Lumbar radiculopathy 03/13/2016   Invasive ductal carcinoma of left breast ([place]) 02/24/2014   Neoplasm of uncertain behavior of thyroid gland, 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   Multiple thyroid nodules 06/03/2013   Rheumatoid arthritis ([place]) 07/15/2008    Past Surgical History:  Procedure Laterality Date   ABDOMINAL HYSTERECTOMY     AIKEN OSTEOTOMY Right 10/28/2014   Procedure: Barbie Banner OSTEOTOMY RIGHT FOOT;  Surgeon: Marcheta Grammes, DPM;  Location: [facility];  Service: Podiatry;  Laterality: Right;   BIOPSY  05/15/2018   Procedure: BIOPSY;  Surgeon: Rogene Houston, MD;  Location: [facility];  Service: Endoscopy;;  gastric   BONE BIOPSY  09/29/2015   Procedure: BONE BIOPSY AND BONE CULTURE SECOND TOE RIGHT FOOT;  Surgeon: Caprice Beaver, DPM;  Location: [facility];  Service: Podiatry;;   BREAST SURGERY Left    lumpectomy and axillary lymph node with re-excision 2 weeks later    BUNIONECTOMY Right 10/28/2014   Procedure: SILVER BUNIONECTOMY RIGHT FOOT;  Surgeon: Marcheta Grammes, DPM;  Location: [facility];  Service: Podiatry;  Laterality: Right;   CATARACT EXTRACTION W/PHACO Left 08/10/2017   Procedure: CATARACT EXTRACTION PHACO AND INTRAOCULAR LENS PLACEMENT LEFT EYE;  Surgeon: Baruch Goldmann, MD;  Location: [facility];  Service: Ophthalmology;  Laterality: Left;  CDE: 13.81   CATARACT EXTRACTION W/PHACO Right 09/07/2017   Procedure: CATARACT EXTRACTION PHACO AND INTRAOCULAR LENS PLACEMENT RIGHT EYE;  Surgeon: Baruch Goldmann, MD;  Location: [facility];  Service: Ophthalmology;  Laterality: Right;  right   COLONOSCOPY     DILATION AND CURETTAGE OF UTERUS     ESOPHAGEAL DILATION N/A 05/15/2018   Procedure: ESOPHAGEAL DILATION;  Surgeon: Rogene Houston, MD;  Location[facility];  Service: Endoscopy;  Laterality: N/A;   esophageal stricture     ESOPHAGOGASTRODUODENOSCOPY N/A 05/15/2018   Procedure: ESOPHAGOGASTRODUODENOSCOPY (EGD);  Surgeon: Rogene Houston, MD;  [facility]: [facility];  Service: Endoscopy;  Laterality: N/A;  1:45   HAMMER TOE SURGERY Right 10/28/2014   Procedure: HAMMER TOE CORRECTION 2ND AND 3RD TOE RIGHT FOOT;  Surgeon: Marcheta Grammes, DPM;  Location: [facility];  Service: Podiatry;  Laterality: Right;   LUMBAR LAMINECTOMY/DECOMPRESSION MICRODISCECTOMY Left 03/13/2016   Procedure: Left Lumbar Four-Five, Lumbar Five-Sacral One Laminotomy/Foraminotomy;  Surgeon: Kevan Ny Ditty, MD;  [facility];  Service: Neurosurgery;  Laterality: Left;   REMOVAL OF IMPLANT Right 09/29/2015   Procedure: REMOVAL OF IMPLANT 2ND TOE RIGHT FOOT;  Surgeon: Caprice Beaver, DPM;  Location: [facility];  Service: Podiatry;  Laterality: Right;   TEE WITHOUT CARDIOVERSION N/A 08/05/2018   Procedure: TRANSESOPHAGEAL ECHOCARDIOGRAM (TEE);  Surgeon: Skeet Latch, MD;  Location: [facility] ENDOSCOPY;  Service: Cardiovascular;  Laterality: N/A;     OB History    Gravida  2   Para  2   Term  2   Preterm      AB      Living        SAB      TAB      Ectopic      Multiple      Live Births               Home Medications    Prior to Admission medications   Medication Sig Start Date End Date Taking? Authorizing Provider  albuterol (PROAIR HFA) 108 (90 Base) MCG/ACT inhaler Inhale 2 puffs into the lungs every 6 (six) hours as needed for wheezing or shortness of breath.     [provider]  Calcium Carbonate-Vitamin D (CALTRATE 600+D) 600-400 MG-UNIT tablet Take 1 tablet by mouth 2 (two) times daily.    [provider]  cetirizine (ZYRTEC) 10 MG tablet Take 10 mg by mouth daily as needed for allergies.     [provider]  Cholecalciferol (VITAMIN D) 2000 UNITS CAPS Take 2,000 Units by mouth daily.      [provider]  clopidogrel (PLAVIX) 75 MG tablet Take 1 tablet (75 mg total) by mouth daily. 08/07/18   Shelly Coss, MD  Cranberry 125 MG TABS Take 1 tablet by mouth 3 (three) times daily as needed.    [provider]  denosumab (PROLIA) 60 MG/ML SOLN injection Inject 60 mg into the skin every 6 (six) months. Administer in upper arm, thigh, or abdomen, got in June 2019    [provider]  donepezil (ARICEPT) 10 MG tablet Take 1 tablet (10 mg total) by mouth at bedtime. 10/08/18   Garvin Fila, MD  DULoxetine (CYMBALTA) 60 MG capsule Take 60 mg by mouth daily.  [provider]  folic acid (FOLVITE) 660 MCG tablet Take 800 mcg by mouth daily.    [provider]  HYDROcodone-acetaminophen (NORCO/VICODIN) 5-325 MG tablet Take 1 tablet by mouth daily as needed for pain. 02/04/18   [provider]  lisinopril (PRINIVIL,ZESTRIL) 10 MG tablet Take 5 mg by mouth daily. Son says she takes it as needed when she feels her BP is high, she will check it and then take it if needed.    [provider]  Magnesium 500 MG CAPS Take 500 mg by mouth daily.     [provider]  memantine (NAMENDA) 10 MG tablet Take 10 mg by mouth 2 (two) times daily.    [provider]  methotrexate (RHEUMATREX) 2.5 MG tablet Take 22.5 mg by mouth once a week. Every Monday 08/31/15   [provider]  omeprazole (PRILOSEC) 20 MG capsule Take 20 mg by mouth 2 (two) times daily before a meal.    [provider]  OVER THE COUNTER MEDICATION Take 8 oz by mouth daily as needed (leg pain). Tonic Water with Quinine    [provider]  pantoprazole (PROTONIX) 40 MG tablet Take 1 tablet (40 mg total) by mouth daily. Patient not taking: Reported on 10/02/2018 05/02/18   Butch Penny, NP  polyethylene glycol (MIRALAX / GLYCOLAX) packet Take 17 g by mouth daily. 08/07/18   Shelly Coss, MD  potassium chloride (K-DUR) 10 MEQ tablet Take  10 mEq by mouth at bedtime.    [provider]  senna (EQ NATURAL VEGETABLE LAXATIVE) 8.6 MG tablet Take 1 tablet by mouth daily as needed for constipation.    [provider]  Zinc 50 MG CAPS Take 50 mg by mouth daily.     [provider]    Family History Family History  Problem Relation Age of Onset   Hypertension Mother    Cancer Maternal Grandmother    Cancer Cousin     Social History Social History   Tobacco Use   Smoking status: Never Smoker   Smokeless tobacco: Never Used  Substance Use Topics   Alcohol use: No   Drug use: No     Allergies   Celecoxib, Ibuprofen, Diphenhydramine, and Wygesic [propoxyphene n-acetaminophen]   Review of Systems Review of Systems  Unable to perform ROS: Dementia     Physical Exam Updated Vital Signs BP 139/68    Pulse 96    Temp 98.3 F (36.8 C) (Oral)    Resp 20    SpO2 95%   Physical Exam Vitals signs and nursing note reviewed.  Constitutional:      General: She is not in acute distress.    Appearance: She is well-developed.  HENT:     Head: Normocephalic and atraumatic.     Mouth/Throat:     Pharynx: No oropharyngeal exudate.  Eyes:     Conjunctiva/sclera: Conjunctivae normal.     Pupils: Pupils are equal, round, and reactive to light.  Neck:     Musculoskeletal: Normal range of motion and neck supple.     Comments: No C-spine tenderness Cardiovascular:     Rate and Rhythm: Normal rate and regular rhythm.     Heart sounds: Normal heart sounds. No murmur.  Pulmonary:     Effort: Pulmonary effort is normal. No respiratory distress.     Breath sounds: Normal breath sounds.  Abdominal:     Palpations: Abdomen is soft.     Tenderness: There is abdominal tenderness.  There is no guarding or rebound.     Comments: Left-sided lower abdominal tenderness, worse with palpation and movement.  Musculoskeletal: Normal range of motion.        General: Tenderness and deformity present.      Comments: Left leg is shortened and externally rotated.  Intact DP and PT pulse.  Able to range right hip without pain  Skin:    General: Skin is warm.  Neurological:     Mental Status: She is alert.     Cranial Nerves: No cranial nerve deficit.     Motor: No abnormal muscle tone.     Coordination: Coordination normal.     Comments: Oriented to person only, moves all extremities to command.  Psychiatric:        Behavior: Behavior normal.      ED Treatments / Results  Labs (all labs ordered are listed, but only abnormal results are displayed) Labs Reviewed  BASIC METABOLIC PANEL - Abnormal; Notable for the following components:      Result Value   Glucose, Bld 130 (*)    All other components within normal limits  CBC WITH DIFFERENTIAL/PLATELET - Abnormal; Notable for the following components:   RDW 16.9 (*)    Platelets 138 (*)    Neutro Abs 8.1 (*)    Lymphs Abs 0.5 (*)    All other components within normal limits  URINALYSIS, ROUTINE W REFLEX MICROSCOPIC - Abnormal; Notable for the following components:   APPearance CLOUDY (*)    All other components within normal limits  COMPREHENSIVE METABOLIC PANEL - Abnormal; Notable for the following components:   Glucose, Bld 135 (*)    Total Protein 6.1 (*)    Albumin 3.1 (*)    All other components within normal limits  CBC - Abnormal; Notable for the following components:   RBC 3.79 (*)    Hemoglobin 11.8 (*)    RDW 16.8 (*)    Platelets 120 (*)    All other components within normal limits  SARS CORONAVIRUS 2 (HOSPITAL ORDER, [place] [facility])  URINE CULTURE  PROTIME-INR  TROPONIN I (HIGH SENSITIVITY)  TROPONIN I (HIGH SENSITIVITY)    EKG EKG Interpretation  Date/Time:  Friday April 18 2019 23:47:51 EDT Ventricular Rate:  97 PR Interval:    QRS Duration: 92 QT Interval:  328 QTC Calculation: 417 R Axis:   40 Text Interpretation:  Sinus rhythm Borderline repolarization abnormality Baseline  wander in lead(s) II aVF Nonspecific T wave abnormality Confirmed by Ezequiel Essex [phone]) on 04/18/2019 11:52:30 PM   Radiology Dg Chest 1 View  Result Date: 04/19/2019 CLINICAL DATA:  Preop left hip fracture EXAM: CHEST  1 VIEW COMPARISON:  None. FINDINGS: Mildly hyperexpanded lungs. Normal cardiomediastinal contours. No focal airspace disease. IMPRESSION: No active disease. Electronically Signed   By: Ulyses Jarred M.D.   On: 04/19/2019 00:20   Ct Head Wo Contrast  Result Date: 04/18/2019 CLINICAL DATA:  [age]-year-old female with admit missed fall. Altered mental status. History of dementia. EXAM: CT HEAD WITHOUT CONTRAST CT CERVICAL SPINE WITHOUT CONTRAST TECHNIQUE: Multidetector CT imaging of the head and cervical spine was performed following the standard protocol without intravenous contrast. Multiplanar CT image reconstructions of the cervical spine were also generated. COMPARISON:  Head CT dated 08/01/2018 FINDINGS: CT HEAD FINDINGS Brain: There is moderate age-related atrophy and chronic microvascular ischemic changes. Bilateral temporal hypodensities likely old infarcts. There is no acute intracranial hemorrhage. No mass effect or midline shift.  No extra-axial fluid collection. Vascular: No hyperdense vessel or unexpected calcification. Skull: Normal. Negative for fracture or focal lesion. Sinuses/Orbits: No acute finding. Other: None CT CERVICAL SPINE FINDINGS Alignment: No acute subluxation. Mild reversal of normal cervical lordosis, likely secondary to degenerative changes. Skull base and vertebrae: No acute fracture. Osteopenia. Soft tissues and spinal canal: No prevertebral fluid or swelling. No visible canal hematoma. Disc levels: Extensive multilevel degenerative changes with endplate irregularity and disc space narrowing. Multilevel facet arthropathy. Upper chest: Negative. Other: None IMPRESSION: 1. No acute intracranial hemorrhage. 2. Age-related atrophy and chronic microvascular  ischemic changes. Old bilateral temporal infarcts. 3. No acute/traumatic cervical spine pathology. Extensive degenerative changes. Electronically Signed   By: Anner Crete M.D.   On: 04/18/2019 23:24   Ct Cervical Spine Wo Contrast  Result Date: 04/18/2019 CLINICAL DATA:  [age]-year-old female with admit missed fall. Altered mental status. History of dementia. EXAM: CT HEAD WITHOUT CONTRAST CT CERVICAL SPINE WITHOUT CONTRAST TECHNIQUE: Multidetector CT imaging of the head and cervical spine was performed following the standard protocol without intravenous contrast. Multiplanar CT image reconstructions of the cervical spine were also generated. COMPARISON:  Head CT dated 08/01/2018 FINDINGS: CT HEAD FINDINGS Brain: There is moderate age-related atrophy and chronic microvascular ischemic changes. Bilateral temporal hypodensities likely old infarcts. There is no acute intracranial hemorrhage. No mass effect or midline shift. No extra-axial fluid collection. Vascular: No hyperdense vessel or unexpected calcification. Skull: Normal. Negative for fracture or focal lesion. Sinuses/Orbits: No acute finding. Other: None CT CERVICAL SPINE FINDINGS Alignment: No acute subluxation. Mild reversal of normal cervical lordosis, likely secondary to degenerative changes. Skull base and vertebrae: No acute fracture. Osteopenia. Soft tissues and spinal canal: No prevertebral fluid or swelling. No visible canal hematoma. Disc levels: Extensive multilevel degenerative changes with endplate irregularity and disc space narrowing. Multilevel facet arthropathy. Upper chest: Negative. Other: None IMPRESSION: 1. No acute intracranial hemorrhage. 2. Age-related atrophy and chronic microvascular ischemic changes. Old bilateral temporal infarcts. 3. No acute/traumatic cervical spine pathology. Extensive degenerative changes. Electronically Signed   By: Anner Crete M.D.   On: 04/18/2019 23:24   Ct Abdomen Pelvis W  Contrast  Result Date: 04/19/2019 CLINICAL DATA:  78 year old female with and abdominal trauma. Left hip fracture. EXAM: CT ABDOMEN AND PELVIS WITH CONTRAST TECHNIQUE: Multidetector CT imaging of the abdomen and pelvis was performed using the standard protocol following bolus administration of intravenous contrast. CONTRAST:  110mL OMNIPAQUE IOHEXOL 300 MG/ML  SOLN COMPARISON:  CT of the abdomen pelvis dated 08/29/2018 FINDINGS: Lower chest: Bibasilar dependent atelectatic changes. There is compressive atelectasis of the left lower lobe secondary to a large hiatal hernia. There is mild cardiomegaly. Calcification of the mitral annulus noted. No intra-abdominal free air or free fluid. Hepatobiliary: The liver is unremarkable. No intrahepatic biliary ductal dilatation. No calcified gallstone or pericholecystic fluid. Pancreas: Unremarkable. No pancreatic ductal dilatation or surrounding inflammatory changes. Spleen: Normal in size without focal abnormality. Adrenals/Urinary Tract: The adrenal glands are unremarkable as visualized. There is a 3 cm left renal upper pole cyst and additional smaller bilateral peripelvic cyst. There is no hydronephrosis on either side. There is symmetric enhancement and excretion of contrast by both kidneys. The visualized ureters and urinary bladder appear unremarkable. Stomach/Bowel: Scattered sigmoid diverticula without active inflammatory changes. There is a large hiatal hernia. Large amount of stool noted throughout the colon. There is no bowel obstruction or active inflammation. The appendix is normal. Vascular/Lymphatic: Mild aortoiliac atherosclerotic disease. The IVC is unremarkable. No  portal venous gas. There is no adenopathy. Reproductive: Hysterectomy. No pelvic mass. Other: None Musculoskeletal: Osteopenia with scoliosis and degenerative changes of the spine. There is a displaced fracture of the left femoral neck with mild proximal migration of the femoral shaft. No  dislocation. No definite other acute osseous pathology identified. IMPRESSION: 1. Displaced left femoral neck fracture. 2. Large hiatal hernia. 3. Colonic diverticulosis. No bowel obstruction or active inflammation. Normal appendix. Aortic Atherosclerosis (ICD10-I70.0). Electronically Signed   By: Anner Crete M.D.   On: 04/19/2019 03:16   Dg Hip Unilat With Pelvis 2-3 Views Left  Result Date: 04/19/2019 CLINICAL DATA:  Fall at home today with left hip pain. EXAM: DG HIP (WITH OR WITHOUT PELVIS) 2-3V LEFT COMPARISON:  None. FINDINGS: Displaced left femoral neck fracture. Proximal migration of the femoral shaft. Femoral head remains seated. Pubic rami and remainder of the bony pelvis is intact. Pubic symphysis and sacroiliac joints are congruent. IMPRESSION: Displaced left femoral neck fracture. Electronically Signed   By: Keith Rake M.D.   On: 04/19/2019 00:10    Procedures Procedures (including critical care time)  Medications Ordered in ED Medications - No data to display   Initial Impression / Assessment and Plan / ED Course  I have reviewed the triage vital signs and the nursing notes.  Pertinent labs & imaging results that were available during my care of the patient were reviewed by me and considered in my medical decision making (see chart for details).       Fall with suspected hip fracture.  Dementia and confusion at baseline.  CT head and C-spine ordered in triage are negative.  X-ray shows displaced left femoral neck fracture. Neurovascular intact distally.  Discussed with Dr. Marcelino Scot orthopedics who will evaluate and recommends transfer to [name]. Screening labs are unremarkable.  Abdominal CT does not show any additional traumatic injury.  Patient daughter updated.  Admission discussed with Dr. Maudie Mercury who will arrange for [name].   Final Clinical Impressions(s) / ED Diagnoses   Final diagnoses:  Closed left hip fracture, initial encounter [facility])   Fall, initial encounter    ED Discharge Orders    None       Kamali Nephew, Annie Main, MD 04/19/19 [id number]    Ezequiel Essex, MD 04/19/19 1356

## 2019-04-18 NOTE — ED Triage Notes (Signed)
Pt lives with family and has dementia. Pt was watching tv and family stepped outside of livingroom. Pt family heard loud noise and found patient in floor. Pt complaining of left leg/hip pain.

## 2019-04-18 NOTE — ED Triage Notes (Signed)
Please call daughter [name] at  [phone] or [phone]

## 2019-04-18 NOTE — ED Notes (Signed)
Pt daughter [name] called to check on pt status. Unknown if patient family will visit

## 2019-04-19 ENCOUNTER — Emergency Department (HOSPITAL_COMMUNITY): Payer: PPO

## 2019-04-19 ENCOUNTER — Encounter (HOSPITAL_COMMUNITY): Payer: Self-pay | Admitting: Internal Medicine

## 2019-04-19 ENCOUNTER — Inpatient Hospital Stay: Admit: 2019-04-19 | Payer: PPO | Admitting: Orthopedic Surgery

## 2019-04-19 ENCOUNTER — Inpatient Hospital Stay (HOSPITAL_COMMUNITY): Payer: PPO | Admitting: Anesthesiology

## 2019-04-19 ENCOUNTER — Inpatient Hospital Stay (HOSPITAL_COMMUNITY): Payer: PPO

## 2019-04-19 ENCOUNTER — Encounter (HOSPITAL_COMMUNITY)
Admission: EM | Disposition: A | Discharge status: Home Health | Payer: Self-pay | Source: Home / Self Care | Attending: Internal Medicine

## 2019-04-19 DIAGNOSIS — M25552 Pain in left hip: Secondary | ICD-10-CM | POA: Diagnosis present

## 2019-04-19 DIAGNOSIS — Z79891 Long term (current) use of opiate analgesic: Secondary | ICD-10-CM | POA: Diagnosis not present

## 2019-04-19 DIAGNOSIS — D72829 Elevated white blood cell count, unspecified: Secondary | ICD-10-CM | POA: Diagnosis not present

## 2019-04-19 DIAGNOSIS — I10 Essential (primary) hypertension: Secondary | ICD-10-CM

## 2019-04-19 DIAGNOSIS — E039 Hypothyroidism, unspecified: Secondary | ICD-10-CM | POA: Diagnosis present

## 2019-04-19 DIAGNOSIS — D509 Iron deficiency anemia, unspecified: Secondary | ICD-10-CM | POA: Diagnosis not present

## 2019-04-19 DIAGNOSIS — F418 Other specified anxiety disorders: Secondary | ICD-10-CM | POA: Diagnosis not present

## 2019-04-19 DIAGNOSIS — Z8673 Personal history of transient ischemic attack (TIA), and cerebral infarction without residual deficits: Secondary | ICD-10-CM | POA: Diagnosis not present

## 2019-04-19 DIAGNOSIS — S72052D Unspecified fracture of head of left femur, subsequent encounter for closed fracture with routine healing: Secondary | ICD-10-CM | POA: Diagnosis not present

## 2019-04-19 DIAGNOSIS — Z7902 Long term (current) use of antithrombotics/antiplatelets: Secondary | ICD-10-CM | POA: Diagnosis not present

## 2019-04-19 DIAGNOSIS — G629 Polyneuropathy, unspecified: Secondary | ICD-10-CM | POA: Diagnosis present

## 2019-04-19 DIAGNOSIS — S72002A Fracture of unspecified part of neck of left femur, initial encounter for closed fracture: Secondary | ICD-10-CM | POA: Diagnosis present

## 2019-04-19 DIAGNOSIS — M069 Rheumatoid arthritis, unspecified: Secondary | ICD-10-CM | POA: Diagnosis present

## 2019-04-19 DIAGNOSIS — Z79899 Other long term (current) drug therapy: Secondary | ICD-10-CM | POA: Diagnosis not present

## 2019-04-19 DIAGNOSIS — E8889 Other specified metabolic disorders: Secondary | ICD-10-CM | POA: Diagnosis present

## 2019-04-19 DIAGNOSIS — R739 Hyperglycemia, unspecified: Secondary | ICD-10-CM | POA: Diagnosis not present

## 2019-04-19 DIAGNOSIS — N289 Disorder of kidney and ureter, unspecified: Secondary | ICD-10-CM | POA: Diagnosis not present

## 2019-04-19 DIAGNOSIS — F0390 Unspecified dementia without behavioral disturbance: Secondary | ICD-10-CM

## 2019-04-19 DIAGNOSIS — F329 Major depressive disorder, single episode, unspecified: Secondary | ICD-10-CM | POA: Diagnosis present

## 2019-04-19 DIAGNOSIS — M810 Age-related osteoporosis without current pathological fracture: Secondary | ICD-10-CM | POA: Diagnosis not present

## 2019-04-19 DIAGNOSIS — D62 Acute posthemorrhagic anemia: Secondary | ICD-10-CM | POA: Diagnosis present

## 2019-04-19 DIAGNOSIS — F419 Anxiety disorder, unspecified: Secondary | ICD-10-CM | POA: Diagnosis not present

## 2019-04-19 DIAGNOSIS — Z90710 Acquired absence of both cervix and uterus: Secondary | ICD-10-CM | POA: Diagnosis not present

## 2019-04-19 DIAGNOSIS — D696 Thrombocytopenia, unspecified: Secondary | ICD-10-CM | POA: Diagnosis not present

## 2019-04-19 DIAGNOSIS — Z20828 Contact with and (suspected) exposure to other viral communicable diseases: Secondary | ICD-10-CM | POA: Diagnosis not present

## 2019-04-19 DIAGNOSIS — E538 Deficiency of other specified B group vitamins: Secondary | ICD-10-CM | POA: Diagnosis not present

## 2019-04-19 DIAGNOSIS — S3991XA Unspecified injury of abdomen, initial encounter: Secondary | ICD-10-CM | POA: Diagnosis not present

## 2019-04-19 DIAGNOSIS — K219 Gastro-esophageal reflux disease without esophagitis: Secondary | ICD-10-CM | POA: Diagnosis not present

## 2019-04-19 DIAGNOSIS — N179 Acute kidney failure, unspecified: Secondary | ICD-10-CM | POA: Diagnosis not present

## 2019-04-19 DIAGNOSIS — Z853 Personal history of malignant neoplasm of breast: Secondary | ICD-10-CM | POA: Diagnosis not present

## 2019-04-19 DIAGNOSIS — W1830XA Fall on same level, unspecified, initial encounter: Secondary | ICD-10-CM | POA: Diagnosis present

## 2019-04-19 DIAGNOSIS — S72009A Fracture of unspecified part of neck of unspecified femur, initial encounter for closed fracture: Secondary | ICD-10-CM | POA: Diagnosis present

## 2019-04-19 DIAGNOSIS — Y92009 Unspecified place in unspecified non-institutional (private) residence as the place of occurrence of the external cause: Secondary | ICD-10-CM | POA: Diagnosis not present

## 2019-04-19 HISTORY — PX: HIP ARTHROPLASTY: SHX981

## 2019-04-19 LAB — URINALYSIS, ROUTINE W REFLEX MICROSCOPIC
Bilirubin Urine: NEGATIVE
Glucose, UA: NEGATIVE mg/dL
Hgb urine dipstick: NEGATIVE
Ketones, ur: NEGATIVE mg/dL
Leukocytes,Ua: NEGATIVE
Nitrite: NEGATIVE
Protein, ur: NEGATIVE mg/dL
Specific Gravity, Urine: 1.012 (ref 1.005–1.030)
pH: 8 (ref 5.0–8.0)

## 2019-04-19 LAB — COMPREHENSIVE METABOLIC PANEL
ALT: 21 U/L (ref 0–44)
AST: 33 U/L (ref 15–41)
Albumin: 3.1 g/dL — ABNORMAL LOW (ref 3.5–5.0)
Alkaline Phosphatase: 113 U/L (ref 38–126)
Anion gap: 5 (ref 5–15)
BUN: 14 mg/dL (ref 8–23)
CO2: 27 mmol/L (ref 22–32)
Calcium: 9.2 mg/dL (ref 8.9–10.3)
Chloride: 105 mmol/L (ref 98–111)
Creatinine, Ser: 0.76 mg/dL (ref 0.44–1.00)
GFR calc Af Amer: >60 mL/min (ref >60)
GFR calc non Af Amer: >60 mL/min (ref >60)
Glucose, Bld: 135 mg/dL — ABNORMAL HIGH (ref 70–99)
Potassium: 3.8 mmol/L (ref 3.5–5.1)
Sodium: 137 mmol/L (ref 135–145)
Total Bilirubin: 0.8 mg/dL (ref 0.3–1.2)
Total Protein: 6.1 g/dL — ABNORMAL LOW (ref 6.5–8.1)

## 2019-04-19 LAB — BASIC METABOLIC PANEL
Anion gap: 8 (ref 5–15)
BUN: 14 mg/dL (ref 8–23)
CO2: 26 mmol/L (ref 22–32)
Calcium: 9.7 mg/dL (ref 8.9–10.3)
Chloride: 105 mmol/L (ref 98–111)
Creatinine, Ser: 0.87 mg/dL (ref 0.44–1.00)
GFR calc Af Amer: >60 mL/min (ref >60)
GFR calc non Af Amer: >60 mL/min (ref >60)
Glucose, Bld: 130 mg/dL — ABNORMAL HIGH (ref 70–99)
Potassium: 3.8 mmol/L (ref 3.5–5.1)
Sodium: 139 mmol/L (ref 135–145)

## 2019-04-19 LAB — CBC
HCT: 36.9 % (ref 36.0–46.0)
Hemoglobin: 11.8 g/dL — ABNORMAL LOW (ref 12.0–15.0)
MCH: 31.1 pg (ref 26.0–34.0)
MCHC: 32 g/dL (ref 30.0–36.0)
MCV: 97.4 fL (ref 80.0–100.0)
Platelets: 120 10*3/uL — ABNORMAL LOW (ref 150–400)
RBC: 3.79 MIL/uL — ABNORMAL LOW (ref 3.87–5.11)
RDW: 16.8 % — ABNORMAL HIGH (ref 11.5–15.5)
WBC: 7.8 10*3/uL (ref 4.0–10.5)
nRBC: 0 % (ref 0.0–0.2)

## 2019-04-19 LAB — ABO/RH: ABO/RH(D): O POS

## 2019-04-19 LAB — TROPONIN I (HIGH SENSITIVITY)
Troponin I (High Sensitivity): 5 ng/L (ref <18)
Troponin I (High Sensitivity): 5 ng/L (ref <18)

## 2019-04-19 LAB — TYPE AND SCREEN
ABO/RH(D): O POS
Antibody Screen: NEGATIVE

## 2019-04-19 LAB — SARS CORONAVIRUS 2 BY RT PCR (HOSPITAL ORDER, PERFORMED IN ~~LOC~~ HOSPITAL LAB): SARS Coronavirus 2: NEGATIVE

## 2019-04-19 SURGERY — HEMIARTHROPLASTY, HIP, DIRECT ANTERIOR APPROACH, FOR FRACTURE
Anesthesia: General | Site: Hip | Laterality: Left

## 2019-04-19 MED ORDER — POTASSIUM CHLORIDE CRYS ER 10 MEQ PO TBCR
10.0000 meq | EXTENDED_RELEASE_TABLET | Freq: Every day | ORAL | Status: DC
  Administered 2019-04-19 – 2019-04-22 (×4): 10 meq via ORAL
  Filled 2019-04-19 (×4): qty 1

## 2019-04-19 MED ORDER — CLOPIDOGREL BISULFATE 75 MG PO TABS
75.0000 mg | ORAL_TABLET | Freq: Every day | ORAL | Status: DC
  Administered 2019-04-21 – 2019-04-23 (×3): 75 mg via ORAL
  Filled 2019-04-19 (×3): qty 1

## 2019-04-19 MED ORDER — PROPOFOL 10 MG/ML IV BOLUS
INTRAVENOUS | Status: DC | PRN
  Administered 2019-04-19: 50 mg via INTRAVENOUS

## 2019-04-19 MED ORDER — ACETAMINOPHEN 650 MG RE SUPP: 650.0000 mg | Freq: Four times a day (QID) | RECTAL | Status: DC | PRN

## 2019-04-19 MED ORDER — HYDROCODONE-ACETAMINOPHEN 5-325 MG PO TABS: 1.0000 | ORAL_TABLET | ORAL | Status: DC | PRN

## 2019-04-19 MED ORDER — LORATADINE 10 MG PO TABS
10.0000 mg | ORAL_TABLET | Freq: Every day | ORAL | Status: DC
  Administered 2019-04-20 – 2019-04-21 (×2): 10 mg via ORAL
  Filled 2019-04-19 (×4): qty 1

## 2019-04-19 MED ORDER — ONDANSETRON HCL 4 MG PO TABS: 4.0000 mg | ORAL_TABLET | Freq: Four times a day (QID) | ORAL | Status: DC | PRN

## 2019-04-19 MED ORDER — MEMANTINE HCL 10 MG PO TABS
5.0000 mg | ORAL_TABLET | Freq: Two times a day (BID) | ORAL | Status: DC
  Administered 2019-04-19 – 2019-04-23 (×8): 5 mg via ORAL
  Filled 2019-04-19 (×8): qty 1

## 2019-04-19 MED ORDER — ENOXAPARIN SODIUM 40 MG/0.4ML ~~LOC~~ SOLN
40.0000 mg | SUBCUTANEOUS | Status: DC
  Administered 2019-04-20 – 2019-04-22 (×3): 40 mg via SUBCUTANEOUS
  Filled 2019-04-19 (×4): qty 0.4

## 2019-04-19 MED ORDER — SUCCINYLCHOLINE CHLORIDE 200 MG/10ML IV SOSY
PREFILLED_SYRINGE | INTRAVENOUS | Status: AC
  Filled 2019-04-19: qty 10

## 2019-04-19 MED ORDER — PANTOPRAZOLE SODIUM 40 MG PO TBEC
40.0000 mg | DELAYED_RELEASE_TABLET | Freq: Every day | ORAL | Status: DC
  Administered 2019-04-20 – 2019-04-21 (×2): 40 mg via ORAL
  Filled 2019-04-19 (×4): qty 1

## 2019-04-19 MED ORDER — DOCUSATE SODIUM 100 MG PO CAPS
100.0000 mg | ORAL_CAPSULE | Freq: Two times a day (BID) | ORAL | Status: DC
  Administered 2019-04-19 – 2019-04-22 (×6): 100 mg via ORAL
  Filled 2019-04-19 (×8): qty 1

## 2019-04-19 MED ORDER — MENTHOL 3 MG MT LOZG: 1.0000 | LOZENGE | OROMUCOSAL | Status: DC | PRN

## 2019-04-19 MED ORDER — ROCURONIUM BROMIDE 10 MG/ML (PF) SYRINGE
PREFILLED_SYRINGE | INTRAVENOUS | Status: AC
  Filled 2019-04-19: qty 10

## 2019-04-19 MED ORDER — LIDOCAINE 2% (20 MG/ML) 5 ML SYRINGE
INTRAMUSCULAR | Status: AC
  Filled 2019-04-19: qty 5

## 2019-04-19 MED ORDER — ONDANSETRON HCL 4 MG/2ML IJ SOLN
4.0000 mg | Freq: Four times a day (QID) | INTRAMUSCULAR | Status: DC | PRN
  Administered 2019-04-19 (×2): 4 mg via INTRAVENOUS
  Filled 2019-04-19 (×2): qty 2

## 2019-04-19 MED ORDER — CEFAZOLIN SODIUM-DEXTROSE 2-3 GM-%(50ML) IV SOLR
INTRAVENOUS | Status: DC | PRN
  Administered 2019-04-19: 2 g via INTRAVENOUS

## 2019-04-19 MED ORDER — DULOXETINE HCL 60 MG PO CPEP
60.0000 mg | ORAL_CAPSULE | Freq: Every day | ORAL | Status: DC
  Administered 2019-04-20 – 2019-04-23 (×4): 60 mg via ORAL
  Filled 2019-04-19 (×4): qty 1

## 2019-04-19 MED ORDER — DEXAMETHASONE SODIUM PHOSPHATE 10 MG/ML IJ SOLN
INTRAMUSCULAR | Status: DC | PRN
  Administered 2019-04-19: 5 mg via INTRAVENOUS

## 2019-04-19 MED ORDER — SUCCINYLCHOLINE CHLORIDE 20 MG/ML IJ SOLN
INTRAMUSCULAR | Status: DC | PRN
  Administered 2019-04-19: 80 mg via INTRAVENOUS

## 2019-04-19 MED ORDER — ALBUTEROL SULFATE (2.5 MG/3ML) 0.083% IN NEBU: 2.5000 mg | INHALATION_SOLUTION | Freq: Four times a day (QID) | RESPIRATORY_TRACT | Status: DC | PRN

## 2019-04-19 MED ORDER — HYDROCODONE-ACETAMINOPHEN 7.5-325 MG PO TABS
1.0000 | ORAL_TABLET | ORAL | Status: DC | PRN
  Administered 2019-04-20 – 2019-04-22 (×3): 1 via ORAL
  Filled 2019-04-19 (×3): qty 1

## 2019-04-19 MED ORDER — SENNA 8.6 MG PO TABS: 8.6000 mg | ORAL_TABLET | Freq: Every day | ORAL | Status: DC | PRN

## 2019-04-19 MED ORDER — MORPHINE SULFATE (PF) 2 MG/ML IV SOLN: 0.5000 mg | INTRAVENOUS | Status: DC | PRN

## 2019-04-19 MED ORDER — ENOXAPARIN SODIUM 40 MG/0.4ML ~~LOC~~ SOLN
40.0000 mg | SUBCUTANEOUS | Status: DC
  Administered 2019-04-19: 40 mg via SUBCUTANEOUS
  Filled 2019-04-19: qty 0.4

## 2019-04-19 MED ORDER — MAGNESIUM 500 MG PO CAPS: 500.0000 mg | ORAL_CAPSULE | Freq: Every day | ORAL | Status: DC

## 2019-04-19 MED ORDER — 0.9 % SODIUM CHLORIDE (POUR BTL) OPTIME
TOPICAL | Status: DC | PRN
  Administered 2019-04-19: 1000 mL

## 2019-04-19 MED ORDER — HYDROMORPHONE HCL 1 MG/ML IJ SOLN
0.5000 mg | INTRAMUSCULAR | Status: DC | PRN
  Administered 2019-04-19 (×2): 0.5 mg via INTRAVENOUS
  Filled 2019-04-19 (×2): qty 1

## 2019-04-19 MED ORDER — CEFAZOLIN SODIUM-DEXTROSE 2-4 GM/100ML-% IV SOLN
INTRAVENOUS | Status: AC
  Filled 2019-04-19: qty 100

## 2019-04-19 MED ORDER — PROPOFOL 10 MG/ML IV BOLUS
INTRAVENOUS | Status: AC
  Filled 2019-04-19: qty 20

## 2019-04-19 MED ORDER — DONEPEZIL HCL 10 MG PO TABS
10.0000 mg | ORAL_TABLET | Freq: Every day | ORAL | Status: DC
  Administered 2019-04-20 – 2019-04-22 (×3): 10 mg via ORAL
  Filled 2019-04-19 (×3): qty 1

## 2019-04-19 MED ORDER — ACETAMINOPHEN 325 MG PO TABS: 325.0000 mg | ORAL_TABLET | Freq: Four times a day (QID) | ORAL | Status: DC | PRN

## 2019-04-19 MED ORDER — STERILE WATER FOR IRRIGATION IR SOLN
Status: DC | PRN
  Administered 2019-04-19: 1000 mL

## 2019-04-19 MED ORDER — ENOXAPARIN SODIUM 40 MG/0.4ML ~~LOC~~ SOLN: 40.0000 mg | SUBCUTANEOUS | Status: DC

## 2019-04-19 MED ORDER — FENTANYL CITRATE (PF) 250 MCG/5ML IJ SOLN
INTRAMUSCULAR | Status: AC
  Filled 2019-04-19: qty 5

## 2019-04-19 MED ORDER — SUGAMMADEX SODIUM 200 MG/2ML IV SOLN
INTRAVENOUS | Status: DC | PRN
  Administered 2019-04-19: 150 mg via INTRAVENOUS

## 2019-04-19 MED ORDER — FENTANYL CITRATE (PF) 100 MCG/2ML IJ SOLN
INTRAMUSCULAR | Status: DC | PRN
  Administered 2019-04-19 (×2): 25 ug via INTRAVENOUS
  Administered 2019-04-19: 50 ug via INTRAVENOUS

## 2019-04-19 MED ORDER — LACTATED RINGERS IV SOLN
INTRAVENOUS | Status: DC | PRN
  Administered 2019-04-19 (×2): via INTRAVENOUS

## 2019-04-19 MED ORDER — SODIUM CHLORIDE 0.9 % IV SOLN
INTRAVENOUS | Status: DC | PRN
  Administered 2019-04-19: 17:00:00 50 ug/min via INTRAVENOUS

## 2019-04-19 MED ORDER — ACETAMINOPHEN 325 MG PO TABS
650.0000 mg | ORAL_TABLET | Freq: Four times a day (QID) | ORAL | Status: DC | PRN
  Administered 2019-04-20: 650 mg via ORAL
  Filled 2019-04-19: qty 2

## 2019-04-19 MED ORDER — CEFAZOLIN SODIUM-DEXTROSE 2-4 GM/100ML-% IV SOLN
2.0000 g | Freq: Four times a day (QID) | INTRAVENOUS | Status: AC
  Administered 2019-04-19 – 2019-04-20 (×2): 2 g via INTRAVENOUS
  Filled 2019-04-19 (×2): qty 100

## 2019-04-19 MED ORDER — POLYETHYLENE GLYCOL 3350 17 G PO PACK
17.0000 g | PACK | Freq: Every day | ORAL | Status: DC
  Administered 2019-04-20 – 2019-04-22 (×2): 17 g via ORAL
  Filled 2019-04-19 (×4): qty 1

## 2019-04-19 MED ORDER — HYDRALAZINE HCL 20 MG/ML IJ SOLN: 10.0000 mg | Freq: Four times a day (QID) | INTRAMUSCULAR | Status: DC | PRN

## 2019-04-19 MED ORDER — CLOPIDOGREL BISULFATE 75 MG PO TABS: 75.0000 mg | ORAL_TABLET | Freq: Every day | ORAL | Status: DC

## 2019-04-19 MED ORDER — FOLIC ACID 800 MCG PO TABS: 800.0000 ug | ORAL_TABLET | Freq: Every day | ORAL | Status: DC

## 2019-04-19 MED ORDER — DEXAMETHASONE SODIUM PHOSPHATE 10 MG/ML IJ SOLN
INTRAMUSCULAR | Status: AC
  Filled 2019-04-19: qty 1

## 2019-04-19 MED ORDER — PHENOL 1.4 % MT LIQD: 1.0000 | OROMUCOSAL | Status: DC | PRN

## 2019-04-19 MED ORDER — ONDANSETRON HCL 4 MG/2ML IJ SOLN: 4.0000 mg | Freq: Four times a day (QID) | INTRAMUSCULAR | Status: DC | PRN

## 2019-04-19 MED ORDER — SODIUM CHLORIDE 0.9 % IV SOLN
INTRAVENOUS | Status: AC
  Administered 2019-04-19: 04:00:00 via INTRAVENOUS

## 2019-04-19 MED ORDER — ROCURONIUM BROMIDE 50 MG/5ML IV SOSY
PREFILLED_SYRINGE | INTRAVENOUS | Status: DC | PRN
  Administered 2019-04-19: 30 mg via INTRAVENOUS
  Administered 2019-04-19 (×2): 5 mg via INTRAVENOUS

## 2019-04-19 MED ORDER — IOHEXOL 300 MG/ML  SOLN
100.0000 mL | Freq: Once | INTRAMUSCULAR | Status: AC | PRN
  Administered 2019-04-19: 100 mL via INTRAVENOUS

## 2019-04-19 MED ORDER — ACETAMINOPHEN 500 MG PO TABS
500.0000 mg | ORAL_TABLET | Freq: Three times a day (TID) | ORAL | Status: AC
  Administered 2019-04-19 – 2019-04-20 (×3): 500 mg via ORAL
  Filled 2019-04-19 (×3): qty 1

## 2019-04-19 MED ORDER — LIDOCAINE 2% (20 MG/ML) 5 ML SYRINGE
INTRAMUSCULAR | Status: DC | PRN
  Administered 2019-04-19: 60 mg via INTRAVENOUS

## 2019-04-19 MED ORDER — ONDANSETRON HCL 4 MG/2ML IJ SOLN
INTRAMUSCULAR | Status: AC
  Filled 2019-04-19: qty 2

## 2019-04-19 SURGICAL SUPPLY — 57 items
BLADE SAW SAG 73X25 THK (BLADE) ×1
BLADE SAW SGTL 73X25 THK (BLADE) ×2 IMPLANT
BLADE SAW SGTL HD 18.5X60.5X1. (BLADE) ×2 IMPLANT
BRUSH FEMORAL CANAL (MISCELLANEOUS) IMPLANT
BRUSH SCRUB EZ PLAIN DRY (MISCELLANEOUS) ×6 IMPLANT
COVER SURGICAL LIGHT HANDLE (MISCELLANEOUS) ×3 IMPLANT
COVER WAND RF STERILE (DRAPES) ×3 IMPLANT
DRAPE INCISE IOBAN 85X60 (DRAPES) ×3 IMPLANT
DRAPE ORTHO SPLIT 77X108 STRL (DRAPES) ×6
DRAPE SURG ORHT 6 SPLT 77X108 (DRAPES) ×2 IMPLANT
DRAPE U-SHAPE 47X51 STRL (DRAPES) ×3 IMPLANT
DRSG MEPILEX BORDER 4X12 (GAUZE/BANDAGES/DRESSINGS) ×2 IMPLANT
DRSG MEPILEX BORDER 4X8 (GAUZE/BANDAGES/DRESSINGS) ×3 IMPLANT
ELECT BLADE 6.5 EXT (BLADE) IMPLANT
ELECT CAUTERY BLADE 6.4 (BLADE) IMPLANT
ELECT REM PT RETURN 9FT ADLT (ELECTROSURGICAL) ×3
ELECTRODE REM PT RTRN 9FT ADLT (ELECTROSURGICAL) ×1 IMPLANT
GLOVE BIO SURGEON STRL SZ7.5 (GLOVE) ×3 IMPLANT
GLOVE BIO SURGEON STRL SZ8 (GLOVE) ×3 IMPLANT
GLOVE BIOGEL PI IND STRL 8 (GLOVE) ×2 IMPLANT
GLOVE BIOGEL PI INDICATOR 8 (GLOVE) ×4
GOWN STRL REUS W/ TWL LRG LVL3 (GOWN DISPOSABLE) ×2 IMPLANT
GOWN STRL REUS W/ TWL XL LVL3 (GOWN DISPOSABLE) ×1 IMPLANT
GOWN STRL REUS W/TWL 2XL LVL3 (GOWN DISPOSABLE) IMPLANT
GOWN STRL REUS W/TWL LRG LVL3 (GOWN DISPOSABLE) ×6
GOWN STRL REUS W/TWL XL LVL3 (GOWN DISPOSABLE) ×3
HANDPIECE INTERPULSE COAX TIP (DISPOSABLE)
HEAD FEM UNIPOLAR 46 OD STRL (Hips) ×2 IMPLANT
KIT BASIN OR (CUSTOM PROCEDURE TRAY) ×3 IMPLANT
KIT TURNOVER KIT B (KITS) ×3 IMPLANT
MANIFOLD NEPTUNE II (INSTRUMENTS) ×3 IMPLANT
NDL 1/2 CIR MAYO (NEEDLE) IMPLANT
NEEDLE 1/2 CIR MAYO (NEEDLE) IMPLANT
NS IRRIG 1000ML POUR BTL (IV SOLUTION) ×3 IMPLANT
PACK TOTAL JOINT (CUSTOM PROCEDURE TRAY) ×3 IMPLANT
PAD ARMBOARD 7.5X6 YLW CONV (MISCELLANEOUS) ×6 IMPLANT
PILLOW ABDUCTION MEDIUM (MISCELLANEOUS) IMPLANT
PRESSURIZER FEMORAL UNIV (MISCELLANEOUS) IMPLANT
RETRIEVER SUT HEWSON (MISCELLANEOUS) ×3 IMPLANT
SET HNDPC FAN SPRY TIP SCT (DISPOSABLE) IMPLANT
SPACER FEM TAPERED +5 12/14 (Hips) ×2 IMPLANT
STAPLER VISISTAT 35W (STAPLE) ×3 IMPLANT
STEM SUMMIT BASIC PRESSFIT SZ3 (Hips) IMPLANT
SUMMIT BASIC PRESSFIT SZ3 (Hips) ×3 IMPLANT
SUT ETHILON 2 0 PSLX (SUTURE) ×6 IMPLANT
SUT FIBERWIRE #2 38 T-5 BLUE (SUTURE) ×6
SUT VIC AB 1 CT1 18XCR BRD 8 (SUTURE) ×1 IMPLANT
SUT VIC AB 1 CT1 27 (SUTURE) ×6
SUT VIC AB 1 CT1 27XBRD ANBCTR (SUTURE) ×1 IMPLANT
SUT VIC AB 1 CT1 8-18 (SUTURE) ×6
SUT VIC AB 2-0 CT1 27 (SUTURE) ×12
SUT VIC AB 2-0 CT1 TAPERPNT 27 (SUTURE) ×2 IMPLANT
SUTURE FIBERWR #2 38 T-5 BLUE (SUTURE) ×2 IMPLANT
TOWEL GREEN STERILE (TOWEL DISPOSABLE) ×3 IMPLANT
TOWEL GREEN STERILE FF (TOWEL DISPOSABLE) ×3 IMPLANT
TOWER CARTRIDGE SMART MIX (DISPOSABLE) IMPLANT
WATER STERILE IRR 1000ML POUR (IV SOLUTION) ×3 IMPLANT

## 2019-04-19 NOTE — H&P (Addendum)
TRH H&P    Patient Demographics:    [name], is a 78 y.o. female  MRN: [id number]  DOB - [birth year]  Admit Date - 04/18/2019  Referring MD/NP/PA: [place]  Outpatient Primary MD for the patient is Celene Squibb, MD  Patient coming from:  home  Chief complaint-  fall   HPI:    [name]  is a 78 y.o. female,  w gerd, hypothyroidism, RA,  hypertension, h/o CVA, dementia,  h/o breast cancer, neuropathy, presents with c/o fall at home in living room.  Family heard loud noise and found patient on the floor complaining of left hip/ leg pain. Per pt mechanical fall.   In ED,  T 98.3, P 94 R 20  Bp 157/72  Pox 99% on RA   Xray hip IMPRESSION: Displaced left femoral neck fracture.  CT brain / c spine IMPRESSION: 1. No acute intracranial hemorrhage. 2. Age-related atrophy and chronic microvascular ischemic changes. Old bilateral temporal infarcts. 3. No acute/traumatic cervical spine pathology. Extensive degenerative changes.  CXR IMPRESSION: No active disease.  Wbc 9.0, Hgb 12.4, Plt 138  Na 139, K 3.8, Bun 14, Creatinine 0.87 Trop 5  INR 1.1  Urinalysis pending covid - 19 pending  Pt will be admitted for w/up of left femoral neck fracture    Review of systems:    In addition to the HPI above,  No Fever-chills, No Headache, No changes with Vision or hearing, No problems swallowing food or Liquids, No Chest pain, Cough or Shortness of Breath, No Abdominal pain, No Nausea or Vomiting, bowel movements are regular, No Blood in stool or Urine, No dysuria, No new skin rashes or bruises,   No new weakness, tingling, numbness in any extremity, No recent weight gain or loss, No polyuria, polydypsia or polyphagia, No significant Mental Stressors.  All other systems reviewed and are negative.    Past History of the following :    Past Medical History:  Diagnosis Date  .  Anxiety   . Arm fracture, left   . Breast cancer ([place])   . Bulging disc   . Cardioembolic stroke ([place]) 15/72/6203  . Confusion   . Depression   . Difficulty swallowing solids   . GERD (gastroesophageal reflux disease)   . Hypertension   . Hypothyroidism   . Invasive ductal carcinoma of left breast ([place]) 02/24/2014  . Neuropathy   . Osteoporosis   . 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 Rotator cuff disorder, right   . Scoliosis   . Shortness of breath dyspnea    "I have reactive airway disease".      Past Surgical History:  Procedure Laterality Date  . ABDOMINAL HYSTERECTOMY    . Barbie Banner OSTEOTOMY Right 10/28/2014   Procedure: Barbie Banner OSTEOTOMY RIGHT FOOT;  Surgeon: Marcheta Grammes, DPM;  Location: [facility];  Service: Podiatry;  Laterality: Right;  . BIOPSY  05/15/2018   Procedure: BIOPSY;  Surgeon: Rogene Houston, MD;  Location: [facility];  Service: Endoscopy;;  gastric  . BONE  BIOPSY  09/29/2015   Procedure: BONE BIOPSY AND BONE CULTURE SECOND TOE RIGHT FOOT;  Surgeon: Caprice Beaver, DPM;  Location: [facility];  Service: Podiatry;;  . BREAST SURGERY Left    lumpectomy and axillary lymph node with re-excision 2 weeks later   . BUNIONECTOMY Right 10/28/2014   Procedure: SILVER BUNIONECTOMY RIGHT FOOT;  Surgeon: Marcheta Grammes, DPM;  Location: [facility];  Service: Podiatry;  Laterality: Right;  . CATARACT EXTRACTION W/PHACO Left 08/10/2017   Procedure: CATARACT EXTRACTION PHACO AND INTRAOCULAR LENS PLACEMENT LEFT EYE;  Surgeon: Baruch Goldmann, MD;  Location: [facility];  Service: Ophthalmology;  Laterality: Left;  CDE: 13.81  . CATARACT EXTRACTION W/PHACO Right 09/07/2017   Procedure: CATARACT EXTRACTION PHACO AND INTRAOCULAR LENS PLACEMENT RIGHT EYE;  Surgeon: Baruch Goldmann, MD;  Location: [facility];  Service: Ophthalmology;  Laterality: Right;  right  . COLONOSCOPY    . DILATION AND CURETTAGE OF UTERUS    . ESOPHAGEAL DILATION N/A 05/15/2018   Procedure: ESOPHAGEAL DILATION;   Surgeon: Rogene Houston, MD;  [facility];  Service: Endoscopy;  Laterality: N/A;  . esophageal stricture    . ESOPHAGOGASTRODUODENOSCOPY N/A 05/15/2018   Procedure: ESOPHAGOGASTRODUODENOSCOPY (EGD);  Surgeon: Rogene Houston, MD;  [facility];  Service: Endoscopy;  Laterality: N/A;  1:45  . HAMMER TOE SURGERY Right 10/28/2014   Procedure: HAMMER TOE CORRECTION 2ND AND 3RD TOE RIGHT FOOT;  Surgeon: Marcheta Grammes, DPM;  Location: [facility];  Service: Podiatry;  Laterality: Right;  . LUMBAR LAMINECTOMY/DECOMPRESSION MICRODISCECTOMY Left 03/13/2016   Procedure: Left Lumbar Four-Five, Lumbar Five-Sacral One Laminotomy/Foraminotomy;  Surgeon: Kevan Ny Ditty, MD;  Location: [facility];  Service: Neurosurgery;  Laterality: Left;  . REMOVAL OF IMPLANT Right 09/29/2015   Procedure: REMOVAL OF IMPLANT 2ND TOE RIGHT FOOT;  Surgeon: Caprice Beaver, DPM;  Location: [facility];  Service: Podiatry;  Laterality: Right;  . TEE WITHOUT CARDIOVERSION N/A 08/05/2018   Procedure: TRANSESOPHAGEAL ECHOCARDIOGRAM (TEE);  Surgeon: Skeet Latch, MD;  [facility] END[facility];  Service: Cardiovascular;  Laterality: N/A;      Social History:      Social History   Tobacco Use  . Smoking status: Never Smoker  . Smokeless tobacco: Never Used  Substance Use Topics  . Alcohol use: No       Family History :     Family History  Problem Relation Age of Onset  . Hypertension Mother   . Cancer Maternal Grandmother   . Cancer Cousin        Home Medications:   Prior to Admission medications   Medication Sig Start Date End Date Taking? Authorizing Provider  albuterol (PROAIR HFA) 108 (90 Base) MCG/ACT inhaler Inhale 2 puffs into the lungs every 6 (six) hours as needed for wheezing or shortness of breath.     [provider]  Calcium Carbonate-Vitamin D (CALTRATE 600+D) 600-400 MG-UNIT tablet Take 1 tablet by mouth 2 (two) times daily.    [provider]   cetirizine (ZYRTEC) 10 MG tablet Take 10 mg by mouth daily as needed for allergies.     [provider]  Cholecalciferol (VITAMIN D) 2000 UNITS CAPS Take 2,000 Units by mouth daily.     [provider]  clopidogrel (PLAVIX) 75 MG tablet Take 1 tablet (75 mg total) by mouth daily. 08/07/18   Shelly Coss, MD  Cranberry 125 MG TABS Take 1 tablet by mouth 3 (three) times daily as needed.    [provider]  denosumab (PROLIA) 60 MG/ML SOLN injection Inject 60 mg into the skin every 6 (six) months. Administer in upper arm, thigh, or abdomen, got in June 2019    [provider]  donepezil (ARICEPT) 10 MG tablet Take 1 tablet (10 mg total) by mouth at bedtime. 10/08/18   Garvin Fila, MD  DULoxetine (CYMBALTA) 60 MG capsule Take 60 mg by mouth daily.    [provider]  folic acid (FOLVITE) 528 MCG tablet Take 800 mcg by mouth daily.    [provider]  HYDROcodone-acetaminophen (NORCO/VICODIN) 5-325 MG tablet Take 1 tablet by mouth daily as needed for pain. 02/04/18   [provider]  lisinopril (PRINIVIL,ZESTRIL) 10 MG tablet Take 5 mg by mouth daily. Son says she takes it as needed when she feels her BP is high, she will check it and then take it if needed.    [provider]  Magnesium 500 MG CAPS Take 500 mg by mouth daily.     [provider]  memantine (NAMENDA) 10 MG tablet Take 5 mg by mouth 2 (two) times daily.     [provider]  methotrexate (RHEUMATREX) 2.5 MG tablet Take 22.5 mg by mouth once a week. Every Monday 08/31/15   [provider]  omeprazole (PRILOSEC) 20 MG capsule Take 20 mg by mouth 2 (two) times daily before a meal.    [provider]  OVER THE COUNTER MEDICATION Take 8 oz by mouth daily as needed (leg pain). Tonic Water with Quinine    [provider]  pantoprazole (PROTONIX) 40 MG tablet Take 1 tablet (40 mg total) by mouth daily. Patient not taking: Reported  on 10/02/2018 05/02/18   Butch Penny, NP  polyethylene glycol (MIRALAX / GLYCOLAX) packet Take 17 g by mouth daily. 08/07/18   Shelly Coss, MD  potassium chloride (K-DUR) 10 MEQ tablet Take 10 mEq by mouth at bedtime.    [provider]  senna (EQ NATURAL VEGETABLE LAXATIVE) 8.6 MG tablet Take 1 tablet by mouth daily as needed for constipation.    [provider]  Zinc 50 MG CAPS Take 50 mg by mouth daily.     [provider]     Allergies:     Allergies  Allergen Reactions  . Celecoxib Swelling and Other (See Comments)  . Ibuprofen Swelling and Other (See Comments)  . Diphenhydramine Rash and Other (See Comments)    Wide awake.   Micheline Maze [Propoxyphene N-Acetaminophen] Nausea And Vomiting     Physical Exam:   Vitals  Blood pressure (!) 165/97, pulse 92, temperature 98 F (36.7 C), temperature source Oral, resp. rate 18, SpO2 96 %.  1.  General: axoxo1  2. Psychiatric: euthymic  3. Neurologic: cn2-12 intact, reflexes 2+ symmetric, direct, consensual, near intact Motor 5/5 in all 4 ext  4. HEENMT:  Anicteric, pupils 1.38mm symmetric, direct, consensual, near intact  5. Respiratory : CTAB  6. Cardiovascular : rrr s1, s2, no m/g/r  7. Gastrointestinal:  Abd: soft, nt, nd, +bs  8. Skin:  Ext: no c/c/e,  No rash  9.Musculoskeletal:  Good ROM, except w pain with left leg movement per staff    Data Review:    CBC Recent Labs  Lab 04/18/19 2323  WBC 9.0  HGB 12.4  HCT 38.4  PLT 138*  MCV 97.5  MCH 31.5  MCHC 32.3  RDW 16.9*  LYMPHSABS 0.5*  MONOABS 0.3  EOSABS 0.0  BASOSABS 0.0   ------------------------------------------------------------------------------------------------------------------  Results for orders placed or performed during the hospital encounter of 04/18/19 (from the past 48 hour(s))  Basic metabolic panel     Status: Abnormal   Collection Time: 04/18/19 11:23 PM  Result Value Ref Range   Sodium  139 135 - 145 mmol/L   Potassium 3.8 3.5 - 5.1 mmol/L   Chloride 105 98 - 111 mmol/L   CO2 26 22 - 32 mmol/L   Glucose, Bld 130 (H) 70 - 99 mg/dL   BUN 14 8 - 23 mg/dL   Creatinine, Ser 0.87 0.44 - 1.00 mg/dL   Calcium 9.7 8.9 - 10.3 mg/dL   GFR calc non Af Amer >60 >60 mL/min   GFR calc Af Amer >60 >60 mL/min   Anion gap 8 5 - 15    Comment: Performed at [facility], [address], [place], [place] [address]  Troponin I (High Sensitivity)     Status: None   Collection Time: 04/18/19 11:23 PM  Result Value Ref Range   Troponin I (High Sensitivity) 5 <18 ng/L    Comment: (NOTE) Elevated high sensitivity troponin I (hsTnI) values and significant  changes across serial measurements may suggest ACS but many other  chronic and acute conditions are known to elevate hsTnI results.  Refer to the "Links" section for chest pain algorithms and additional  guidance. Performed at [facility], [address], [place], [place] [address]   CBC with Differential     Status: Abnormal   Collection Time: 04/18/19 11:23 PM  Result Value Ref Range   WBC 9.0 4.0 - 10.5 K/uL   RBC 3.94 3.87 - 5.11 MIL/uL   Hemoglobin 12.4 12.0 - 15.0 g/dL   HCT 38.4 36.0 - 46.0 %   MCV 97.5 80.0 - 100.0 fL   MCH 31.5 26.0 - 34.0 pg   MCHC 32.3 30.0 - 36.0 g/dL   RDW 16.9 (H) 11.5 - 15.5 %   Platelets 138 (L) 150 - 400 K/uL   nRBC 0.0 0.0 - 0.2 %   Neutrophils Relative % 90 %   Neutro Abs 8.1 (H) 1.7 - 7.7 K/uL   Lymphocytes Relative 5 %   Lymphs Abs 0.5 (L) 0.7 - 4.0 K/uL   Monocytes Relative 4 %   Monocytes Absolute 0.3 0.1 - 1.0 K/uL   Eosinophils Relative 0 %   Eosinophils Absolute 0.0 0.0 - 0.5 K/uL   Basophils Relative 0 %   Basophils Absolute 0.0 0.0 - 0.1 K/uL   Immature Granulocytes 1 %   Abs Immature Granulocytes 0.06 0.00 - 0.07 K/uL    Comment: Performed at [facility], [address] [place], [place] [address]  Protime-INR     Status: None   Collection Time: 04/18/19 11:23 PM  Result Value  Ref Range   Prothrombin Time 13.6 11.4 - 15.2 seconds   INR 1.1 0.8 - 1.2    Comment: (NOTE) INR goal varies based on device and disease states. Performed at [facility], [address], [place], [place] [address]     Chemistries  Recent Labs  Lab 04/18/19 2323  NA 139  K 3.8  CL 105  CO2 26  GLUCOSE 130*  BUN 14  CREATININE 0.87  CALCIUM 9.7   ------------------------------------------------------------------------------------------------------------------  ------------------------------------------------------------------------------------------------------------------ GFR: CrCl cannot be calculated (Unknown ideal weight.). Liver Function Tests: No results for input(s): AST, ALT, ALKPHOS, BILITOT, PROT, ALBUMIN in the last 168 hours. No results for input(s): LIPASE, AMYLASE in the last 168 hours. No results for input(s):  AMMONIA in the last 168 hours. Coagulation Profile: Recent Labs  Lab 04/18/19 2323  INR 1.1   Cardiac Enzymes: No results for input(s): CKTOTAL, CKMB, CKMBINDEX, TROPONINI in the last 168 hours. BNP (last 3 results) No results for input(s): PROBNP in the last 8760 hours. HbA1C: No results for input(s): HGBA1C in the last 72 hours. CBG: No results for input(s): GLUCAP in the last 168 hours. Lipid Profile: No results for input(s): CHOL, HDL, LDLCALC, TRIG, CHOLHDL, LDLDIRECT in the last 72 hours. Thyroid Function Tests: No results for input(s): TSH, T4TOTAL, FREET4, T3FREE, THYROIDAB in the last 72 hours. Anemia Panel: No results for input(s): VITAMINB12, FOLATE, FERRITIN, TIBC, IRON, RETICCTPCT in the last 72 hours.  --------------------------------------------------------------------------------------------------------------- Urine analysis:    Component Value Date/Time   COLORURINE YELLOW 08/29/2018 1216   APPEARANCEUR CLEAR 08/29/2018 1216   LABSPEC 1.017 08/29/2018 1216   PHURINE 6.0 08/29/2018 1216   GLUCOSEU NEGATIVE 08/29/2018 1216    HGBUR NEGATIVE 08/29/2018 1216   BILIRUBINUR NEGATIVE 08/29/2018 1216   KETONESUR 5 (A) 08/29/2018 1216   PROTEINUR NEGATIVE 08/29/2018 1216   NITRITE NEGATIVE 08/29/2018 1216   LEUKOCYTESUR NEGATIVE 08/29/2018 1216      Imaging Results:    Dg Chest 1 View  Result Date: 04/19/2019 CLINICAL DATA:  Preop left hip fracture EXAM: CHEST  1 VIEW COMPARISON:  None. FINDINGS: Mildly hyperexpanded lungs. Normal cardiomediastinal contours. No focal airspace disease. IMPRESSION: No active disease. Electronically Signed   By: Ulyses Jarred M.D.   On: 04/19/2019 00:20   Ct Head Wo Contrast  Result Date: 04/18/2019 CLINICAL DATA:  [age]-year-old female with admit missed fall. Altered mental status. History of dementia. EXAM: CT HEAD WITHOUT CONTRAST CT CERVICAL SPINE WITHOUT CONTRAST TECHNIQUE: Multidetector CT imaging of the head and cervical spine was performed following the standard protocol without intravenous contrast. Multiplanar CT image reconstructions of the cervical spine were also generated. COMPARISON:  Head CT dated 08/01/2018 FINDINGS: CT HEAD FINDINGS Brain: There is moderate age-related atrophy and chronic microvascular ischemic changes. Bilateral temporal hypodensities likely old infarcts. There is no acute intracranial hemorrhage. No mass effect or midline shift. No extra-axial fluid collection. Vascular: No hyperdense vessel or unexpected calcification. Skull: Normal. Negative for fracture or focal lesion. Sinuses/Orbits: No acute finding. Other: None CT CERVICAL SPINE FINDINGS Alignment: No acute subluxation. Mild reversal of normal cervical lordosis, likely secondary to degenerative changes. Skull base and vertebrae: No acute fracture. Osteopenia. Soft tissues and spinal canal: No prevertebral fluid or swelling. No visible canal hematoma. Disc levels: Extensive multilevel degenerative changes with endplate irregularity and disc space narrowing. Multilevel facet arthropathy. Upper chest:  Negative. Other: None IMPRESSION: 1. No acute intracranial hemorrhage. 2. Age-related atrophy and chronic microvascular ischemic changes. Old bilateral temporal infarcts. 3. No acute/traumatic cervical spine pathology. Extensive degenerative changes. Electronically Signed   By: Anner Crete M.D.   On: 04/18/2019 23:24   Ct Cervical Spine Wo Contrast  Result Date: 04/18/2019 CLINICAL DATA:  [age]-year-old female with admit missed fall. Altered mental status. History of dementia. EXAM: CT HEAD WITHOUT CONTRAST CT CERVICAL SPINE WITHOUT CONTRAST TECHNIQUE: Multidetector CT imaging of the head and cervical spine was performed following the standard protocol without intravenous contrast. Multiplanar CT image reconstructions of the cervical spine were also generated. COMPARISON:  Head CT dated 08/01/2018 FINDINGS: CT HEAD FINDINGS Brain: There is moderate age-related atrophy and chronic microvascular ischemic changes. Bilateral temporal hypodensities likely old infarcts. There is no acute intracranial hemorrhage. No mass effect or midline  shift. No extra-axial fluid collection. Vascular: No hyperdense vessel or unexpected calcification. Skull: Normal. Negative for fracture or focal lesion. Sinuses/Orbits: No acute finding. Other: None CT CERVICAL SPINE FINDINGS Alignment: No acute subluxation. Mild reversal of normal cervical lordosis, likely secondary to degenerative changes. Skull base and vertebrae: No acute fracture. Osteopenia. Soft tissues and spinal canal: No prevertebral fluid or swelling. No visible canal hematoma. Disc levels: Extensive multilevel degenerative changes with endplate irregularity and disc space narrowing. Multilevel facet arthropathy. Upper chest: Negative. Other: None IMPRESSION: 1. No acute intracranial hemorrhage. 2. Age-related atrophy and chronic microvascular ischemic changes. Old bilateral temporal infarcts. 3. No acute/traumatic cervical spine pathology. Extensive degenerative  changes. Electronically Signed   By: Anner Crete M.D.   On: 04/18/2019 23:24   Dg Hip Unilat With Pelvis 2-3 Views Left  Result Date: 04/19/2019 CLINICAL DATA:  Fall at home today with left hip pain. EXAM: DG HIP (WITH OR WITHOUT PELVIS) 2-3V LEFT COMPARISON:  None. FINDINGS: Displaced left femoral neck fracture. Proximal migration of the femoral shaft. Femoral head remains seated. Pubic rami and remainder of the bony pelvis is intact. Pubic symphysis and sacroiliac joints are congruent. IMPRESSION: Displaced left femoral neck fracture. Electronically Signed   By: Keith Rake M.D.   On: 04/19/2019 00:10       Assessment & Plan:    Principal Problem:   Femoral neck fracture ([place]) Active Problems:   Rheumatoid arthritis ([place])   Hypothyroidism   Hypertension   Dementia ([place])   History of stroke  L femoral neck fracture NPO  ED consulted Dr. Marcelino Scot who requested pt be transferred to [facility] Dilaudid 0.5mg  iv q4h prn  Zofran 4mg  iv q6h prn  H/o CVA Cont Plavix 75mg  po qday since not certain what day will have surgery  Hypertension Hold Lisinopril 5mg  po qday  Dementia Cont Aricept 10mg  po qday Cont Namenda 10mg  po bid  Gerd Cont PPI  RA Hold Methotrexate  Osteoporosis Cont Prolia as outpatient  Anxiety Cont Duloxetine   Anemia/ Thrombocytopenia Check cbc in am  DVT Prophylaxis-   Lovenox - SCDs   AM Labs Ordered, also please review Full Orders  Family Communication: Admission, patients condition and plan of care including tests being ordered have been discussed with the patient and daughter who indicate understanding and agree with the plan and Code Status.  Code Status:  FULL CODE, per her daughter who is her mpoa  Admission status: Inpatient: Based on patients clinical presentation and evaluation of above clinical data, I have made determination that patient meets Inpatient criteria at this time.   Time spent in minutes : 70 minutes   Jani Gravel M.D on  04/19/2019 at 1:46 AM

## 2019-04-19 NOTE — Consult Note (Signed)
Orthopaedic Trauma Service (OTS) Consult   Patient ID: [name] MRN: [id number] DOB/AGE: July 02, 1941 78 y.o.   Reason for Consult: displaced left femoral neck fracture Referring Physician: Jani Gravel, MD  HPI: [name] is an 78 y.o. female with dementia who has been cared for by her daughter, herself a home health nurse, that has been living in with her. Patient is ambulatory with a walker, occasionally without. Ground level fall at home. Pain and inability to ambulate. Complex medical history and sent here from [facility].  Past Medical History:  Diagnosis Date   Anxiety    Arm fracture, left    Breast cancer (HCC)    Bulging disc    Cardioembolic stroke ([place]) 55/73/2202   Confusion    Depression    Difficulty swallowing solids    GERD (gastroesophageal reflux disease)    Hypertension    Hypothyroidism    Invasive ductal carcinoma of left breast ([place]) 02/24/2014   Neuropathy    Osteoporosis    Rheumatoid arteritis (HCC)    RA   Rotator cuff disorder, right    Scoliosis    Shortness of breath dyspnea    "I have reactive airway disease".    Past Surgical History:  Procedure Laterality Date   ABDOMINAL HYSTERECTOMY     AIKEN OSTEOTOMY Right 10/28/2014   Procedure: Barbie Banner OSTEOTOMY RIGHT FOOT;  Surgeon: Marcheta Grammes, DPM;  Location: [facility];  Service: Podiatry;  Laterality: Right;   BIOPSY  05/15/2018   Procedure: BIOPSY;  Surgeon: Rogene Houston, MD;  Location[facility];  Service: Endoscopy;;  gastric   BONE BIOPSY  09/29/2015   Procedure: BONE BIOPSY AND BONE CULTURE SECOND TOE RIGHT FOOT;  Surgeon: Caprice Beaver, DPM;  Location: [facility];  Service: Podiatry;;   BREAST SURGERY Left    lumpectomy and axillary lymph node with re-excision 2 weeks later    BUNIONECTOMY Right 10/28/2014   Procedure: SILVER BUNIONECTOMY RIGHT FOOT;  Surgeon: Marcheta Grammes, DPM;  Location: [facility];  Service: Podiatry;  Laterality:  Right;   CATARACT EXTRACTION W/PHACO Left 08/10/2017   Procedure: CATARACT EXTRACTION PHACO AND INTRAOCULAR LENS PLACEMENT LEFT EYE;  Surgeon: Baruch Goldmann, MD;  Location: [facility];  Service: Ophthalmology;  Laterality: Left;  CDE: 13.81   CATARACT EXTRACTION W/PHACO Right 09/07/2017   Procedure: CATARACT EXTRACTION PHACO AND INTRAOCULAR LENS PLACEMENT RIGHT EYE;  Surgeon: Baruch Goldmann, MD;  Location: [facility];  Service: Ophthalmology;  Laterality: Right;  right   COLONOSCOPY     DILATION AND CURETTAGE OF UTERUS     ESOPHAGEAL DILATION N/A 05/15/2018   Procedure: ESOPHAGEAL DILATION;  Surgeon: Rogene Houston, MD;  [facility];  Service: Endoscopy;  Laterality: N/A;   esophageal stricture     ESOPHAGOGASTRODUODENOSCOPY N/A 05/15/2018   Procedure: ESOPHAGOGASTRODUODENOSCOPY (EGD);  Surgeon: Rogene Houston, MD;  [facility];  Service: Endoscopy;  Laterality: N/A;  1:45   HAMMER TOE SURGERY Right 10/28/2014   Procedure: HAMMER TOE CORRECTION 2ND AND 3RD TOE RIGHT FOOT;  Surgeon: Marcheta Grammes, DPM;  Location: [facility];  Service: Podiatry;  Laterality: Right;   LUMBAR LAMINECTOMY/DECOMPRESSION MICRODISCECTOMY Left 03/13/2016   Procedure: Left Lumbar Four-Five, Lumbar Five-Sacral One Laminotomy/Foraminotomy;  Surgeon: Kevan Ny Ditty, MD;  [facility];  Service: Neurosurgery;  Laterality: Left;   REMOVAL OF IMPLANT Right 09/29/2015   Procedure: REMOVAL OF IMPLANT 2ND TOE RIGHT FOOT;  Surgeon: Caprice Beaver,  DPM;  Location: [facility];  Service: Podiatry;  Laterality: Right;   TEE WITHOUT CARDIOVERSION N/A 08/05/2018   Procedure: TRANSESOPHAGEAL ECHOCARDIOGRAM (TEE);  Surgeon: Skeet Latch, MD;  Location: [facility] ENDOSCOPY;  Service: Cardiovascular;  Laterality: N/A;    Family History  Problem Relation Age of Onset   Hypertension Mother    Cancer Maternal Grandmother    Cancer Cousin     Social History:  reports that she has never smoked.  She has never used smokeless tobacco. She reports that she does not drink alcohol or use drugs.  Allergies:  Allergies  Allergen Reactions   Celecoxib Swelling and Other (See Comments)   Ibuprofen Swelling and Other (See Comments)   Diphenhydramine Rash and Other (See Comments)    Wide awake.    Wygesic [Propoxyphene N-Acetaminophen] Nausea And Vomiting    Medications:  Prior to Admission:  Medications Prior to Admission  Medication Sig Dispense Refill Last Dose   acetaminophen (TYLENOL) 325 MG tablet Take 500 mg by mouth every 6 (six) hours as needed for mild pain or moderate pain.   04/17/2019   albuterol (PROAIR HFA) 108 (90 Base) MCG/ACT inhaler Inhale 2 puffs into the lungs every 6 (six) hours as needed for wheezing or shortness of breath.    unknown   ALPRAZolam (XANAX) 0.25 MG tablet Take 0.25-50 mg by mouth 3 (three) times daily as needed for anxiety.    04/15/2019   Butenafine HCl 1 % cream Apply 1 application topically daily.   04/18/2019 at Unknown time   Calcium Carbonate-Vitamin D (CALTRATE 600+D) 600-400 MG-UNIT tablet Take 1 tablet by mouth 2 (two) times daily.   04/18/2019 at Unknown time   cetirizine (ZYRTEC) 10 MG tablet Take 10 mg by mouth daily as needed for allergies.    04/18/2019 at Unknown time   Cholecalciferol (VITAMIN D) 2000 UNITS CAPS Take 2,000 Units by mouth daily.    04/18/2019 at Unknown time   clopidogrel (PLAVIX) 75 MG tablet Take 1 tablet (75 mg total) by mouth daily. 30 tablet 0 04/18/2019 at Unknown time   Cranberry 125 MG TABS Take 1 tablet by mouth 3 (three) times daily as needed.   Past Week at Unknown time   denosumab (PROLIA) 60 MG/ML SOLN injection Inject 60 mg into the skin every 6 (six) months. Administer in upper arm, thigh, or abdomen, got in June 2019   unknown   donepezil (ARICEPT) 10 MG tablet Take 1 tablet (10 mg total) by mouth at bedtime. 180 tablet 1 <month   DULoxetine (CYMBALTA) 60 MG capsule Take 60 mg by mouth daily.    04/18/2019 at Unknown time   folic acid (FOLVITE) 818 MCG tablet Take 800 mcg by mouth daily.   04/18/2019 at Unknown time   HYDROcodone-acetaminophen (NORCO/VICODIN) 5-325 MG tablet Take 1 tablet by mouth every 6 (six) hours as needed for moderate pain or severe pain.    unknown   Magnesium 500 MG CAPS Take 500 mg by mouth daily.    04/17/2019 at Unknown time   memantine (NAMENDA) 10 MG tablet Take 5 mg by mouth 2 (two) times daily.    04/18/2019 at Unknown time   methotrexate (RHEUMATREX) 2.5 MG tablet Take 15 mg by mouth once a week. Every Monday   04/16/2019   MYRBETRIQ 25 MG TB24 tablet Take 25 mg by mouth daily.    04/17/2019   omeprazole (PRILOSEC) 20 MG capsule Take 20 mg by mouth 2 (two) times daily before a meal.  04/18/2019 at Unknown time   ondansetron (ZOFRAN) 4 MG tablet Take 4 mg by mouth every 8 (eight) hours as needed for nausea or vomiting.   04/18/2019 at Unknown time   polyethylene glycol (MIRALAX / GLYCOLAX) packet Take 17 g by mouth daily. 14 each 0 unknown   potassium chloride (K-DUR) 10 MEQ tablet Take 10 mEq by mouth at bedtime.   04/18/2019 at Unknown time   senna (EQ NATURAL VEGETABLE LAXATIVE) 8.6 MG tablet Take 1 tablet by mouth daily as needed for constipation.   unknown   Zinc 50 MG CAPS Take 50 mg by mouth daily.    04/18/2019 at Unknown time   lisinopril (PRINIVIL,ZESTRIL) 10 MG tablet Take 5 mg by mouth daily as needed (for blood pressure). Son says she takes it as needed when she feels her BP is high, she will check it and then take it if needed.   Not Taking at Unknown time   OVER THE COUNTER MEDICATION Take 8 oz by mouth daily as needed (leg pain). Tonic Water with Quinine   Not Taking at Unknown time    Results for orders placed or performed during the hospital encounter of 04/18/19 (from the past 48 hour(s))  Basic metabolic panel     Status: Abnormal   Collection Time: 04/18/19 11:23 PM  Result Value Ref Range   Sodium 139 135 - 145 mmol/L    Potassium 3.8 3.5 - 5.1 mmol/L   Chloride 105 98 - 111 mmol/L   CO2 26 22 - 32 mmol/L   Glucose, Bld 130 (H) 70 - 99 mg/dL   BUN 14 8 - 23 mg/dL   Creatinine, Ser 0.87 0.44 - 1.00 mg/dL   Calcium 9.7 8.9 - 10.3 mg/dL   GFR calc non Af Amer >60 >60 mL/min   GFR calc Af Amer >60 >60 mL/min   Anion gap 8 5 - 15    Comment: Performed at [facility], [address] [place], [place] [address]  Troponin I (High Sensitivity)     Status: None   Collection Time: 04/18/19 11:23 PM  Result Value Ref Range   Troponin I (High Sensitivity) 5 <18 ng/L    Comment: (NOTE) Elevated high sensitivity troponin I (hsTnI) values and significant  changes across serial measurements may suggest ACS but many other  chronic and acute conditions are known to elevate hsTnI results.  Refer to the "Links" section for chest pain algorithms and additional  guidance. Performed at [facility], [address], [place], [place] [address]   CBC with Differential     Status: Abnormal   Collection Time: 04/18/19 11:23 PM  Result Value Ref Range   WBC 9.0 4.0 - 10.5 K/uL   RBC 3.94 3.87 - 5.11 MIL/uL   Hemoglobin 12.4 12.0 - 15.0 g/dL   HCT 38.4 36.0 - 46.0 %   MCV 97.5 80.0 - 100.0 fL   MCH 31.5 26.0 - 34.0 pg   MCHC 32.3 30.0 - 36.0 g/dL   RDW 16.9 (H) 11.5 - 15.5 %   Platelets 138 (L) 150 - 400 K/uL   nRBC 0.0 0.0 - 0.2 %   Neutrophils Relative % 90 %   Neutro Abs 8.1 (H) 1.7 - 7.7 K/uL   Lymphocytes Relative 5 %   Lymphs Abs 0.5 (L) 0.7 - 4.0 K/uL   Monocytes Relative 4 %   Monocytes Absolute 0.3 0.1 - 1.0 K/uL   Eosinophils Relative 0 %   Eosinophils Absolute 0.0 0.0 -  0.5 K/uL   Basophils Relative 0 %   Basophils Absolute 0.0 0.0 - 0.1 K/uL   Immature Granulocytes 1 %   Abs Immature Granulocytes 0.06 0.00 - 0.07 K/uL    Comment: Performed at [facility], [address], [place], [place] [address]  Protime-INR     Status: None   Collection Time: 04/18/19 11:23 PM  Result Value Ref Range   Prothrombin  Time 13.6 11.4 - 15.2 seconds   INR 1.1 0.8 - 1.2    Comment: (NOTE) INR goal varies based on device and disease states. Performed at [facility], [address], [place], [place] [address]   SARS Coronavirus 2 [facility] order, Performed in [facility] hospital lab) Nasopharyngeal Nasopharyngeal Swab     Status: None   Collection Time: 04/19/19  1:20 AM   Specimen: Nasopharyngeal Swab  Result Value Ref Range   SARS Coronavirus 2 NEGATIVE NEGATIVE    Comment: (NOTE) If result is NEGATIVE SARS-CoV-2 target nucleic acids are NOT DETECTED. The SARS-CoV-2 RNA is generally detectable in upper and lower  respiratory specimens during the acute phase of infection. The lowest  concentration of SARS-CoV-2 viral copies this assay can detect is 250  copies / mL. A negative result does not preclude SARS-CoV-2 infection  and should not be used as the sole basis for treatment or other  patient management decisions.  A negative result may occur with  improper specimen collection / handling, submission of specimen other  than nasopharyngeal swab, presence of viral mutation(s) within the  areas targeted by this assay, and inadequate number of viral copies  (<250 copies / mL). A negative result must be combined with clinical  observations, patient history, and epidemiological information. If result is POSITIVE SARS-CoV-2 target nucleic acids are DETECTED. The SARS-CoV-2 RNA is generally detectable in upper and lower  respiratory specimens dur ing the acute phase of infection.  Positive  results are indicative of active infection with SARS-CoV-2.  Clinical  correlation with patient history and other diagnostic information is  necessary to determine patient infection status.  Positive results do  not rule out bacterial infection or co-infection with other viruses. If result is PRESUMPTIVE POSTIVE SARS-CoV-2 nucleic acids MAY BE PRESENT.   A presumptive positive result was obtained on the submitted  specimen  and confirmed on repeat testing.  While 2019 novel coronavirus  (SARS-CoV-2) nucleic acids may be present in the submitted sample  additional confirmatory testing may be necessary for epidemiological  and / or clinical management purposes  to differentiate between  SARS-CoV-2 and other Sarbecovirus currently known to infect humans.  If clinically indicated additional testing with an alternate test  methodology [id number]) is advised. The SARS-CoV-2 RNA is generally  detectable in upper and lower respiratory sp ecimens during the acute  phase of infection. The expected result is Negative. Fact Sheet for Patients:  StrictlyIdeas.no Fact Sheet for Healthcare Providers: BankingDealers.co.za This test is not yet approved or cleared by the [place] FDA and has been authorized for detection and/or diagnosis of SARS-CoV-2 by FDA under an Emergency Use Authorization (EUA).  This EUA will remain in effect (meaning this test can be used) for the duration of the COVID-19 declaration under Section 564(b)(1) of the Act, 21 U.S.C. section 360bbb-3(b)(1), unless the authorization is terminated or revoked sooner. Performed at [facility], [address] [place], [place] [address]   Urinalysis, Routine w reflex microscopic     Status: Abnormal   Collection Time: 04/19/19  1:26 AM  Result Value Ref Range   Color, Urine YELLOW YELLOW   APPearance CLOUDY (A) CLEAR   Specific Gravity, Urine 1.012 1.005 - 1.030   pH 8.0 5.0 - 8.0   Glucose, UA NEGATIVE NEGATIVE mg/dL   Hgb urine dipstick NEGATIVE NEGATIVE   Bilirubin Urine NEGATIVE NEGATIVE   Ketones, ur NEGATIVE NEGATIVE mg/dL   Protein, ur NEGATIVE NEGATIVE mg/dL   Nitrite NEGATIVE NEGATIVE   Leukocytes,Ua NEGATIVE NEGATIVE    Comment: Performed at [facility], [address], [place], [place] [address]  Troponin I (High Sensitivity)     Status: None   Collection Time: 04/19/19  2:53 AM    Result Value Ref Range   Troponin I (High Sensitivity) 5 <18 ng/L    Comment: (NOTE) Elevated high sensitivity troponin I (hsTnI) values and significant  changes across serial measurements may suggest ACS but many other  chronic and acute conditions are known to elevate hsTnI results.  Refer to the "Links" section for chest pain algorithms and additional  guidance. Performed at [facility], [address], [place], [place] [address]   Comprehensive metabolic panel     Status: Abnormal   Collection Time: 04/19/19  2:53 AM  Result Value Ref Range   Sodium 137 135 - 145 mmol/L   Potassium 3.8 3.5 - 5.1 mmol/L   Chloride 105 98 - 111 mmol/L   CO2 27 22 - 32 mmol/L   Glucose, Bld 135 (H) 70 - 99 mg/dL   BUN 14 8 - 23 mg/dL   Creatinine, Ser 0.76 0.44 - 1.00 mg/dL   Calcium 9.2 8.9 - 10.3 mg/dL   Total Protein 6.1 (L) 6.5 - 8.1 g/dL   Albumin 3.1 (L) 3.5 - 5.0 g/dL   AST 33 15 - 41 U/L   ALT 21 0 - 44 U/L   Alkaline Phosphatase 113 38 - 126 U/L   Total Bilirubin 0.8 0.3 - 1.2 mg/dL   GFR calc non Af Amer >60 >60 mL/min   GFR calc Af Amer >60 >60 mL/min   Anion gap 5 5 - 15    Comment: Performed at [facility], [address], [place], [place] [address]  CBC     Status: Abnormal   Collection Time: 04/19/19  2:53 AM  Result Value Ref Range   WBC 7.8 4.0 - 10.5 K/uL   RBC 3.79 (L) 3.87 - 5.11 MIL/uL   Hemoglobin 11.8 (L) 12.0 - 15.0 g/dL   HCT 36.9 36.0 - 46.0 %   MCV 97.4 80.0 - 100.0 fL   MCH 31.1 26.0 - 34.0 pg   MCHC 32.0 30.0 - 36.0 g/dL   RDW 16.8 (H) 11.5 - 15.5 %   Platelets 120 (L) 150 - 400 K/uL    Comment: SPECIMEN CHECKED FOR CLOTS   nRBC 0.0 0.0 - 0.2 %    Comment: Performed at [facility], [address] [place], [place] [address]  Type and screen [place]     Status: None   Collection Time: 04/19/19  2:57 PM  Result Value Ref Range   ABO/RH(D) O POS    Antibody Screen NEG    Sample Expiration      04/22/2019,2359 Performed at [facility], [place] [address] [place],  [address]   ABO/Rh     Status: None (Preliminary result)   Collection Time: 04/19/19  2:57 PM  Result Value Ref Range   ABO/RH(D)      O POS Performed at  [facility], [place] [address] [place], [place] [address]     Dg Chest 1 View  Result Date: 04/19/2019 CLINICAL DATA:  Preop left hip fracture EXAM: CHEST  1 VIEW COMPARISON:  None. FINDINGS: Mildly hyperexpanded lungs. Normal cardiomediastinal contours. No focal airspace disease. IMPRESSION: No active disease. Electronically Signed   By: Ulyses Jarred M.D.   On: 04/19/2019 00:20   Ct Head Wo Contrast  Result Date: 04/18/2019 CLINICAL DATA:  [age]-year-old female with admit missed fall. Altered mental status. History of dementia. EXAM: CT HEAD WITHOUT CONTRAST CT CERVICAL SPINE WITHOUT CONTRAST TECHNIQUE: Multidetector CT imaging of the head and cervical spine was performed following the standard protocol without intravenous contrast. Multiplanar CT image reconstructions of the cervical spine were also generated. COMPARISON:  Head CT dated 08/01/2018 FINDINGS: CT HEAD FINDINGS Brain: There is moderate age-related atrophy and chronic microvascular ischemic changes. Bilateral temporal hypodensities likely old infarcts. There is no acute intracranial hemorrhage. No mass effect or midline shift. No extra-axial fluid collection. Vascular: No hyperdense vessel or unexpected calcification. Skull: Normal. Negative for fracture or focal lesion. Sinuses/Orbits: No acute finding. Other: None CT CERVICAL SPINE FINDINGS Alignment: No acute subluxation. Mild reversal of normal cervical lordosis, likely secondary to degenerative changes. Skull base and vertebrae: No acute fracture. Osteopenia. Soft tissues and spinal canal: No prevertebral fluid or swelling. No visible canal hematoma. Disc levels: Extensive multilevel degenerative changes with endplate irregularity and disc space narrowing. Multilevel facet  arthropathy. Upper chest: Negative. Other: None IMPRESSION: 1. No acute intracranial hemorrhage. 2. Age-related atrophy and chronic microvascular ischemic changes. Old bilateral temporal infarcts. 3. No acute/traumatic cervical spine pathology. Extensive degenerative changes. Electronically Signed   By: Anner Crete M.D.   On: 04/18/2019 23:24   Ct Cervical Spine Wo Contrast  Result Date: 04/18/2019 CLINICAL DATA:  [age]-year-old female with admit missed fall. Altered mental status. History of dementia. EXAM: CT HEAD WITHOUT CONTRAST CT CERVICAL SPINE WITHOUT CONTRAST TECHNIQUE: Multidetector CT imaging of the head and cervical spine was performed following the standard protocol without intravenous contrast. Multiplanar CT image reconstructions of the cervical spine were also generated. COMPARISON:  Head CT dated 08/01/2018 FINDINGS: CT HEAD FINDINGS Brain: There is moderate age-related atrophy and chronic microvascular ischemic changes. Bilateral temporal hypodensities likely old infarcts. There is no acute intracranial hemorrhage. No mass effect or midline shift. No extra-axial fluid collection. Vascular: No hyperdense vessel or unexpected calcification. Skull: Normal. Negative for fracture or focal lesion. Sinuses/Orbits: No acute finding. Other: None CT CERVICAL SPINE FINDINGS Alignment: No acute subluxation. Mild reversal of normal cervical lordosis, likely secondary to degenerative changes. Skull base and vertebrae: No acute fracture. Osteopenia. Soft tissues and spinal canal: No prevertebral fluid or swelling. No visible canal hematoma. Disc levels: Extensive multilevel degenerative changes with endplate irregularity and disc space narrowing. Multilevel facet arthropathy. Upper chest: Negative. Other: None IMPRESSION: 1. No acute intracranial hemorrhage. 2. Age-related atrophy and chronic microvascular ischemic changes. Old bilateral temporal infarcts. 3. No acute/traumatic cervical spine pathology.  Extensive degenerative changes. Electronically Signed   By: Anner Crete M.D.   On: 04/18/2019 23:24   Ct Abdomen Pelvis W Contrast  Result Date: 04/19/2019 CLINICAL DATA:  78 year old female with and abdominal trauma. Left hip fracture. EXAM: CT ABDOMEN AND PELVIS WITH CONTRAST TECHNIQUE: Multidetector CT imaging of the abdomen and pelvis was performed using the standard protocol following bolus administration of intravenous contrast. CONTRAST:  176mL OMNIPAQUE IOHEXOL 300 MG/ML  SOLN COMPARISON:  CT of  the abdomen pelvis dated 08/29/2018 FINDINGS: Lower chest: Bibasilar dependent atelectatic changes. There is compressive atelectasis of the left lower lobe secondary to a large hiatal hernia. There is mild cardiomegaly. Calcification of the mitral annulus noted. No intra-abdominal free air or free fluid. Hepatobiliary: The liver is unremarkable. No intrahepatic biliary ductal dilatation. No calcified gallstone or pericholecystic fluid. Pancreas: Unremarkable. No pancreatic ductal dilatation or surrounding inflammatory changes. Spleen: Normal in size without focal abnormality. Adrenals/Urinary Tract: The adrenal glands are unremarkable as visualized. There is a 3 cm left renal upper pole cyst and additional smaller bilateral peripelvic cyst. There is no hydronephrosis on either side. There is symmetric enhancement and excretion of contrast by both kidneys. The visualized ureters and urinary bladder appear unremarkable. Stomach/Bowel: Scattered sigmoid diverticula without active inflammatory changes. There is a large hiatal hernia. Large amount of stool noted throughout the colon. There is no bowel obstruction or active inflammation. The appendix is normal. Vascular/Lymphatic: Mild aortoiliac atherosclerotic disease. The IVC is unremarkable. No portal venous gas. There is no adenopathy. Reproductive: Hysterectomy. No pelvic mass. Other: None Musculoskeletal: Osteopenia with scoliosis and degenerative changes  of the spine. There is a displaced fracture of the left femoral neck with mild proximal migration of the femoral shaft. No dislocation. No definite other acute osseous pathology identified. IMPRESSION: 1. Displaced left femoral neck fracture. 2. Large hiatal hernia. 3. Colonic diverticulosis. No bowel obstruction or active inflammation. Normal appendix. Aortic Atherosclerosis (ICD10-I70.0). Electronically Signed   By: Anner Crete M.D.   On: 04/19/2019 03:16   Dg Hip Unilat With Pelvis 2-3 Views Left  Result Date: 04/19/2019 CLINICAL DATA:  Fall at home today with left hip pain. EXAM: DG HIP (WITH OR WITHOUT PELVIS) 2-3V LEFT COMPARISON:  None. FINDINGS: Displaced left femoral neck fracture. Proximal migration of the femoral shaft. Femoral head remains seated. Pubic rami and remainder of the bony pelvis is intact. Pubic symphysis and sacroiliac joints are congruent. IMPRESSION: Displaced left femoral neck fracture. Electronically Signed   By: Keith Rake M.D.   On: 04/19/2019 00:10    ROS Daughter reports no recent significant changes but continued deterioration and significant caretaker fatigue Blood pressure (!) 181/99, pulse (!) 107, temperature 98 F (36.7 C), temperature source Oral, resp. rate 17, SpO2 94 %. Physical Exam NCAT, grimacing, not interactice LLE Shortened, tender  Edema/ swelling controlled  Sens: DPN, SPN, TN intact  Motor: EHL, FHL, and lessor toe ext and flex all intact grossly  Brisk cap refill, warm to touch, DP palp      Gait: Unable to assess Coordination and balance: unable to assess   Assessment/Plan: Displaced femoral neck fracture End stage dementia Severe caretaker fatigue/ exhaustion  I discussed with the patient's daughter the risks and benefits of surgery with hemiarthroplasty, including the possibility of death, infection, limb length inequality, nerve injury, vessel injury, wound breakdown, arthritis, symptomatic hardware, DVT/ PE, loss of  motion, malunion, nonunion, and need for further surgery among others.  We also specifically nonsurgical management but that this would result in significant pain even when nonambulatory and result in loss of ambulation. She acknowledged these risks and wished to proceed.  Weightbearing: WBAT LLE posterior hip precautions Insicional and dressing care: Reinforce dressings as needed Orthopedic device(s): None Showering: OK after POD 2 VTE prophylaxis: per primary service given dementia but anticipate resumption of Plavix on POD 2 Pain control: Norco if needed, o/w tylenol Follow - up plan: 2 weeks Contact information:  Altamese Oktibbeha MD, Ainsley Spinner PA  Altamese Mercer, MD [facility], [facility] [phone]  04/19/2019, 4:54 PM  [facility] [place] [address] [phone] (F)

## 2019-04-19 NOTE — Anesthesia Procedure Notes (Signed)
Procedure Name: Intubation Date/Time: 04/19/2019 5:04 PM Performed by: Suzy Bouchard, CRNA Pre-anesthesia Checklist: Patient identified, Emergency Drugs available, Suction available, Patient being monitored and Timeout performed Patient Re-evaluated:Patient Re-evaluated prior to induction Oxygen Delivery Method: Circle system utilized Preoxygenation: Pre-oxygenation with 100% oxygen Induction Type: IV induction and Rapid sequence Laryngoscope Size: Miller and 2 Grade View: Grade III Tube type: Oral Tube size: 7.0 mm Number of attempts: 1 Airway Equipment and Method: Stylet Placement Confirmation: ETT inserted through vocal cords under direct vision,  positive ETCO2 and breath sounds checked- equal and bilateral Secured at: 20 cm Tube secured with: Tape Dental Injury: Teeth and Oropharynx as per pre-operative assessment

## 2019-04-19 NOTE — Anesthesia Preprocedure Evaluation (Addendum)
Anesthesia Evaluation  Patient identified by MRN, date of birth, ID band Patient awake    Reviewed: Allergy & Precautions, H&P , NPO status , Patient's Chart, lab work & pertinent test results  Airway Mallampati: II  TM Distance: >3 FB Neck ROM: Full    Dental no notable dental hx. (+) Poor Dentition, Dental Advisory Given   Pulmonary neg pulmonary ROS,    Pulmonary exam normal breath sounds clear to auscultation       Cardiovascular hypertension, Pt. on medications  Rhythm:Regular Rate:Normal     Neuro/Psych Anxiety Depression Dementia CVA    GI/Hepatic Neg liver ROS, GERD  Medicated and Controlled,  Endo/Other  Hypothyroidism   Renal/GU negative Renal ROS  negative genitourinary   Musculoskeletal  (+) Arthritis , Osteoarthritis,    Abdominal   Peds  Hematology negative hematology ROS (+)   Anesthesia Other Findings   Reproductive/Obstetrics negative OB ROS                            Anesthesia Physical Anesthesia Plan  ASA: II  Anesthesia Plan: General   Post-op Pain Management:    Induction: Intravenous  PONV Risk Score and Plan: 4 or greater and Ondansetron, Dexamethasone and Treatment may vary due to age or medical condition  Airway Management Planned: Oral ETT  Additional Equipment:   Intra-op Plan:   Post-operative Plan: Extubation in OR  Informed Consent: I have reviewed the patients History and Physical, chart, labs and discussed the procedure including the risks, benefits and alternatives for the proposed anesthesia with the patient or authorized representative who has indicated his/her understanding and acceptance.     Dental advisory given  Plan Discussed with: CRNA  Anesthesia Plan Comments:         Anesthesia Quick Evaluation

## 2019-04-19 NOTE — Anesthesia Postprocedure Evaluation (Signed)
Anesthesia Post Note  Patient: [name]  Procedure(s) Performed: ARTHROPLASTY BIPOLAR HIP (HEMIARTHROPLASTY) (Left Hip)     Patient location during evaluation: PACU Anesthesia Type: General Level of consciousness: awake and alert Pain management: pain level controlled Vital Signs Assessment: post-procedure vital signs reviewed and stable Respiratory status: spontaneous breathing, nonlabored ventilation, respiratory function stable and patient connected to nasal cannula oxygen Cardiovascular status: blood pressure returned to baseline and stable Postop Assessment: no apparent nausea or vomiting Anesthetic complications: no    Last Vitals:  Vitals:   04/19/19 2030 04/19/19 2034  BP:  (!) 155/90  Pulse: 93 93  Resp: 16 14  Temp: (!) 36.4 C   SpO2: 98% 96%    Last Pain:  Vitals:   04/19/19 0126  TempSrc: Oral                 Levy Wellman,W. EDMOND

## 2019-04-19 NOTE — ED Notes (Signed)
BP only in right arm per patient daughter

## 2019-04-19 NOTE — ED Notes (Signed)
Pt returned from ct scan at this time. Pt resting comfortably

## 2019-04-19 NOTE — Progress Notes (Signed)
   Patient seen and evaluated, chart reviewed, please see EMR for updated orders. Please see full H&P dictated by admitting physician Dr Maudie Mercury for same date of service.   Brief summary 78 y.o. female,  w gerd, hypothyroidism, RA,  hypertension, h/o CVA, dementia,  h/o breast cancer, neuropathy admitted on 04/19/2019 with left displaced femoral neck fracture after mechanical fall  --Plavix and Lovenox has been put on hold to allow for possible operative intervention  --May use IV hydralazine as needed elevated BP  --Patient being transferred to [facility] for orthopedic consult and orthopedic/operative intervention for left femoral neck fracture  --Plan of care discussed with patient's daughter [name] (at [facility], ED)  at bedside prior to transfer  Patient seen and evaluated, chart reviewed, please see EMR for updated orders. Please see full H&P dictated by admitting physician Dr Maudie Mercury for same date of service.   Roxan Hockey, MD

## 2019-04-19 NOTE — Transfer of Care (Signed)
Immediate Anesthesia Transfer of Care Note  Patient: [name]  Procedure(s) Performed: ARTHROPLASTY BIPOLAR HIP (HEMIARTHROPLASTY) (Left Hip)  Patient Location: PACU  Anesthesia Type:General  Level of Consciousness: sedated and responds to stimulation  Airway & Oxygen Therapy: Patient Spontanous Breathing and Patient connected to nasal cannula oxygen  Post-op Assessment: Report given to RN and Post -op Vital signs reviewed and stable  Post vital signs: Reviewed and stable  Last Vitals:  Vitals Value Taken Time  BP 169/86 04/19/19 1934  Temp    Pulse 89 04/19/19 1939  Resp 13 04/19/19 1939  SpO2 98 % 04/19/19 1939  Vitals shown include unvalidated device data.  Last Pain:  Vitals:   04/19/19 0126  TempSrc: Oral         Complications: No apparent anesthesia complications

## 2019-04-19 NOTE — ED Notes (Signed)
Left leg noticeable shorter and rotated.  peripheral pulse 2+.  PERRLA.

## 2019-04-19 NOTE — ED Notes (Signed)
Pt moaning in pain. PRN pain meds given. Pt sats dropped to 85%. Oxygen applied oxygen up to 93% on 2L.pt resting at this time with pain eased.

## 2019-04-19 NOTE — ED Notes (Signed)
PT's daughter [name] updated about room assignment and ETA of [facility]. Daughter to sign for Transfer when she arrives to ED in about 15 min.

## 2019-04-19 NOTE — ED Notes (Signed)
Report given to [facility] at this time. ETA 30 min.

## 2019-04-20 ENCOUNTER — Encounter (HOSPITAL_COMMUNITY): Payer: Self-pay | Admitting: *Deleted

## 2019-04-20 DIAGNOSIS — M069 Rheumatoid arthritis, unspecified: Secondary | ICD-10-CM

## 2019-04-20 LAB — URINE CULTURE: Culture: NO GROWTH

## 2019-04-20 LAB — CBC
HCT: 37.6 % (ref 36.0–46.0)
HCT: 33.5 % — ABNORMAL LOW (ref 36.0–46.0)
Hemoglobin: 12.3 g/dL (ref 12.0–15.0)
Hemoglobin: 10.9 g/dL — ABNORMAL LOW (ref 12.0–15.0)
MCH: 31.9 pg (ref 26.0–34.0)
MCH: 32 pg (ref 26.0–34.0)
MCHC: 32.7 g/dL (ref 30.0–36.0)
MCHC: 32.5 g/dL (ref 30.0–36.0)
MCV: 97.7 fL (ref 80.0–100.0)
MCV: 98.2 fL (ref 80.0–100.0)
Platelets: 153 10*3/uL (ref 150–400)
Platelets: 162 10*3/uL (ref 150–400)
RBC: 3.85 MIL/uL — ABNORMAL LOW (ref 3.87–5.11)
RBC: 3.41 MIL/uL — ABNORMAL LOW (ref 3.87–5.11)
RDW: 16.9 % — ABNORMAL HIGH (ref 11.5–15.5)
RDW: 16.9 % — ABNORMAL HIGH (ref 11.5–15.5)
WBC: 14.5 10*3/uL — ABNORMAL HIGH (ref 4.0–10.5)
WBC: 12.5 10*3/uL — ABNORMAL HIGH (ref 4.0–10.5)
nRBC: 0 % (ref 0.0–0.2)
nRBC: 0 % (ref 0.0–0.2)

## 2019-04-20 LAB — COMPREHENSIVE METABOLIC PANEL
ALT: 19 U/L (ref 0–44)
AST: 30 U/L (ref 15–41)
Albumin: 2.6 g/dL — ABNORMAL LOW (ref 3.5–5.0)
Alkaline Phosphatase: 87 U/L (ref 38–126)
Anion gap: 7 (ref 5–15)
BUN: 12 mg/dL (ref 8–23)
CO2: 26 mmol/L (ref 22–32)
Calcium: 8.9 mg/dL (ref 8.9–10.3)
Chloride: 105 mmol/L (ref 98–111)
Creatinine, Ser: 0.96 mg/dL (ref 0.44–1.00)
GFR calc Af Amer: >60 mL/min (ref >60)
GFR calc non Af Amer: 56 mL/min — ABNORMAL LOW (ref >60)
Glucose, Bld: 149 mg/dL — ABNORMAL HIGH (ref 70–99)
Potassium: 3.9 mmol/L (ref 3.5–5.1)
Sodium: 138 mmol/L (ref 135–145)
Total Bilirubin: 0.7 mg/dL (ref 0.3–1.2)
Total Protein: 5.6 g/dL — ABNORMAL LOW (ref 6.5–8.1)

## 2019-04-20 LAB — CREATININE, SERUM
Creatinine, Ser: 0.79 mg/dL (ref 0.44–1.00)
GFR calc Af Amer: >60 mL/min (ref >60)
GFR calc non Af Amer: >60 mL/min (ref >60)

## 2019-04-20 NOTE — Progress Notes (Signed)
PROGRESS NOTE    [name]  NWG:[id number] DOB: 12/17/1940 DOA: 04/18/2019 PCP: Celene Squibb, MD   Brief Narrative:  HPI per Dr. Jani Gravel on 04/19/2019  [name]  is a 78 y.o. female,  w gerd, hypothyroidism, RA,  hypertension, h/o CVA, dementia,  h/o breast cancer, neuropathy, presents with c/o fall at home in living room.  Family heard loud noise and found patient on the floor complaining of left hip/ leg pain. Per pt mechanical fall.   In ED,  T 98.3, P 94 R 20  Bp 157/72  Pox 99% on RA   Xray hip IMPRESSION: Displaced left femoral neck fracture.  CT brain / c spine IMPRESSION: 1. No acute intracranial hemorrhage. 2. Age-related atrophy and chronic microvascular ischemic changes. Old bilateral temporal infarcts. 3. No acute/traumatic cervical spine pathology. Extensive degenerative changes.  CXR IMPRESSION: No active disease.  Wbc 9.0, Hgb 12.4, Plt 138  Na 139, K 3.8, Bun 14, Creatinine 0.87 Trop 5  INR 1.1  Urinalysis pending covid - 19 pending  Pt will be admitted for w/up of left femoral neck fracture  **Interim History Orthopedic surgery evaluated the patient for a hip arthroplasty.  She is postoperative day 1 and she remains significantly demented and unable to provide a subjective history.  Assessment & Plan:   Principal Problem:   Femoral neck fracture (HCC) Active Problems:   Rheumatoid arthritis (HCC)   Hypothyroidism   Hypertension   Dementia (HCC)   History of stroke  Displaced L femoral neck fracture is post left hip arthroplasty by Dr. Lincoln Brigham postoperative day 1 -No longer n.p.o. and her Plavix and Lovenox were held due to operative intervention; resume when okay with orthopedic surgery -Continue pain control with acetaminophen 500 g p.o. every 8 scheduled along with acetaminophen 650 mg p.o./RC every 6 as needed for mild pain or fever greater than 101; continue with hydrocodone-acetaminophen 7.5-325 mg 1 to 2 tablets every 4  hours PRN for severe pain and hydrocodone-acetaminophen 5-325 mg 1 to 2 tablets p.o. every 4 PRN for moderate pain for pain score 4-6 -We will discontinue with Dilaudid 0.5 mg IV every 4 PRN for severe pain as she has IV morphine 0.5-1 mg IV every 2 as needed severe pain for pain score 7-10 uncontrolled with p.o. analgesics -Continue with supportive care and continue with antiemetics with 4 mg p.o./IV every 6 as needed for nausea as well as a bowel regimen with senna 8.6 mg p.o. daily PRN as well as MiraLAX 17 g p.o. daily -Further care per orthopedic surgery -PT OT evaluated and recommending skilled nursing facility -At baseline patient is ambulatory with a walker and occasionally without it -Orthopedic recommending weightbearing left lower extremity posterior hip precautions  H/o CVA -Plavix was held for operative intervention -Resume when okay with orthopedics and they will likely think postoperative day 2 resume tomorrow at 10 AM  Hypertension -Continue to hold Lisinopril 5mg  po qday in a.m. if blood pressures remain stable -As blood pressure is 125/70  Dementia -Continue with donepezil 10 mg p.o. nightly along with memantine 5 mg p.o. twice daily  Gerd -Cont PPI with pantoprazole 40 mg p.o. daily  RA -Continue to hold Methotrexate  Osteoporosis -We will need to continue Prolia as outpatient  Anxiety and Depression -Continue with Duloxetine 60 mg p.o. daily  Leukocytosis -Patient's leukocytosis likely elevated in the setting of surgery and pain -Patient is WBC went from 7.8 and trended all the way up to 14.5 is  now 12.5 -Continue to monitor for signs and symptoms of infection -Repeat CBC in a.m.  Normocytic anemia -Likely drop from surgical intervention -Patient's hemoglobin/hematocrit went from 11.8/36.9 is now 10.9/33.5 -Check anemia panel in a.m. -Continue to monitor for signs and symptoms of bleeding; currently no overt bleeding noted -Repeat CBC in  a.m.  Thrombocytopenia -Patient's platelet count was 120 on admission and is now trended up to 162 -Continue to monitor for signs and symptoms of bleeding -Repeat CBC in a.m.  Hyperglycemia -Patient blood sugar has been ranging from 1 30-1 49 -Check hemoglobin A1c in the a.m. -Continue to monitor blood sugars carefully and if necessary will need to place on sensitive NovoLog sliding scale insulin AC  DVT prophylaxis: Enoxaparin 40 mg subcu every 24 Code Status: FULL CODE  Family Communication: No family present at bedside  Disposition Plan: SNF when medically stable  Consultants:  Orthopedic Surgery    Procedures:  Left Arthroplasty Bipolar Hip (Hemiarthorplasty) Done by Dr. Marcelino Scot on 04/19/2019  Antimicrobials:  Anti-infectives (From admission, onward)   Start     Dose/Rate Route Frequency Ordered Stop   04/19/19 2115  ceFAZolin (ANCEF) IVPB 2g/100 mL premix     2 g 200 mL/hr over 30 Minutes Intravenous Every 6 hours 04/19/19 2111 04/20/19 0552   04/19/19 1644  ceFAZolin (ANCEF) 2-4 GM/100ML-% IVPB    Note to Pharmacy: Ivar Drape   : cabinet override      04/19/19 1644 04/20/19 0459     Subjective: Seen and examined at bedside and she is extremely demented and unable to provide a subjective history.  Had her oxygen hanging off her nose and when I try to place it back she swatted at me.  Did not provide a subjective history and did not complain of anything.  Nurse tech was attempting to feed her but patient did not want to eat.  No other concerns or complaints at this time and left hip has an ice pack on it.  Objective: Vitals:   04/19/19 2105 04/19/19 2333 04/20/19 0538 04/20/19 0900  BP: (!) 159/84 (!) 141/88 127/74 125/78  Pulse: 92 95 90   Resp: 18     Temp: 98.3 F (36.8 C) 98.8 F (37.1 C) 97.7 F (36.5 C) 98.3 F (36.8 C)  TempSrc: Axillary Axillary Axillary Axillary  SpO2: 96% 92% 97% 97%  Weight: 58.9 kg     Height: 5' (1.524 m)       Intake/Output  Summary (Last 24 hours) at 04/20/2019 1526 Last data filed at 04/20/2019 1100 Gross per 24 hour  Intake 900 ml  Output 575 ml  Net 325 ml   Filed Weights   04/19/19 2105  Weight: 58.9 kg   Examination: Physical Exam:  Constitutional: WN/WD thin pleasantly demented Caucasian female and NAD and appears slightly agitated Eyes: Lids and conjunctivae normal, sclerae anicteric  ENMT: External Ears, Nose appear normal. Grossly normal hearing. Mucous membranes are moist.  Neck: Appears normal, supple, no cervical masses, normal ROM, no appreciable thyromegaly; no JVD Respiratory: Diminished to auscultation bilaterally, no wheezing, rales, rhonchi or crackles. Normal respiratory effort and patient is not tachypenic. No accessory muscle use.  Cardiovascular: RRR, no murmurs / rubs / gallops. S1 and S2 auscultated. No extremity edema. Abdomen: Soft, non-tender, non-distended. No masses palpated. No appreciable hepatosplenomegaly. Bowel sounds positive x4.  GU: Deferred. Musculoskeletal: No clubbing / cyanosis of digits/nails.  Left hip had a bandage which appears clean dry intact Skin: No rashes, lesions, ulcers on limited  skin evaluation. No induration; Warm and dry.  Neurologic: Did not follow commands and cranial nerves II through XII are grossly intact.  She moves her upper extremities independently.Marland Kitchen  Psychiatric: Impaired judgment and insight. Alert and awake but not oriented.  Anxious mood and appropriate affect.   Data Reviewed: I have personally reviewed following labs and imaging studies  CBC: Recent Labs  Lab 04/18/19 2323 04/19/19 0253 04/20/19 0040 04/20/19 0543  WBC 9.0 7.8 14.5* 12.5*  NEUTROABS 8.1*  --   --   --   HGB 12.4 11.8* 12.3 10.9*  HCT 38.4 36.9 37.6 33.5*  MCV 97.5 97.4 97.7 98.2  PLT 138* 120* 153 454   Basic Metabolic Panel: Recent Labs  Lab 04/18/19 2323 04/19/19 0253 04/20/19 0040 04/20/19 0543  NA 139 137  --  138  K 3.8 3.8  --  3.9  CL 105 105   --  105  CO2 26 27  --  26  GLUCOSE 130* 135*  --  149*  BUN 14 14  --  12  CREATININE 0.87 0.76 0.79 0.96  CALCIUM 9.7 9.2  --  8.9   GFR: Estimated Creatinine Clearance: 38.2 mL/min (by C-G formula based on SCr of 0.96 mg/dL). Liver Function Tests: Recent Labs  Lab 04/19/19 0253 04/20/19 0543  AST 33 30  ALT 21 19  ALKPHOS 113 87  BILITOT 0.8 0.7  PROT 6.1* 5.6*  ALBUMIN 3.1* 2.6*   No results for input(s): LIPASE, AMYLASE in the last 168 hours. No results for input(s): AMMONIA in the last 168 hours. Coagulation Profile: Recent Labs  Lab 04/18/19 2323  INR 1.1   Cardiac Enzymes: No results for input(s): CKTOTAL, CKMB, CKMBINDEX, TROPONINI in the last 168 hours. BNP (last 3 results) No results for input(s): PROBNP in the last 8760 hours. HbA1C: No results for input(s): HGBA1C in the last 72 hours. CBG: No results for input(s): GLUCAP in the last 168 hours. Lipid Profile: No results for input(s): CHOL, HDL, LDLCALC, TRIG, CHOLHDL, LDLDIRECT in the last 72 hours. Thyroid Function Tests: No results for input(s): TSH, T4TOTAL, FREET4, T3FREE, THYROIDAB in the last 72 hours. Anemia Panel: No results for input(s): VITAMINB12, FOLATE, FERRITIN, TIBC, IRON, RETICCTPCT in the last 72 hours. Sepsis Labs: No results for input(s): PROCALCITON, LATICACIDVEN in the last 168 hours.  Recent Results (from the past 240 hour(s))  Urine culture     Status: None   Collection Time: 04/19/19  1:20 AM   Specimen: Urine, Clean Catch  Result Value Ref Range Status   Specimen Description   Final    URINE, CLEAN CATCH Performed at [facility], [address] [place], [place] [address]    Special Requests   Final    NONE Performed at [facility], [address] [place], [place] [address]    Culture   Final    NO GROWTH Performed at [facility], [address], [place], [place] [address]    Report Status 04/20/2019 FINAL  Final  SARS Coronavirus 2 [facility] order,  Performed in [facility] hospital lab) Nasopharyngeal Nasopharyngeal Swab     Status: None   Collection Time: 04/19/19  1:20 AM   Specimen: Nasopharyngeal Swab  Result Value Ref Range Status   SARS Coronavirus 2 NEGATIVE NEGATIVE Final    Comment: (NOTE) If result is NEGATIVE SARS-CoV-2 target nucleic acids are NOT DETECTED. The SARS-CoV-2 RNA is generally detectable in upper and lower  respiratory specimens during the acute phase of  infection. The lowest  concentration of SARS-CoV-2 viral copies this assay can detect is 250  copies / mL. A negative result does not preclude SARS-CoV-2 infection  and should not be used as the sole basis for treatment or other  patient management decisions.  A negative result may occur with  improper specimen collection / handling, submission of specimen other  than nasopharyngeal swab, presence of viral mutation(s) within the  areas targeted by this assay, and inadequate number of viral copies  (<250 copies / mL). A negative result must be combined with clinical  observations, patient history, and epidemiological information. If result is POSITIVE SARS-CoV-2 target nucleic acids are DETECTED. The SARS-CoV-2 RNA is generally detectable in upper and lower  respiratory specimens dur ing the acute phase of infection.  Positive  results are indicative of active infection with SARS-CoV-2.  Clinical  correlation with patient history and other diagnostic information is  necessary to determine patient infection status.  Positive results do  not rule out bacterial infection or co-infection with other viruses. If result is PRESUMPTIVE POSTIVE SARS-CoV-2 nucleic acids MAY BE PRESENT.   A presumptive positive result was obtained on the submitted specimen  and confirmed on repeat testing.  While 2019 novel coronavirus  (SARS-CoV-2) nucleic acids may be present in the submitted sample  additional confirmatory testing may be necessary for epidemiological  and / or  clinical management purposes  to differentiate between  SARS-CoV-2 and other Sarbecovirus currently known to infect humans.  If clinically indicated additional testing with an alternate test  methodology [phone]) is advised. The SARS-CoV-2 RNA is generally  detectable in upper and lower respiratory sp ecimens during the acute  phase of infection. The expected result is Negative. Fact Sheet for Patients:  StrictlyIdeas.no Fact Sheet for Healthcare Providers: BankingDealers.co.za This test is not yet approved or cleared by the [place] FDA and has been authorized for detection and/or diagnosis of SARS-CoV-2 by FDA under an Emergency Use Authorization (EUA).  This EUA will remain in effect (meaning this test can be used) for the duration of the COVID-19 declaration under Section 564(b)(1) of the Act, 21 U.S.C. section 360bbb-3(b)(1), unless the authorization is terminated or revoked sooner. Performed at [facility], [address] [place], [place] [address]     Radiology Studies: Dg Chest 1 View  Result Date: 04/19/2019 CLINICAL DATA:  Preop left hip fracture EXAM: CHEST  1 VIEW COMPARISON:  None. FINDINGS: Mildly hyperexpanded lungs. Normal cardiomediastinal contours. No focal airspace disease. IMPRESSION: No active disease. Electronically Signed   By: Ulyses Jarred M.D.   On: 04/19/2019 00:20   Ct Head Wo Contrast  Result Date: 04/18/2019 CLINICAL DATA:  [age]-year-old female with admit missed fall. Altered mental status. History of dementia. EXAM: CT HEAD WITHOUT CONTRAST CT CERVICAL SPINE WITHOUT CONTRAST TECHNIQUE: Multidetector CT imaging of the head and cervical spine was performed following the standard protocol without intravenous contrast. Multiplanar CT image reconstructions of the cervical spine were also generated. COMPARISON:  Head CT dated 08/01/2018 FINDINGS: CT HEAD FINDINGS Brain: There is moderate age-related atrophy  and chronic microvascular ischemic changes. Bilateral temporal hypodensities likely old infarcts. There is no acute intracranial hemorrhage. No mass effect or midline shift. No extra-axial fluid collection. Vascular: No hyperdense vessel or unexpected calcification. Skull: Normal. Negative for fracture or focal lesion. Sinuses/Orbits: No acute finding. Other: None CT CERVICAL SPINE FINDINGS Alignment: No acute subluxation. Mild reversal of normal cervical lordosis, likely secondary to degenerative changes. Skull base and vertebrae:  No acute fracture. Osteopenia. Soft tissues and spinal canal: No prevertebral fluid or swelling. No visible canal hematoma. Disc levels: Extensive multilevel degenerative changes with endplate irregularity and disc space narrowing. Multilevel facet arthropathy. Upper chest: Negative. Other: None IMPRESSION: 1. No acute intracranial hemorrhage. 2. Age-related atrophy and chronic microvascular ischemic changes. Old bilateral temporal infarcts. 3. No acute/traumatic cervical spine pathology. Extensive degenerative changes. Electronically Signed   By: Anner Crete M.D.   On: 04/18/2019 23:24   Ct Cervical Spine Wo Contrast  Result Date: 04/18/2019 CLINICAL DATA:  [age]-year-old female with admit missed fall. Altered mental status. History of dementia. EXAM: CT HEAD WITHOUT CONTRAST CT CERVICAL SPINE WITHOUT CONTRAST TECHNIQUE: Multidetector CT imaging of the head and cervical spine was performed following the standard protocol without intravenous contrast. Multiplanar CT image reconstructions of the cervical spine were also generated. COMPARISON:  Head CT dated 08/01/2018 FINDINGS: CT HEAD FINDINGS Brain: There is moderate age-related atrophy and chronic microvascular ischemic changes. Bilateral temporal hypodensities likely old infarcts. There is no acute intracranial hemorrhage. No mass effect or midline shift. No extra-axial fluid collection. Vascular: No hyperdense vessel or  unexpected calcification. Skull: Normal. Negative for fracture or focal lesion. Sinuses/Orbits: No acute finding. Other: None CT CERVICAL SPINE FINDINGS Alignment: No acute subluxation. Mild reversal of normal cervical lordosis, likely secondary to degenerative changes. Skull base and vertebrae: No acute fracture. Osteopenia. Soft tissues and spinal canal: No prevertebral fluid or swelling. No visible canal hematoma. Disc levels: Extensive multilevel degenerative changes with endplate irregularity and disc space narrowing. Multilevel facet arthropathy. Upper chest: Negative. Other: None IMPRESSION: 1. No acute intracranial hemorrhage. 2. Age-related atrophy and chronic microvascular ischemic changes. Old bilateral temporal infarcts. 3. No acute/traumatic cervical spine pathology. Extensive degenerative changes. Electronically Signed   By: Anner Crete M.D.   On: 04/18/2019 23:24   Ct Abdomen Pelvis W Contrast  Result Date: 04/19/2019 CLINICAL DATA:  78 year old female with and abdominal trauma. Left hip fracture. EXAM: CT ABDOMEN AND PELVIS WITH CONTRAST TECHNIQUE: Multidetector CT imaging of the abdomen and pelvis was performed using the standard protocol following bolus administration of intravenous contrast. CONTRAST:  169mL OMNIPAQUE IOHEXOL 300 MG/ML  SOLN COMPARISON:  CT of the abdomen pelvis dated 08/29/2018 FINDINGS: Lower chest: Bibasilar dependent atelectatic changes. There is compressive atelectasis of the left lower lobe secondary to a large hiatal hernia. There is mild cardiomegaly. Calcification of the mitral annulus noted. No intra-abdominal free air or free fluid. Hepatobiliary: The liver is unremarkable. No intrahepatic biliary ductal dilatation. No calcified gallstone or pericholecystic fluid. Pancreas: Unremarkable. No pancreatic ductal dilatation or surrounding inflammatory changes. Spleen: Normal in size without focal abnormality. Adrenals/Urinary Tract: The adrenal glands are  unremarkable as visualized. There is a 3 cm left renal upper pole cyst and additional smaller bilateral peripelvic cyst. There is no hydronephrosis on either side. There is symmetric enhancement and excretion of contrast by both kidneys. The visualized ureters and urinary bladder appear unremarkable. Stomach/Bowel: Scattered sigmoid diverticula without active inflammatory changes. There is a large hiatal hernia. Large amount of stool noted throughout the colon. There is no bowel obstruction or active inflammation. The appendix is normal. Vascular/Lymphatic: Mild aortoiliac atherosclerotic disease. The IVC is unremarkable. No portal venous gas. There is no adenopathy. Reproductive: Hysterectomy. No pelvic mass. Other: None Musculoskeletal: Osteopenia with scoliosis and degenerative changes of the spine. There is a displaced fracture of the left femoral neck with mild proximal migration of the femoral shaft. No dislocation. No definite other acute  osseous pathology identified. IMPRESSION: 1. Displaced left femoral neck fracture. 2. Large hiatal hernia. 3. Colonic diverticulosis. No bowel obstruction or active inflammation. Normal appendix. Aortic Atherosclerosis (ICD10-I70.0). Electronically Signed   By: Anner Crete M.D.   On: 04/19/2019 03:16   Pelvis Portable  Result Date: 04/19/2019 CLINICAL DATA:  Femoral head fracture. EXAM: PORTABLE PELVIS 1-2 VIEWS COMPARISON:  April 18, 2019 FINDINGS: The patient has undergone total hip arthroplasty on the left. The alignment appears near anatomic. There are expected postsurgical changes including subcutaneous gas and overlying soft tissue edema. There is no acute displaced fracture. No dislocation. Phleboliths project over the patient's pelvis. IMPRESSION: Status post total hip arthroplasty on the left. Electronically Signed   By: Constance Holster M.D.   On: 04/19/2019 20:48   Dg Hip Unilat With Pelvis 1v Left  Result Date: 04/19/2019 CLINICAL DATA:  Femoral  head fracture EXAM: DG HIP (WITH OR WITHOUT PELVIS) 1V*L* COMPARISON:  April 18, 2019 FINDINGS: The patient has undergone total hip arthroplasty on the left. There are expected postsurgical changes including subcutaneous gas and overlying soft tissue edema. The alignment appears near anatomic. There is no unexpected radiopaque foreign body. IMPRESSION: Status post total hip arthroplasty on the left. Electronically Signed   By: Constance Holster M.D.   On: 04/19/2019 20:49   Dg Hip Unilat With Pelvis 2-3 Views Left  Result Date: 04/19/2019 CLINICAL DATA:  Fall at home today with left hip pain. EXAM: DG HIP (WITH OR WITHOUT PELVIS) 2-3V LEFT COMPARISON:  None. FINDINGS: Displaced left femoral neck fracture. Proximal migration of the femoral shaft. Femoral head remains seated. Pubic rami and remainder of the bony pelvis is intact. Pubic symphysis and sacroiliac joints are congruent. IMPRESSION: Displaced left femoral neck fracture. Electronically Signed   By: Keith Rake M.D.   On: 04/19/2019 00:10   Scheduled Meds:  acetaminophen  500 mg Oral Q8H   [START ON 04/21/2019] clopidogrel  75 mg Oral Daily   docusate sodium  100 mg Oral BID   donepezil  10 mg Oral QHS   DULoxetine  60 mg Oral Daily   enoxaparin (LOVENOX) injection  40 mg Subcutaneous Q24H   loratadine  10 mg Oral Daily   memantine  5 mg Oral BID   pantoprazole  40 mg Oral Daily   polyethylene glycol  17 g Oral Daily   potassium chloride  10 mEq Oral QHS   Continuous Infusions:   LOS: 1 day   Kerney Elbe, DO [facility] PAGER is on AMION  If 7PM-7AM, please contact night-coverage [email or web address] Password [facility] 04/20/2019, 3:26 PM

## 2019-04-20 NOTE — Evaluation (Signed)
Occupational Therapy Evaluation Patient Details Name: [name] MRN: [id number] DOB: 06/06/1941 Today's Date: 04/20/2019    History of Present Illness [name]  is a 78 y.o. female s/p L femore head fx s/p closed reduction w gerd, hypothyroidism, RA,  hypertension, h/o CVA, dementia,  h/o breast cancer, neuropathy.   Clinical Impression   Pt PTA: unknown, assume cared for by family from previous notes. Pt currently limited by poor mobility, strength and increased need for assist form caregivers. Pt modA+2 for bed mobility; maxA+2 for sit to stand and stand pivot transfer to recliner. Pt grimmacing from movement in LLE. Pt requires MaxA to [place] for ADL as pt did not intiate feeding self or assisting with maneuvering covers in bed. O2 90-92% on RA;  >94% on 2L O2.  Pt does require continued OT skilled services for ADL, mobility and safety in SNF setting. OT following acutely.     Follow Up Recommendations  SNF;Supervision/Assistance - 24 hour    Equipment Recommendations  3 in 1 bedside commode    Recommendations for Other Services       Precautions / Restrictions Precautions Precautions: Fall;Posterior Hip;Other (comment)(watch O2) Precaution Booklet Issued: Yes (comment) Precaution Comments: precautions posted; pt with dementia and no family present Restrictions Weight Bearing Restrictions: Yes      Mobility Bed Mobility Overal bed mobility: Needs Assistance Bed Mobility: Sidelying to Sit   Sidelying to sit: Mod assist;+2 for physical assistance;+2 for safety/equipment       General bed mobility comments: assist for trunk elevation and BLE movement  Transfers Overall transfer level: Needs assistance   Transfers: Sit to/from Stand;Stand Pivot Transfers Sit to Stand: Max assist;+2 physical assistance;+2 safety/equipment;From elevated surface Stand pivot transfers: Max assist;+2 physical assistance;+2 safety/equipment;From elevated surface       General  transfer comment: assist for guiding to chair on R side    Balance Overall balance assessment: Needs assistance Sitting-balance support: Bilateral upper extremity supported Sitting balance-Leahy Scale: Poor Sitting balance - Comments: using warm blanket as a harness to keep pt from posterior lean Postural control: Posterior lean Standing balance support: Bilateral upper extremity supported Standing balance-Leahy Scale: Zero Standing balance comment: Pt keeping LLE off of ground for transfer                           ADL either performed or assessed with clinical judgement   ADL Overall ADL's : Needs assistance/impaired Eating/Feeding: Total assistance   Grooming: Maximal assistance;Total assistance   Upper Body Bathing: Maximal assistance   Lower Body Bathing: Total assistance   Upper Body Dressing : Maximal assistance   Lower Body Dressing: Total assistance   Toilet Transfer: Maximal assistance;+2 for physical assistance;+2 for safety/equipment   Toileting- Clothing Manipulation and Hygiene: Maximal assistance;+2 for physical assistance;+2 for safety/equipment       Functional mobility during ADLs: Maximal assistance;+2 for physical assistance;+2 for safety/equipment;Cueing for safety;Cueing for sequencing General ADL Comments: MaxA to [place] as pt did not intiate feeding self or assisting with maneuvering covers in bed     Vision Baseline Vision/History: Wears glasses Wears Glasses: At all times Patient Visual Report: No change from baseline Additional Comments: unable to test as pt not following commands     Perception     Praxis      Pertinent Vitals/Pain Pain Assessment: Faces Faces Pain Scale: Hurts even more Pain Location: L leg Pain Descriptors / Indicators: Discomfort;Grimacing Pain Intervention(s): Monitored during session  Hand Dominance Right   Extremity/Trunk Assessment Upper Extremity Assessment Upper Extremity Assessment:  Generalized weakness   Lower Extremity Assessment Lower Extremity Assessment: Generalized weakness;LLE deficits/detail LLE Deficits / Details: s/p hip sx LLE Coordination: decreased gross motor   Cervical / Trunk Assessment Cervical / Trunk Assessment: Kyphotic   Communication Communication Communication: No difficulties   Cognition Arousal/Alertness: Lethargic Behavior During Therapy: Anxious Overall Cognitive Status: History of cognitive impairments - at baseline                                 General Comments: Pt with dementia following 25% of commands; non communcative throughout session   General Comments  O2 90-92% on RA; with 2L >94%    Exercises     Shoulder Instructions      Home Living Family/patient expects to be discharged to:: Private residence Living Arrangements: Children                               Additional Comments: Pt unable to answer questions and family not present at this time.      Prior Functioning/Environment Level of Independence: Needs assistance        Comments: unknown PLOF.        OT Problem List: Decreased strength;Decreased activity tolerance;Impaired balance (sitting and/or standing);Decreased safety awareness;Pain      OT Treatment/Interventions: Self-care/ADL training;Therapeutic exercise;Neuromuscular education;Energy conservation;Therapeutic activities;Patient/family education;Balance training    OT Goals(Current goals can be found in the care plan section) Acute Rehab OT Goals Patient Stated Goal: none stated OT Goal Formulation: With patient Time For Goal Achievement: 05/04/19 Potential to Achieve Goals: Fair ADL Goals Pt Will Perform Eating: with set-up;sitting Pt Will Perform Grooming: with min guard assist;sitting Pt Will Perform Upper Body Dressing: with min guard assist;sitting Pt Will Perform Lower Body Dressing: with mod assist;sitting/lateral leans;sit to/from stand Pt Will  Transfer to Toilet: with min assist;stand pivot transfer;bedside commode  OT Frequency: Min 2X/week   Barriers to D/C:            Co-evaluation              AM-PAC OT "6 Clicks" Daily Activity     Outcome Measure Help from another person eating meals?: Total Help from another person taking care of personal grooming?: Total Help from another person toileting, which includes using toliet, bedpan, or urinal?: Total Help from another person bathing (including washing, rinsing, drying)?: Total Help from another person to put on and taking off regular upper body clothing?: A Lot Help from another person to put on and taking off regular lower body clothing?: Total 6 Click Score: 7   End of Session Equipment Utilized During Treatment: Gait belt;Left knee immobilizer Nurse Communication: Mobility status  Activity Tolerance: Patient limited by pain Patient left: in chair;with call bell/phone within reach;with chair alarm set  OT Visit Diagnosis: Unsteadiness on feet (R26.81);Muscle weakness (generalized) (M62.81);Pain Pain - Right/Left: Left Pain - part of body: Hip                Time: 6553-7482 OT Time Calculation (min): 24 min Charges:  OT General Charges $OT Visit: 1 Visit OT Evaluation $OT Eval Moderate Complexity: 1 Mod  Darryl Nestle) Marsa Aris OTR/L Acute Rehabilitation Services Pager: [phone] Office: [place] 04/20/2019, 12:27 PM

## 2019-04-20 NOTE — Evaluation (Signed)
Physical Therapy Evaluation Patient Details Name: [name] MRN: [id number] DOB: [birth year] Today's Date: 04/20/2019   History of Present Illness  [name]  is a 78 y.o. female s/p L femore head fx s/p closed reduction w gerd, hypothyroidism, RA,  hypertension, h/o CVA, dementia,  h/o breast cancer, neuropathy.  Clinical Impression   Patient is s/p above surgery resulting in functional limitations due to the deficits listed below (see PT Problem List). Per chart review, pt comes form home, where she has assist; Pt currently limited by poor mobility, strength and increased need for assist form caregivers. Pt modA+2 for bed mobility; maxA+2 for sit to stand and stand pivot transfer to recliner. Pt grimmacing from movement in LLE. Patient will benefit from skilled PT to increase their independence and safety with mobility to allow discharge to the venue listed below.       Follow Up Recommendations SNF    Equipment Recommendations  Rolling walker with 5" wheels;3in1 (PT)    Recommendations for Other Services       Precautions / Restrictions Precautions Precautions: Fall;Posterior Hip;Other (comment)(watch O2) Precaution Booklet Issued: Yes (comment) Precaution Comments: precautions posted; pt with dementia and no family present Restrictions LLE Weight Bearing: Weight bearing as tolerated      Mobility  Bed Mobility Overal bed mobility: Needs Assistance Bed Mobility: Sidelying to Sit   Sidelying to sit: Mod assist;+2 for physical assistance;+2 for safety/equipment       General bed mobility comments: assist for trunk elevation and BLE movement  Transfers Overall transfer level: Needs assistance Equipment used: 1 person hand held assist Transfers: Sit to/from Stand;Stand Pivot Transfers Sit to Stand: Max assist;+2 physical assistance;+2 safety/equipment;From elevated surface Stand pivot transfers: Max assist;+2 physical assistance;+2 safety/equipment;From elevated  surface       General transfer comment: assist for guiding to chair on R side  Ambulation/Gait                Stairs            Wheelchair Mobility    Modified Rankin (Stroke Patients Only)       Balance Overall balance assessment: Needs assistance Sitting-balance support: Bilateral upper extremity supported Sitting balance-Leahy Scale: Poor Sitting balance - Comments: using warm blanket as a harness to keep pt from posterior lean Postural control: Posterior lean Standing balance support: Bilateral upper extremity supported Standing balance-Leahy Scale: Zero Standing balance comment: Pt keeping LLE off of ground for transfer                             Pertinent Vitals/Pain Pain Assessment: Faces Faces Pain Scale: Hurts even more Pain Location: L leg Pain Descriptors / Indicators: Discomfort;Grimacing Pain Intervention(s): Monitored during session    Home Living Family/patient expects to be discharged to:: Private residence Living Arrangements: Children Available Help at Discharge: Family;Available 24 hours/day Type of Home: House Home Access: Stairs to enter Entrance Stairs-Rails: Right;Left;Can reach both Entrance Stairs-Number of Steps: 3 Home Layout: One level Home Equipment: Walker - 2 wheels;Bedside commode;Shower seat;Grab bars - tub/shower;Cane - quad;Cane - single point Additional Comments: Much of above information was gleaned from chart review of 11/19 stay    Prior Function Level of Independence: Needs assistance   Gait / Transfers Assistance Needed: Noted in H&P that she is a household ambulator           Hand Dominance   Dominant Hand: Right    Extremity/Trunk Assessment  Upper Extremity Assessment Upper Extremity Assessment: Defer to OT evaluation    Lower Extremity Assessment Lower Extremity Assessment: LLE deficits/detail LLE Deficits / Details: Grossly decr AROM and strength post fall, fracture, an  dsurgery LLE: Unable to fully assess due to pain    Cervical / Trunk Assessment Cervical / Trunk Assessment: Kyphotic  Communication   Communication: Receptive difficulties;Expressive difficulties  Cognition Arousal/Alertness: Lethargic Behavior During Therapy: Anxious Overall Cognitive Status: History of cognitive impairments - at baseline                                 General Comments: Pt with dementia following 25% of commands; non communcative throughout session      General Comments General comments (skin integrity, edema, etc.): O2 90-92% on RA; with 2L >94%    Exercises     Assessment/Plan    PT Assessment Patient needs continued PT services  PT Problem List Decreased strength;Decreased range of motion;Decreased activity tolerance;Decreased balance;Decreased mobility;Decreased coordination;Decreased cognition;Decreased knowledge of use of DME;Decreased safety awareness;Decreased knowledge of precautions;Cardiopulmonary status limiting activity;Pain       PT Treatment Interventions DME instruction;Gait training;Functional mobility training;Therapeutic activities;Therapeutic exercise;Balance training;Neuromuscular re-education;Cognitive remediation;Patient/family education    PT Goals (Current goals can be found in the Care Plan section)  Acute Rehab PT Goals Patient Stated Goal: none stated PT Goal Formulation: Patient unable to participate in goal setting Time For Goal Achievement: 05/04/19 Potential to Achieve Goals: Fair    Frequency Min 2X/week   Barriers to discharge   Would like to know exactly how much reliable help pt has at home    Co-evaluation PT/OT/SLP Co-Evaluation/Treatment: Yes Reason for Co-Treatment: For patient/therapist safety PT goals addressed during session: Mobility/safety with mobility OT goals addressed during session: ADL's and self-care       AM-PAC PT "6 Clicks" Mobility  Outcome Measure Help needed turning from  your back to your side while in a flat bed without using bedrails?: A Lot Help needed moving from lying on your back to sitting on the side of a flat bed without using bedrails?: A Lot Help needed moving to and from a bed to a chair (including a wheelchair)?: Total Help needed standing up from a chair using your arms (e.g., wheelchair or bedside chair)?: Total Help needed to walk in hospital room?: Total Help needed climbing 3-5 steps with a railing? : Total 6 Click Score: 8    End of Session Equipment Utilized During Treatment: Gait belt Activity Tolerance: Patient tolerated treatment well Patient left: in chair;with call bell/phone within reach Nurse Communication: Mobility status;Other (comment)(need for chair alarm box) PT Visit Diagnosis: Other abnormalities of gait and mobility (R26.89);Pain;History of falling (Z91.81) Pain - Right/Left: Left Pain - part of body: Hip    Time: 6568-1275 PT Time Calculation (min) (ACUTE ONLY): 23 min   Charges:   PT Evaluation $PT Eval Moderate Complexity: [place], [place]  Acute Rehabilitation Services Pager [phone] Office [phone]'   [place] 04/20/2019, 4:32 PM

## 2019-04-20 NOTE — Progress Notes (Signed)
Orthopedic Trauma Service Progress Note  Patient ID: [name] MRN: [id number] DOB/AGE: 10/20/1940 78 y.o.  Subjective:  Daughter at bedside stating patient more confused than normal Not eating that much  Daughter states that Tylenol appears to be not helping but patient appears to be very comfortable.  She is not tachycardic and she is normotensive   Daughter states that over the last 8 months patient has been in a steady decline medically.  That is when she began to have small cerebral infarcts.  She moved in with her mom around that time along with her husband.  Over the last few months has been a pretty rapid medical decline.  Patient uses a walker at baseline and really only ambulates within the house but needs quite a bit of assistance to guide her.  Review of Systems  Unable to perform ROS: Dementia    Objective:   VITALS:   Vitals:   04/19/19 2105 04/19/19 2333 04/20/19 0538 04/20/19 0900  BP: (!) 159/84 (!) 141/88 127/74 125/78  Pulse: 92 95 90   Resp: 18     Temp: 98.3 F (36.8 C) 98.8 F (37.1 C) 97.7 F (36.5 C) 98.3 F (36.8 C)  TempSrc: Axillary Axillary Axillary Axillary  SpO2: 96% 92% 97% 97%  Weight: 58.9 kg     Height: 5' (1.524 m)       Estimated body mass index is 25.36 kg/m as calculated from the following:   Height as of this encounter: 5' (1.524 m).   Weight as of this encounter: 58.9 kg.   Intake/Output      08/15 0701 - 08/16 0700 08/16 0701 - 08/17 0700   P.O. 0 120   I.V. (mL/kg) 977.6 (16.6)    Other 500    IV Piggyback 100    Total Intake(mL/kg) 1577.6 (26.8) 120 (2)   Urine (mL/kg/hr) 375 (0.3)    Blood 200    Total Output 575    Net +1002.6 +120          LABS  Results for orders placed or performed during the hospital encounter of 04/18/19 (from the past 24 hour(s))  CBC     Status: Abnormal   Collection Time: 04/20/19 12:40 AM  Result Value  Ref Range   WBC 14.5 (H) 4.0 - 10.5 K/uL   RBC 3.85 (L) 3.87 - 5.11 MIL/uL   Hemoglobin 12.3 12.0 - 15.0 g/dL   HCT 37.6 36.0 - 46.0 %   MCV 97.7 80.0 - 100.0 fL   MCH 31.9 26.0 - 34.0 pg   MCHC 32.7 30.0 - 36.0 g/dL   RDW 16.9 (H) 11.5 - 15.5 %   Platelets 153 150 - 400 K/uL   nRBC 0.0 0.0 - 0.2 %  Creatinine, serum     Status: None   Collection Time: 04/20/19 12:40 AM  Result Value Ref Range   Creatinine, Ser 0.79 0.44 - 1.00 mg/dL   GFR calc non Af Amer >60 >60 mL/min   GFR calc Af Amer >60 >60 mL/min  CBC     Status: Abnormal   Collection Time: 04/20/19  5:43 AM  Result Value Ref Range   WBC 12.5 (H) 4.0 - 10.5 K/uL   RBC 3.41 (L) 3.87 - 5.11 MIL/uL   Hemoglobin 10.9 (L) 12.0 - 15.0 g/dL  HCT 33.5 (L) 36.0 - 46.0 %   MCV 98.2 80.0 - 100.0 fL   MCH 32.0 26.0 - 34.0 pg   MCHC 32.5 30.0 - 36.0 g/dL   RDW 16.9 (H) 11.5 - 15.5 %   Platelets 162 150 - 400 K/uL   nRBC 0.0 0.0 - 0.2 %  Comprehensive metabolic panel     Status: Abnormal   Collection Time: 04/20/19  5:43 AM  Result Value Ref Range   Sodium 138 135 - 145 mmol/L   Potassium 3.9 3.5 - 5.1 mmol/L   Chloride 105 98 - 111 mmol/L   CO2 26 22 - 32 mmol/L   Glucose, Bld 149 (H) 70 - 99 mg/dL   BUN 12 8 - 23 mg/dL   Creatinine, Ser 0.96 0.44 - 1.00 mg/dL   Calcium 8.9 8.9 - 10.3 mg/dL   Total Protein 5.6 (L) 6.5 - 8.1 g/dL   Albumin 2.6 (L) 3.5 - 5.0 g/dL   AST 30 15 - 41 U/L   ALT 19 0 - 44 U/L   Alkaline Phosphatase 87 38 - 126 U/L   Total Bilirubin 0.7 0.3 - 1.2 mg/dL   GFR calc non Af Amer 56 (L) >60 mL/min   GFR calc Af Amer >60 >60 mL/min   Anion gap 7 5 - 15     PHYSICAL EXAM:   Gen: In bed, not seemingly in any distress.  O2 cannula blowing in her eye.  Did adjust this but she removed it from her nose again.          Sating well without her supplemental O2 on.  Greater than 92% Lungs: Clear anterior fields Cardiac: regular  Abd: soft, NTND, + BS  Ext:       Left lower extremity  Dressing is  intact   Drainage present along the proximal margin of the dressing  Knee immobilizer remains in place.  Patient.  To be tolerating this  Unable to perform motor or sensory function due to baseline dementia.  Patient not participatory  Extremity is warm  + DP pulse  Assessment/Plan: 1 Day Post-Op   Principal Problem:   Femoral neck fracture (HCC) Active Problems:   Rheumatoid arthritis ([place])   Hypothyroidism   Hypertension   Dementia ([place])   History of stroke   Anti-infectives (From admission, onward)   Start     Dose/Rate Route Frequency Ordered Stop   04/19/19 2115  ceFAZolin (ANCEF) IVPB 2g/100 mL premix     2 g 200 mL/hr over 30 Minutes Intravenous Every 6 hours 04/19/19 2111 04/20/19 0552   04/19/19 1644  ceFAZolin (ANCEF) 2-4 GM/100ML-% IVPB    Note to Pharmacy: Ivar Drape   : cabinet override      04/19/19 1644 04/20/19 0459    .  POD/HD#: 76.   78 year old female with complex medical history including stroke, on chronic Plavix, dementia, baseline walker use who sustained a ground-level fall with left femoral neck fracture  -Ground-level fall  -Left femoral neck fracture s/p left hip hemiarthroplasty  Weight-bear as tolerated with assistance    Patient uses a walker at baseline and will require continued use of this  Posterior hip precautions   Continue with knee immobilizer until patient more awake  PT and OT evaluation  Dressing change starting tomorrow  Ice as needed    Discussed with the daughter that this injury could have a significant physiologic effect on her mother..  Patient has very little reserve at baseline and this  injury could result in permanent need to use a wheelchair and even possibly result in death.  1 year mortality rate for fractures is roughly 25% with increasing incidence for chronically ill patients.    This patient may be a good candidate for palliative care consult, but we can see how she does over the next 24 to 48 hours.  Some of  her confusion may be related to continue to effects of anesthesia as well as being out of her normal environment   - Pain management:  Continue with current regimen  Minimize narcotics  Scheduled Tylenol  Norco for breakthrough pain  - ABL anemia/Hemodynamics  Stable  Monitor  CBC in the morning  - Medical issues   Per primary team  Okay to resume Plavix  - DVT/PE prophylaxis:  Lovenox, SCDs, mobilize with therapy   Recommend Lovenox x 30 days postoperatively  - ID:   Perioperative antibiotics complete today  - Metabolic Bone Disease:  Continue Prolia for osteoporosis  Check vitamin D level  - Activity:  Weight-bear as tolerated left leg, posterior hip precautions  Therapy  - FEN/GI prophylaxis/Foley/Lines:  Bedside swallow eval  RD consult  - Impediments to fracture healing:  Osteoporosis  Dementia  General deconditioning  - Dispo:  Continue with therapies  Social work for SNF  Ortho issues are stable    Jari Pigg, PA-C [phone] (C) 04/20/2019, 4:46 PM  [facility] [place] [phone] Dom[place] Sep (F)

## 2019-04-21 ENCOUNTER — Encounter (HOSPITAL_COMMUNITY): Payer: Self-pay | Admitting: Orthopedic Surgery

## 2019-04-21 DIAGNOSIS — D62 Acute posthemorrhagic anemia: Secondary | ICD-10-CM

## 2019-04-21 DIAGNOSIS — D509 Iron deficiency anemia, unspecified: Secondary | ICD-10-CM

## 2019-04-21 DIAGNOSIS — N289 Disorder of kidney and ureter, unspecified: Secondary | ICD-10-CM

## 2019-04-21 DIAGNOSIS — E538 Deficiency of other specified B group vitamins: Secondary | ICD-10-CM

## 2019-04-21 LAB — CBC WITH DIFFERENTIAL/PLATELET
Abs Immature Granulocytes: 0.05 10*3/uL (ref 0.00–0.07)
Basophils Absolute: 0 10*3/uL (ref 0.0–0.1)
Basophils Relative: 1 %
Eosinophils Absolute: 0.1 10*3/uL (ref 0.0–0.5)
Eosinophils Relative: 2 %
HCT: 30 % — ABNORMAL LOW (ref 36.0–46.0)
Hemoglobin: 9.6 g/dL — ABNORMAL LOW (ref 12.0–15.0)
Immature Granulocytes: 1 %
Lymphocytes Relative: 14 %
Lymphs Abs: 1.1 10*3/uL (ref 0.7–4.0)
MCH: 31.7 pg (ref 26.0–34.0)
MCHC: 32 g/dL (ref 30.0–36.0)
MCV: 99 fL (ref 80.0–100.0)
Monocytes Absolute: 0.9 10*3/uL (ref 0.1–1.0)
Monocytes Relative: 11 %
Neutro Abs: 6 10*3/uL (ref 1.7–7.7)
Neutrophils Relative %: 71 %
Platelets: 113 10*3/uL — ABNORMAL LOW (ref 150–400)
RBC: 3.03 MIL/uL — ABNORMAL LOW (ref 3.87–5.11)
RDW: 16.8 % — ABNORMAL HIGH (ref 11.5–15.5)
WBC: 8.2 10*3/uL (ref 4.0–10.5)
nRBC: 0 % (ref 0.0–0.2)

## 2019-04-21 LAB — COMPREHENSIVE METABOLIC PANEL
ALT: 13 U/L (ref 0–44)
AST: 26 U/L (ref 15–41)
Albumin: 2.5 g/dL — ABNORMAL LOW (ref 3.5–5.0)
Alkaline Phosphatase: 82 U/L (ref 38–126)
Anion gap: 7 (ref 5–15)
BUN: 17 mg/dL (ref 8–23)
CO2: 28 mmol/L (ref 22–32)
Calcium: 8.8 mg/dL — ABNORMAL LOW (ref 8.9–10.3)
Chloride: 105 mmol/L (ref 98–111)
Creatinine, Ser: 1.04 mg/dL — ABNORMAL HIGH (ref 0.44–1.00)
GFR calc Af Amer: 59 mL/min — ABNORMAL LOW (ref >60)
GFR calc non Af Amer: 51 mL/min — ABNORMAL LOW (ref >60)
Glucose, Bld: 117 mg/dL — ABNORMAL HIGH (ref 70–99)
Potassium: 4.1 mmol/L (ref 3.5–5.1)
Sodium: 140 mmol/L (ref 135–145)
Total Bilirubin: 0.7 mg/dL (ref 0.3–1.2)
Total Protein: 5.6 g/dL — ABNORMAL LOW (ref 6.5–8.1)

## 2019-04-21 LAB — RETICULOCYTES
Immature Retic Fract: 29.2 % — ABNORMAL HIGH (ref 2.3–15.9)
RBC.: 3.03 MIL/uL — ABNORMAL LOW (ref 3.87–5.11)
Retic Count, Absolute: 46.4 10*3/uL (ref 19.0–186.0)
Retic Ct Pct: 1.5 % (ref 0.4–3.1)

## 2019-04-21 LAB — IRON AND TIBC
Iron: 15 ug/dL — ABNORMAL LOW (ref 28–170)
Saturation Ratios: 5 % — ABNORMAL LOW (ref 10.4–31.8)
TIBC: 298 ug/dL (ref 250–450)
UIBC: 283 ug/dL

## 2019-04-21 LAB — MAGNESIUM: Magnesium: 2 mg/dL (ref 1.7–2.4)

## 2019-04-21 LAB — FERRITIN: Ferritin: 126 ng/mL (ref 11–307)

## 2019-04-21 LAB — FOLATE: Folate: 7.7 ng/mL (ref >5.9)

## 2019-04-21 LAB — PHOSPHORUS: Phosphorus: 2.1 mg/dL — ABNORMAL LOW (ref 2.5–4.6)

## 2019-04-21 LAB — VITAMIN B12: Vitamin B-12: 175 pg/mL — ABNORMAL LOW (ref 180–914)

## 2019-04-21 MED ORDER — ENSURE MAX PROTEIN PO LIQD
11.0000 [oz_av] | Freq: Every day | ORAL | Status: DC
  Administered 2019-04-21: 11 [oz_av] via ORAL
  Filled 2019-04-21 (×3): qty 330

## 2019-04-21 MED ORDER — POLYSACCHARIDE IRON COMPLEX 150 MG PO CAPS
150.0000 mg | ORAL_CAPSULE | Freq: Every day | ORAL | Status: DC
  Administered 2019-04-21 – 2019-04-23 (×3): 150 mg via ORAL
  Filled 2019-04-21 (×3): qty 1

## 2019-04-21 MED ORDER — K PHOS MONO-SOD PHOS DI & MONO 155-852-130 MG PO TABS
500.0000 mg | ORAL_TABLET | Freq: Two times a day (BID) | ORAL | Status: AC
  Administered 2019-04-21 (×2): 500 mg via ORAL
  Filled 2019-04-21 (×2): qty 2

## 2019-04-21 MED ORDER — SODIUM CHLORIDE 0.9 % IV SOLN
INTRAVENOUS | Status: AC
  Administered 2019-04-21: 10:00:00 via INTRAVENOUS

## 2019-04-21 MED ORDER — ADULT MULTIVITAMIN W/MINERALS CH
1.0000 | ORAL_TABLET | Freq: Every day | ORAL | Status: DC
  Administered 2019-04-22: 1 via ORAL
  Filled 2019-04-21 (×2): qty 1

## 2019-04-21 MED ORDER — ACETAMINOPHEN 500 MG PO TABS
500.0000 mg | ORAL_TABLET | Freq: Three times a day (TID) | ORAL | Status: DC
  Administered 2019-04-21 – 2019-04-22 (×4): 500 mg via ORAL
  Filled 2019-04-21 (×6): qty 1

## 2019-04-21 MED ORDER — CYANOCOBALAMIN 1000 MCG/ML IJ SOLN
1000.0000 ug | Freq: Once | INTRAMUSCULAR | Status: AC
  Administered 2019-04-21: 10:00:00 1000 ug via INTRAMUSCULAR
  Filled 2019-04-21: qty 1

## 2019-04-21 MED ORDER — VITAMIN B-12 1000 MCG PO TABS
1000.0000 ug | ORAL_TABLET | Freq: Every day | ORAL | Status: DC
  Filled 2019-04-21 (×2): qty 1

## 2019-04-21 NOTE — Progress Notes (Signed)
Physical Therapy Treatment Patient Details Name: [name] MRN: [id number] DOB: June 09, 1941 Today's Date: 04/21/2019    History of Present Illness [name]  is a 78 y.o. female s/p L femore head fx s/p closed reduction w gerd, hypothyroidism, RA,  hypertension, h/o CVA, dementia,  h/o breast cancer, neuropathy.    PT Comments    Pt performed transfer training with little to no carryover.  She remains lethargic and unable to follow commands for more than 10 % of the time.  Pt continues to benefit from SNF placement for short term rehab before returning home.    Follow Up Recommendations  SNF     Equipment Recommendations  Rolling walker with 5" wheels;3in1 (PT)    Recommendations for Other Services       Precautions / Restrictions Precautions Precautions: Fall;Posterior Hip;Other (comment) Precaution Booklet Issued: Yes (comment) Precaution Comments: precautions posted; pt with dementia and no family present Restrictions Weight Bearing Restrictions: Yes LLE Weight Bearing: Weight bearing as tolerated    Mobility  Bed Mobility Overal bed mobility: Needs Assistance Bed Mobility: Supine to Sit     Supine to sit: Total assist     General bed mobility comments: assist for trunk elevation and BLE movement  Transfers Overall transfer level: Needs assistance Equipment used: 1 person hand held assist Transfers: Sit to/from Stand;Stand Pivot Transfers Sit to Stand: Max assist(Pt in standing leaning back legs against bed and would not weight shift forward.) Stand pivot transfers: Total assist       General transfer comment: Cues for hand placement to push from searted surface.  Required hand over hand guidance to place B hands on RW hand grips. She was unable to progress steps from bed to recliner.  removed device and performed stand pivot with total assistance.  Ambulation/Gait Ambulation/Gait assistance: (NT could not advance steps.)                Stairs             Wheelchair Mobility    Modified Rankin (Stroke Patients Only)       Balance Overall balance assessment: Needs assistance Sitting-balance support: Bilateral upper extremity supported Sitting balance-Leahy Scale: Poor   Postural control: Posterior lean   Standing balance-Leahy Scale: Poor Standing balance comment: leaning against bed for support.                            Cognition Arousal/Alertness: Lethargic Behavior During Therapy: Flat affect Overall Cognitive Status: History of cognitive impairments - at baseline                                 General Comments: Pt with dementia following 25% of commands; non communcative throughout session      Exercises      General Comments        Pertinent Vitals/Pain Pain Assessment: Faces Faces Pain Scale: Hurts little more Pain Location: L leg Pain Descriptors / Indicators: Discomfort;Grimacing Pain Intervention(s): Monitored during session;Repositioned;Ice applied    Home Living                      Prior Function            PT Goals (current goals can now be found in the care plan section) Acute Rehab PT Goals Patient Stated Goal: none stated by patient, daughter would like short  term rehab and then bring her mother back home. Potential to Achieve Goals: Fair Progress towards PT goals: Progressing toward goals    Frequency    Min 3X/week      PT Plan Current plan remains appropriate    Co-evaluation              AM-PAC PT "6 Clicks" Mobility   Outcome Measure  Help needed turning from your back to your side while in a flat bed without using bedrails?: Total Help needed moving from lying on your back to sitting on the side of a flat bed without using bedrails?: Total Help needed moving to and from a bed to a chair (including a wheelchair)?: Total Help needed standing up from a chair using your arms (e.g., wheelchair or bedside  chair)?: Total Help needed to walk in hospital room?: Total Help needed climbing 3-5 steps with a railing? : Total 6 Click Score: 6    End of Session Equipment Utilized During Treatment: Gait belt Activity Tolerance: Patient tolerated treatment well Patient left: in chair;with call bell/phone within reach Nurse Communication: Mobility status;Other (comment) PT Visit Diagnosis: Other abnormalities of gait and mobility (R26.89);Pain;History of falling (Z91.81) Pain - Right/Left: Left Pain - part of body: Hip     Time: 7564-3329 PT Time Calculation (min) (ACUTE ONLY): 28 min  Charges:  $Therapeutic Activity: 23-37 mins                     Governor Rooks, PTA Acute Rehabilitation Services Pager [phone] Office [phone]     Aaron Bostwick Eli Hose 04/21/2019, 5:14 PM

## 2019-04-21 NOTE — Progress Notes (Signed)
PROGRESS NOTE    [name]  DXA:[id number] DOB: [birth year] DOA: 04/18/2019 PCP: Celene Squibb, MD   Brief Narrative:  HPI per Dr. Jani Gravel on 04/19/2019  [name]  is a 78 y.o. female,  w gerd, hypothyroidism, RA,  hypertension, h/o CVA, dementia,  h/o breast cancer, neuropathy, presents with c/o fall at home in living room.  Family heard loud noise and found patient on the floor complaining of left hip/ leg pain. Per pt mechanical fall.   In ED,  T 98.3, P 94 R 20  Bp 157/72  Pox 99% on RA   Xray hip IMPRESSION: Displaced left femoral neck fracture.  CT brain / c spine IMPRESSION: 1. No acute intracranial hemorrhage. 2. Age-related atrophy and chronic microvascular ischemic changes. Old bilateral temporal infarcts. 3. No acute/traumatic cervical spine pathology. Extensive degenerative changes.  CXR IMPRESSION: No active disease.  Wbc 9.0, Hgb 12.4, Plt 138  Na 139, K 3.8, Bun 14, Creatinine 0.87 Trop 5  INR 1.1  Urinalysis pending covid - 19 pending  Pt will be admitted for w/up of left femoral neck fracture  **Interim History Orthopedic surgery evaluated the patient for a hip arthroplasty.  She is postoperative day 2 and she remains significantly demented and unable to provide a subjective history.  She was interacting with me today but she was not making any sense.  Hemoglobin/hematocrit dropped slightly and this is an expected postoperative drop.  Assessment & Plan:   Principal Problem:   Femoral neck fracture (HCC) Active Problems:   Rheumatoid arthritis (HCC)   Hypothyroidism   Hypertension   Dementia (HCC)   History of stroke  Displaced L femoral neck fracture is post left hip arthroplasty by Dr. Lincoln Brigham postoperative day 2 -No longer n.p.o. and her Plavix and Lovenox were held due to operative intervention; resume when okay with orthopedic surgery; she is back on Lovenox but Plavix was still being held but resumed today by Orthopedic  Surgery -Continue pain control with acetaminophen 500 g p.o. every 8 scheduled along with acetaminophen 650 mg p.o./RC every 6 as needed for mild pain or fever greater than 101; continue with hydrocodone-acetaminophen 7.5-325 mg 1 to 2 tablets every 4 hours PRN for severe pain and hydrocodone-acetaminophen 5-325 mg 1 to 2 tablets p.o. every 4 PRN for moderate pain for pain score 4-6 -We will discontinue with Dilaudid 0.5 mg IV every 4 PRN for severe pain as she has IV morphine 0.5-1 mg IV every 2 as needed severe pain for pain score 7-10 uncontrolled with p.o. analgesics -Continue with supportive care and continue with antiemetics with 4 mg p.o./IV every 6 as needed for nausea as well as a bowel regimen with senna 8.6 mg p.o. daily PRN as well as MiraLAX 17 g p.o. daily -Further care per orthopedic surgery -PT OT evaluated and recommending skilled nursing facility -Per report at baseline patient is ambulatory with a walker and occasionally without it -Orthopedic recommending weightbearing left lower extremity posterior hip precautions and recommend continuing use of a walker and continue knee immobilizer until patient more awake -Orthopedics is to change the dressing today and recommending ice as needed -Continue supportive care  H/o CVA -Plavix was held for operative intervention and was resumed today per Ortho -Resume when okay with orthopedics and they will likely think postoperative day 2  Hypertension -Continue to hold Lisinopril 5mg  po qday given Renal Insuffiencey  -As blood pressure is 129/67  Dementia -Continue with donepezil 10 mg p.o. nightly along  with memantine 5 mg p.o. twice daily  Gerd -Cont PPI with pantoprazole 40 mg p.o. daily  RA -Continue to hold Methotrexate for now  Osteoporosis -We will need to continue Prolia as outpatient -Vitamin D level is being checked  Anxiety and Depression -Continue with Duloxetine 60 mg p.o. daily  Leukocytosis, improved   -Patient's leukocytosis likely elevated in the setting of surgery and pain -Patient is WBC went from 7.8 and trended all the way up to 14.5; now is 9.2 -Continue to monitor for signs and symptoms of infection -Repeat CBC in a.m.  Normocytic anemia/Post-Operative Acute Blood Loss Anemia  -Likely drop from surgical intervention -Patient's hemoglobin/hematocrit went from 11.8/36.9 -> 10.9/33.5 -> -Checked Anemia Panel and showed iron level of 15, U IBC of 283, TIBC of 298, saturation ratios of 5%, ferritin level 126, folate level 7.7, and vitamin B12 level 125 -we will start Niferex 150 mg p.o. daily along with giving the patient an IM injection of cyanocobalamin and will start cyanocobalamin thousand micrograms p.o. daily -Continue to monitor for signs and symptoms of bleeding; currently no overt bleeding noted -Repeat CBC in a.m.  Thrombocytopenia, worsened again -Patient's platelet count was 120 on admission and is now trended up to 162 but is now back down to 113 -Continue to monitor for signs and symptoms of bleeding; currently no overt bleeding noted -Repeat CBC in a.m.  Renal Insufficiency/mild AKI -Patient is BUN/creatinine went from 14/0.76 is now 17/1.04 -Continue to hold her lisinopril given her worsening renal function -IV fluid hydration with normal saline rate of 75 mL's per hour for 10 hours -Avoid nephrotoxic medications, contrast dyes as well as hypotension -Continue to monitor and trend renal function and repeat CMP in a.m.  Hyperglycemia -Patient blood sugar has been ranging from 130-149 -Check hemoglobin A1c in the a.m. -Continue to monitor blood sugars carefully and if necessary will need to place on sensitive NovoLog sliding scale insulin AC  Hypophosphatemia -Patient's phosphorus levels morning was 2.1 -Replete with p.o. K-Phos Neutral 500 mg twice daily x2 doses next-continue to monitor replete as necessary -Repeat phosphorus level in the a.m.  DVT  prophylaxis: Enoxaparin 40 mg subcu every 24 Code Status: FULL CODE  Family Communication: No family present at bedside  Disposition Plan: SNF when medically stable  Consultants:  Orthopedic Surgery    Procedures:  Left Arthroplasty Bipolar Hip (Hemiarthorplasty) Done by Dr. Marcelino Scot on 04/19/2019  Antimicrobials:  Anti-infectives (From admission, onward)   Start     Dose/Rate Route Frequency Ordered Stop   04/19/19 2115  ceFAZolin (ANCEF) IVPB 2g/100 mL premix     2 g 200 mL/hr over 30 Minutes Intravenous Every 6 hours 04/19/19 2111 04/20/19 0552   04/19/19 1644  ceFAZolin (ANCEF) 2-4 GM/100ML-% IVPB    Note to Pharmacy: Ivar Drape   : cabinet override      04/19/19 1644 04/20/19 0459     Subjective: Seen and examined at bedside and remains pleasantly demented.  Unable to obtain a subjective history from her due to her current condition and when I asked her questions he answered them but not appropriately.  Still has a leg brace on.  Objective: Vitals:   04/20/19 2016 04/21/19 0500 04/21/19 0538 04/21/19 0802  BP: (!) 113/54  123/70 129/67  Pulse: 89  93 90  Resp:    16  Temp: 98.1 F (36.7 C)  98.9 F (37.2 C) 99.9 F (37.7 C)  TempSrc: Oral  Axillary Oral  SpO2: 96%  99% 94%  Weight:  59.9 kg    Height:        Intake/Output Summary (Last 24 hours) at 04/21/2019 1218 Last data filed at 04/21/2019 0900 Gross per 24 hour  Intake 480 ml  Output 200 ml  Net 280 ml   Filed Weights   04/19/19 2105 04/21/19 0500  Weight: 58.9 kg 59.9 kg   Examination: Physical Exam:  Constitutional: Thin Caucasian pleasantly demented female currently in no acute distress and is resting but when I speak with her does not answer my questions appropriately and does not make sense Eyes: Lids and conjunctive are normal.  Sclera anicteric ENMT: External ears and nose appear normal progress normal hearing.  Extremities are moist Neck: Appears supple no JVD Respiratory: Diminished  auscultation bilaterally with no appreciable wheezing, rales, rhonchi.  Patient not tachypneic wheezing accessory muscle breathe Cardiovascular: Regular rate rhythm.  No appreciable murmurs, rubs or gallops.  No extremity edema noted Abdomen: Soft, nontender, nondistended.  Bowel sounds present GU: Deferred Musculoskeletal: Left leg is immobilized in left hip bandage was not viewed today because of leg brace Skin: Skin is warm and dry no appreciable rashes or lesions on physical evaluation Neurologic: More interactive and cranial nerves II through XII grossly intact but unable to appropriately answer questions Psychiatric: Continues to have impaired judgment insight.  She is awake alert but not oriented.  Slightly anxious today  Data Reviewed: I have personally reviewed following labs and imaging studies  CBC: Recent Labs  Lab 04/18/19 2323 04/19/19 0253 04/20/19 0040 04/20/19 0543 04/21/19 0224  WBC 9.0 7.8 14.5* 12.5* 8.2  NEUTROABS 8.1*  --   --   --  6.0  HGB 12.4 11.8* 12.3 10.9* 9.6*  HCT 38.4 36.9 37.6 33.5* 30.0*  MCV 97.5 97.4 97.7 98.2 99.0  PLT 138* 120* 153 162 048*   Basic Metabolic Panel: Recent Labs  Lab 04/18/19 2323 04/19/19 0253 04/20/19 0040 04/20/19 0543 04/21/19 0224  NA 139 137  --  138 140  K 3.8 3.8  --  3.9 4.1  CL 105 105  --  105 105  CO2 26 27  --  26 28  GLUCOSE 130* 135*  --  149* 117*  BUN 14 14  --  12 17  CREATININE 0.87 0.76 0.79 0.96 1.04*  CALCIUM 9.7 9.2  --  8.9 8.8*  MG  --   --   --   --  2.0  PHOS  --   --   --   --  2.1*   GFR: Estimated Creatinine Clearance: 35.5 mL/min (A) (by C-G formula based on SCr of 1.04 mg/dL (H)). Liver Function Tests: Recent Labs  Lab 04/19/19 0253 04/20/19 0543 04/21/19 0224  AST 33 30 26  ALT 21 19 13   ALKPHOS 113 87 82  BILITOT 0.8 0.7 0.7  PROT 6.1* 5.6* 5.6*  ALBUMIN 3.1* 2.6* 2.5*   No results for input(s): LIPASE, AMYLASE in the last 168 hours. No results for input(s): AMMONIA in  the last 168 hours. Coagulation Profile: Recent Labs  Lab 04/18/19 2323  INR 1.1   Cardiac Enzymes: No results for input(s): CKTOTAL, CKMB, CKMBINDEX, TROPONINI in the last 168 hours. BNP (last 3 results) No results for input(s): PROBNP in the last 8760 hours. HbA1C: No results for input(s): HGBA1C in the last 72 hours. CBG: No results for input(s): GLUCAP in the last 168 hours. Lipid Profile: No results for input(s): CHOL, HDL, LDLCALC, TRIG, CHOLHDL, LDLDIRECT in the last 72 hours.  Thyroid Function Tests: No results for input(s): TSH, T4TOTAL, FREET4, T3FREE, THYROIDAB in the last 72 hours. Anemia Panel: Recent Labs    04/21/19 0224  VITAMINB12 175*  FOLATE 7.7  FERRITIN 126  TIBC 298  IRON 15*  RETICCTPCT 1.5   Sepsis Labs: No results for input(s): PROCALCITON, LATICACIDVEN in the last 168 hours.  Recent Results (from the past 240 hour(s))  Urine culture     Status: None   Collection Time: 04/19/19  1:20 AM   Specimen: Urine, Clean Catch  Result Value Ref Range Status   Specimen Description   Final    URINE, CLEAN CATCH Performed at [facility], [address], [place], [place] [address]    Special Requests   Final    NONE Performed at [facility], [address], [place], [place] [address]    Culture   Final    NO GROWTH Performed at [facility], [place] [address], [place], [place] [address]    Report Status 04/20/2019 FINAL  Final  SARS Coronavirus 2 [facility] order, Performed in [facility] hospital lab) Nasopharyngeal Nasopharyngeal Swab     Status: None   Collection Time: 04/19/19  1:20 AM   Specimen: Nasopharyngeal Swab  Result Value Ref Range Status   SARS Coronavirus 2 NEGATIVE NEGATIVE Final    Comment: (NOTE) If result is NEGATIVE SARS-CoV-2 target nucleic acids are NOT DETECTED. The SARS-CoV-2 RNA is generally detectable in upper and lower  respiratory specimens during the acute phase of infection. The lowest  concentration of SARS-CoV-2  viral copies this assay can detect is 250  copies / mL. A negative result does not preclude SARS-CoV-2 infection  and should not be used as the sole basis for treatment or other  patient management decisions.  A negative result may occur with  improper specimen collection / handling, submission of specimen other  than nasopharyngeal swab, presence of viral mutation(s) within the  areas targeted by this assay, and inadequate number of viral copies  (<250 copies / mL). A negative result must be combined with clinical  observations, patient history, and epidemiological information. If result is POSITIVE SARS-CoV-2 target nucleic acids are DETECTED. The SARS-CoV-2 RNA is generally detectable in upper and lower  respiratory specimens dur ing the acute phase of infection.  Positive  results are indicative of active infection with SARS-CoV-2.  Clinical  correlation with patient history and other diagnostic information is  necessary to determine patient infection status.  Positive results do  not rule out bacterial infection or co-infection with other viruses. If result is PRESUMPTIVE POSTIVE SARS-CoV-2 nucleic acids MAY BE PRESENT.   A presumptive positive result was obtained on the submitted specimen  and confirmed on repeat testing.  While 2019 novel coronavirus  (SARS-CoV-2) nucleic acids may be present in the submitted sample  additional confirmatory testing may be necessary for epidemiological  and / or clinical management purposes  to differentiate between  SARS-CoV-2 and other Sarbecovirus currently known to infect humans.  If clinically indicated additional testing with an alternate test  methodology [phone]) is advised. The SARS-CoV-2 RNA is generally  detectable in upper and lower respiratory sp ecimens during the acute  phase of infection. The expected result is Negative. Fact Sheet for Patients:  StrictlyIdeas.no Fact Sheet for Healthcare Providers:  BankingDealers.co.za This test is not yet approved or cleared by the [place] FDA and has been authorized for detection and/or diagnosis of SARS-CoV-2 by FDA under an Emergency Use Authorization (EUA).  This EUA will remain in effect (meaning this test can be used) for the duration of the COVID-19 declaration under Section 564(b)(1) of the Act, 21 U.S.C. section 360bbb-3(b)(1), unless the authorization is terminated or revoked sooner. Performed at [facility], [address] [place], [place] [address]     Radiology Studies: Pelvis Portable  Result Date: 04/19/2019 CLINICAL DATA:  Femoral head fracture. EXAM: PORTABLE PELVIS 1-2 VIEWS COMPARISON:  April 18, 2019 FINDINGS: The patient has undergone total hip arthroplasty on the left. The alignment appears near anatomic. There are expected postsurgical changes including subcutaneous gas and overlying soft tissue edema. There is no acute displaced fracture. No dislocation. Phleboliths project over the patient's pelvis. IMPRESSION: Status post total hip arthroplasty on the left. Electronically Signed   By: Constance Holster M.D.   On: 04/19/2019 20:48   Dg Hip Unilat With Pelvis 1v Left  Result Date: 04/19/2019 CLINICAL DATA:  Femoral head fracture EXAM: DG HIP (WITH OR WITHOUT PELVIS) 1V*L* COMPARISON:  April 18, 2019 FINDINGS: The patient has undergone total hip arthroplasty on the left. There are expected postsurgical changes including subcutaneous gas and overlying soft tissue edema. The alignment appears near anatomic. There is no unexpected radiopaque foreign body. IMPRESSION: Status post total hip arthroplasty on the left. Electronically Signed   By: Constance Holster M.D.   On: 04/19/2019 20:49   Scheduled Meds: . clopidogrel  75 mg Oral Daily  . docusate sodium  100 mg Oral BID  . donepezil  10 mg Oral QHS  . DULoxetine  60 mg Oral Daily  . enoxaparin (LOVENOX) injection  40 mg Subcutaneous Q24H  . iron  polysaccharides  150 mg Oral Daily  . loratadine  10 mg Oral Daily  . memantine  5 mg Oral BID  . pantoprazole  40 mg Oral Daily  . phosphorus  500 mg Oral BID  . polyethylene glycol  17 g Oral Daily  . potassium chloride  10 mEq Oral QHS  . [START ON 04/22/2019] vitamin B-12  1,000 mcg Oral Daily   Continuous Infusions: . sodium chloride 75 mL/hr at 04/21/19 0936    LOS: 2 days   Kerney Elbe, DO [facility] PAGER is on AMION  If 7PM-7AM, please contact night-coverage [email or web address] Password [facility] 04/21/2019, 12:18 PM

## 2019-04-21 NOTE — Progress Notes (Signed)
Orthopedic Trauma Service Progress Note  Patient ID: [name] MRN: [id number] DOB/AGE: [birth year] 78 y.o.  Subjective:  Daughter states she is a bit better today  Ortho issues stable    Review of Systems  Unable to perform ROS: Dementia     Objective:   VITALS:   Vitals:   04/20/19 2016 04/21/19 0500 04/21/19 0538 04/21/19 0802  BP: (!) 113/54  123/70 129/67  Pulse: 89  93 90  Resp:    16  Temp: 98.1 F (36.7 C)  98.9 F (37.2 C) 99.9 F (37.7 C)  TempSrc: Oral  Axillary Oral  SpO2: 96%  99% 94%  Weight:  59.9 kg    Height:        Estimated body mass index is 25.79 kg/m as calculated from the following:   Height as of this encounter: 5' (1.524 m).   Weight as of this encounter: 59.9 kg.   Intake/Output      08/16 0701 - 08/17 0700 08/17 0701 - 08/18 0700   P.O. 360 120   I.V. (mL/kg)     Other     IV Piggyback     Total Intake(mL/kg) 360 (6) 120 (2)   Urine (mL/kg/hr) 200 (0.1)    Blood     Total Output 200    Net +160 +120        Urine Occurrence 1 x      LABS  Results for orders placed or performed during the hospital encounter of 04/18/19 (from the past 24 hour(s))  Vitamin B12     Status: Abnormal   Collection Time: 04/21/19  2:24 AM  Result Value Ref Range   Vitamin B-12 175 (L) 180 - 914 pg/mL  Folate     Status: None   Collection Time: 04/21/19  2:24 AM  Result Value Ref Range   Folate 7.7 >5.9 ng/mL  Iron and TIBC     Status: Abnormal   Collection Time: 04/21/19  2:24 AM  Result Value Ref Range   Iron 15 (L) 28 - 170 ug/dL   TIBC 298 250 - 450 ug/dL   Saturation Ratios 5 (L) 10.4 - 31.8 %   UIBC 283 ug/dL  Ferritin     Status: None   Collection Time: 04/21/19  2:24 AM  Result Value Ref Range   Ferritin 126 11 - 307 ng/mL  Reticulocytes     Status: Abnormal   Collection Time: 04/21/19  2:24 AM  Result Value Ref Range   Retic Ct Pct 1.5 0.4 - 3.1 %    RBC. 3.03 (L) 3.87 - 5.11 MIL/uL   Retic Count, Absolute 46.4 19.0 - 186.0 K/uL   Immature Retic Fract 29.2 (H) 2.3 - 15.9 %  Magnesium     Status: None   Collection Time: 04/21/19  2:24 AM  Result Value Ref Range   Magnesium 2.0 1.7 - 2.4 mg/dL  Phosphorus     Status: Abnormal   Collection Time: 04/21/19  2:24 AM  Result Value Ref Range   Phosphorus 2.1 (L) 2.5 - 4.6 mg/dL  CBC with Differential/Platelet     Status: Abnormal   Collection Time: 04/21/19  2:24 AM  Result Value Ref Range   WBC 8.2 4.0 - 10.5 K/uL   RBC 3.03 (L) 3.87 - 5.11 MIL/uL  Hemoglobin 9.6 (L) 12.0 - 15.0 g/dL   HCT 30.0 (L) 36.0 - 46.0 %   MCV 99.0 80.0 - 100.0 fL   MCH 31.7 26.0 - 34.0 pg   MCHC 32.0 30.0 - 36.0 g/dL   RDW 16.8 (H) 11.5 - 15.5 %   Platelets 113 (L) 150 - 400 K/uL   nRBC 0.0 0.0 - 0.2 %   Neutrophils Relative % 71 %   Neutro Abs 6.0 1.7 - 7.7 K/uL   Lymphocytes Relative 14 %   Lymphs Abs 1.1 0.7 - 4.0 K/uL   Monocytes Relative 11 %   Monocytes Absolute 0.9 0.1 - 1.0 K/uL   Eosinophils Relative 2 %   Eosinophils Absolute 0.1 0.0 - 0.5 K/uL   Basophils Relative 1 %   Basophils Absolute 0.0 0.0 - 0.1 K/uL   Immature Granulocytes 1 %   Abs Immature Granulocytes 0.05 0.00 - 0.07 K/uL  Comprehensive metabolic panel     Status: Abnormal   Collection Time: 04/21/19  2:24 AM  Result Value Ref Range   Sodium 140 135 - 145 mmol/L   Potassium 4.1 3.5 - 5.1 mmol/L   Chloride 105 98 - 111 mmol/L   CO2 28 22 - 32 mmol/L   Glucose, Bld 117 (H) 70 - 99 mg/dL   BUN 17 8 - 23 mg/dL   Creatinine, Ser 1.04 (H) 0.44 - 1.00 mg/dL   Calcium 8.8 (L) 8.9 - 10.3 mg/dL   Total Protein 5.6 (L) 6.5 - 8.1 g/dL   Albumin 2.5 (L) 3.5 - 5.0 g/dL   AST 26 15 - 41 U/L   ALT 13 0 - 44 U/L   Alkaline Phosphatase 82 38 - 126 U/L   Total Bilirubin 0.7 0.3 - 1.2 mg/dL   GFR calc non Af Amer 51 (L) >60 mL/min   GFR calc Af Amer 59 (L) >60 mL/min   Anion gap 7 5 - 15     PHYSICAL EXAM:   Gen: In bed, no  acute distress  Ext:       Left lower extremity             Dressing is intact                         Drainage present along the proximal margin of the dressing but stable              Knee immobilizer remains in place.  Patient appears To be tolerating this             Unable to perform motor or sensory function due to baseline dementia.  Patient not participatory             Extremity is warm             + DP pulse   Assessment/Plan: 2 Days Post-Op   Principal Problem:   Femoral neck fracture (HCC) Active Problems:   Rheumatoid arthritis (HCC)   Hypothyroidism   Hypertension   Dementia ([place])   History of stroke   Anti-infectives (From admission, onward)   Start     Dose/Rate Route Frequency Ordered Stop   04/19/19 2115  ceFAZolin (ANCEF) IVPB 2g/100 mL premix     2 g 200 mL/hr over 30 Minutes Intravenous Every 6 hours 04/19/19 2111 04/20/19 0552   04/19/19 1644  ceFAZolin (ANCEF) 2-4 GM/100ML-% IVPB    Note to Pharmacy: Ivar Drape   : cabinet override  04/19/19 1644 04/20/19 0459    .  POD/HD#: [age] year old female with complex medical history including stroke, on chronic Plavix, dementia, baseline walker use who sustained a ground-level fall with left femoral neck fracture   -Ground-level fall   -Left femoral neck fracture s/p left hip hemiarthroplasty             Weight-bear as tolerated with assistance                          Patient uses a walker at baseline and will require continued use of this             Posterior hip precautions                         Continue with knee immobilizer until patient more awake             PT and OT evaluation             Dressing changes as needed    Will change before discharge                - Pain management:             Continue with current regimen             Minimize narcotics             Scheduled Tylenol             Norco for breakthrough pain   - ABL anemia/Hemodynamics             Stable              Monitor             CBC in the morning   - Medical issues              Per primary team             plavix resumed    - DVT/PE prophylaxis:             Lovenox, SCDs, mobilize with therapy                         Recommend Lovenox x 30 days postoperatively   - ID:              Perioperative antibiotics completed   - Metabolic Bone Disease:             Continue Prolia for osteoporosis             Check vitamin D level   - Activity:             Weight-bear as tolerated left leg, posterior hip precautions             Therapy   - FEN/GI prophylaxis/Foley/Lines:             Bedside swallow eval             RD consult   - Impediments to fracture healing:             Osteoporosis             Dementia             General deconditioning   - Dispo:             Continue with therapies  Social work for [employer]             Ortho issues are stable    Jari Pigg, PA-C [phone] (C) 04/21/2019, 2:16 PM  [facility] [place] [phone] (F)

## 2019-04-21 NOTE — NC FL2 (Signed)
[place] LEVEL OF CARE SCREENING TOOL     IDENTIFICATION  Patient Name: [name] Birthdate: 11/27/1940 Sex: female Admission Date (Current Location): 04/18/2019  [facility] and [place] Number:  [employer] and Address:  [facility], [place] [address], [place], [place] [address]      Provider Number: [id number]  Attending Physician Name and Address:  Kerney Elbe, DO  Relative Name and Phone Number:  [name], daughter, [phone]    Current Level of Care: Hospital Recommended Level of Care: [place] Prior Approval Number:    Date Approved/Denied:   PASRR Number: [id number]  Discharge Plan: SNF    Current Diagnoses: Patient Active Problem List   Diagnosis Date Noted  . Femoral neck fracture ([place]) 04/19/2019  . History of stroke 04/19/2019  . Cardioembolic stroke ([place]) 57/32/2025  . Acute cardioembolic stroke ([place]) 42/70/6237  . Acute colitis 08/01/2018  . Hypothyroidism 08/01/2018  . Hyponatremia 08/01/2018  . Hypokalemia 08/01/2018  . Infarction of right basal ganglia ([place]) 08/01/2018  . Hypertension 08/01/2018  . Dementia ([employer]) 08/01/2018  . Melena 05/07/2018  . Esophageal dysphagia 05/07/2018  . Osteoporosis with pathological fracture of forearm 10/03/2016  . Lumbar radiculopathy 03/13/2016  . Invasive ductal carcinoma of left breast ([place]) 02/24/2014  . 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 Multiple thyroid nodules 06/03/2013  . Rheumatoid arthritis ([place]) 07/15/2008    Orientation RESPIRATION BLADDER Height & Weight     (disoriented)  Normal Continent, External catheter Weight: 132 lb 0.9 oz (59.9 kg) Height:  5' (152.4 cm)  BEHAVIORAL SYMPTOMS/MOOD NEUROLOGICAL BOWEL NUTRITION STATUS      Continent Diet(see discharge summary)  AMBULATORY STATUS COMMUNICATION OF NEEDS Skin   Extensive Assist Verbally Surgical wounds, Skin abrasions(incision on  left leg with hydrocolloid dressing; right leg with abrasion)                       Personal Care Assistance Level of Assistance  Bathing, Feeding, Dressing Bathing Assistance: Maximum assistance Feeding assistance: Limited assistance Dressing Assistance: Maximum assistance     Functional Limitations Info  Speech, Hearing, Sight Sight Info: Adequate Hearing Info: Adequate Speech Info: Adequate    SPECIAL CARE FACTORS FREQUENCY  OT (By licensed OT), PT (By licensed PT)     PT Frequency: 5x week OT Frequency: 5x week            Contractures Contractures Info: Not present    Additional Factors Info  Code Status, Allergies, Psychotropic Code Status Info: Full Code Allergies Info: Celecoxib, Ibuprofen, Diphenhydramine, Wygesic Psychotropic Info: donepezil (ARICEPT) tablet 10 mg  daily at bedtime; DULoxetine (CYMBALTA) DR capsule 60 mg daily PO; memantine (NAMENDA) tablet 5 mg 2x day PO         Current Medications (04/21/2019):  This is the current hospital active medication list Current Facility-Administered Medications  Medication Dose Route Frequency Provider Last Rate Last Dose  . 0.9 %  sodium chloride infusion   Intravenous Continuous Raiford Noble [place], [place] 75 mL/hr at 04/21/19 6283    . acetaminophen (TYLENOL) tablet 650 mg  650 mg Oral Q6H PRN Ainsley Spinner, PA-C   650 mg at 04/20/19 1517   Or  . acetaminophen (TYLENOL) suppository 650 mg  650 mg Rectal Q6H PRN Ainsley Spinner, PA-C      . acetaminophen (TYLENOL) tablet 325-650 mg  325-650 mg Oral Q6H PRN Ainsley Spinner, PA-C      .  albuterol (PROVENTIL) (2.5 MG/3ML) 0.083% nebulizer solution 2.5 mg  2.5 mg Nebulization Q6H PRN Jani Gravel, MD      . clopidogrel (PLAVIX) tablet 75 mg  75 mg Oral Daily Ainsley Spinner, PA-C   75 mg at 04/21/19 0920  . docusate sodium (COLACE) capsule 100 mg  100 mg Oral BID Ainsley Spinner, PA-C   100 mg at 04/21/19 0919  . donepezil (ARICEPT) tablet 10 mg  10 mg Oral Loma Sousa, MD   10 mg  at 04/20/19 2236  . DULoxetine (CYMBALTA) DR capsule 60 mg  60 mg Oral Daily Jani Gravel, MD   60 mg at 04/21/19 0920  . enoxaparin (LOVENOX) injection 40 mg  40 mg Subcutaneous Q24H Ainsley Spinner, PA-C   40 mg at 04/21/19 4709  . hydrALAZINE (APRESOLINE) injection 10 mg  10 mg Intravenous Q6H PRN Ainsley Spinner, PA-C      . HYDROcodone-acetaminophen (NORCO) 7.5-325 MG per tablet 1-2 tablet  1-2 tablet Oral Q4H PRN Ainsley Spinner, PA-C   1 tablet at 04/21/19 1312  . HYDROcodone-acetaminophen (NORCO/VICODIN) 5-325 MG per tablet 1-2 tablet  1-2 tablet Oral Q4H PRN Ainsley Spinner, PA-C      . iron polysaccharides (NIFEREX) capsule 150 mg  150 mg Oral Daily Raiford Noble [place], DO   150 mg at 04/21/19 0919  . loratadine (CLARITIN) tablet 10 mg  10 mg Oral Daily Jani Gravel, MD   10 mg at 04/21/19 6283  . memantine (NAMENDA) tablet 5 mg  5 mg Oral BID Jani Gravel, MD   5 mg at 04/21/19 0920  . menthol-cetylpyridinium (CEPACOL) lozenge 3 mg  1 lozenge Oral PRN Ainsley Spinner, PA-C       Or  . phenol (CHLORASEPTIC) mouth spray 1 spray  1 spray Mouth/Throat PRN Ainsley Spinner, PA-C      . morphine 2 MG/ML injection 0.5-1 mg  0.5-1 mg Intravenous Q2H PRN Ainsley Spinner, PA-C      . ondansetron [facility]) injection 4 mg  4 mg Intravenous Q6H PRN Ainsley Spinner, PA-C   4 mg at 04/19/19 1301  . ondansetron (ZOFRAN) tablet 4 mg  4 mg Oral Q6H PRN Ainsley Spinner, PA-C       Or  . ondansetron [facility]) injection 4 mg  4 mg Intravenous Q6H PRN Ainsley Spinner, PA-C      . pantoprazole (PROTONIX) EC tablet 40 mg  40 mg Oral Daily Jani Gravel, MD   40 mg at 04/21/19 0920  . phosphorus (K PHOS NEUTRAL) tablet 500 mg  500 mg Oral BID Raiford Noble [place], DO   500 mg at 04/21/19 1312  . polyethylene glycol (MIRALAX / GLYCOLAX) packet 17 g  17 g Oral Daily Jani Gravel, MD   17 g at 04/20/19 1001  . potassium chloride (K-DUR) CR tablet 10 mEq  10 mEq Oral QHS Jani Gravel, MD   10 mEq at 04/20/19 2236  . senna (SENOKOT) tablet 8.6 mg  8.6 mg Oral Daily PRN  Jani Gravel, MD      . Derrill Memo ON 04/22/2019] vitamin B-12 (CYANOCOBALAMIN) tablet 1,000 mcg  1,000 mcg Oral Daily Raiford Noble [place], [place]         Discharge Medications: Please see discharge summary for a list of discharge medications.  Relevant Imaging Results:  Relevant Lab Results:   Additional Information SS# [place], [place]

## 2019-04-21 NOTE — Progress Notes (Signed)
Pt making statements that she's in pain- and not eating.- administered prn narco and was able to get her to complete a magic cup- shortly after she was resting-maintaining o2 sats at 91%-

## 2019-04-21 NOTE — Social Work (Signed)
CSW assisted RNCM with initiating insurance auth with [employer]. Follow for determination.   Westley Hummer, MSW, [place] Work [phone]

## 2019-04-21 NOTE — TOC Initial Note (Signed)
Transition of Care [facility]) - Initial/Assessment Note    Patient Details  Name: [name] MRN: [id number] Date of Birth: 10-03-1940  [facility]) CM/SW Contact:    Samantha Favre, RN Phone Number: 04/21/2019, 4:41 PM  Clinical Narrative:                 Talked to patient and daughter [name] at bedside earlier this morning.    [name] lives with patient. Patient has walker and quad cane at home. [name] would like her mother to go to SNF at discharge to get stronger before returning to home.   [name] works part time in [place] , so her preference would be a SNF in [place] , if none available in [place] than [place] . Faxed to both counties . [name] understands information has been submitted to [facility] for authorization for SNF. Explained to [name] SNF's are not allowing visitors at this time. [name] stated she will ask SNF for "exception" , but realizes SNF may not allow her to visit.    [facility] with bed offers , explained still waiting on insurance authorization. [name] requested offers be emailed to her at [email or web address]. Same done.   Will continue to follow.  Expected Discharge Plan: Skilled Nursing Facility Barriers to Discharge: Continued Medical Work up   Patient Goals and CMS Choice   CMS Medicare.gov Compare Post Acute Care list provided to:: (daughter [phone]) Choice offered to / list presented to : Adult Children  Expected Discharge Plan and Services Expected Discharge Plan: [place]       Living arrangements for the past 2 months: Single Family Home                 DME Arranged: N/A         HH Arranged: NA          Prior Living Arrangements/Services Living arrangements for the past 2 months: Single Family Home Lives with:: Adult Children Patient language and need for interpreter reviewed:: Yes Do you feel safe going back to the place where you live?: Yes      Need for Family  Participation in Patient Care: Yes (Comment) Care giver support system in place?: Yes (comment) Current home services: DME Criminal Activity/Legal Involvement Pertinent to Current Situation/Hospitalization: Yes - Comment as needed  Activities of Daily Living Home Assistive Devices/Equipment: Walker (specify type) ADL Screening (condition at time of admission) Patient's cognitive ability adequate to safely complete daily activities?: No Is the patient deaf or have difficulty hearing?: No Does the patient have difficulty seeing, even when wearing glasses/contacts?: No Does the patient have difficulty concentrating, remembering, or making decisions?: Yes Patient able to express need for assistance with ADLs?: No Does the patient have difficulty dressing or bathing?: Yes Independently performs ADLs?: No Communication: Needs assistance Is this a change from baseline?: Pre-admission baseline Dressing (OT): Needs assistance Is this a change from baseline?: Pre-admission baseline Grooming: Needs assistance Is this a change from baseline?: Pre-admission baseline Feeding: Needs assistance Is this a change from baseline?: Pre-admission baseline Bathing: Needs assistance Is this a change from baseline?: Pre-admission baseline Toileting: Needs assistance Is this a change from baseline?: Pre-admission baseline In/Out Bed: Needs assistance Is this a change from baseline?: Pre-admission baseline Walks in Home: Needs assistance Is this a change from baseline?: Pre-admission baseline Does the patient have difficulty walking or climbing stairs?: Yes Weakness of Legs: Left Weakness of Arms/Hands: None  Permission Sought/Granted Permission sought to share  information with : Family Supports Permission granted to share information with : Yes, Verbal Permission Granted              Emotional Assessment Appearance:: Appears stated age     Orientation: : Fluctuating Orientation (Suspected and/or  reported [place]) Alcohol / Substance Use: Not Applicable Psych Involvement: No (comment)  Admission diagnosis:  Pre-op chest exam [V95.638] Fall, initial encounter [W19.XXXA] Closed left hip fracture, initial encounter [facility]) [S72.002A] Patient Active Problem List   Diagnosis Date Noted  . Femoral neck fracture ([place]) 04/19/2019  . History of stroke 04/19/2019  . Cardioembolic stroke ([place]) 75/64/3329  . Acute cardioembolic stroke ([place]) 51/88/4166  . Acute colitis 08/01/2018  . Hypothyroidism 08/01/2018  . Hyponatremia 08/01/2018  . Hypokalemia 08/01/2018  . Infarction of right basal ganglia ([place]) 08/01/2018  . Hypertension 08/01/2018  . Dementia ([place]) 08/01/2018  . Melena 05/07/2018  . Esophageal dysphagia 05/07/2018  . Osteoporosis with pathological fracture of forearm 10/03/2016  . Lumbar radiculopathy 03/13/2016  . Invasive ductal carcinoma of left breast ([place]) 02/24/2014  . 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 Multiple thyroid nodules 06/03/2013  . Rheumatoid arthritis ([place]) 07/15/2008   PCP:  Samantha Squibb, MD Pharmacy:   [facility], [place] [address] Phone: [phone] Fax: [phone]     Social Determinants of Health (SDOH) Interventions    Readmission Risk Interventions No flowsheet data found.

## 2019-04-21 NOTE — Progress Notes (Signed)
Initial Nutrition Assessment  DOCUMENTATION CODES:   Not applicable  INTERVENTION:   Ensure Max protein supplement BID, each supplement provides 150kcal and 30g of protein.  MVI daily   Magic cup TID with meals, each supplement provides 290 kcal and 9 grams of protein  Dysphagia 3 diet   NUTRITION DIAGNOSIS:   Increased nutrient needs related to hip fracture as evidenced by increased estimated needs.  GOAL:   Patient will meet greater than or equal to 90% of their needs  MONITOR:   PO intake, Supplement acceptance, Labs, Weight trends, Skin, I & O's  REASON FOR ASSESSMENT:   Consult Hip fracture protocol  ASSESSMENT:   78 y.o. female with h/o GERD, hypothyroidism, RA,  hypertension, CVA, dementia,  breast cancer, neuropathy admitted with hip fracture s/p fall now s/p repair 8/15   Met with pt and pt's daughter at bedside today. Pt is unable to provide any meaningful history r/t dementia so history obtained from pt's daughter at bedside. Per pt's daughter, pt has good appetite and oral intake at baseline. Pt lives with her daughter who prepares her meals and takes care of her. Pt does require a mechanical soft diet as she has loose teeth. Daughter is an Therapist, sports; she stays on top of pt's nutrition well. Pt does not drink any supplements at home. Pt does not like milk or super sweet supplements; pt drinks Almond milk at home. Pt does enjoy [employer], dispensing. Pt eating well prior to surgery per pt's daughter; she reports a decline in pt's appetite every since surgery. Pt ate <25% of her lunch today. Daughter would like to try Ensure Max for patient as this is not sweet. RD will also add Magic Cups to meal trays. Daughter reports that pt has had a cognitive decline over the past few months and her taste has changed so she may accept the supplements; she would like to try.   Per chart, pt appears weight stable pta.   Medications reviewed and include: plavix, colace, lovenox, iron  polysaccharides, protonix, KPhos, miralax, KCl, B12, NaCl '@75ml' /hr  Labs reviewed: K 4.1 wnl, P 2.1(L), Mg 2.0(L) Iron 15(L), TIBC 298, ferritin 126, Folate 7.7, B12 175(L)- 8/17 Hgb 9.6(L), Hct 30.0(L)  NUTRITION - FOCUSED PHYSICAL EXAM:    Most Recent Value  Orbital Region  Mild depletion  Upper Arm Region  No depletion  Thoracic and Lumbar Region  Mild depletion  Buccal Region  Mild depletion  Temple Region  Mild depletion  Clavicle Bone Region  Mild depletion  Clavicle and Acromion Bone Region  Mild depletion  Scapular Bone Region  Unable to assess  Dorsal Hand  Severe depletion  Patellar Region  Mild depletion  Anterior Thigh Region  Mild depletion  Posterior Calf Region  Moderate depletion  Edema (RD Assessment)  None  Hair  Reviewed  Eyes  Reviewed  Mouth  Reviewed  Skin  Reviewed  Nails  Reviewed     Diet Order:   Diet Order            Diet regular Room service appropriate? Yes; Fluid consistency: Thin  Diet effective now             EDUCATION NEEDS:   Education needs have been addressed(with patient's daughter)  Skin:  Skin Assessment: Reviewed RN Assessment(Incision L leg)  Last BM:  8/14  Height:   Ht Readings from Last 1 Encounters:  04/19/19 5' (1.524 m)    Weight:   Wt Readings from Last 1 Encounters:  04/21/19 59.9 kg    Ideal Body Weight:  45.4 kg  BMI:  Body mass index is 25.79 kg/m.  Estimated Nutritional Needs:   Kcal:  1300-1500kcal/day  Protein:  65-75g/day  Fluid:  >1.1L/day  Koleen Distance MS, RD, LDN Pager #- [phone] Office#- [phone] After Hours Pager: [phone]

## 2019-04-22 LAB — COMPREHENSIVE METABOLIC PANEL
ALT: 10 U/L (ref 0–44)
AST: 22 U/L (ref 15–41)
Albumin: 2.2 g/dL — ABNORMAL LOW (ref 3.5–5.0)
Alkaline Phosphatase: 76 U/L (ref 38–126)
Anion gap: 6 (ref 5–15)
BUN: 14 mg/dL (ref 8–23)
CO2: 29 mmol/L (ref 22–32)
Calcium: 8.6 mg/dL — ABNORMAL LOW (ref 8.9–10.3)
Chloride: 107 mmol/L (ref 98–111)
Creatinine, Ser: 0.78 mg/dL (ref 0.44–1.00)
GFR calc Af Amer: >60 mL/min (ref >60)
GFR calc non Af Amer: >60 mL/min (ref >60)
Glucose, Bld: 100 mg/dL — ABNORMAL HIGH (ref 70–99)
Potassium: 3.8 mmol/L (ref 3.5–5.1)
Sodium: 142 mmol/L (ref 135–145)
Total Bilirubin: 0.7 mg/dL (ref 0.3–1.2)
Total Protein: 5.2 g/dL — ABNORMAL LOW (ref 6.5–8.1)

## 2019-04-22 LAB — CBC
HCT: 27.3 % — ABNORMAL LOW (ref 36.0–46.0)
Hemoglobin: 8.8 g/dL — ABNORMAL LOW (ref 12.0–15.0)
MCH: 32 pg (ref 26.0–34.0)
MCHC: 32.2 g/dL (ref 30.0–36.0)
MCV: 99.3 fL (ref 80.0–100.0)
Platelets: 93 10*3/uL — ABNORMAL LOW (ref 150–400)
RBC: 2.75 MIL/uL — ABNORMAL LOW (ref 3.87–5.11)
RDW: 17.1 % — ABNORMAL HIGH (ref 11.5–15.5)
WBC: 6 10*3/uL (ref 4.0–10.5)
nRBC: 0 % (ref 0.0–0.2)

## 2019-04-22 LAB — PHOSPHORUS: Phosphorus: 2.5 mg/dL (ref 2.5–4.6)

## 2019-04-22 LAB — MAGNESIUM: Magnesium: 1.9 mg/dL (ref 1.7–2.4)

## 2019-04-22 LAB — VITAMIN D 25 HYDROXY (VIT D DEFICIENCY, FRACTURES): Vit D, 25-Hydroxy: 49.7 ng/mL (ref 30.0–100.0)

## 2019-04-22 LAB — CALCITRIOL (1,25 DI-OH VIT D): Vit D, 1,25-Dihydroxy: 28.7 pg/mL (ref 19.9–79.3)

## 2019-04-22 NOTE — TOC Progression Note (Signed)
Transition of Care [facility]) - Progression Note    Patient Details  Name: [name] MRN: [id number] Date of Birth: 08-06-1941  [facility]) CM/SW Contact  Beuna Bolding, Edson Snowball, RN Phone Number: 04/22/2019, 2:29 PM  Clinical Narrative:     Called daughter [name] to inform her of insurance denied for SNF. No answer and voicemail has not been set up. Emailed [name] asking her to call back. Await call.  Expected Discharge Plan: [place] Barriers to Discharge: Continued Medical Work up  Expected Discharge Plan and Services Expected Discharge Plan: [place]       Living arrangements for the past 2 months: Single Family Home                 DME Arranged: N/A         HH Arranged: NA           Social Determinants of Health (SDOH) Interventions    Readmission Risk Interventions No flowsheet data found.

## 2019-04-22 NOTE — Social Work (Addendum)
1:59pm- Spoke with attending MD; he is in agreement with Dr. Amalia Hailey decision. Provided this update to Deb with [employer], a denial letter will be faxed to [facility] at [phone].  1:49pm- CSW received a call from [place] with [employer].  They are giving a preliminary denial from their medical director "due to pt cognitive status he does not believe SNF placement is appropriate at this time."   CSW messaged Dr. Alfredia Ferguson and offered peer to peer with Dr. Lynder Parents at [phone] by the end of the day.  Westley Hummer, MSW, [place] Work [phone]

## 2019-04-22 NOTE — Plan of Care (Signed)
  Problem: Clinical Measurements: Goal: Will remain free from infection Outcome: Progressing   Problem: Clinical Measurements: Goal: Diagnostic test results will improve Outcome: Progressing   Problem: Clinical Measurements: Goal: Respiratory complications will improve Outcome: Progressing   

## 2019-04-22 NOTE — Plan of Care (Signed)
  Problem: Safety: Goal: Ability to remain free from injury will improve Outcome: Progressing   Problem: Elimination: Goal: Will not experience complications related to bowel motility Outcome: Progressing

## 2019-04-22 NOTE — TOC Progression Note (Signed)
Transition of Care [facility]) - Progression Note    Patient Details  Name: [name] MRN: [id number] Date of Birth: 1940-12-07  [facility] CM/SW Contact  Jacalyn Lefevre Edson Snowball, RN Phone Number: 04/22/2019, 1:27 PM  Clinical Narrative:     Waiting to hear back on insurance authorization for SNF.   Confirmed patient's daughter [name] receive bed offers. She is researching SNF's and has not made a decision.   Expected Discharge Plan: [place] Barriers to Discharge: Continued Medical Work up  Expected Discharge Plan and Services Expected Discharge Plan: [place]       Living arrangements for the past 2 months: Single Family Home                 DME Arranged: N/A         HH Arranged: NA           Social Determinants of Health (SDOH) Interventions    Readmission Risk Interventions No flowsheet data found.

## 2019-04-22 NOTE — Care Management Important Message (Signed)
Important Message  Patient Details  Name: [name] MRN: [id number] Date of Birth: 1941/09/04   Medicare Important Message Given:    Due to illness patient was not able to signed.  Signed copy left at patient bedside.   Gillie Fleites 04/22/2019, 3:43 PM

## 2019-04-22 NOTE — Progress Notes (Signed)
Orthopedic Trauma Service Progress Note  Patient ID: [name] MRN: [id number] DOB/AGE: [birth year] 78 y.o.  Subjective:  More awake today Communicates with me today albeit minimal  Sitting up in bed drinking ensure  Had a bath not too long ago   Minimal narcotic use  Continues on scheduled tylenol    Review of Systems  Unable to perform ROS: Dementia    Objective:   VITALS:   Vitals:   04/21/19 0538 04/21/19 0802 04/21/19 2004 04/22/19 0546  BP: 123/70 129/67 (!) 104/49 136/62  Pulse: 93 90 81 87  Resp:  16 15   Temp: 98.9 F (37.2 C) 99.9 F (37.7 C) 98.3 F (36.8 C) 99 F (37.2 C)  TempSrc: Axillary Oral Oral Oral  SpO2: 99% 94% 97% 95%  Weight:      Height:        Estimated body mass index is 25.79 kg/m as calculated from the following:   Height as of this encounter: 5' (1.524 m).   Weight as of this encounter: 59.9 kg.   Intake/Output      08/17 0701 - 08/18 0700 08/18 0701 - 08/19 0700   P.O. 205    Total Intake(mL/kg) 205 (3.4)    Urine (mL/kg/hr) 600 (0.4)    Total Output 600    Net -395           LABS  Results for orders placed or performed during the hospital encounter of 04/18/19 (from the past 24 hour(s))  CBC     Status: Abnormal   Collection Time: 04/22/19  6:34 AM  Result Value Ref Range   WBC 6.0 4.0 - 10.5 K/uL   RBC 2.75 (L) 3.87 - 5.11 MIL/uL   Hemoglobin 8.8 (L) 12.0 - 15.0 g/dL   HCT 27.3 (L) 36.0 - 46.0 %   MCV 99.3 80.0 - 100.0 fL   MCH 32.0 26.0 - 34.0 pg   MCHC 32.2 30.0 - 36.0 g/dL   RDW 17.1 (H) 11.5 - 15.5 %   Platelets 93 (L) 150 - 400 K/uL   nRBC 0.0 0.0 - 0.2 %  Comprehensive metabolic panel     Status: Abnormal   Collection Time: 04/22/19  6:34 AM  Result Value Ref Range   Sodium 142 135 - 145 mmol/L   Potassium 3.8 3.5 - 5.1 mmol/L   Chloride 107 98 - 111 mmol/L   CO2 29 22 - 32 mmol/L   Glucose, Bld 100 (H) 70 - 99 mg/dL   BUN  14 8 - 23 mg/dL   Creatinine, Ser 0.78 0.44 - 1.00 mg/dL   Calcium 8.6 (L) 8.9 - 10.3 mg/dL   Total Protein 5.2 (L) 6.5 - 8.1 g/dL   Albumin 2.2 (L) 3.5 - 5.0 g/dL   AST 22 15 - 41 U/L   ALT 10 0 - 44 U/L   Alkaline Phosphatase 76 38 - 126 U/L   Total Bilirubin 0.7 0.3 - 1.2 mg/dL   GFR calc non Af Amer >60 >60 mL/min   GFR calc Af Amer >60 >60 mL/min   Anion gap 6 5 - 15  Magnesium     Status: None   Collection Time: 04/22/19  6:34 AM  Result Value Ref Range   Magnesium 1.9 1.7 - 2.4 mg/dL  Phosphorus  Status: None   Collection Time: 04/22/19  6:34 AM  Result Value Ref Range   Phosphorus 2.5 2.5 - 4.6 mg/dL     PHYSICAL EXAM:    Gen: in bed, NAD, drinking ensure, sitting upright  Ext:       Left Lower Extremity   Dressing has already been changed today  Dressing is clean   Pt moves toes and ankle on command   Ext warm   + DP pulse  No significant swelling      Assessment/Plan: 3 Days Post-Op   Principal Problem:   Femoral neck fracture ([place]) Active Problems:   Rheumatoid arthritis ([place])   Hypothyroidism   Hypertension   Dementia ([place])   History of stroke   Anti-infectives (From admission, onward)   Start     Dose/Rate Route Frequency Ordered Stop   04/19/19 2115  ceFAZolin (ANCEF) IVPB 2g/100 mL premix     2 g 200 mL/hr over 30 Minutes Intravenous Every 6 hours 04/19/19 2111 04/20/19 0552   04/19/19 1644  ceFAZolin (ANCEF) 2-4 GM/100ML-% IVPB    Note to Pharmacy: Ivar Drape   : cabinet override      04/19/19 1644 04/20/19 0459    .  POD/HD#: 69  78 year old female with complex medical history including stroke, on chronic Plavix, dementia, baseline walker use who sustained a ground-level fall with left femoral neck fracture   -Ground-level fall   -Left femoral neck fracture s/p left hip hemiarthroplasty             Weight-bear as tolerated with assistance                          Patient uses a walker at baseline and will require continued  use of this             Posterior hip precautions                         Continue with knee immobilizer until patient more awake             PT and OT evaluation             Dressing changes as needed                                        - Pain management:             Continue with current regimen             Minimize narcotics             Scheduled Tylenol             Norco for breakthrough pain   - ABL anemia/Hemodynamics             Stable              - Medical issues              Per primary team             plavix resumed    - DVT/PE prophylaxis:             Lovenox, SCDs, mobilize with therapy  Recommend Lovenox x 30 days postoperatively   - ID:              Perioperative antibiotics completed   - Metabolic Bone Disease/osteoporosis:             Continue Prolia for osteoporosis             vitamin d looks good   - Activity:             Weight-bear as tolerated left leg, posterior hip precautions             Therapy   - FEN/GI prophylaxis/Foley/Lines:             Bedside swallow eval             RD consulted   Appreciate recs    - Impediments to fracture healing:             Osteoporosis             Dementia             General deconditioning   - Dispo:             Continue with therapies             ortho issues stable  Looks like SNF denied due to cognitive deficits    Jari Pigg, PA-C [phone] (C) 04/22/2019, 2:26 PM  [facility] [place] [id number] [phone] Domingo Sep (F)

## 2019-04-22 NOTE — Progress Notes (Signed)
PROGRESS NOTE    [name]  NLZ:[id number] DOB: November 10, 1940 DOA: 04/18/2019 PCP: Celene Squibb, MD   Brief Narrative:  HPI per Dr. Jani Gravel on 04/19/2019  [name]  is a 78 y.o. female,  w gerd, hypothyroidism, RA,  hypertension, h/o CVA, dementia,  h/o breast cancer, neuropathy, presents with c/o fall at home in living room.  Family heard loud noise and found patient on the floor complaining of left hip/ leg pain. Per pt mechanical fall.   In ED,  T 98.3, P 94 R 20  Bp 157/72  Pox 99% on RA   Xray hip IMPRESSION: Displaced left femoral neck fracture.  CT brain / c spine IMPRESSION: 1. No acute intracranial hemorrhage. 2. Age-related atrophy and chronic microvascular ischemic changes. Old bilateral temporal infarcts. 3. No acute/traumatic cervical spine pathology. Extensive degenerative changes.  CXR IMPRESSION: No active disease.  Wbc 9.0, Hgb 12.4, Plt 138  Na 139, K 3.8, Bun 14, Creatinine 0.87 Trop 5  INR 1.1  Urinalysis pending covid - 19 pending  Pt will be admitted for w/up of left femoral neck fracture  **Interim History Orthopedic surgery evaluated the patient for a hip arthroplasty.  She is postoperative day 3 and she remains significantly demented and unable to provide a subjective history.  Nursing she is been very confused and refusing her vital signs to refuse to eat or allow staff to assist with her ADLs.  She was interacting with me today and was more awake but did not make any more sense again.  Hemoglobin/hematocrit continues to drop slightly further will need to monitor carefully.  Was denied for SNF and I feel that she will not be able to participate with therapy given her current condition and given her advanced dementia.  Assessment & Plan:   Principal Problem:   Femoral neck fracture ([facility]) Active Problems:   Rheumatoid arthritis ([facility])   Hypothyroidism   Hypertension   Dementia ([facility])   History of stroke  Displaced L femoral  neck fracture is post left hip arthroplasty by Dr. Lincoln Brigham postoperative day 3 -No longer n.p.o. and her Plavix and Lovenox were held due to operative intervention; resume when okay with orthopedic surgery; she is back on Lovenox and Plavix were resumed yesterday -Continue pain control with acetaminophen 500 g p.o. every 8 scheduled along with acetaminophen 650 mg p.o./RC every 6 as needed for mild pain or fever greater than 101; continue with hydrocodone-acetaminophen 7.5-325 mg 1 to 2 tablets every 4 hours PRN for severe pain and hydrocodone-acetaminophen 5-325 mg 1 to 2 tablets p.o. every 4 PRN for moderate pain for pain score 4-6 -We will discontinue with Dilaudid 0.5 mg IV every 4 PRN for severe pain as she has IV morphine 0.5-1 mg IV every 2 as needed severe pain for pain score 7-10 uncontrolled with p.o. analgesics -Continue with supportive care and continue with antiemetics with 4 mg p.o./IV every 6 as needed for nausea as well as a bowel regimen with senna 8.6 mg p.o. daily PRN as well as MiraLAX 17 g p.o. daily -Further care per orthopedic surgery -PT OT evaluated and recommending skilled nursing facility however patient was denied for skilled nursing facility because of her current cognitive deficits and I feel she will not be able to participate with therapy given her continued severe dementia and refusal to eat, allow staff to assist with ADLs or even to allow [place] to get vital signs -Per report at baseline patient is ambulatory with a  walker and occasionally without it -Orthopedic recommending weightbearing left lower extremity posterior hip precautions and recommend continuing use of a walker and continue knee immobilizer until patient more awake -Orthopedics is to change the dressing today and recommending ice as needed -Continue supportive care  H/o CVA -Plavix was held for operative intervention and was resumed today per Ortho  Hypertension -Continue to hold Lisinopril 5mg  po qday  given Renal Insuffiencey and because of lower blood pressure -As blood pressure is now 136/62  Dementia -Continue with donepezil 10 mg p.o. nightly along with memantine 5 mg p.o. twice daily  Gerd -Cont PPI with pantoprazole 40 mg p.o. daily  RA -Continue to hold Methotrexate for now  Osteoporosis -We will need to continue Prolia as outpatient -Vitamin D level is being checked and vitamin D 1,25 D hydroxy was 28.7, vitamin D 25 hydroxy was 49.7  Anxiety and Depression -Continue with Duloxetine 60 mg p.o. daily  Leukocytosis, improved  -Patient's leukocytosis likely elevated in the setting of surgery and pain -Patient is WBC went from 7.8 and trended all the way up to 14.5; now is 6.0 -Continue to monitor for signs and symptoms of infection -Repeat CBC in a.m.  Normocytic anemia/Post-Operative Acute Blood Loss Anemia  -Likely drop from surgical intervention and question dilutional drop -Patient's hemoglobin/hematocrit went from 11.8/36.9 -> 10.9/33.5 -> 9.6/30.0 -> 8.8/27.3 -Checked Anemia Panel and showed iron level of 15, U IBC of 283, TIBC of 298, saturation ratios of 5%, ferritin level 126, folate level 7.7, and vitamin B12 level 125 -we will start Niferex 150 mg p.o. daily along with giving the patient an IM injection of cyanocobalamin and will start cyanocobalamin thousand micrograms p.o. daily -Continue to monitor for signs and symptoms of bleeding; currently no overt bleeding noted -Repeat CBC in a.m.  Thrombocytopenia, worsened again -Patient's platelet count was 120 on admission and is now trended up to 162 but is now trending back down is now 93 -Continue to monitor for signs and symptoms of bleeding; currently no overt bleeding noted -Repeat CBC in a.m.  Renal Insufficiency/mild AKI improved -Patient is BUN/creatinine went from 14/0.76 -> 17/1.04; patient's BUN/creatinine is now 14/0.78 -Continue to hold her lisinopril given her worsening renal function -IV  fluid hydration with normal saline rate of 75 mL's per hour for 10 hours -Avoid nephrotoxic medications, contrast dyes as well as hypotension -Continue to monitor and trend renal function and repeat CMP in a.m.  Hyperglycemia -Patient blood sugar has been ranging from 130-149 -Check hemoglobin A1c in the a.m. -Continue to monitor blood sugars carefully and if necessary will need to place on sensitive NovoLog sliding scale insulin AC  Hypophosphatemia -Patient's phosphorus levels morning was 2.5 -Replete with p.o. K-Phos Neutral 500 mg twice daily x2 doses next-continue to monitor replete as necessary -Repeat phosphorus level in the a.m.  DVT prophylaxis: Enoxaparin 40 mg subcu every 24 Code Status: FULL CODE  Family Communication: No family present at bedside  Disposition Plan: Unfortunately SNF got denied due to her cognitive and she will either need home health versus a private pay SNF  Consultants:  Orthopedic Surgery    Procedures:  Left Arthroplasty Bipolar Hip (Hemiarthorplasty) Done by Dr. Marcelino Scot on 04/19/2019  Antimicrobials:  Anti-infectives (From admission, onward)   Start     Dose/Rate Route Frequency Ordered Stop   04/19/19 2115  ceFAZolin (ANCEF) IVPB 2g/100 mL premix     2 g 200 mL/hr over 30 Minutes Intravenous Every 6 hours 04/19/19 2111 04/20/19 0552  04/19/19 1644  ceFAZolin (ANCEF) 2-4 GM/100ML-% IVPB    Note to Pharmacy: Ivar Drape   : cabinet override      04/19/19 1644 04/20/19 0459     Subjective: Seen and examined at bedside   Objective: Vitals:   04/21/19 0538 04/21/19 0802 04/21/19 2004 04/22/19 0546  BP: 123/70 129/67 (!) 104/49 136/62  Pulse: 93 90 81 87  Resp:  16 15   Temp: 98.9 F (37.2 C) 99.9 F (37.7 C) 98.3 F (36.8 C) 99 F (37.2 C)  TempSrc: Axillary Oral Oral Oral  SpO2: 99% 94% 97% 95%  Weight:      Height:        Intake/Output Summary (Last 24 hours) at 04/22/2019 1802 Last data filed at 04/22/2019 1500 Gross per 24  hour  Intake 120 ml  Output 600 ml  Net -480 ml   Filed Weights   04/19/19 2105 04/21/19 0500  Weight: 58.9 kg 59.9 kg   Examination: Physical Exam:  Constitutional: Pleasantly demented female currently appears calm but earlier was combative with the staff and refused vital signs and refusing to eat Eyes: Lids and conjunctive are normal.  Sclera icteric ENMT: External ears nose appear normal.  Grossly normal hearing.  Neck: Appears supple no JVD Respiratory: Slightly diminished auscultation bilaterally no appreciable wheezing, rales, rhonchi.  Patient not tachypneic or using any accessory muscles are moist Cardiovascular: Regular rate and rhythm.  No appreciable murmurs, rubs, gallops.  No extremity edema noted Abdomen: Soft, nontender, nondistended but sounds present GU: Deferred Musculoskeletal: Left leg is immobilized in a leg brace Skin: Skin is warm and dry no appreciable rashes or lesions on to skin evaluation Neurologic: Cranial nerves II through XII grossly; Romberg sign cerebellar reflexes were not Psychiatric: Continues to be impaired judgment insight.  She is awake and more alert today but remains anxious   Data Reviewed: I have personally reviewed following labs and imaging studies  CBC: Recent Labs  Lab 04/18/19 2323 04/19/19 0253 04/20/19 0040 04/20/19 0543 04/21/19 0224 04/22/19 0634  WBC 9.0 7.8 14.5* 12.5* 8.2 6.0  NEUTROABS 8.1*  --   --   --  6.0  --   HGB 12.4 11.8* 12.3 10.9* 9.6* 8.8*  HCT 38.4 36.9 37.6 33.5* 30.0* 27.3*  MCV 97.5 97.4 97.7 98.2 99.0 99.3  PLT 138* 120* 153 162 113* 93*   Basic Metabolic Panel: Recent Labs  Lab 04/18/19 2323 04/19/19 0253 04/20/19 0040 04/20/19 0543 04/21/19 0224 04/22/19 0634  NA 139 137  --  138 140 142  K 3.8 3.8  --  3.9 4.1 3.8  CL 105 105  --  105 105 107  CO2 26 27  --  26 28 29   GLUCOSE 130* 135*  --  149* 117* 100*  BUN 14 14  --  12 17 14   CREATININE 0.87 0.76 0.79 0.96 1.04* 0.78  CALCIUM  9.7 9.2  --  8.9 8.8* 8.6*  MG  --   --   --   --  2.0 1.9  PHOS  --   --   --   --  2.1* 2.5   GFR: Estimated Creatinine Clearance: 46.2 mL/min (by C-G formula based on SCr of 0.78 mg/dL). Liver Function Tests: Recent Labs  Lab 04/19/19 0253 04/20/19 0543 04/21/19 0224 04/22/19 0634  AST 33 30 26 22   ALT 21 19 13 10   ALKPHOS 113 87 82 76  BILITOT 0.8 0.7 0.7 0.7  PROT 6.1* 5.6* 5.6* 5.2*  ALBUMIN 3.1* 2.6* 2.5* 2.2*   No results for input(s): LIPASE, AMYLASE in the last 168 hours. No results for input(s): AMMONIA in the last 168 hours. Coagulation Profile: Recent Labs  Lab 04/18/19 2323  INR 1.1   Cardiac Enzymes: No results for input(s): CKTOTAL, CKMB, CKMBINDEX, TROPONINI in the last 168 hours. BNP (last 3 results) No results for input(s): PROBNP in the last 8760 hours. HbA1C: No results for input(s): HGBA1C in the last 72 hours. CBG: No results for input(s): GLUCAP in the last 168 hours. Lipid Profile: No results for input(s): CHOL, HDL, LDLCALC, TRIG, CHOLHDL, LDLDIRECT in the last 72 hours. Thyroid Function Tests: No results for input(s): TSH, T4TOTAL, FREET4, T3FREE, THYROIDAB in the last 72 hours. Anemia Panel: Recent Labs    04/21/19 0224  VITAMINB12 175*  FOLATE 7.7  FERRITIN 126  TIBC 298  IRON 15*  RETICCTPCT 1.5   Sepsis Labs: No results for input(s): PROCALCITON, LATICACIDVEN in the last 168 hours.  Recent Results (from the past 240 hour(s))  Urine culture     Status: None   Collection Time: 04/19/19  1:20 AM   Specimen: Urine, Clean Catch  Result Value Ref Range Status   Specimen Description   Final    URINE, CLEAN CATCH Performed at [facility], [address] [place], [place] [address]    Special Requests   Final    NONE Performed at [facility], [address], [place], [place] [address]    Culture   Final    NO GROWTH Performed at [facility], [place] [address] [place], [place] [address]    Report Status 04/20/2019 FINAL   Final  SARS Coronavirus 2 [facility] order, Performed in [facility] hospital lab) Nasopharyngeal Nasopharyngeal Swab     Status: None   Collection Time: 04/19/19  1:20 AM   Specimen: Nasopharyngeal Swab  Result Value Ref Range Status   SARS Coronavirus 2 NEGATIVE NEGATIVE Final    Comment: (NOTE) If result is NEGATIVE SARS-CoV-2 target nucleic acids are NOT DETECTED. The SARS-CoV-2 RNA is generally detectable in upper and lower  respiratory specimens during the acute phase of infection. The lowest  concentration of SARS-CoV-2 viral copies this assay can detect is 250  copies / mL. A negative result does not preclude SARS-CoV-2 infection  and should not be used as the sole basis for treatment or other  patient management decisions.  A negative result may occur with  improper specimen collection / handling, submission of specimen other  than nasopharyngeal swab, presence of viral mutation(s) within the  areas targeted by this assay, and inadequate number of viral copies  (<250 copies / mL). A negative result must be combined with clinical  observations, patient history, and epidemiological information. If result is POSITIVE SARS-CoV-2 target nucleic acids are DETECTED. The SARS-CoV-2 RNA is generally detectable in upper and lower  respiratory specimens dur ing the acute phase of infection.  Positive  results are indicative of active infection with SARS-CoV-2.  Clinical  correlation with patient history and other diagnostic information is  necessary to determine patient infection status.  Positive results do  not rule out bacterial infection or co-infection with other viruses. If result is PRESUMPTIVE POSTIVE SARS-CoV-2 nucleic acids MAY BE PRESENT.   A presumptive positive result was obtained on the submitted specimen  and confirmed on repeat testing.  While 2019 novel coronavirus  (SARS-CoV-2) nucleic acids may be present in the submitted sample  additional confirmatory testing may  be  necessary for epidemiological  and / or clinical management purposes  to differentiate between  SARS-CoV-2 and other Sarbecovirus currently known to infect humans.  If clinically indicated additional testing with an alternate test  methodology [phone]) is advised. The SARS-CoV-2 RNA is generally  detectable in upper and lower respiratory sp ecimens during the acute  phase of infection. The expected result is Negative. Fact Sheet for Patients:  StrictlyIdeas.no Fact Sheet for Healthcare Providers: BankingDealers.co.za This test is not yet approved or cleared by the [place] FDA and has been authorized for detection and/or diagnosis of SARS-CoV-2 by FDA under an Emergency Use Authorization (EUA).  This EUA will remain in effect (meaning this test can be used) for the duration of the COVID-19 declaration under Section 564(b)(1) of the Act, 21 U.S.C. section 360bbb-3(b)(1), unless the authorization is terminated or revoked sooner. Performed at [facility], [address] [place],  [address]     Radiology Studies: No results found. Scheduled Meds:  acetaminophen  500 mg Oral Q8H   clopidogrel  75 mg Oral Daily   docusate sodium  100 mg Oral BID   donepezil  10 mg Oral QHS   DULoxetine  60 mg Oral Daily   enoxaparin (LOVENOX) injection  40 mg Subcutaneous Q24H   iron polysaccharides  150 mg Oral Daily   loratadine  10 mg Oral Daily   memantine  5 mg Oral BID   multivitamin with minerals  1 tablet Oral Daily   pantoprazole  40 mg Oral Daily   polyethylene glycol  17 g Oral Daily   potassium chloride  10 mEq Oral QHS   Ensure Max Protein  11 oz Oral Daily   vitamin B-12  1,000 mcg Oral Daily   Continuous Infusions:   LOS: 3 days   Kerney Elbe, DO [facility] PAGER is on AMION  If 7PM-7AM, please contact night-coverage [email or web address] Password [facility] 04/22/2019, 6:02 PM

## 2019-04-22 NOTE — Progress Notes (Signed)
Pt continues to refuse to eat, or to allow staff to assist with ADLs. Pt refused VS earlier this shift. MD made aware. Continue to attempt to provide care.

## 2019-04-23 LAB — CBC WITH DIFFERENTIAL/PLATELET
Abs Immature Granulocytes: 0.14 10*3/uL — ABNORMAL HIGH (ref 0.00–0.07)
Basophils Absolute: 0 10*3/uL (ref 0.0–0.1)
Basophils Relative: 1 %
Eosinophils Absolute: 0.1 10*3/uL (ref 0.0–0.5)
Eosinophils Relative: 3 %
HCT: 28 % — ABNORMAL LOW (ref 36.0–46.0)
Hemoglobin: 9 g/dL — ABNORMAL LOW (ref 12.0–15.0)
Immature Granulocytes: 3 %
Lymphocytes Relative: 15 %
Lymphs Abs: 0.7 10*3/uL (ref 0.7–4.0)
MCH: 32.4 pg (ref 26.0–34.0)
MCHC: 32.1 g/dL (ref 30.0–36.0)
MCV: 100.7 fL — ABNORMAL HIGH (ref 80.0–100.0)
Monocytes Absolute: 0.7 10*3/uL (ref 0.1–1.0)
Monocytes Relative: 16 %
Neutro Abs: 2.7 10*3/uL (ref 1.7–7.7)
Neutrophils Relative %: 62 %
Platelets: 105 10*3/uL — ABNORMAL LOW (ref 150–400)
RBC: 2.78 MIL/uL — ABNORMAL LOW (ref 3.87–5.11)
RDW: 17 % — ABNORMAL HIGH (ref 11.5–15.5)
WBC: 4.3 10*3/uL (ref 4.0–10.5)
nRBC: 0.5 % — ABNORMAL HIGH (ref 0.0–0.2)

## 2019-04-23 LAB — COMPREHENSIVE METABOLIC PANEL
ALT: 11 U/L (ref 0–44)
AST: 27 U/L (ref 15–41)
Albumin: 2.2 g/dL — ABNORMAL LOW (ref 3.5–5.0)
Alkaline Phosphatase: 76 U/L (ref 38–126)
Anion gap: 6 (ref 5–15)
BUN: 14 mg/dL (ref 8–23)
CO2: 28 mmol/L (ref 22–32)
Calcium: 8.7 mg/dL — ABNORMAL LOW (ref 8.9–10.3)
Chloride: 108 mmol/L (ref 98–111)
Creatinine, Ser: 0.71 mg/dL (ref 0.44–1.00)
GFR calc Af Amer: >60 mL/min (ref >60)
GFR calc non Af Amer: >60 mL/min (ref >60)
Glucose, Bld: 116 mg/dL — ABNORMAL HIGH (ref 70–99)
Potassium: 4 mmol/L (ref 3.5–5.1)
Sodium: 142 mmol/L (ref 135–145)
Total Bilirubin: 1 mg/dL (ref 0.3–1.2)
Total Protein: 5.2 g/dL — ABNORMAL LOW (ref 6.5–8.1)

## 2019-04-23 LAB — MAGNESIUM: Magnesium: 1.9 mg/dL (ref 1.7–2.4)

## 2019-04-23 LAB — PHOSPHORUS: Phosphorus: 2.2 mg/dL — ABNORMAL LOW (ref 2.5–4.6)

## 2019-04-23 MED ORDER — POLYSACCHARIDE IRON COMPLEX 150 MG PO CAPS: 150.0000 mg | ORAL_CAPSULE | Freq: Every day | ORAL | 1 refills | Status: AC

## 2019-04-23 MED ORDER — HYDROCODONE-ACETAMINOPHEN 5-325 MG PO TABS: 1.0000 | ORAL_TABLET | Freq: Four times a day (QID) | ORAL | 0 refills | Status: DC | PRN

## 2019-04-23 MED ORDER — CYANOCOBALAMIN 1000 MCG PO TABS: 1000.0000 ug | ORAL_TABLET | Freq: Every day | ORAL | 2 refills | Status: AC

## 2019-04-23 NOTE — Care Management (Signed)
Patient's daughter [name] returned calls. Explained [employer] planning to deliver DME today between 3 pm and 7 pm. Once DME delivered asked [name] to call [phone] and ask for her mother's nurse , so [facility] can be called.   [name] asking if there is a co pay or any cost of bed. Explained [place] will provide that information and asked for a better phone number she can be contacted at . [name] provided land line [phone]. [name]'s brother wants to use the "cheapest company" to get the hospital bed through. Explained regardless of company cost will be the same due to using patient's insurance. Patient's son also wants to know cost of [facility] transport home. Explained [facility] will file with insurance company, we will not know cost until then.   Patient's son became upset, [name] stated she will talk to her brother regarding rather they want [facility] transportation or if they will transport home.   [facility] paperwork in shadow chart in case needed.   Called Zack with [employer] and asked for [employer] to call [name] at [phone] regarding DME questions.   Magdalen Spatz RN

## 2019-04-23 NOTE — Care Management (Signed)
Spoke to [name] [phone] , they have decided to receive the bed from [facility]. Once bed is set up and ready , she will call [facility] and ask for her mother's nurse. Then [facility] can be called. Magdalen Spatz RN

## 2019-04-23 NOTE — Discharge Summary (Signed)
[facility]  Physician Discharge Summary   Patient ID: [name] MRN: [id number] DOB/AGE: 1940/09/13 78 y.o.  Admit date: 04/18/2019 Discharge date: 04/23/2019  PCP: Celene Squibb, MD  DISCHARGE DIAGNOSES:  Left hip fracture History of stroke Essential hypertension History of dementia History of rheumatoid arthritis Osteoporosis History of anxiety and depression Anemia due to operative blood loss    RECOMMENDATIONS FOR OUTPATIENT FOLLOW UP: 1. Patient to go home with home health.  She will be living with her daughter 2. Patient may benefit from either palliative care or hospice services at home if she declines over the next couple of weeks.    Home Health: PT/OT Equipment/Devices: Hospital bed, walker  CODE STATUS: Full code  DISCHARGE CONDITION: fair  Diet recommendation: As tolerated  INITIAL HISTORY: 78 y.o.female,w gerd, hypothyroidism, RA, hypertension, h/o CVA, dementia, h/o breast cancer, neuropathy, presents with c/o fall at home in living room. Family heard loud noise and found patient on the floor complaining of left hip/ leg pain.  Patient was hospitalized for further management of left hip fracture.  Consultations:  Orthopedics  Procedures: Left Arthroplasty Bipolar Hip (Hemiarthorplasty) Done by Dr. Marcelino Scot on 04/19/2019   HOSPITAL COURSE:   Displaced L femoral neck fracture status post left hip arthroplasty by Dr. Marcelino Scot postoperative day 3 Patient was seen by orthopedic surgery.  She underwent surgical intervention.  Pain seems to be reasonably well controlled.  She was seen by PT and OT who recommended skilled nursing facility.  However due to her dementia patient was not participating with physical therapy and so the skilled nursing facility placement was denied by her insurance.  This was discussed with the daughter.  She will take her home.  Hospital bed to be arranged.  Patient has not been eating much.  Daughter interested in  palliative care.  She was told that if patient does not improve or declines over the next 1 to 2 weeks then palliative care and even hospice services may be reasonable.  This can be discussed with patient's primary care provider.  Home health has been ordered.   H/o CVA Continue Plavix  Essential hypertension Continue home medications  Dementia Continue home medications  RA May resume methotrexate  Osteoporosis -Will need to continue Proliaas outpatient -Vitamin D level is being checked and vitamin D 1,25 D hydroxy was 28.7, vitamin D 25 hydroxy was 49.7  Anxiety and Depression -Continue with Duloxetine 60 mg p.o. daily  Leukocytosis, improved  -Patient's leukocytosis likely elevated in the setting of surgery and pain  Normocytic anemia/Post-Operative Acute Blood Loss Anemia  -Likely drop from surgical intervention and question dilutional drop -Checked Anemia Panel and showed iron level of 15, U IBC of 283, TIBC of 298, saturation ratios of 5%, ferritin level 126, folate level 7.7, and vitamin B12 level 125 -started on iron and vitamin B12 supplements.     Thrombocytopenia, worsened again Platelet counts are stable.  Renal Insufficiency/mild AKI improved Renal function back to baseline.  Hyperglycemia Outpatient monitoring  Overall stable.  Okay for discharge home today.  Hospital bed to be arranged.  Patient may also need transportation by ambulance.  Daughter to come into the hospital to determine this.    PERTINENT LABS:  The results of significant diagnostics from this hospitalization (including imaging, microbiology, ancillary and laboratory) are listed below for reference.    Microbiology: Recent Results (from the past 240 hour(s))  Urine culture     Status: None   Collection Time: 04/19/19  1:20  AM   Specimen: Urine, Clean Catch  Result Value Ref Range Status   Specimen Description   Final    URINE, CLEAN CATCH Performed at [facility], [address], [place], [place] [address]    Special Requests   Final    NONE Performed at [facility], [address] [place], [place] [address]    Culture   Final    NO GROWTH Performed at [facility], [place] [address] [place], [place] [address]    Report Status 04/20/2019 FINAL  Final  SARS Coronavirus 2 [facility] order, Performed in [facility] hospital lab) Nasopharyngeal Nasopharyngeal Swab     Status: None   Collection Time: 04/19/19  1:20 AM   Specimen: Nasopharyngeal Swab  Result Value Ref Range Status   SARS Coronavirus 2 NEGATIVE NEGATIVE Final    Comment: (NOTE) If result is NEGATIVE SARS-CoV-2 target nucleic acids are NOT DETECTED. The SARS-CoV-2 RNA is generally detectable in upper and lower  respiratory specimens during the acute phase of infection. The lowest  concentration of SARS-CoV-2 viral copies this assay can detect is 250  copies / mL. A negative result does not preclude SARS-CoV-2 infection  and should not be used as the sole basis for treatment or other  patient management decisions.  A negative result may occur with  improper specimen collection / handling, submission of specimen other  than nasopharyngeal swab, presence of viral mutation(s) within the  areas targeted by this assay, and inadequate number of viral copies  (<250 copies / mL). A negative result must be combined with clinical  observations, patient history, and epidemiological information. If result is POSITIVE SARS-CoV-2 target nucleic acids are DETECTED. The SARS-CoV-2 RNA is generally detectable in upper and lower  respiratory specimens dur ing the acute phase of infection.  Positive  results are indicative of active infection with SARS-CoV-2.  Clinical  correlation with patient history and other diagnostic information is  necessary to determine patient infection status.  Positive results do  not rule out bacterial infection or co-infection with other viruses. If result is  PRESUMPTIVE POSTIVE SARS-CoV-2 nucleic acids MAY BE PRESENT.   A presumptive positive result was obtained on the submitted specimen  and confirmed on repeat testing.  While 2019 novel coronavirus  (SARS-CoV-2) nucleic acids may be present in the submitted sample  additional confirmatory testing may be necessary for epidemiological  and / or clinical management purposes  to differentiate between  SARS-CoV-2 and other Sarbecovirus currently known to infect humans.  If clinically indicated additional testing with an alternate test  methodology [phone]) is advised. The SARS-CoV-2 RNA is generally  detectable in upper and lower respiratory sp ecimens during the acute  phase of infection. The expected result is Negative. Fact Sheet for Patients:  StrictlyIdeas.no Fact Sheet for Healthcare Providers: BankingDealers.co.za This test is not yet approved or cleared by the [place] FDA and has been authorized for detection and/or diagnosis of SARS-CoV-2 by FDA under an Emergency Use Authorization (EUA).  This EUA will remain in effect (meaning this test can be used) for the duration of the COVID-19 declaration under Section 564(b)(1) of the Act, 21 U.S.C. section 360bbb-3(b)(1), unless the authorization is terminated or revoked sooner. Performed at [facility], [address], [place], [place] [address]      Labs: Basic Metabolic Panel: Recent Labs  Lab 04/19/19 0253 04/20/19 0040 04/20/19 0543 04/21/19 0224 04/22/19 0634 04/23/19 0353  NA 137  --  138 140 142 142  K 3.8  --  3.9 4.1 3.8 4.0  CL 105  --  105 105 107 108  CO2 27  --  26 28 29 28   GLUCOSE 135*  --  149* 117* 100* 116*  BUN 14  --  12 17 14 14   CREATININE 0.76 0.79 0.96 1.04* 0.78 0.71  CALCIUM 9.2  --  8.9 8.8* 8.6* 8.7*  MG  --   --   --  2.0 1.9 1.9  PHOS  --   --   --  2.1* 2.5 2.2*   Liver Function Tests: Recent Labs  Lab 04/19/19 0253 04/20/19 0543  04/21/19 0224 04/22/19 0634 04/23/19 0353  AST 33 30 26 22 27   ALT 21 19 13 10 11   ALKPHOS 113 87 82 76 76  BILITOT 0.8 0.7 0.7 0.7 1.0  PROT 6.1* 5.6* 5.6* 5.2* 5.2*  ALBUMIN 3.1* 2.6* 2.5* 2.2* 2.2*   CBC: Recent Labs  Lab 04/18/19 2323  04/20/19 0040 04/20/19 0543 04/21/19 0224 04/22/19 0634 04/23/19 0353  WBC 9.0   < > 14.5* 12.5* 8.2 6.0 4.3  NEUTROABS 8.1*  --   --   --  6.0  --  2.7  HGB 12.4   < > 12.3 10.9* 9.6* 8.8* 9.0*  HCT 38.4   < > 37.6 33.5* 30.0* 27.3* 28.0*  MCV 97.5   < > 97.7 98.2 99.0 99.3 100.7*  PLT 138*   < > 153 162 113* 93* 105*   < > = values in this interval not displayed.     IMAGING STUDIES Dg Chest 1 View  Result Date: 04/19/2019 CLINICAL DATA:  Preop left hip fracture EXAM: CHEST  1 VIEW COMPARISON:  None. FINDINGS: Mildly hyperexpanded lungs. Normal cardiomediastinal contours. No focal airspace disease. IMPRESSION: No active disease. Electronically Signed   By: Ulyses Jarred M.D.   On: 04/19/2019 00:20   Ct Head Wo Contrast  Result Date: 04/18/2019 CLINICAL DATA:  [age]-year-old female with admit missed fall. Altered mental status. History of dementia. EXAM: CT HEAD WITHOUT CONTRAST CT CERVICAL SPINE WITHOUT CONTRAST TECHNIQUE: Multidetector CT imaging of the head and cervical spine was performed following the standard protocol without intravenous contrast. Multiplanar CT image reconstructions of the cervical spine were also generated. COMPARISON:  Head CT dated 08/01/2018 FINDINGS: CT HEAD FINDINGS Brain: There is moderate age-related atrophy and chronic microvascular ischemic changes. Bilateral temporal hypodensities likely old infarcts. There is no acute intracranial hemorrhage. No mass effect or midline shift. No extra-axial fluid collection. Vascular: No hyperdense vessel or unexpected calcification. Skull: Normal. Negative for fracture or focal lesion. Sinuses/Orbits: No acute finding. Other: None CT CERVICAL SPINE FINDINGS Alignment: No  acute subluxation. Mild reversal of normal cervical lordosis, likely secondary to degenerative changes. Skull base and vertebrae: No acute fracture. Osteopenia. Soft tissues and spinal canal: No prevertebral fluid or swelling. No visible canal hematoma. Disc levels: Extensive multilevel degenerative changes with endplate irregularity and disc space narrowing. Multilevel facet arthropathy. Upper chest: Negative. Other: None IMPRESSION: 1. No acute intracranial hemorrhage. 2. Age-related atrophy and chronic microvascular ischemic changes. Old bilateral temporal infarcts. 3. No acute/traumatic cervical spine pathology. Extensive degenerative changes. Electronically Signed   By: Anner Crete M.D.   On: 04/18/2019 23:24   Ct Cervical Spine Wo Contrast  Result Date: 04/18/2019 CLINICAL DATA:  [age]-year-old female with admit missed fall. Altered mental status. History of dementia. EXAM: CT HEAD WITHOUT CONTRAST CT CERVICAL SPINE WITHOUT CONTRAST TECHNIQUE: Multidetector CT imaging of the  head and cervical spine was performed following the standard protocol without intravenous contrast. Multiplanar CT image reconstructions of the cervical spine were also generated. COMPARISON:  Head CT dated 08/01/2018 FINDINGS: CT HEAD FINDINGS Brain: There is moderate age-related atrophy and chronic microvascular ischemic changes. Bilateral temporal hypodensities likely old infarcts. There is no acute intracranial hemorrhage. No mass effect or midline shift. No extra-axial fluid collection. Vascular: No hyperdense vessel or unexpected calcification. Skull: Normal. Negative for fracture or focal lesion. Sinuses/Orbits: No acute finding. Other: None CT CERVICAL SPINE FINDINGS Alignment: No acute subluxation. Mild reversal of normal cervical lordosis, likely secondary to degenerative changes. Skull base and vertebrae: No acute fracture. Osteopenia. Soft tissues and spinal canal: No prevertebral fluid or swelling. No visible canal  hematoma. Disc levels: Extensive multilevel degenerative changes with endplate irregularity and disc space narrowing. Multilevel facet arthropathy. Upper chest: Negative. Other: None IMPRESSION: 1. No acute intracranial hemorrhage. 2. Age-related atrophy and chronic microvascular ischemic changes. Old bilateral temporal infarcts. 3. No acute/traumatic cervical spine pathology. Extensive degenerative changes. Electronically Signed   By: Anner Crete M.D.   On: 04/18/2019 23:24   Ct Abdomen Pelvis W Contrast  Result Date: 04/19/2019 CLINICAL DATA:  78 year old female with and abdominal trauma. Left hip fracture. EXAM: CT ABDOMEN AND PELVIS WITH CONTRAST TECHNIQUE: Multidetector CT imaging of the abdomen and pelvis was performed using the standard protocol following bolus administration of intravenous contrast. CONTRAST:  117mL OMNIPAQUE IOHEXOL 300 MG/ML  SOLN COMPARISON:  CT of the abdomen pelvis dated 08/29/2018 FINDINGS: Lower chest: Bibasilar dependent atelectatic changes. There is compressive atelectasis of the left lower lobe secondary to a large hiatal hernia. There is mild cardiomegaly. Calcification of the mitral annulus noted. No intra-abdominal free air or free fluid. Hepatobiliary: The liver is unremarkable. No intrahepatic biliary ductal dilatation. No calcified gallstone or pericholecystic fluid. Pancreas: Unremarkable. No pancreatic ductal dilatation or surrounding inflammatory changes. Spleen: Normal in size without focal abnormality. Adrenals/Urinary Tract: The adrenal glands are unremarkable as visualized. There is a 3 cm left renal upper pole cyst and additional smaller bilateral peripelvic cyst. There is no hydronephrosis on either side. There is symmetric enhancement and excretion of contrast by both kidneys. The visualized ureters and urinary bladder appear unremarkable. Stomach/Bowel: Scattered sigmoid diverticula without active inflammatory changes. There is a large hiatal hernia. Large  amount of stool noted throughout the colon. There is no bowel obstruction or active inflammation. The appendix is normal. Vascular/Lymphatic: Mild aortoiliac atherosclerotic disease. The IVC is unremarkable. No portal venous gas. There is no adenopathy. Reproductive: Hysterectomy. No pelvic mass. Other: None Musculoskeletal: Osteopenia with scoliosis and degenerative changes of the spine. There is a displaced fracture of the left femoral neck with mild proximal migration of the femoral shaft. No dislocation. No definite other acute osseous pathology identified. IMPRESSION: 1. Displaced left femoral neck fracture. 2. Large hiatal hernia. 3. Colonic diverticulosis. No bowel obstruction or active inflammation. Normal appendix. Aortic Atherosclerosis (ICD10-I70.0). Electronically Signed   By: Anner Crete M.D.   On: 04/19/2019 03:16   Pelvis Portable  Result Date: 04/19/2019 CLINICAL DATA:  Femoral head fracture. EXAM: PORTABLE PELVIS 1-2 VIEWS COMPARISON:  April 18, 2019 FINDINGS: The patient has undergone total hip arthroplasty on the left. The alignment appears near anatomic. There are expected postsurgical changes including subcutaneous gas and overlying soft tissue edema. There is no acute displaced fracture. No dislocation. Phleboliths project over the patient's pelvis. IMPRESSION: Status post total hip arthroplasty on the left. Electronically Signed   By:  Constance Holster M.D.   On: 04/19/2019 20:48   Dg Hip Unilat With Pelvis 1v Left  Result Date: 04/19/2019 CLINICAL DATA:  Femoral head fracture EXAM: DG HIP (WITH OR WITHOUT PELVIS) 1V*L* COMPARISON:  April 18, 2019 FINDINGS: The patient has undergone total hip arthroplasty on the left. There are expected postsurgical changes including subcutaneous gas and overlying soft tissue edema. The alignment appears near anatomic. There is no unexpected radiopaque foreign body. IMPRESSION: Status post total hip arthroplasty on the left. Electronically  Signed   By: Constance Holster M.D.   On: 04/19/2019 20:49   Dg Hip Unilat With Pelvis 2-3 Views Left  Result Date: 04/19/2019 CLINICAL DATA:  Fall at home today with left hip pain. EXAM: DG HIP (WITH OR WITHOUT PELVIS) 2-3V LEFT COMPARISON:  None. FINDINGS: Displaced left femoral neck fracture. Proximal migration of the femoral shaft. Femoral head remains seated. Pubic rami and remainder of the bony pelvis is intact. Pubic symphysis and sacroiliac joints are congruent. IMPRESSION: Displaced left femoral neck fracture. Electronically Signed   By: Keith Rake M.D.   On: 04/19/2019 00:10    DISCHARGE EXAMINATION: Vitals:   04/21/19 0802 04/21/19 2004 04/22/19 0546 04/23/19 0500  BP: 129/67 (!) 104/49 136/62 (!) 141/74  Pulse: 90 81 87 88  Resp: 16 15  14   Temp: 99.9 F (37.7 C) 98.3 F (36.8 C) 99 F (37.2 C) 98.2 F (36.8 C)  TempSrc: Oral Oral Oral Axillary  SpO2: 94% 97% 95% 98%  Weight:      Height:       General appearance: Awake alert.  In no distress.  Pleasantly confused Resp: Clear to auscultation bilaterally.  Normal effort Cardio: S1-S2 is normal regular.  No S3-S4.  No rubs murmurs or bruit GI: Abdomen is soft.  Nontender nondistended.  Bowel sounds are present normal.  No masses organomegaly Extremities: No edema.   Neurologic:  No focal neurological deficits.    DISPOSITION: Home with home health  Discharge Instructions    Call MD for:  difficulty breathing, headache or visual disturbances   Complete by: As directed    Call MD for:  persistant dizziness or light-headedness   Complete by: As directed    Call MD for:  severe uncontrolled pain   Complete by: As directed    Call MD for:  temperature >100.4   Complete by: As directed    Discharge instructions   Complete by: As directed    You were cared for by a hospitalist during your hospital stay. If you have any questions about your discharge medications or the care you received while you were in the  hospital after you are discharged, you can call the unit and asked to speak with the hospitalist on call if the hospitalist that took care of you is not available. Once you are discharged, your primary care physician will handle any further medical issues. Please note that NO REFILLS for any discharge medications will be authorized once you are discharged, as it is imperative that you return to your primary care physician (or establish a relationship with a primary care physician if you do not have one) for your aftercare needs so that they can reassess your need for medications and monitor your lab values. If you do not have a primary care physician, you can call [phone] for a physician referral.   Increase activity slowly   Complete by: As directed          Allergies as of  04/23/2019      Reactions   Celecoxib Swelling, Other (See Comments)   Ibuprofen Swelling, Other (See Comments)   Diphenhydramine Rash, Other (See Comments)   Wide awake.    Wygesic [propoxyphene N-acetaminophen] Nausea And Vomiting      Medication List    TAKE these medications   ALPRAZolam 0.25 MG tablet Commonly known as: XANAX Take 0.25-50 mg by mouth 3 (three) times daily as needed for anxiety.   Butenafine HCl 1 % cream Apply 1 application topically daily.   Caltrate 600+D 600-400 MG-UNIT tablet Generic drug: Calcium Carbonate-Vitamin D Take 1 tablet by mouth 2 (two) times daily.   cetirizine 10 MG tablet Commonly known as: ZYRTEC Take 10 mg by mouth daily as needed for allergies.   clopidogrel 75 MG tablet Commonly known as: PLAVIX Take 1 tablet (75 mg total) by mouth daily.   Cranberry 125 MG Tabs Take 1 tablet by mouth 3 (three) times daily as needed.   cyanocobalamin 1000 MCG tablet Take 1 tablet (1,000 mcg total) by mouth daily. Start taking on: April 24, 2019   denosumab 60 MG/ML Soln injection Commonly known as: PROLIA Inject 60 mg into the skin every 6 (six) months. Administer in  upper arm, thigh, or abdomen, got in June 2019   donepezil 10 MG tablet Commonly known as: ARICEPT Take 1 tablet (10 mg total) by mouth at bedtime.   DULoxetine 60 MG capsule Commonly known as: CYMBALTA Take 60 mg by mouth daily.   EQ Natural Vegetable Laxative 8.6 MG tablet Generic drug: senna Take 1 tablet by mouth daily as needed for constipation.   folic acid 818 MCG tablet Commonly known as: FOLVITE Take 800 mcg by mouth daily.   HYDROcodone-acetaminophen 5-325 MG tablet Commonly known as: NORCO/VICODIN Take 1 tablet by mouth every 6 (six) hours as needed for moderate pain or severe pain.   iron polysaccharides 150 MG capsule Commonly known as: NIFEREX Take 1 capsule (150 mg total) by mouth daily. Start taking on: April 24, 2019   lisinopril 10 MG tablet Commonly known as: ZESTRIL Take 5 mg by mouth daily as needed (for blood pressure). Son says she takes it as needed when she feels her BP is high, she will check it and then take it if needed.   Magnesium 500 MG Caps Take 500 mg by mouth daily.   memantine 10 MG tablet Commonly known as: NAMENDA Take 5 mg by mouth 2 (two) times daily.   methotrexate 2.5 MG tablet Commonly known as: RHEUMATREX Take 15 mg by mouth once a week. Every Monday   Myrbetriq 25 MG Tb24 tablet Generic drug: mirabegron ER Take 25 mg by mouth daily.   omeprazole 20 MG capsule Commonly known as: PRILOSEC Take 20 mg by mouth 2 (two) times daily before a meal.   ondansetron 4 MG tablet Commonly known as: ZOFRAN Take 4 mg by mouth every 8 (eight) hours as needed for nausea or vomiting.   OVER THE COUNTER MEDICATION Take 8 oz by mouth daily as needed (leg pain). Tonic Water with Quinine   polyethylene glycol 17 g packet Commonly known as: MIRALAX / GLYCOLAX Take 17 g by mouth daily.   potassium chloride 10 MEQ tablet Commonly known as: K-DUR Take 10 mEq by mouth at bedtime.   ProAir HFA 108 (90 Base) MCG/ACT inhaler Generic  drug: albuterol Inhale 2 puffs into the lungs every 6 (six) hours as needed for wheezing or shortness of breath.   Tylenol 325  MG tablet Generic drug: acetaminophen Take 500 mg by mouth every 6 (six) hours as needed for mild pain or moderate pain.   Vitamin D 50 MCG (2000 UT) Caps Take 2,000 Units by mouth daily.   Zinc 50 MG Caps Take 50 mg by mouth daily.            Durable Medical Equipment  (From admission, onward)         Start     Ordered   04/23/19 1046  For home use only DME Hospital bed  Once    Comments: Over the bed table  Call daughter [name] for delivery Thanks  Question Answer Comment  Length of Need Lifetime   Patient has (list medical condition): HEMIARTHROPLASTY) (Left Hip)   The above medical condition requires: Patient requires the ability to reposition frequently   Head must be elevated greater than: 45 degrees   Bed type Semi-electric   Support Surface: Gel Overlay      04/23/19 1046           Follow-up Information    Altamese Manley, MD. Schedule an appointment as soon as possible for a visit in 2 week(s).   Specialty: Orthopedic Surgery Contact information: [place] [address] [place], [place] Follow up.   Contact information: [place] [address] [facility] Follow up.   Contact information: [place] [address] [phone]           TOTAL DISCHARGE TIME: 35 minutes  [facility] Pager on [email or web address]  04/23/2019, 1:41 PM

## 2019-04-23 NOTE — Care Management (Signed)
    Durable Medical Equipment  (From admission, onward)         Start     Ordered   04/23/19 1046  For home use only DME Hospital bed  Once    Comments: Over the bed table  Call daughter [name] for delivery Thanks  Question Answer Comment  Length of Need Lifetime   Patient has (list medical condition): HEMIARTHROPLASTY) (Left Hip)   The above medical condition requires: Patient requires the ability to reposition frequently   Head must be elevated greater than: 45 degrees   Bed type Semi-electric   Support Surface: Gel Overlay      04/23/19 1046

## 2019-04-23 NOTE — Progress Notes (Signed)
[facility] to pick up patient at 2310. VSS. Pt given incontinent care. Mesh panty and pad placed for transport to daughters home [name] . Immobilizer placed on LLE.  AVS printed and discharge summary printed and sent in envelope with [facility] staff with instructions to give to [name].  Called Daughter Georga Bora. [phone]) to give ETA. Discussed MD's discharge notes and plan of care. Noted pt did not eat well this evening. Only two spoonfuls of applesauce and two sips of water. Advised to follow up with Doctor and Case Manager for questions.

## 2019-04-23 NOTE — Discharge Instructions (Signed)
Orthopaedic Trauma Service Discharge Instructions   General Discharge Instructions  WEIGHT BEARING STATUS: Weightbearing as tolerated left leg  RANGE OF MOTION/ACTIVITY: Posterior hip precautions left hip.  Ambulate with walker.  Up with assistance only.  Bone health: Continue with osteoporosis treatment including Prolia.  Your vitamin D levels look really good.  Continue with supplementation as you are doing.  Wound Care: Daily wound care as needed starting when you discharge from the hospital.  Please see below  Discharge Wound Care Instructions  Do NOT apply any ointments, solutions or lotions to pin sites or surgical wounds.  These prevent needed drainage and even though solutions like hydrogen peroxide kill bacteria, they also damage cells lining the pin sites that help fight infection.  Applying lotions or ointments can keep the wounds moist and can cause them to breakdown and open up as well. This can increase the risk for infection. When in doubt call the office.  Surgical incisions should be dressed daily.  If any drainage is noted, use one layer of adaptic, then gauze, Kerlix, and an ace wrap.  Once the incision is completely dry and without drainage, it may be left open to air out.  Showering may begin 36-48 hours later.  Cleaning gently with soap and water.  Traumatic wounds should be dressed daily as well.    One layer of adaptic, gauze, Kerlix, then ace wrap.  The adaptic can be discontinued once the draining has ceased    If you have a wet to dry dressing: wet the gauze with saline the squeeze as much saline out so the gauze is moist (not soaking wet), place moistened gauze over wound, then place a dry gauze over the moist one, followed by Kerlix wrap, then ace wrap.  DVT/PE prophylaxis: Lovenox 40 mg subcutaneous injection daily x 3 weeks   Diet: as you were eating previously.  Can use over the counter stool softeners and bowel preparations, such as Miralax, to help  with bowel movements.  Narcotics can be constipating.  Be sure to drink plenty of fluids  PAIN MEDICATION USE AND EXPECTATIONS  You have likely been given narcotic medications to help control your pain.  After a traumatic event that results in an fracture (broken bone) with or without surgery, it is ok to use narcotic pain medications to help control one's pain.  We understand that everyone responds to pain differently and each individual patient will be evaluated on a regular basis for the continued need for narcotic medications. Ideally, narcotic medication use should last no more than 6-8 weeks (coinciding with fracture healing).   As a patient it is your responsibility as well to monitor narcotic medication use and report the amount and frequency you use these medications when you come to your office visit.   We would also advise that if you are using narcotic medications, you should take a dose prior to therapy to maximize you participation.  IF YOU ARE ON NARCOTIC MEDICATIONS IT IS NOT PERMISSIBLE TO OPERATE A MOTOR VEHICLE (MOTORCYCLE/CAR/TRUCK/MOPED) OR HEAVY MACHINERY DO NOT MIX NARCOTICS WITH OTHER CNS (CENTRAL NERVOUS SYSTEM) DEPRESSANTS SUCH AS ALCOHOL   STOP SMOKING OR USING NICOTINE PRODUCTS!!!!  As discussed nicotine severely impairs your body's ability to heal surgical and traumatic wounds but also impairs bone healing.  Wounds and bone heal by forming microscopic blood vessels (angiogenesis) and nicotine is a vasoconstrictor (essentially, shrinks blood vessels).  Therefore, if vasoconstriction occurs to these microscopic blood vessels they essentially disappear and are  unable to deliver necessary nutrients to the healing tissue.  This is one modifiable factor that you can do to dramatically increase your chances of healing your injury.    (This means no smoking, no nicotine gum, patches, etc)  DO NOT USE NONSTEROIDAL ANTI-INFLAMMATORY DRUGS (NSAID'S)  Using products such as Advil  (ibuprofen), Aleve (naproxen), Motrin (ibuprofen) for additional pain control during fracture healing can delay and/or prevent the healing response.  If you would like to take over the counter (OTC) medication, Tylenol (acetaminophen) is ok.  However, some narcotic medications that are given for pain control contain acetaminophen as well. Therefore, you should not exceed more than 4000 mg of tylenol in a day if you do not have liver disease.  Also note that there are may OTC medicines, such as cold medicines and allergy medicines that my contain tylenol as well.  If you have any questions about medications and/or interactions please ask your doctor/PA or your pharmacist.      ICE AND ELEVATE INJURED/OPERATIVE EXTREMITY  Using ice and elevating the injured extremity above your heart can help with swelling and pain control.  Icing in a pulsatile fashion, such as 20 minutes on and 20 minutes off, can be followed.    Do not place ice directly on skin. Make sure there is a barrier between to skin and the ice pack.    Using frozen items such as frozen peas works well as the conform nicely to the are that needs to be iced.  USE AN ACE WRAP OR TED HOSE FOR SWELLING CONTROL  In addition to icing and elevation, Ace wraps or TED hose are used to help limit and resolve swelling.  It is recommended to use Ace wraps or TED hose until you are informed to stop.    When using Ace Wraps start the wrapping distally (farthest away from the body) and wrap proximally (closer to the body)   Example: If you had surgery on your leg or thing and you do not have a splint on, start the ace wrap at the toes and work your way up to the thigh        If you had surgery on your upper extremity and do not have a splint on, start the ace wrap at your fingers and work your way up to the upper arm   [place]: [phone]   VISIT OUR WEBSITE FOR ADDITIONAL INFORMATION: [email or web address]

## 2019-04-23 NOTE — Plan of Care (Signed)
  Problem: Safety: Goal: Ability to remain free from injury will improve Outcome: Progressing   Problem: Elimination: Goal: Will not experience complications related to bowel motility Outcome: Progressing

## 2019-04-23 NOTE — Consult Note (Signed)
   [facility] CM Inpatient Consult   04/23/2019  [name] [birth year] [id number]    Patient was checked for30% extremehigh risk scorefor unplanned readmissions and hospitalizations. Patient had been previously outreached and engaged by [facility] CM coordinators in the past. Our community based plan of care has focused on disease management and community resource support.  Per chart review and MDHistory and physical states asfollows: 78 y.o.female,w/ gerd, hypothyroidism, RA, hypertension, h/o CVA, dementia, h/o breast cancer, neuropathy,       presented with c/o fall at home in living room, with complains of left hip/ leg pain.  Patient was hospitalized for further management of left hip fracture, underwent, Left Arthroplasty Bipolar Hip (Hemiarthroplasty) done by Dr. Marcelino Scot on 04/19/2019.  Her daughter [name]) plans to take her mother (patient) home at discharge, with [facility] per transition of care CM note.  Primary Care Provider isDr. Allyn Kenner with Delphina Cahill, MD office.  Patient will be followed by an external care management group([facility]) post hospitalization to follow-up needs and for continued case management services under her [place].    For additional questions or referrals,please contact:  Karrine Kluttz A. Mustaf Antonacci, BSN, RN-BC [facility] Liaison Cell: [phone]

## 2019-04-23 NOTE — Evaluation (Signed)
Clinical/Bedside Swallow Evaluation Patient Details  Name: ANNALEE MEYERHOFF MRN: [id number] Date of Birth: December 09, 1940  Today's Date: 04/23/2019 Time: SLP Start Time (ACUTE ONLY): 1017 SLP Stop Time (ACUTE ONLY): 1050 SLP Time Calculation (min) (ACUTE ONLY): 33 min  Past Medical History:  Past Medical History:  Diagnosis Date  . Anxiety   . Arm fracture, left   . Breast cancer ([place])   . Bulging disc   . Cardioembolic stroke ([place]) 45/62/5638  . Confusion   . Depression   . Difficulty swallowing solids   . GERD (gastroesophageal reflux disease)   . Hypertension   . Hypothyroidism   . Invasive ductal carcinoma of left breast ([place]) 02/24/2014  . Neuropathy   . Osteoporosis   . 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 Rotator cuff disorder, right   . Scoliosis   . Shortness of breath dyspnea    "I have reactive airway disease".   Past Surgical History:  Past Surgical History:  Procedure Laterality Date  . ABDOMINAL HYSTERECTOMY    . Barbie Banner OSTEOTOMY Right 10/28/2014   Procedure: Barbie Banner OSTEOTOMY RIGHT FOOT;  Surgeon: Marcheta Grammes, DPM;  [facility]: [facility];  Service: Podiatry;  Laterality: Right;  . BIOPSY  05/15/2018   Procedure: BIOPSY;  Surgeon: Rogene Houston, MD;  [facility];  Service: Endoscopy;;  gastric  . BONE BIOPSY  09/29/2015   Procedure: BONE BIOPSY AND BONE CULTURE SECOND TOE RIGHT FOOT;  Surgeon: Caprice Beaver, DPM;  Location: [facility];  Service: Podiatry;;  . BREAST SURGERY Left    lumpectomy and axillary lymph node with re-excision 2 weeks later   . BUNIONECTOMY Right 10/28/2014   Procedure: SILVER BUNIONECTOMY RIGHT FOOT;  Surgeon: Marcheta Grammes, DPM;  Location: [facility];  Service: Podiatry;  Laterality: Right;  . CATARACT EXTRACTION W/PHACO Left 08/10/2017   Procedure: CATARACT EXTRACTION PHACO AND INTRAOCULAR LENS PLACEMENT LEFT EYE;  Surgeon: Baruch Goldmann, MD;  Location: [facility];  Service: Ophthalmology;  Laterality: Left;  CDE: 13.81  .  CATARACT EXTRACTION W/PHACO Right 09/07/2017   Procedure: CATARACT EXTRACTION PHACO AND INTRAOCULAR LENS PLACEMENT RIGHT EYE;  Surgeon: Baruch Goldmann, MD;  Location: [facility];  Service: Ophthalmology;  Laterality: Right;  right  . COLONOSCOPY    . DILATION AND CURETTAGE OF UTERUS    . ESOPHAGEAL DILATION N/A 05/15/2018   Procedure: ESOPHAGEAL DILATION;  Surgeon: Rogene Houston, MD;  [facility];  Service: Endoscopy;  Laterality: N/A;  . esophageal stricture    . ESOPHAGOGASTRODUODENOSCOPY N/A 05/15/2018   Procedure: ESOPHAGOGASTRODUODENOSCOPY (EGD);  Surgeon: Rogene Houston, MD;  [facility];  Service: Endoscopy;  Laterality: N/A;  1:45  . HAMMER TOE SURGERY Right 10/28/2014   Procedure: HAMMER TOE CORRECTION 2ND AND 3RD TOE RIGHT FOOT;  Surgeon: Marcheta Grammes, DPM;  Location: [facility];  Service: Podiatry;  Laterality: Right;  . HIP ARTHROPLASTY Left 04/19/2019   Procedure: ARTHROPLASTY BIPOLAR HIP (HEMIARTHROPLASTY);  Surgeon: Altamese Sandusky, MD;  Location: [facility];  Service: Orthopedics;  Laterality: Left;  . LUMBAR LAMINECTOMY/DECOMPRESSION MICRODISCECTOMY Left 03/13/2016   Procedure: Left Lumbar Four-Five, Lumbar Five-Sacral One Laminotomy/Foraminotomy;  Surgeon: Kevan Ny Ditty, MD;  [facility];  Service: Neurosurgery;  Laterality: Left;  . REMOVAL OF IMPLANT Right 09/29/2015   Procedure: REMOVAL OF IMPLANT 2ND TOE RIGHT FOOT;  Surgeon: Caprice Beaver, DPM;  Location: [facility];  Service: Podiatry;  Laterality: Right;  . TEE WITHOUT CARDIOVERSION N/A 08/05/2018   Procedure:  TRANSESOPHAGEAL ECHOCARDIOGRAM (TEE);  Surgeon: Skeet Latch, MD;  Location: [facility] END[facility];  Service: Cardiovascular;  Laterality: N/A;   HPI:  Onda is a 78 y.o. female with hx of GERD, hypothyroidism, RA, hypertension, h/o CVA, advanced dementia,  h/o breast cancer, neuropathy.  Presents with c/o fall at home in living room resulting in displaced left femoral neck fracture  and underwent closed reduction 8/15. Nursing she is been very confused and refusing her vital signs to refuse to eat or allow staff to assist with her ADLs. No longer n.p.o. as of 8/18.    Assessment / Plan / Recommendation Clinical Impression  Pt demonstrated a primarily cognitively based dysphagia marked by oral holding and mildly delayed tranist. Holding and oral swishing with crushed meds followed by sip water due to taste requirng verbal cues to expectorate. No s/s aspiration were appreciated during this assessment. Increased risk factors include pt's dementia, alertness ( RN reported pt too lethargic this am for po's) therefore recommend she continue with modified texture of Dys 3, thin liquids. Pt dislikes and sometimes refuses meds when crushed. Pt transited whole pill in puree with mastication of pill noted but no significant residue. Full supervision and assist needed. SLP will follow for safety and efficiency while in acute care venue.       SLP Visit Diagnosis: Dysphagia, unspecified (R13.10)    Aspiration Risk  Mild aspiration risk;Moderate aspiration risk    Diet Recommendation Dysphagia 3 (Mech soft);Thin liquid   Liquid Administration via: Cup;Straw Medication Administration: Whole meds with puree Supervision: Full supervision/cueing for compensatory strategies;Staff to assist with self feeding Compensations: Minimize environmental distractions;Slow rate;Small sips/bites Postural Changes: Seated upright at 90 degrees;Remain upright for at least 30 minutes after po intake    Other  Recommendations Oral Care Recommendations: Oral care BID   Follow up Recommendations Skilled Nursing facility      Frequency and Duration min 1 x/week  2 weeks       Prognosis Prognosis for Safe Diet Advancement: (fair-good) Barriers to Reach Goals: Cognitive deficits      Swallow Study   General HPI: Miaya is a 78 y.o. female with hx of GERD, hypothyroidism, RA, hypertension, h/o CVA,  advanced dementia,  h/o breast cancer, neuropathy.  Presents with c/o fall at home in living room resulting in displaced left femoral neck fracture and underwent closed reduction 8/15. Nursing she is been very confused and refusing her vital signs to refuse to eat or allow staff to assist with her ADLs. No longer n.p.o. as of 8/18.  Type of Study: Bedside Swallow Evaluation Previous Swallow Assessment: (no) Diet Prior to this Study: Dysphagia 3 (soft);Thin liquids Temperature Spikes Noted: No Respiratory Status: Room air History of Recent Intubation: No Behavior/Cognition: Alert;Cooperative;Confused;Distractible;Requires cueing Oral Cavity Assessment: Within Functional Limits Oral Care Completed by SLP: No Oral Cavity - Dentition: Adequate natural dentition Vision: Functional for self-feeding Self-Feeding Abilities: Needs assist;Needs set up Patient Positioning: Upright in bed Baseline Vocal Quality: Low vocal intensity Volitional Cough: Weak    Oral/Motor/Sensory Function Overall Oral Motor/Sensory Function: Within functional limits   Ice Chips Ice chips: Not tested   Thin Liquid Thin Liquid: Within functional limits Presentation: Cup;Straw    Nectar Thick Nectar Thick Liquid: Not tested   Honey Thick Honey Thick Liquid: Not tested   Puree Puree: Impaired Presentation: Spoon Oral Phase Impairments: Poor awareness of bolus Oral Phase Functional Implications: Oral holding Pharyngeal Phase Impairments: (none)   Solid     Solid: Within functional limits  [place] Siren 04/23/2019,11:39 AM  Orbie Pyo Colvin Caroli.Ed Risk analyst [phone] Office [phone]

## 2019-04-23 NOTE — Care Management (Signed)
[facility] plans to deliver DME today between 3 pm and 7 pm. NCM will placed PTAR paperwork in shadow chart , to call once confirmed delivery of DME. Have called [name] twice no answer , and her voicemail box is not set up. Also, have emailed her , no response as of yet.  Magdalen Spatz RN BSN [phone]

## 2019-04-23 NOTE — TOC Progression Note (Addendum)
Transition of Care [facility]) - Progression Note    Patient Details  Name: QUANNA WITTKE MRN: [id number] Date of Birth: 07-13-1941  [facility]) CM/SW Contact  Jacalyn Lefevre Edson Snowball, RN Phone Number: 04/23/2019, 10:37 AM  Clinical Narrative:   [place] has accepted referral for [facility] and [place]. Zack with [facility] has orders for hospital bed and over the bed table. [facility] will process orders with patient's insurance company and call daughter for delivery today. Will place PTAR paperwork in shadow chart for nurse to call PTAR once confirmed DME delivery. Called [name] , however she did not answer phone and her voicemail is not set up.   Spoke to daughter [name]. [name] plans to take her mother home at discharge. She is requesting a hospital bed and over the bed table. WIll order through [place].Zack aware.  Patient already has walker, 3 in 1 and cane quad at home.   [name] is requesting [facility] as first choice, Mateo Flow with [facility] checking to see if she can accept referral. If Mateo Flow cannot her seconf choice is [facility].   Expected Discharge Plan: [place] Barriers to Discharge: Continued Medical Work up  Expected Discharge Plan and Services Expected Discharge Plan: [place]   Discharge Planning Services: CM Consult Post Acute Care Choice: [place] arrangements for the past 2 months: Single Family Home                 DME Arranged: Hospital bed DME Agency: [facility] Date DME Agency Contacted: 04/23/19 Time DME Agency Contacted: [phone]   [place]: RN, PT HH Agency: [place] ([place]) Date [place]: 04/23/19 Time [place]: 1036 Representative spoke with at [place]: valerie   Social Determinants of Health ([place]) Interventions    Readmission Risk Interventions No flowsheet data found.

## 2019-04-23 NOTE — Progress Notes (Signed)
Physical Therapy Treatment Patient Details Name: [name] MRN: [id number] DOB: 26-Apr-1941 Today's Date: 04/23/2019    History of Present Illness Luvern Mischke  is a 78 y.o. female s/p L femore head fx s/p closed reduction w gerd, hypothyroidism, RA,  hypertension, h/o CVA, dementia,  h/o breast cancer, neuropathy.    PT Comments    Pt performed transfer training and progression to gt.  Per CM note daughter is now taking patient home via [place].  Based on SNF refusal will require HHPT at d/c.  Pt required mod +2 for transfers and max +2 for progression of gt training with B HHA as she lacked ability to use RW correctly.      Follow Up Recommendations  SNF     Equipment Recommendations  Rolling walker with 5" wheels;3in1 (PT)    Recommendations for Other Services       Precautions / Restrictions Precautions Precautions: Fall;Posterior Hip;Other (comment) Precaution Booklet Issued: Yes (comment) Precaution Comments: precautions posted; pt with dementia and no family present Restrictions Weight Bearing Restrictions: Yes LLE Weight Bearing: Weight bearing as tolerated    Mobility  Bed Mobility Overal bed mobility: Needs Assistance Bed Mobility: Supine to Sit;Sit to Supine   Sidelying to sit: Mod assist;+2 for physical assistance       General bed mobility comments: assist for trunk elevation and BLE movement  Transfers Overall transfer level: Needs assistance Equipment used: Rolling walker (2 wheeled) Transfers: Sit to/from Stand Sit to Stand: Mod assist;+2 physical assistance         General transfer comment: Pt able to achieve standing with RW.  Pt holding to RW to achieve standing.  Ambulation/Gait Ambulation/Gait assistance: Max assist;+2 physical assistance Gait Distance (Feet): 8 Feet Assistive device: Rolling walker (2 wheeled);None(started out with RW but pushing too far forward opted for B HHA to keep patient upright.) Gait Pattern/deviations:  Step-to pattern;Trunk flexed;Narrow base of support;Antalgic     General Gait Details: Pt with narrow BOS but able to perform sequencing correctly.  Pt guarded and presents with decreased weight bearing on LLE.   Stairs             Wheelchair Mobility    Modified Rankin (Stroke Patients Only)       Balance Overall balance assessment: Needs assistance   Sitting balance-Leahy Scale: Poor       Standing balance-Leahy Scale: Poor                              Cognition Arousal/Alertness: Awake/alert(very talkative) Behavior During Therapy: Flat affect Overall Cognitive Status: History of cognitive impairments - at baseline                                 General Comments: Pt able to follow commands better but required constant redirection.  Pt talked throughout entire conversation.      Exercises      General Comments        Pertinent Vitals/Pain Pain Assessment: Faces Faces Pain Scale: Hurts little more Pain Location: L leg Pain Descriptors / Indicators: Discomfort Pain Intervention(s): Monitored during session    Home Living                      Prior Function            PT Goals (current goals can now be found  in the care plan section) Acute Rehab PT Goals Patient Stated Goal: none stated Potential to Achieve Goals: Fair Progress towards PT goals: Progressing toward goals    Frequency    Min 3X/week      PT Plan Current plan remains appropriate    Co-evaluation              AM-PAC PT "6 Clicks" Mobility   Outcome Measure  Help needed turning from your back to your side while in a flat bed without using bedrails?: A Lot Help needed moving from lying on your back to sitting on the side of a flat bed without using bedrails?: A Lot Help needed moving to and from a bed to a chair (including a wheelchair)?: A Lot Help needed standing up from a chair using your arms (e.g., wheelchair or bedside  chair)?: A Lot Help needed to walk in hospital room?: Total Help needed climbing 3-5 steps with a railing? : Total 6 Click Score: 10    End of Session Equipment Utilized During Treatment: Gait belt Activity Tolerance: Patient tolerated treatment well Patient left: in chair;with call bell/phone within reach Nurse Communication: Mobility status;Other (comment) PT Visit Diagnosis: Other abnormalities of gait and mobility (R26.89);Pain;History of falling (Z91.81) Pain - Right/Left: Left Pain - part of body: Hip     Time: 1716-1740 PT Time Calculation (min) (ACUTE ONLY): 24 min  Charges:  $Gait Training: 8-22 mins $Therapeutic Activity: 8-22 mins                     Governor Rooks, PTA Acute Rehabilitation Services Pager [phone] Office [phone]     Talin Rozeboom Eli Hose 04/23/2019, 5:54 PM

## 2019-04-23 NOTE — Plan of Care (Signed)
  Problem: Safety: Goal: Ability to remain free from injury will improve Outcome: Progressing   

## 2019-04-24 DIAGNOSIS — E039 Hypothyroidism, unspecified: Secondary | ICD-10-CM | POA: Diagnosis not present

## 2019-04-24 DIAGNOSIS — Z8673 Personal history of transient ischemic attack (TIA), and cerebral infarction without residual deficits: Secondary | ICD-10-CM | POA: Diagnosis not present

## 2019-04-24 DIAGNOSIS — F419 Anxiety disorder, unspecified: Secondary | ICD-10-CM | POA: Diagnosis not present

## 2019-04-24 DIAGNOSIS — N289 Disorder of kidney and ureter, unspecified: Secondary | ICD-10-CM | POA: Diagnosis not present

## 2019-04-24 DIAGNOSIS — M810 Age-related osteoporosis without current pathological fracture: Secondary | ICD-10-CM | POA: Diagnosis not present

## 2019-04-24 DIAGNOSIS — S72002D Fracture of unspecified part of neck of left femur, subsequent encounter for closed fracture with routine healing: Secondary | ICD-10-CM | POA: Diagnosis not present

## 2019-04-24 DIAGNOSIS — Z853 Personal history of malignant neoplasm of breast: Secondary | ICD-10-CM | POA: Diagnosis not present

## 2019-04-24 DIAGNOSIS — F0390 Unspecified dementia without behavioral disturbance: Secondary | ICD-10-CM | POA: Diagnosis not present

## 2019-04-24 DIAGNOSIS — F329 Major depressive disorder, single episode, unspecified: Secondary | ICD-10-CM | POA: Diagnosis not present

## 2019-04-24 DIAGNOSIS — Z96642 Presence of left artificial hip joint: Secondary | ICD-10-CM | POA: Diagnosis not present

## 2019-04-24 DIAGNOSIS — D696 Thrombocytopenia, unspecified: Secondary | ICD-10-CM | POA: Diagnosis not present

## 2019-04-24 DIAGNOSIS — D62 Acute posthemorrhagic anemia: Secondary | ICD-10-CM | POA: Diagnosis not present

## 2019-04-24 DIAGNOSIS — G629 Polyneuropathy, unspecified: Secondary | ICD-10-CM | POA: Diagnosis not present

## 2019-04-24 DIAGNOSIS — K219 Gastro-esophageal reflux disease without esophagitis: Secondary | ICD-10-CM | POA: Diagnosis not present

## 2019-04-24 DIAGNOSIS — M069 Rheumatoid arthritis, unspecified: Secondary | ICD-10-CM | POA: Diagnosis not present

## 2019-04-24 DIAGNOSIS — I10 Essential (primary) hypertension: Secondary | ICD-10-CM | POA: Diagnosis not present

## 2019-05-01 DIAGNOSIS — S72009A Fracture of unspecified part of neck of unspecified femur, initial encounter for closed fracture: Secondary | ICD-10-CM | POA: Diagnosis not present

## 2019-05-05 DIAGNOSIS — D529 Folate deficiency anemia, unspecified: Secondary | ICD-10-CM | POA: Diagnosis not present

## 2019-05-05 DIAGNOSIS — F0391 Unspecified dementia with behavioral disturbance: Secondary | ICD-10-CM | POA: Diagnosis not present

## 2019-05-05 DIAGNOSIS — E440 Moderate protein-calorie malnutrition: Secondary | ICD-10-CM | POA: Diagnosis not present

## 2019-05-08 ENCOUNTER — Other Ambulatory Visit: Payer: Self-pay

## 2019-05-08 NOTE — Patient Outreach (Signed)
[facility]) Care Management  05/08/2019  TANDRA LUCKY [birth year] [id number]   Social work referral recieved from [employer] with [place] on 05/08/19.  Referral stated that patient fell at home and sistained a left hip fracture, surgery on 04/19/19. Patient was discharged home with family support and home health on 8/20. Patient has advanced dementia and is essentially bed bound. Family has been staying with the patient 24/7. [name] (daughter/HCPOA) is the primary contact. Family is interested in caregiving resources & support. Daughter states that the patient does not qualify for Medicaid and they have limited financial means to pay for private caregivers. Daughter also reported that the family may consider Palliative Care in the future. Please contact daughter at [phone] (cell) [phone] (home) or email [email or web address]. Unsuccessful outreach to daughter today.  Left message. Will attempt to reach again within four business days.  Ronn Melena, BSW Social Worker [phone]

## 2019-05-08 NOTE — Patient Outreach (Signed)
[facility]) Care Management  05/08/2019  SHERICKA SWAYZE 08/13/1941 [id number]   Social work referral recieved from [employer] with [place] on 05/08/19.  Referral stated that patient fell at home and sistained a left hip fracture, surgery on 04/19/19. Patient was discharged home with family support and home health on 8/20. Patient has advanced dementia and is essentially bed bound. Family has been staying with the patient 24/7. [name] (daughter/HCPOA) is the primary contact. Family is interested in caregiving resources & support. Daughter states that the patient does not qualify for Medicaid and they have limited financial means to pay for private caregivers. Daughter also reported that the family may consider Palliative Care in the future.    Received return call from daughter in response to message left earlier today.  Daughter reported that she is primary caregiver for patient with some minimal support from her brother/patient's son.  Daughter is a Marine scientist that  provides home health services two days per week.  She is aware that in-home aide services are not covered by Medicare. Although patient is under the income limit for Medicaid, they have not applied because she owns a farm and three houses which would likely impact eligibility.  Daughter also reports that her brother has reservations about releasing financial information. The family has had some conversation about selling property in order to assist with finances. BSW previously sent resources for in-home and respite providers to daughter as patient was referred back in January for same reason.  Daughter still has information on the following: Aging Disability and [place] [facility] Directory. Also discussed palliative care and daughter was encouraged to speak with agency that provides services to get better understanding of how this may be of benefit to  patient. Closing case at this due to resources being provided.  Encouraged daughter to call if additional needs arise.  Ronn Melena, BSW Social Worker [phone]

## 2019-05-10 DIAGNOSIS — K219 Gastro-esophageal reflux disease without esophagitis: Secondary | ICD-10-CM | POA: Diagnosis not present

## 2019-05-10 DIAGNOSIS — S72002D Fracture of unspecified part of neck of left femur, subsequent encounter for closed fracture with routine healing: Secondary | ICD-10-CM | POA: Diagnosis not present

## 2019-05-10 DIAGNOSIS — F419 Anxiety disorder, unspecified: Secondary | ICD-10-CM | POA: Diagnosis not present

## 2019-05-10 DIAGNOSIS — I10 Essential (primary) hypertension: Secondary | ICD-10-CM | POA: Diagnosis not present

## 2019-05-10 DIAGNOSIS — E039 Hypothyroidism, unspecified: Secondary | ICD-10-CM | POA: Diagnosis not present

## 2019-05-10 DIAGNOSIS — Z853 Personal history of malignant neoplasm of breast: Secondary | ICD-10-CM | POA: Diagnosis not present

## 2019-05-10 DIAGNOSIS — M810 Age-related osteoporosis without current pathological fracture: Secondary | ICD-10-CM | POA: Diagnosis not present

## 2019-05-10 DIAGNOSIS — D62 Acute posthemorrhagic anemia: Secondary | ICD-10-CM | POA: Diagnosis not present

## 2019-05-10 DIAGNOSIS — F0390 Unspecified dementia without behavioral disturbance: Secondary | ICD-10-CM | POA: Diagnosis not present

## 2019-05-10 DIAGNOSIS — Z8673 Personal history of transient ischemic attack (TIA), and cerebral infarction without residual deficits: Secondary | ICD-10-CM | POA: Diagnosis not present

## 2019-05-10 DIAGNOSIS — Z96642 Presence of left artificial hip joint: Secondary | ICD-10-CM | POA: Diagnosis not present

## 2019-05-10 DIAGNOSIS — D696 Thrombocytopenia, unspecified: Secondary | ICD-10-CM | POA: Diagnosis not present

## 2019-05-10 DIAGNOSIS — F329 Major depressive disorder, single episode, unspecified: Secondary | ICD-10-CM | POA: Diagnosis not present

## 2019-05-10 DIAGNOSIS — G629 Polyneuropathy, unspecified: Secondary | ICD-10-CM | POA: Diagnosis not present

## 2019-05-10 DIAGNOSIS — N289 Disorder of kidney and ureter, unspecified: Secondary | ICD-10-CM | POA: Diagnosis not present

## 2019-05-10 DIAGNOSIS — M069 Rheumatoid arthritis, unspecified: Secondary | ICD-10-CM | POA: Diagnosis not present

## 2019-05-12 NOTE — Op Note (Signed)
04/19/2019  4:09 PM  PATIENT:  [name]  78 y.o. female  PRE-OPERATIVE DIAGNOSIS:  LEFT FEMORAL NECK FRACTURE  POST-OPERATIVE DIAGNOSIS:  LEFT FEMORAL NECK FRACTURE  PROCEDURE:  Procedure(s): ARTHROPLASTY UNIPOLAR HIP (HEMIARTHROPLASTY) (Left) WITH [facility] #3 FEMUR  SURGEON:  Surgeon(s) and Role:    Altamese Luyando, MD - Primary  PHYSICIAN ASSISTANT: Ainsley Spinner, PA-C  ANESTHESIA:   general  I/O:  No intake/output data recorded.  SPECIMEN:  No Specimen  TOURNIQUET:  * No tourniquets in log *  DICTATION: .Note written in EPIC     BRIEF SUMMARY OF INDICATION FOR PROCEDURE:  [name] is a very pleasant 78 y.o. with dementia, who sustained an unwitnessed fall producing inability to bear weight, shortening, and external rotation of the extremity.  She was seen and evaluated with the recommendation for hemiarthroplasty. I discussed with the daughter the risks and benefits, inclding the potential for leg length inequality, dislocation or instability, arthritis, loss of motion, DVT, PE, heart attack, stroke, and death.  Consent was given to proceed.  BRIEF SUMMARY OF PROCEDURE:  The patient was taken to the operating room where general anesthesia was induced and after administration of preoperative antibiotics consisting of 2 g of Ancef.  She was positioned with the left side up and all prominences were padded appropriately.  We made a 10 cm incision after the time-out, carrying dissection down to the IT band, was split in line with the skin.  Cerebellar retractor was placed and we were able to then flex and internally rotate the hip releasing the piriformis and short rotators at their insertions.  The capsule was then T'd, tagging the corners with #1 Vicryl.  The neck cut was refined using a cutting guide and then this was followed by removal of the head, which sized perfectly. Acetabular trials were placed, confirming this size as the best fit.  Mueller and Cobra retractors were placed along the proximal femur, which was then prepared with the canal finder, then lateralizer, followed by reamers up to #3, and the broaches, achieving  outstanding fit and fill with the #3 broach.  The calcar reamer was used to refine the cut as we were using a low demand stem.  The canal was irrigated thoroughly and the acetabulum once again searched multiple times for fragments and irrigated thoroughly.  Trial components were placed and the patient had outstanding stability in combine 90 degrees of flexion, adduction, and internal rotation as well as in external rotation and extension.  Consequently, actual components were placed.  My assistant Ainsley Spinner, was necessary for delivery and control of the proximal femur during preparation, also during relocation and dislocation of the trial components as well as relocation of the actual components.  He assisted me with wound closure as well.  I did repair the capsule with #1 Vicryl and then used #2 FiberWire through bone tunnels to repair the short rotators and piriformis.  This was followed by a #1 Vicryl for the IT band and lastly 2-0 Vicryl and nylon for the subcutaneous and skin.  Sterile gently compressive dressing was applied.  The patient was awakened from anesthesia and transported to the PACU in stable condition.  PROGNOSIS:  The patient will be weightbearing as tolerated with posterior hip precautions.  Patient has an elevated risk of complications related to dementia, declining overall health and mobility.  [name] remains on the Medical Service and will be on DVT prophylaxis mechanically and resume Plavix  but may discontinue if deemed not a good candidate for long-term prophylaxis given the dementia.     Astrid Divine. Marcelino Scot, M.D.

## 2019-05-13 ENCOUNTER — Ambulatory Visit: Payer: PPO

## 2019-05-17 DIAGNOSIS — N390 Urinary tract infection, site not specified: Secondary | ICD-10-CM | POA: Diagnosis not present

## 2019-05-19 DIAGNOSIS — S72031D Displaced midcervical fracture of right femur, subsequent encounter for closed fracture with routine healing: Secondary | ICD-10-CM | POA: Diagnosis not present

## 2019-05-28 ENCOUNTER — Other Ambulatory Visit: Payer: Self-pay

## 2019-05-28 ENCOUNTER — Emergency Department (HOSPITAL_COMMUNITY)
Admission: EM | Admit: 2019-05-28 | Discharge: 2019-05-28 | Disposition: A | Discharge status: Home / Self Care | Payer: PPO | Attending: Emergency Medicine | Admitting: Emergency Medicine

## 2019-05-28 ENCOUNTER — Emergency Department (HOSPITAL_COMMUNITY): Payer: PPO

## 2019-05-28 ENCOUNTER — Encounter (HOSPITAL_COMMUNITY): Payer: Self-pay | Admitting: Emergency Medicine

## 2019-05-28 DIAGNOSIS — Y792 Prosthetic and other implants, materials and accessory orthopedic devices associated with adverse incidents: Secondary | ICD-10-CM | POA: Diagnosis not present

## 2019-05-28 DIAGNOSIS — Z79899 Other long term (current) drug therapy: Secondary | ICD-10-CM | POA: Diagnosis not present

## 2019-05-28 DIAGNOSIS — Z85850 Personal history of malignant neoplasm of thyroid: Secondary | ICD-10-CM | POA: Diagnosis not present

## 2019-05-28 DIAGNOSIS — F0390 Unspecified dementia without behavioral disturbance: Secondary | ICD-10-CM | POA: Insufficient documentation

## 2019-05-28 DIAGNOSIS — S79912A Unspecified injury of left hip, initial encounter: Secondary | ICD-10-CM | POA: Diagnosis present

## 2019-05-28 DIAGNOSIS — R404 Transient alteration of awareness: Secondary | ICD-10-CM | POA: Diagnosis not present

## 2019-05-28 DIAGNOSIS — Y929 Unspecified place or not applicable: Secondary | ICD-10-CM | POA: Diagnosis not present

## 2019-05-28 DIAGNOSIS — Z853 Personal history of malignant neoplasm of breast: Secondary | ICD-10-CM | POA: Diagnosis not present

## 2019-05-28 DIAGNOSIS — Z7401 Bed confinement status: Secondary | ICD-10-CM | POA: Diagnosis not present

## 2019-05-28 DIAGNOSIS — Y999 Unspecified external cause status: Secondary | ICD-10-CM | POA: Diagnosis not present

## 2019-05-28 DIAGNOSIS — E039 Hypothyroidism, unspecified: Secondary | ICD-10-CM | POA: Diagnosis not present

## 2019-05-28 DIAGNOSIS — R52 Pain, unspecified: Secondary | ICD-10-CM | POA: Diagnosis not present

## 2019-05-28 DIAGNOSIS — T84021A Dislocation of internal left hip prosthesis, initial encounter: Secondary | ICD-10-CM | POA: Diagnosis not present

## 2019-05-28 DIAGNOSIS — Y939 Activity, unspecified: Secondary | ICD-10-CM | POA: Diagnosis not present

## 2019-05-28 DIAGNOSIS — R402411 Glasgow coma scale score 13-15, in the field [EMT or ambulance]: Secondary | ICD-10-CM | POA: Diagnosis not present

## 2019-05-28 DIAGNOSIS — Z8673 Personal history of transient ischemic attack (TIA), and cerebral infarction without residual deficits: Secondary | ICD-10-CM | POA: Diagnosis not present

## 2019-05-28 DIAGNOSIS — Z9889 Other specified postprocedural states: Secondary | ICD-10-CM

## 2019-05-28 DIAGNOSIS — S73005A Unspecified dislocation of left hip, initial encounter: Secondary | ICD-10-CM

## 2019-05-28 DIAGNOSIS — R5381 Other malaise: Secondary | ICD-10-CM | POA: Diagnosis not present

## 2019-05-28 DIAGNOSIS — Z7902 Long term (current) use of antithrombotics/antiplatelets: Secondary | ICD-10-CM | POA: Diagnosis not present

## 2019-05-28 DIAGNOSIS — R0902 Hypoxemia: Secondary | ICD-10-CM | POA: Diagnosis not present

## 2019-05-28 DIAGNOSIS — Z96642 Presence of left artificial hip joint: Secondary | ICD-10-CM | POA: Diagnosis not present

## 2019-05-28 MED ORDER — FENTANYL CITRATE (PF) 100 MCG/2ML IJ SOLN
50.0000 ug | Freq: Once | INTRAMUSCULAR | Status: AC
  Administered 2019-05-28: 50 ug via INTRAVENOUS
  Filled 2019-05-28: qty 2

## 2019-05-28 MED ORDER — PROPOFOL 10 MG/ML IV BOLUS
0.5000 mg/kg | Freq: Once | INTRAVENOUS | Status: DC
  Filled 2019-05-28: qty 20

## 2019-05-28 MED ORDER — PROPOFOL 10 MG/ML IV BOLUS
INTRAVENOUS | Status: AC | PRN
  Administered 2019-05-28: 30 mg via INTRAVENOUS

## 2019-05-28 NOTE — ED Triage Notes (Signed)
Per EMS patient's daughter called due to hip pain. Patient has had hip surgery on left hip 5 weeks ago. Pt complains of left hip pain x 5 weeks stating that she can not move without pain. Patient refuses to move left leg.

## 2019-05-28 NOTE — ED Notes (Signed)
Per C com, [facility] is at [place] and will be here next to take pt home   Pt and daughter informed

## 2019-05-28 NOTE — ED Notes (Signed)
Patient transported to X-ray 

## 2019-05-28 NOTE — ED Notes (Signed)
Awaiting transport home

## 2019-05-28 NOTE — ED Provider Notes (Signed)
[facility] EMERGENCY DEPARTMENT Provider Note   CSN: [id number] Arrival date & time: 05/28/19  1021     History   Chief Complaint Chief Complaint  Patient presents with   Hip Pain    HPI [name] is a 78 y.o. female.     HPI   78yF with L hip pain and deformity. S/p L hip arthroplasty on 04/19/19 by Dr Marcelino Scot. Pt has been wearing knee immobilizer but in past week her daughter has been taking it off at night. Pt picks/pulls on it all the time and daughter noticed areas of chaffing. This morning she woke up with L leg positioned awkwardly and in pain with attempts to move hip. No trauma. Pt with advanced dementia and otherwise seems to be at her baseline.   Past Medical History:  Diagnosis Date   Anxiety    Arm fracture, left    Breast cancer (HCC)    Bulging disc    Cardioembolic stroke ([place]) 123456   Confusion    Depression    Difficulty swallowing solids    GERD (gastroesophageal reflux disease)    Hypertension    Hypothyroidism    Invasive ductal carcinoma of left breast ([place]) 02/24/2014   Neuropathy    Osteoporosis    Rheumatoid arteritis (HCC)    RA   Rotator cuff disorder, right    Scoliosis    Shortness of breath dyspnea    "I have reactive airway disease".    Patient Active Problem List   Diagnosis Date Noted   Femoral neck fracture ([place]) 04/19/2019   History of stroke [id number]   Cardioembolic stroke ([place]) 123456   Acute cardioembolic stroke ([place]) 123456   Acute colitis 08/01/2018   Hypothyroidism 08/01/2018   Hyponatremia 08/01/2018   Hypokalemia 08/01/2018   Infarction of right basal ganglia ([place]) 08/01/2018   Hypertension 08/01/2018   Dementia ([place]) 08/01/2018   Melena 05/07/2018   Esophageal dysphagia 05/07/2018   Osteoporosis with pathological fracture of forearm 10/03/2016   Lumbar radiculopathy 03/13/2016   Invasive ductal carcinoma of left breast ([place]) 02/24/2014   Neoplasm of  uncertain behavior of thyroid gland, 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   Multiple thyroid nodules 06/03/2013   Rheumatoid arthritis ([place]) 07/15/2008    Past Surgical History:  Procedure Laterality Date   ABDOMINAL HYSTERECTOMY     AIKEN OSTEOTOMY Right 10/28/2014   Procedure: Barbie Banner OSTEOTOMY RIGHT FOOT;  Surgeon: Marcheta Grammes, DPM;  Location: [facility];  Service: Podiatry;  Laterality: Right;   BIOPSY  05/15/2018   Procedure: BIOPSY;  Surgeon: Rogene Houston, MD;  Location: [facility];  Service: Endoscopy;;  gastric   BONE BIOPSY  09/29/2015   Procedure: BONE BIOPSY AND BONE CULTURE SECOND TOE RIGHT FOOT;  Surgeon: Caprice Beaver, DPM;  Location: [facility];  Service: Podiatry;;   BREAST SURGERY Left    lumpectomy and axillary lymph node with re-excision 2 weeks later    BUNIONECTOMY Right 10/28/2014   Procedure: SILVER BUNIONECTOMY RIGHT FOOT;  Surgeon: Marcheta Grammes, DPM;  Location: [facility];  Service: Podiatry;  Laterality: Right;   CATARACT EXTRACTION W/PHACO Left 08/10/2017   Procedure: CATARACT EXTRACTION PHACO AND INTRAOCULAR LENS PLACEMENT LEFT EYE;  Surgeon: Baruch Goldmann, MD;  Location: [facility];  Service: Ophthalmology;  Laterality: Left;  CDE: 13.81   CATARACT EXTRACTION W/PHACO Right 09/07/2017   Procedure: CATARACT EXTRACTION PHACO AND INTRAOCULAR LENS PLACEMENT RIGHT EYE;  Surgeon: Baruch Goldmann, MD;  Location: [facility];  Service: Ophthalmology;  Laterality: Right;  right   COLONOSCOPY     DILATION AND CURETTAGE OF UTERUS     ESOPHAGEAL DILATION N/A 05/15/2018   Procedure: ESOPHAGEAL DILATION;  Surgeon: Rogene Houston, MD;  [facility];  Service: Endoscopy;  Laterality: N/A;   esophageal stricture     ESOPHAGOGASTRODUODENOSCOPY N/A 05/15/2018   Procedure: ESOPHAGOGASTRODUODENOSCOPY (EGD);  Surgeon: Rogene Houston, MD;  [facility]: [facility];  Service: Endoscopy;  Laterality: N/A;  1:45   HAMMER TOE SURGERY Right 10/28/2014   Procedure: HAMMER  TOE CORRECTION 2ND AND 3RD TOE RIGHT FOOT;  Surgeon: Marcheta Grammes, DPM;  Location: [facility];  Service: Podiatry;  Laterality: Right;   HIP ARTHROPLASTY Left 04/19/2019   Procedure: ARTHROPLASTY BIPOLAR HIP (HEMIARTHROPLASTY);  Surgeon: Altamese Cloverdale, MD;  Location: [facility];  Service: Orthopedics;  Laterality: Left;   LUMBAR LAMINECTOMY/DECOMPRESSION MICRODISCECTOMY Left 03/13/2016   Procedure: Left Lumbar Four-Five, Lumbar Five-Sacral One Laminotomy/Foraminotomy;  Surgeon: Kevan Ny Ditty, MD;  [facility];  Service: Neurosurgery;  Laterality: Left;   REMOVAL OF IMPLANT Right 09/29/2015   Procedure: REMOVAL OF IMPLANT 2ND TOE RIGHT FOOT;  Surgeon: Caprice Beaver, DPM;  Location: [facility];  Service: Podiatry;  Laterality: Right;   TEE WITHOUT CARDIOVERSION N/A 08/05/2018   Procedure: TRANSESOPHAGEAL ECHOCARDIOGRAM (TEE);  Surgeon: Skeet Latch, MD;  Location: [facility] ENDOSCOPY;  Service: Cardiovascular;  Laterality: N/A;     OB History    Gravida  2   Para  2   Term  2   Preterm      AB      Living        SAB      TAB      Ectopic      Multiple      Live Births               Home Medications    Prior to Admission medications   Medication Sig Start Date End Date Taking? Authorizing Provider  acetaminophen (TYLENOL) 325 MG tablet Take 500 mg by mouth every 6 (six) hours as needed for mild pain or moderate pain.   Yes [provider]  albuterol (PROAIR HFA) 108 (90 Base) MCG/ACT inhaler Inhale 2 puffs into the lungs every 6 (six) hours as needed for wheezing or shortness of breath.    Yes [provider]  ALPRAZolam (XANAX) 0.25 MG tablet Take 0.25-50 mg by mouth 3 (three) times daily as needed for anxiety.  01/26/19  Yes [provider]  Calcium Carbonate-Vitamin D (CALTRATE 600+D) 600-400 MG-UNIT tablet Take 1 tablet by mouth 2 (two) times daily.   Yes [provider]  cetirizine (ZYRTEC) 10 MG tablet Take 10 mg  by mouth daily as needed for allergies.    Yes [provider]  Cholecalciferol (VITAMIN D) 2000 UNITS CAPS Take 2,000 Units by mouth daily.    Yes [provider]  clopidogrel (PLAVIX) 75 MG tablet Take 1 tablet (75 mg total) by mouth daily. 08/07/18  Yes Shelly Coss, MD  Cranberry 125 MG TABS Take 1 tablet by mouth 3 (three) times daily as needed.   Yes [provider]  denosumab (PROLIA) 60 MG/ML SOLN injection Inject 60 mg into the skin every 6 (six) months. Administer in upper arm, thigh, or abdomen, got in June 2019   Yes [provider]  donepezil (ARICEPT) 10 MG tablet Take 1 tablet (10 mg total) by mouth at bedtime. 10/08/18  Yes Garvin Fila, MD  DULoxetine (CYMBALTA)  60 MG capsule Take 60 mg by mouth daily.   Yes [provider]  folic acid (FOLVITE) Q000111Q MCG tablet Take 800 mcg by mouth daily.   Yes [provider]  HYDROcodone-acetaminophen (NORCO/VICODIN) 5-325 MG tablet Take 1 tablet by mouth every 6 (six) hours as needed for moderate pain or severe pain. 04/23/19  Yes Bonnielee Haff, MD  iron polysaccharides (NIFEREX) 150 MG capsule Take 1 capsule (150 mg total) by mouth daily. 04/24/19  Yes Bonnielee Haff, MD  lisinopril (PRINIVIL,ZESTRIL) 10 MG tablet Take 5 mg by mouth daily as needed (for blood pressure). Son says she takes it as needed when she feels her BP is high, she will check it and then take it if needed.   Yes [provider]  Magnesium 500 MG CAPS Take 500 mg by mouth daily.    Yes [provider]  memantine (NAMENDA) 10 MG tablet Take 5 mg by mouth 2 (two) times daily.    Yes [provider]  methotrexate (RHEUMATREX) 2.5 MG tablet Take 15 mg by mouth once a week. Every Monday 08/31/15  Yes [provider]  MYRBETRIQ 25 MG TB24 tablet Take 25 mg by mouth daily.  04/05/19  Yes [provider]  omeprazole (PRILOSEC) 20 MG capsule Take 20 mg by mouth 2 (two) times daily before  a meal.   Yes [provider]  potassium chloride (K-DUR) 10 MEQ tablet Take 10 mEq by mouth at bedtime.   Yes [provider]  vitamin B-12 1000 MCG tablet Take 1 tablet (1,000 mcg total) by mouth daily. 04/24/19  Yes Bonnielee Haff, MD  Zinc 50 MG CAPS Take 50 mg by mouth daily.    Yes [provider]    Family History Family History  Problem Relation Age of Onset   Hypertension Mother    Cancer Maternal Grandmother    Cancer Cousin     Social History Social History   Tobacco Use   Smoking status: Never Smoker   Smokeless tobacco: Never Used  Substance Use Topics   Alcohol use: No   Drug use: No     Allergies   Celecoxib, Ibuprofen, Diphenhydramine, and Wygesic [propoxyphene n-acetaminophen]   Review of Systems Review of Systems  All systems reviewed and negative, other than as noted in HPI.  Physical Exam Updated Vital Signs BP (!) 164/77    Pulse 75    Temp 98.5 F (36.9 C) (Oral)    Resp (!) 21    Wt 59.9 kg    SpO2 98%    BMI 25.79 kg/m   Physical Exam Vitals signs and nursing note reviewed.  Constitutional:      General: She is not in acute distress.    Appearance: She is well-developed.  HENT:     Head: Normocephalic and atraumatic.  Eyes:     General:        Right eye: No discharge.        Left eye: No discharge.     Conjunctiva/sclera: Conjunctivae normal.  Neck:     Musculoskeletal: Neck supple.  Cardiovascular:     Rate and Rhythm: Normal rate and regular rhythm.     Heart sounds: Normal heart sounds. No murmur. No friction rub. No gallop.   Pulmonary:     Effort: Pulmonary effort is normal. No respiratory distress.     Breath sounds: Normal breath sounds.  Abdominal:     General: There is no distension.     Palpations: Abdomen  is soft.     Tenderness: There is no abdominal tenderness.  Musculoskeletal:        General: No tenderness.     Comments: LLE held with L hip and knee flexed. Grimaces in pain with  attempts to passively range hip. Surgical incision appears to be healing w/o complication  Skin:    General: Skin is warm and dry.  Neurological:     Mental Status: She is alert.      ED Treatments / Results  Labs (all labs ordered are listed, but only abnormal results are displayed) Labs Reviewed - No data to display  EKG None  Radiology Dg Hip Unilat W Or Wo Pelvis 2-3 Views Left  Result Date: 05/28/2019 CLINICAL DATA:  Acute left hip pain without known injury. EXAM: DG HIP (WITH OR WITHOUT PELVIS) 2-3V LEFT COMPARISON:  Radiographs of April 19, 2019. FINDINGS: There is superior and posterior dislocation of the left hip prosthesis relative to the left acetabulum. No fracture is noted. Right hip is unremarkable. IMPRESSION: Superior and posterior dislocation of left hip prosthesis. Electronically Signed   By: Marijo Conception M.D.   On: 05/28/2019 12:10    Procedures Reduction of dislocation  Date/Time: 05/28/2019 1:30 PM Performed by: Virgel Manifold, MD Authorized by: Virgel Manifold, MD  Consent: Verbal consent obtained. Written consent obtained. Risks and benefits: risks, benefits and alternatives were discussed Consent given by: daughter. Imaging studies: imaging studies available Required items: required blood products, implants, devices, and special equipment available Patient identity confirmed: arm band and provided demographic data Time out: Immediately prior to procedure a "time out" was called to verify the correct patient, procedure, equipment, support staff and site/side marked as required. Local anesthesia used: no  Anesthesia: Local anesthesia used: no  Sedation: Patient sedated: yes Sedation type: moderate (conscious) sedation Sedatives: propofol Analgesia: fentanyl Vitals: Vital signs were monitored during sedation.  Patient tolerance: patient tolerated the procedure well with no immediate complications  .Sedation  Date/Time: 05/28/2019 1:30  PM Performed by: Virgel Manifold, MD Authorized by: Virgel Manifold, MD   Consent:    Consent obtained:  Verbal   Consent given by:  Patient   Risks discussed:  Allergic reaction, dysrhythmia, inadequate sedation, nausea, prolonged hypoxia resulting in organ damage, prolonged sedation necessitating reversal, respiratory compromise necessitating ventilatory assistance and intubation and vomiting   Alternatives discussed:  Analgesia without sedation, anxiolysis and regional anesthesia Universal protocol:    Procedure explained and questions answered to patient or proxy's satisfaction: yes     Relevant documents present and verified: yes     Test results available and properly labeled: yes     Imaging studies available: yes     Required blood products, implants, devices, and special equipment available: yes     Site/side marked: yes     Immediately prior to procedure a time out was called: yes     Patient identity confirmation method:  Provided demographic data and arm band Indications:    Procedure performed:  Dislocation reduction   Procedure necessitating sedation performed by:  Physician performing sedation Pre-sedation assessment:    Time since last food or drink:  .   ASA classification: class 3 - patient with severe systemic disease     Neck mobility: normal     Mouth opening:  2 finger widths   Thyromental distance:  3 finger widths   Mallampati score:  II - soft palate, uvula, fauces visible   Pre-sedation assessments completed and reviewed: airway patency, cardiovascular  function, hydration status, mental status, nausea/vomiting, pain level, respiratory function and temperature   Immediate pre-procedure details:    Reassessment: Patient reassessed immediately prior to procedure     Reviewed: vital signs, relevant labs/tests and NPO status     Verified: bag valve mask available, emergency equipment available, intubation equipment available, IV patency confirmed, oxygen available  and suction available   Procedure details (see MAR for exact dosages):    Preoxygenation:  Nasal cannula   Sedation:  Propofol   Analgesia:  Fentanyl   Intra-procedure monitoring:  Blood pressure monitoring, cardiac monitor, continuous pulse oximetry, frequent LOC assessments, frequent vital sign checks and continuous capnometry   Intra-procedure events: none     Total Provider sedation time (minutes):  30 Post-procedure details:    Attendance: Constant attendance by certified staff until patient recovered     Recovery: Patient returned to pre-procedure baseline     Post-sedation assessments completed and reviewed: airway patency, cardiovascular function, hydration status, mental status, nausea/vomiting, pain level, respiratory function and temperature     Patient is stable for discharge or admission: yes     Patient tolerance:  Tolerated well, no immediate complications   (including critical care time)  Medications Ordered in ED Medications  propofol (DIPRIVAN) 10 mg/mL bolus/IV push 30 mg (has no administration in time range)  propofol (DIPRIVAN) 10 mg/mL bolus/IV push (30 mg Intravenous Given 05/28/19 1326)  fentaNYL (SUBLIMAZE) injection 50 mcg (50 mcg Intravenous Given 05/28/19 1310)     Initial Impression / Assessment and Plan / ED Course  I have reviewed the triage vital signs and the nursing notes.  Pertinent labs & imaging results that were available during my care of the patient were reviewed by me and considered in my medical decision making (see chart for details).        78yF with L hip dislocation. Reduced. Knee immobilizer. Ortho FU.   Final Clinical Impressions(s) / ED Diagnoses   Final diagnoses:  S/P closed reduction of dislocated total hip prosthesis    ED Discharge Orders    None       Virgel Manifold, MD 06/01/19 [phone]

## 2019-05-28 NOTE — Discharge Instructions (Signed)
Try to wear knee immobilizer at all times until you can follow-up with Dr Marcelino Scot.

## 2019-06-01 DIAGNOSIS — S72009A Fracture of unspecified part of neck of unspecified femur, initial encounter for closed fracture: Secondary | ICD-10-CM | POA: Diagnosis not present

## 2019-06-03 ENCOUNTER — Encounter (HOSPITAL_COMMUNITY): Payer: Self-pay | Admitting: Emergency Medicine

## 2019-06-03 ENCOUNTER — Emergency Department (HOSPITAL_COMMUNITY): Payer: PPO

## 2019-06-03 ENCOUNTER — Other Ambulatory Visit: Payer: Self-pay

## 2019-06-03 ENCOUNTER — Inpatient Hospital Stay (HOSPITAL_COMMUNITY)
Admission: EM | Admit: 2019-06-03 | Discharge: 2019-06-12 | DRG: 467 | Disposition: A | Discharge status: Skilled Nursing Facility | Payer: PPO | Attending: Internal Medicine | Admitting: Internal Medicine

## 2019-06-03 DIAGNOSIS — Z7401 Bed confinement status: Secondary | ICD-10-CM

## 2019-06-03 DIAGNOSIS — Z8249 Family history of ischemic heart disease and other diseases of the circulatory system: Secondary | ICD-10-CM | POA: Diagnosis not present

## 2019-06-03 DIAGNOSIS — M069 Rheumatoid arthritis, unspecified: Secondary | ICD-10-CM | POA: Diagnosis present

## 2019-06-03 DIAGNOSIS — Z8673 Personal history of transient ischemic attack (TIA), and cerebral infarction without residual deficits: Secondary | ICD-10-CM

## 2019-06-03 DIAGNOSIS — M2550 Pain in unspecified joint: Secondary | ICD-10-CM | POA: Diagnosis not present

## 2019-06-03 DIAGNOSIS — S73005A Unspecified dislocation of left hip, initial encounter: Secondary | ICD-10-CM | POA: Insufficient documentation

## 2019-06-03 DIAGNOSIS — I160 Hypertensive urgency: Secondary | ICD-10-CM | POA: Diagnosis not present

## 2019-06-03 DIAGNOSIS — Z809 Family history of malignant neoplasm, unspecified: Secondary | ICD-10-CM | POA: Diagnosis not present

## 2019-06-03 DIAGNOSIS — R5381 Other malaise: Secondary | ICD-10-CM | POA: Diagnosis not present

## 2019-06-03 DIAGNOSIS — M24452 Recurrent dislocation, left hip: Secondary | ICD-10-CM | POA: Diagnosis not present

## 2019-06-03 DIAGNOSIS — M419 Scoliosis, unspecified: Secondary | ICD-10-CM | POA: Diagnosis not present

## 2019-06-03 DIAGNOSIS — R41841 Cognitive communication deficit: Secondary | ICD-10-CM | POA: Diagnosis not present

## 2019-06-03 DIAGNOSIS — Z03818 Encounter for observation for suspected exposure to other biological agents ruled out: Secondary | ICD-10-CM | POA: Diagnosis not present

## 2019-06-03 DIAGNOSIS — T84021A Dislocation of internal left hip prosthesis, initial encounter: Secondary | ICD-10-CM | POA: Diagnosis not present

## 2019-06-03 DIAGNOSIS — Y792 Prosthetic and other implants, materials and accessory orthopedic devices associated with adverse incidents: Secondary | ICD-10-CM | POA: Diagnosis present

## 2019-06-03 DIAGNOSIS — D539 Nutritional anemia, unspecified: Secondary | ICD-10-CM | POA: Diagnosis not present

## 2019-06-03 DIAGNOSIS — M810 Age-related osteoporosis without current pathological fracture: Secondary | ICD-10-CM | POA: Diagnosis not present

## 2019-06-03 DIAGNOSIS — R0902 Hypoxemia: Secondary | ICD-10-CM | POA: Diagnosis not present

## 2019-06-03 DIAGNOSIS — Z09 Encounter for follow-up examination after completed treatment for conditions other than malignant neoplasm: Secondary | ICD-10-CM

## 2019-06-03 DIAGNOSIS — E876 Hypokalemia: Secondary | ICD-10-CM | POA: Diagnosis present

## 2019-06-03 DIAGNOSIS — M25352 Other instability, left hip: Secondary | ICD-10-CM | POA: Diagnosis not present

## 2019-06-03 DIAGNOSIS — E039 Hypothyroidism, unspecified: Secondary | ICD-10-CM | POA: Diagnosis present

## 2019-06-03 DIAGNOSIS — F0390 Unspecified dementia without behavioral disturbance: Secondary | ICD-10-CM | POA: Diagnosis not present

## 2019-06-03 DIAGNOSIS — R2689 Other abnormalities of gait and mobility: Secondary | ICD-10-CM | POA: Diagnosis not present

## 2019-06-03 DIAGNOSIS — F329 Major depressive disorder, single episode, unspecified: Secondary | ICD-10-CM | POA: Diagnosis not present

## 2019-06-03 DIAGNOSIS — J45909 Unspecified asthma, uncomplicated: Secondary | ICD-10-CM | POA: Diagnosis present

## 2019-06-03 DIAGNOSIS — Z20828 Contact with and (suspected) exposure to other viral communicable diseases: Secondary | ICD-10-CM | POA: Diagnosis present

## 2019-06-03 DIAGNOSIS — Z7902 Long term (current) use of antithrombotics/antiplatelets: Secondary | ICD-10-CM | POA: Diagnosis not present

## 2019-06-03 DIAGNOSIS — R52 Pain, unspecified: Secondary | ICD-10-CM | POA: Diagnosis not present

## 2019-06-03 DIAGNOSIS — Z853 Personal history of malignant neoplasm of breast: Secondary | ICD-10-CM

## 2019-06-03 DIAGNOSIS — Z90710 Acquired absence of both cervix and uterus: Secondary | ICD-10-CM

## 2019-06-03 DIAGNOSIS — Z79899 Other long term (current) drug therapy: Secondary | ICD-10-CM

## 2019-06-03 DIAGNOSIS — Z471 Aftercare following joint replacement surgery: Secondary | ICD-10-CM | POA: Diagnosis not present

## 2019-06-03 DIAGNOSIS — M6281 Muscle weakness (generalized): Secondary | ICD-10-CM | POA: Diagnosis not present

## 2019-06-03 DIAGNOSIS — Z79891 Long term (current) use of opiate analgesic: Secondary | ICD-10-CM

## 2019-06-03 DIAGNOSIS — F419 Anxiety disorder, unspecified: Secondary | ICD-10-CM | POA: Diagnosis not present

## 2019-06-03 DIAGNOSIS — Z01818 Encounter for other preprocedural examination: Secondary | ICD-10-CM

## 2019-06-03 DIAGNOSIS — F29 Unspecified psychosis not due to a substance or known physiological condition: Secondary | ICD-10-CM | POA: Diagnosis not present

## 2019-06-03 DIAGNOSIS — I10 Essential (primary) hypertension: Secondary | ICD-10-CM | POA: Diagnosis present

## 2019-06-03 DIAGNOSIS — D62 Acute posthemorrhagic anemia: Secondary | ICD-10-CM | POA: Diagnosis not present

## 2019-06-03 DIAGNOSIS — K219 Gastro-esophageal reflux disease without esophagitis: Secondary | ICD-10-CM | POA: Diagnosis present

## 2019-06-03 DIAGNOSIS — R1311 Dysphagia, oral phase: Secondary | ICD-10-CM | POA: Diagnosis not present

## 2019-06-03 DIAGNOSIS — Z419 Encounter for procedure for purposes other than remedying health state, unspecified: Secondary | ICD-10-CM

## 2019-06-03 DIAGNOSIS — Z96642 Presence of left artificial hip joint: Secondary | ICD-10-CM | POA: Diagnosis not present

## 2019-06-03 DIAGNOSIS — R531 Weakness: Secondary | ICD-10-CM | POA: Diagnosis not present

## 2019-06-03 DIAGNOSIS — T8489XA Other specified complication of internal orthopedic prosthetic devices, implants and grafts, initial encounter: Secondary | ICD-10-CM | POA: Diagnosis not present

## 2019-06-03 LAB — COMPREHENSIVE METABOLIC PANEL
ALT: 17 U/L (ref 0–44)
AST: 30 U/L (ref 15–41)
Albumin: 2.7 g/dL — ABNORMAL LOW (ref 3.5–5.0)
Alkaline Phosphatase: 92 U/L (ref 38–126)
Anion gap: 8 (ref 5–15)
BUN: 12 mg/dL (ref 8–23)
CO2: 27 mmol/L (ref 22–32)
Calcium: 9.3 mg/dL (ref 8.9–10.3)
Chloride: 102 mmol/L (ref 98–111)
Creatinine, Ser: 0.73 mg/dL (ref 0.44–1.00)
GFR calc Af Amer: >60 mL/min (ref >60)
GFR calc non Af Amer: >60 mL/min (ref >60)
Glucose, Bld: 112 mg/dL — ABNORMAL HIGH (ref 70–99)
Potassium: 3 mmol/L — ABNORMAL LOW (ref 3.5–5.1)
Sodium: 137 mmol/L (ref 135–145)
Total Bilirubin: 1 mg/dL (ref 0.3–1.2)
Total Protein: 6.1 g/dL — ABNORMAL LOW (ref 6.5–8.1)

## 2019-06-03 LAB — URINALYSIS, ROUTINE W REFLEX MICROSCOPIC
Bilirubin Urine: NEGATIVE
Glucose, UA: NEGATIVE mg/dL
Hgb urine dipstick: NEGATIVE
Ketones, ur: NEGATIVE mg/dL
Nitrite: NEGATIVE
Protein, ur: NEGATIVE mg/dL
Specific Gravity, Urine: 1.013 (ref 1.005–1.030)
pH: 7 (ref 5.0–8.0)

## 2019-06-03 LAB — SARS CORONAVIRUS 2 BY RT PCR (HOSPITAL ORDER, PERFORMED IN ~~LOC~~ HOSPITAL LAB): SARS Coronavirus 2: NEGATIVE

## 2019-06-03 LAB — CBC
HCT: 32 % — ABNORMAL LOW (ref 36.0–46.0)
Hemoglobin: 9.9 g/dL — ABNORMAL LOW (ref 12.0–15.0)
MCH: 31.5 pg (ref 26.0–34.0)
MCHC: 30.9 g/dL (ref 30.0–36.0)
MCV: 101.9 fL — ABNORMAL HIGH (ref 80.0–100.0)
Platelets: 225 10*3/uL (ref 150–400)
RBC: 3.14 MIL/uL — ABNORMAL LOW (ref 3.87–5.11)
RDW: 16.8 % — ABNORMAL HIGH (ref 11.5–15.5)
WBC: 7.3 10*3/uL (ref 4.0–10.5)
nRBC: 0 % (ref 0.0–0.2)

## 2019-06-03 MED ORDER — POTASSIUM CHLORIDE IN NACL 40-0.9 MEQ/L-% IV SOLN
INTRAVENOUS | Status: AC
  Administered 2019-06-04: 02:00:00 75 mL/h via INTRAVENOUS
  Filled 2019-06-03 (×2): qty 1000

## 2019-06-03 MED ORDER — LABETALOL HCL 5 MG/ML IV SOLN
10.0000 mg | INTRAVENOUS | Status: DC | PRN
  Filled 2019-06-03: qty 4

## 2019-06-03 MED ORDER — POTASSIUM CHLORIDE CRYS ER 20 MEQ PO TBCR
20.0000 meq | EXTENDED_RELEASE_TABLET | Freq: Once | ORAL | Status: AC
  Administered 2019-06-03: 23:00:00 20 meq via ORAL
  Filled 2019-06-03: qty 1

## 2019-06-03 MED ORDER — ALPRAZOLAM 0.5 MG PO TABS
0.2500 mg | ORAL_TABLET | Freq: Three times a day (TID) | ORAL | Status: DC | PRN
  Administered 2019-06-06: 0.5 mg via ORAL
  Filled 2019-06-03: qty 1

## 2019-06-03 MED ORDER — ACETAMINOPHEN 160 MG/5ML PO SOLN
650.0000 mg | Freq: Four times a day (QID) | ORAL | Status: DC | PRN
  Administered 2019-06-03 – 2019-06-07 (×2): 650 mg via ORAL
  Filled 2019-06-03 (×2): qty 20.3

## 2019-06-03 MED ORDER — MAGNESIUM SULFATE IN D5W 1-5 GM/100ML-% IV SOLN
1.0000 g | Freq: Once | INTRAVENOUS | Status: AC
  Administered 2019-06-03: 1 g via INTRAVENOUS
  Filled 2019-06-03: qty 100

## 2019-06-03 MED ORDER — POTASSIUM CHLORIDE CRYS ER 20 MEQ PO TBCR: 20.0000 meq | EXTENDED_RELEASE_TABLET | Freq: Two times a day (BID) | ORAL | Status: DC

## 2019-06-03 MED ORDER — PROPOFOL 10 MG/ML IV BOLUS
1.0000 mg/kg | Freq: Once | INTRAVENOUS | Status: AC
  Administered 2019-06-03: 60 mg via INTRAVENOUS
  Filled 2019-06-03: qty 20

## 2019-06-03 MED ORDER — MORPHINE SULFATE (PF) 2 MG/ML IV SOLN
1.0000 mg | INTRAVENOUS | Status: DC | PRN
  Administered 2019-06-03 – 2019-06-04 (×3): 2 mg via INTRAVENOUS
  Administered 2019-06-05 – 2019-06-06 (×4): 1 mg via INTRAVENOUS
  Filled 2019-06-03: qty 1
  Filled 2019-06-03: qty 2
  Filled 2019-06-03 (×5): qty 1

## 2019-06-03 MED ORDER — PROPOFOL 10 MG/ML IV BOLUS: INTRAVENOUS | Status: AC | PRN

## 2019-06-03 MED ORDER — ALBUTEROL SULFATE (2.5 MG/3ML) 0.083% IN NEBU: 3.0000 mL | INHALATION_SOLUTION | Freq: Four times a day (QID) | RESPIRATORY_TRACT | Status: DC | PRN

## 2019-06-03 NOTE — ED Provider Notes (Signed)
Provider Note MRN:  [id number]  Arrival date & time: 06/03/19    ED Course and Medical Decision Making  Assumed care from Dr. Melina Copa at shift change.  Patient was eventually moved to a non-hallway bed.  At that time, we were able to have her fully monitored and attempt dislocation reduction with procedural sedation.  Unfortunately unsuccessful despite multiple attempts.  Daughter thinks that the hip has been dislocated for over 24 hours.  She has been calling the orthopedic office multiple times without answer.  Will consult orthopedics for further guidance.  8:27 PM update: Discussed with Dr. Mardelle Matte orthopedic surgery, who recommends transfer to [facility] for admission by hospitalist service and likely revision of surgery tomorrow morning.  .Sedation  Date/Time: 06/03/2019 7:55 PM Performed by: Maudie Flakes, MD Authorized by: Maudie Flakes, MD   Consent:    Consent obtained:  Written   Consent given by:  Healthcare agent   Risks discussed:  Allergic reaction, inadequate sedation, prolonged hypoxia resulting in organ damage and respiratory compromise necessitating ventilatory assistance and intubation Universal protocol:    Immediately prior to procedure a time out was called: yes     Patient identity confirmation method:  Arm band, hospital-assigned identification number and provided demographic data Indications:    Procedure performed:  Dislocation reduction   Procedure necessitating sedation performed by:  Physician performing sedation Pre-sedation assessment:    Time since last food or drink:  4 hours   ASA classification: class 2 - patient with mild systemic disease     Neck mobility: normal     Mouth opening:  3 or more finger widths   Mallampati score:  I - soft palate, uvula, fauces, pillars visible   Pre-sedation assessments completed and reviewed: airway patency, cardiovascular function and mental status   Immediate pre-procedure details:    Reviewed: vital  signs and relevant labs/tests     Verified: bag valve mask available, emergency equipment available, intubation equipment available, IV patency confirmed, oxygen available and suction available   Procedure details (see MAR for exact dosages):    Preoxygenation:  Nasal cannula   Sedation:  Propofol   Intended level of sedation: deep   Intra-procedure events: none     Total Provider sedation time (minutes):  19 Post-procedure details:    Attendance: Constant attendance by certified staff until patient recovered     Recovery: Patient returned to pre-procedure baseline     Patient is stable for discharge or admission: yes     Patient tolerance:  Tolerated well, no immediate complications Reduction of dislocation  Date/Time: 06/03/2019 7:57 PM Performed by: Maudie Flakes, MD Authorized by: Maudie Flakes, MD  Consent: Written consent obtained. Risks and benefits: risks, benefits and alternatives were discussed Consent given by: guardian Patient understanding: patient states understanding of the procedure being performed Patient consent: the patient's understanding of the procedure matches consent given Procedure consent: procedure consent matches procedure scheduled Relevant documents: relevant documents present and verified Test results: test results available and properly labeled Imaging studies: imaging studies available Patient identity confirmed: arm band, provided demographic data and hospital-assigned identification number Time out: Immediately prior to procedure a "time out" was called to verify the correct patient, procedure, equipment, support staff and site/side marked as required.  Sedation: Patient sedated: yes  Patient tolerance: patient tolerated the procedure well with no immediate complications Comments: Despite multiple attempts and various techniques, unsuccessful reduction.      Final Clinical Impressions(s) / ED Diagnoses  ICD-10-CM   1. Hip  dislocation, left, initial encounter [facility])  S73.005A     ED Discharge Orders    None      Discharge Instructions   None     Barth Kirks. Sedonia Small, [place] [email or web address]    Maudie Flakes, MD 06/03/19 2028

## 2019-06-03 NOTE — ED Triage Notes (Signed)
PT brought in by [facility] today due to hip pain and recurrent left hip dislocation. PT had hip surgery per EMS about 5-6 weeks ago. PT is on [facility] home care.

## 2019-06-03 NOTE — ED Provider Notes (Signed)
[facility] EMERGENCY DEPARTMENT Provider Note   CSN: [id number] Arrival date & time: 06/03/19  1337     History   Chief Complaint Chief Complaint  Patient presents with  . Hip Injury    HPI MAISYN AARONSON is a 78 y.o. female.  Level 5 caveat secondary to dementia.  She is brought in by ambulance for evaluation of possible recurrent hip dislocation.  She had surgery by Dr. Ginette Pitman about 6 weeks ago and has already been here once with a hip dislocation.  She does not recall any trauma or fall.  She currently is in a knee immobilizer.     The history is provided by the EMS personnel.  Hip Pain This is a recurrent problem. The current episode started 3 to 5 hours ago. The problem occurs constantly. The problem has not changed since onset.Pertinent negatives include no chest pain, no abdominal pain and no headaches. The symptoms are aggravated by bending. The symptoms are relieved by position. She has tried nothing for the symptoms. The treatment provided no relief.    Past Medical History:  Diagnosis Date  . Anxiety   . Arm fracture, left   . Breast cancer ([place])   . Bulging disc   . Cardioembolic stroke ([place]) 123456  . Confusion   . Depression   . Difficulty swallowing solids   . GERD (gastroesophageal reflux disease)   . Hypertension   . Hypothyroidism   . Invasive ductal carcinoma of left breast ([place]) 02/24/2014  . Neuropathy   . Osteoporosis   . 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 Rotator cuff disorder, right   . Scoliosis   . Shortness of breath dyspnea    "I have reactive airway disease".    Patient Active Problem List   Diagnosis Date Noted  . Femoral neck fracture ([place]) 04/19/2019  . History of stroke 04/19/2019  . Cardioembolic stroke ([place]) 123456  . Acute cardioembolic stroke ([place]) 123456  . Acute colitis 08/01/2018  . Hypothyroidism 08/01/2018  . Hyponatremia 08/01/2018  . Hypokalemia 08/01/2018  . Infarction of right basal ganglia ([place])  08/01/2018  . Hypertension 08/01/2018  . Dementia ([place]) 08/01/2018  . Melena 05/07/2018  . Esophageal dysphagia 05/07/2018  . Osteoporosis with pathological fracture of forearm 10/03/2016  . Lumbar radiculopathy 03/13/2016  . Invasive ductal carcinoma of left breast ([place]) 02/24/2014  . 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 Multiple thyroid nodules 06/03/2013  . Rheumatoid arthritis ([place]) 07/15/2008    Past Surgical History:  Procedure Laterality Date  . ABDOMINAL HYSTERECTOMY    . Barbie Banner OSTEOTOMY Right 10/28/2014   Procedure: Barbie Banner OSTEOTOMY RIGHT FOOT;  Surgeon: Marcheta Grammes, DPM;  Location: [facility];  Service: Podiatry;  Laterality: Right;  . BIOPSY  05/15/2018   Procedure: BIOPSY;  Surgeon: Rogene Houston, MD;  Location: [facility];  Service: Endoscopy;;  gastric  . BONE BIOPSY  09/29/2015   Procedure: BONE BIOPSY AND BONE CULTURE SECOND TOE RIGHT FOOT;  Surgeon: Caprice Beaver, DPM;  Location: [facility];  Service: Podiatry;;  . BREAST SURGERY Left    lumpectomy and axillary lymph node with re-excision 2 weeks later   . BUNIONECTOMY Right 10/28/2014   Procedure: SILVER BUNIONECTOMY RIGHT FOOT;  Surgeon: Marcheta Grammes, DPM;  Location: [facility];  Service: Podiatry;  Laterality: Right;  . CATARACT EXTRACTION W/PHACO Left 08/10/2017   Procedure: CATARACT EXTRACTION PHACO AND INTRAOCULAR LENS PLACEMENT LEFT EYE;  Surgeon: Baruch Goldmann,  MD;  Location: [facility];  Service: Ophthalmology;  Laterality: Left;  CDE: 13.81  . CATARACT EXTRACTION W/PHACO Right 09/07/2017   Procedure: CATARACT EXTRACTION PHACO AND INTRAOCULAR LENS PLACEMENT RIGHT EYE;  Surgeon: Baruch Goldmann, MD;  Location: [facility];  Service: Ophthalmology;  Laterality: Right;  right  . COLONOSCOPY    . DILATION AND CURETTAGE OF UTERUS    . ESOPHAGEAL DILATION N/A 05/15/2018   Procedure: ESOPHAGEAL DILATION;  Surgeon: Rogene Houston, MD;  [facility];  Service:  Endoscopy;  Laterality: N/A;  . esophageal stricture    . ESOPHAGOGASTRODUODENOSCOPY N/A 05/15/2018   Procedure: ESOPHAGOGASTRODUODENOSCOPY (EGD);  Surgeon: Rogene Houston, MD;  [facility];  Service: Endoscopy;  Laterality: N/A;  1:45  . HAMMER TOE SURGERY Right 10/28/2014   Procedure: HAMMER TOE CORRECTION 2ND AND 3RD TOE RIGHT FOOT;  Surgeon: Marcheta Grammes, DPM;  Location: [facility];  Service: Podiatry;  Laterality: Right;  . HIP ARTHROPLASTY Left 04/19/2019   Procedure: ARTHROPLASTY BIPOLAR HIP (HEMIARTHROPLASTY);  Surgeon: Altamese Waikele, MD;  Location: [facility];  Service: Orthopedics;  Laterality: Left;  . LUMBAR LAMINECTOMY/DECOMPRESSION MICRODISCECTOMY Left 03/13/2016   Procedure: Left Lumbar Four-Five, Lumbar Five-Sacral One Laminotomy/Foraminotomy;  Surgeon: Kevan Ny Ditty, MD;  Location: [facility];  Service: Neurosurgery;  Laterality: Left;  . REMOVAL OF IMPLANT Right 09/29/2015   Procedure: REMOVAL OF IMPLANT 2ND TOE RIGHT FOOT;  Surgeon: Caprice Beaver, DPM;  Location: [facility];  Service: Podiatry;  Laterality: Right;  . TEE WITHOUT CARDIOVERSION N/A 08/05/2018   Procedure: TRANSESOPHAGEAL ECHOCARDIOGRAM (TEE);  Surgeon: Skeet Latch, MD;  Location: [facility] ENDOSCOPY;  Service: Cardiovascular;  Laterality: N/A;     OB History    Gravida  2   Para  2   Term  2   Preterm      AB      Living        SAB      TAB      Ectopic      Multiple      Live Births               Home Medications    Prior to Admission medications   Medication Sig Start Date End Date Taking? Authorizing Provider  acetaminophen (TYLENOL) 325 MG tablet Take 500 mg by mouth every 6 (six) hours as needed for mild pain or moderate pain.    [provider]  albuterol (PROAIR HFA) 108 (90 Base) MCG/ACT inhaler Inhale 2 puffs into the lungs every 6 (six) hours as needed for wheezing or shortness of breath.     [provider]  ALPRAZolam Duanne Moron) 0.25 MG  tablet Take 0.25-50 mg by mouth 3 (three) times daily as needed for anxiety.  01/26/19   [provider]  Calcium Carbonate-Vitamin D (CALTRATE 600+D) 600-400 MG-UNIT tablet Take 1 tablet by mouth 2 (two) times daily.    [provider]  cetirizine (ZYRTEC) 10 MG tablet Take 10 mg by mouth daily as needed for allergies.     [provider]  Cholecalciferol (VITAMIN D) 2000 UNITS CAPS Take 2,000 Units by mouth daily.     [provider]  clopidogrel (PLAVIX) 75 MG tablet Take 1 tablet (75 mg total) by mouth daily. 08/07/18   Shelly Coss, MD  Cranberry 125 MG TABS Take 1 tablet by mouth 3 (three) times daily as needed.    [provider]  denosumab (PROLIA) 60 MG/ML SOLN injection Inject 60 mg into  the skin every 6 (six) months. Administer in upper arm, thigh, or abdomen, got in June 2019    [provider]  donepezil (ARICEPT) 10 MG tablet Take 1 tablet (10 mg total) by mouth at bedtime. 10/08/18   Garvin Fila, MD  DULoxetine (CYMBALTA) 60 MG capsule Take 60 mg by mouth daily.    [provider]  folic acid (FOLVITE) Q000111Q MCG tablet Take 800 mcg by mouth daily.    [provider]  HYDROcodone-acetaminophen (NORCO/VICODIN) 5-325 MG tablet Take 1 tablet by mouth every 6 (six) hours as needed for moderate pain or severe pain. 04/23/19   Bonnielee Haff, MD  iron polysaccharides (NIFEREX) 150 MG capsule Take 1 capsule (150 mg total) by mouth daily. 04/24/19   Bonnielee Haff, MD  lisinopril (PRINIVIL,ZESTRIL) 10 MG tablet Take 5 mg by mouth daily as needed (for blood pressure). Son says she takes it as needed when she feels her BP is high, she will check it and then take it if needed.    [provider]  Magnesium 500 MG CAPS Take 500 mg by mouth daily.     [provider]  memantine (NAMENDA) 10 MG tablet Take 5 mg by mouth 2 (two) times daily.     [provider]  methotrexate (RHEUMATREX) 2.5 MG tablet  Take 15 mg by mouth once a week. Every Monday 08/31/15   [provider]  MYRBETRIQ 25 MG TB24 tablet Take 25 mg by mouth daily.  04/05/19   [provider]  omeprazole (PRILOSEC) 20 MG capsule Take 20 mg by mouth 2 (two) times daily before a meal.    [provider]  potassium chloride (K-DUR) 10 MEQ tablet Take 10 mEq by mouth at bedtime.    [provider]  vitamin B-12 1000 MCG tablet Take 1 tablet (1,000 mcg total) by mouth daily. 04/24/19   Bonnielee Haff, MD  Zinc 50 MG CAPS Take 50 mg by mouth daily.     [provider]    Family History Family History  Problem Relation Age of Onset  . Hypertension Mother   . Cancer Maternal Grandmother   . Cancer Cousin     Social History Social History   Tobacco Use  . Smoking status: Never Smoker  . Smokeless tobacco: Never Used  Substance Use Topics  . Alcohol use: No  . Drug use: No     Allergies   Celecoxib, Ibuprofen, Diphenhydramine, and Wygesic [propoxyphene n-acetaminophen]   Review of Systems Review of Systems  Unable to perform ROS: Dementia  Constitutional: Negative for fever.  HENT: Negative for sore throat.   Respiratory: Negative for cough.   Cardiovascular: Negative for chest pain.  Gastrointestinal: Negative for abdominal pain.  Neurological: Negative for headaches.     Physical Exam Updated Vital Signs BP 137/72 (BP Location: Right Arm)   Pulse 88   Temp 99 F (37.2 C) (Oral)   Resp 18   Ht 5\' 2"  (1.575 m)   Wt 60 kg   SpO2 97%   BMI 24.19 kg/m   Physical Exam Vitals signs and nursing note reviewed.  Constitutional:      General: She is not in acute distress.    Appearance: She is well-developed.  HENT:     Head: Normocephalic and atraumatic.  Eyes:     Conjunctiva/sclera: Conjunctivae normal.  Neck:     Musculoskeletal: Neck supple.  Cardiovascular:     Rate and Rhythm: Normal rate and regular rhythm.  Heart sounds: No murmur.  Pulmonary:      Effort: Pulmonary effort is normal. No respiratory distress.     Breath sounds: Normal breath sounds.  Abdominal:     Palpations: Abdomen is soft.     Tenderness: There is no abdominal tenderness.  Musculoskeletal:     Comments: Left lower extremity in a knee immobilizer.  She appears shortened somewhat and has pain with any type of attempt to straighten her out to length.  Full range of motion of upper extremities and right lower extremity without any pain or limitation.  Skin:    General: Skin is warm and dry.     Capillary Refill: Capillary refill takes less than 2 seconds.  Neurological:     Mental Status: She is alert.     Comments: Patient is awake and alert.  She is not oriented to time or place.  She is moving all extremities without any focal deficits.      ED Treatments / Results  Labs (all labs ordered are listed, but only abnormal results are displayed) Labs Reviewed - No data to display  EKG None  Radiology Dg Hip Unilat With Pelvis 2-3 Views Left  Result Date: 06/03/2019 CLINICAL DATA:  Acute LEFT hip dislocation. EXAM: DG HIP (WITH OR WITHOUT PELVIS) 2-3V LEFT COMPARISON:  05/28/2019 FINDINGS: LEFT femoral prosthesis noted. SUPERIOR dislocation of the prosthetic LEFT femoral head noted. A few tiny bony fragments in the acetabular cavity are age indeterminate. IMPRESSION: SUPERIOR dislocation of the LEFT femoral head prosthesis. Electronically Signed   By: Margarette Canada M.D.   On: 06/03/2019 14:49    Procedures Procedures (including critical care time)  Medications Ordered in ED Medications - No data to display   Initial Impression / Assessment and Plan / ED Course  I have reviewed the triage vital signs and the nursing notes.  Pertinent labs & imaging results that were available during my care of the patient were reviewed by me and considered in my medical decision making (see chart for details).  Clinical Course as of Jun 03 1635  Tue Jun 02, 4364  617  78 year old female with history of prosthetic hip on the left and is already dislocated once here again with hip pain and possible dislocation.  Differential includes dislocation, fracture.  X-ray shows superior dislocation.  Will place an order for an IV and she will likely need conscious sedation for reduction.   [MB]  53 Patient's daughter is here now and have updated her on the plan.  Daughter seems very frustrated how the patient got a hip dislocated again.  It sounds like the patient is very restless and pulls up the straps and tries to get out of things so she is not even sure that an abduction pillow would help.   [MB]  [id number] Patient signed out to Dr. Freddi Che with plan for getting into a room and having procedural sedation for reduction of a hip dislocation.   [MB]    Clinical Course User Index [MB] Hayden Rasmussen, MD        Final Clinical Impressions(s) / ED Diagnoses   Final diagnoses:  None    ED Discharge Orders    None       Hayden Rasmussen, MD 06/03/19 [phone]

## 2019-06-03 NOTE — Progress Notes (Signed)
Called from Dr. Sedonia Small from [facility].  Patient with a recurrent left prosthetic hip dislocation, history of recent hemiarthroplasty done by Dr. Marcelino Scot.  Attempts by the emergency room so far have been unsuccessful at achieving a reduction.  Patient will need operating room closed reduction.  There is no currently available operating room space at [facility], whereas there is available space as well as surgeons with subspecialty total joint expertise at [facility].  Plan for transfer to the medical service down at [facility] for admission, and optimization, and surgical intervention with possible revision hip arthroplasty versus closed reduction tomorrow at [facility].  I will confer with the surgeons available at [facility] regarding the appropriate revision management.  Johnny Bridge, MD

## 2019-06-03 NOTE — H&P (Addendum)
History and Physical    [name] [id number] DOB: April 28, 1941 DOA: 06/03/2019  PCP: Celene Squibb, MD   Patient coming from: Home   Chief Complaint: Left hip pain   HPI: [name] is a 78 y.o. female with medical history significant for hypertension, anxiety, hypothyroidism, history of CVA, dementia, left hip fracture status post operative repair on April 19, 2019, left prosthetic hip dislocation reduced in the ED on 05/28/2019, now returning to the emergency department with recurrent left hip pain.  Patient has difficulty contributing to the history due to her clinical condition with dementia, and her daughter (medical power of attorney) assist with history.  Patient has been in a knee immobilizer at home, has been essentially bedbound, is frequently restless and pulling at her knee immobilizer in bed and then began to complain of pain again today, localizing this to the left hip.  She had not fallen.  There were no other complaints.  Daughter was concerned for recurrent dislocation and patient was brought into the ED with EMS.  ED Course: Upon arrival to the ED, patient is found to be afebrile, saturating well on room air, hypertensive to 200/100.  Radiographs of the left hip demonstrate superior dislocation of the left femoral head prosthesis.  Chemistry panel notable for potassium 3.0 and albumin 2.7.  CBC with stable hemoglobin at 9.9 but new mild macrocytosis.  Chest x-ray with no acute findings.  COVID-19 testing is in process. Reduction was attempted with conscious sedation in the emergency department but unsuccessful despite multiple attempts.  Orthopedic surgery was consulted and recommended medical admission to [facility]  Review of Systems:  All other systems reviewed and apart from HPI, are negative.  Past Medical History:  Diagnosis Date  . Anxiety   . Arm fracture, left   . Breast cancer ([place])   . Bulging disc   . Cardioembolic stroke ([place]) 123456   . Confusion   . Depression   . Difficulty swallowing solids   . GERD (gastroesophageal reflux disease)   . Hypertension   . Hypothyroidism   . Invasive ductal carcinoma of left breast ([place]) 02/24/2014  . Neuropathy   . Osteoporosis   . 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 Rotator cuff disorder, right   . Scoliosis   . Shortness of breath dyspnea    "I have reactive airway disease".    Past Surgical History:  Procedure Laterality Date  . ABDOMINAL HYSTERECTOMY    . Barbie Banner OSTEOTOMY Right 10/28/2014   Procedure: Barbie Banner OSTEOTOMY RIGHT FOOT;  Surgeon: Marcheta Grammes, DPM;  Location: [facility];  Service: Podiatry;  Laterality: Right;  . BIOPSY  05/15/2018   Procedure: BIOPSY;  Surgeon: Rogene Houston, MD;  [facility];  Service: Endoscopy;;  gastric  . BONE BIOPSY  09/29/2015   Procedure: BONE BIOPSY AND BONE CULTURE SECOND TOE RIGHT FOOT;  Surgeon: Caprice Beaver, DPM;  Location: [facility];  Service: Podiatry;;  . BREAST SURGERY Left    lumpectomy and axillary lymph node with re-excision 2 weeks later   . BUNIONECTOMY Right 10/28/2014   Procedure: SILVER BUNIONECTOMY RIGHT FOOT;  Surgeon: Marcheta Grammes, DPM;  Location: [facility];  Service: Podiatry;  Laterality: Right;  . CATARACT EXTRACTION W/PHACO Left 08/10/2017   Procedure: CATARACT EXTRACTION PHACO AND INTRAOCULAR LENS PLACEMENT LEFT EYE;  Surgeon: Baruch Goldmann, MD;  Location: [facility];  Service: Ophthalmology;  Laterality: Left;  CDE: 13.81  . CATARACT EXTRACTION W/PHACO Right  09/07/2017   Procedure: CATARACT EXTRACTION PHACO AND INTRAOCULAR LENS PLACEMENT RIGHT EYE;  Surgeon: Baruch Goldmann, MD;  Location: [facility];  Service: Ophthalmology;  Laterality: Right;  right  . COLONOSCOPY    . DILATION AND CURETTAGE OF UTERUS    . ESOPHAGEAL DILATION N/A 05/15/2018   Procedure: ESOPHAGEAL DILATION;  Surgeon: Rogene Houston, MD;  [facility];  Service: Endoscopy;  Laterality: N/A;  . esophageal stricture     . ESOPHAGOGASTRODUODENOSCOPY N/A 05/15/2018   Procedure: ESOPHAGOGASTRODUODENOSCOPY (EGD);  Surgeon: Rogene Houston, MD;  [facility];  Service: Endoscopy;  Laterality: N/A;  1:45  . HAMMER TOE SURGERY Right 10/28/2014   Procedure: HAMMER TOE CORRECTION 2ND AND 3RD TOE RIGHT FOOT;  Surgeon: Marcheta Grammes, DPM;  Location: [facility];  Service: Podiatry;  Laterality: Right;  . HIP ARTHROPLASTY Left 04/19/2019   Procedure: ARTHROPLASTY BIPOLAR HIP (HEMIARTHROPLASTY);  Surgeon: Altamese Herman, MD;  Location: [place];  Service: Orthopedics;  Laterality: Left;  . LUMBAR LAMINECTOMY/DECOMPRESSION MICRODISCECTOMY Left 03/13/2016   Procedure: Left Lumbar Four-Five, Lumbar Five-Sacral One Laminotomy/Foraminotomy;  Surgeon: Kevan Ny Ditty, MD;  [facility];  Service: Neurosurgery;  Laterality: Left;  . REMOVAL OF IMPLANT Right 09/29/2015   Procedure: REMOVAL OF IMPLANT 2ND TOE RIGHT FOOT;  Surgeon: Caprice Beaver, DPM;  Location: [facility];  Service: Podiatry;  Laterality: Right;  . TEE WITHOUT CARDIOVERSION N/A 08/05/2018   Procedure: TRANSESOPHAGEAL ECHOCARDIOGRAM (TEE);  Surgeon: Skeet Latch, MD;  Location: [place];  Service: Cardiovascular;  Laterality: N/A;     reports that she has never smoked. She has never used smokeless tobacco. She reports that she does not drink alcohol or use drugs.  Allergies  Allergen Reactions  . Celecoxib Swelling and Other (See Comments)  . Ibuprofen Swelling and Other (See Comments)  . Diphenhydramine Rash and Other (See Comments)    Wide awake.   Micheline Maze [Propoxyphene N-Acetaminophen] Nausea And Vomiting    Family History  Problem Relation Age of Onset  . Hypertension Mother   . Cancer Maternal Grandmother   . Cancer Cousin      Prior to Admission medications   Medication Sig Start Date End Date Taking? Authorizing Provider  acetaminophen (TYLENOL) 325 MG tablet Take 500 mg by mouth every 6 (six) hours as needed for  mild pain or moderate pain.   Yes [provider]  albuterol (PROAIR HFA) 108 (90 Base) MCG/ACT inhaler Inhale 2 puffs into the lungs every 6 (six) hours as needed for wheezing or shortness of breath.    Yes [provider]  ALPRAZolam (XANAX) 0.25 MG tablet Take 0.25-50 mg by mouth 3 (three) times daily as needed for anxiety.  01/26/19  Yes [provider]  Calcium Carbonate-Vitamin D (CALTRATE 600+D) 600-400 MG-UNIT tablet Take 1 tablet by mouth 2 (two) times daily.   Yes [provider]  cetirizine (ZYRTEC) 10 MG tablet Take 10 mg by mouth daily as needed for allergies.    Yes [provider]  Cholecalciferol (VITAMIN D) 2000 UNITS CAPS Take 2,000 Units by mouth daily.    Yes [provider]  clopidogrel (PLAVIX) 75 MG tablet Take 1 tablet (75 mg total) by mouth daily. 08/07/18  Yes Shelly Coss, MD  Cranberry 125 MG TABS Take 1 tablet by mouth 3 (three) times daily as needed.   Yes [provider]  DULoxetine (CYMBALTA) 60 MG capsule Take 60 mg by mouth daily.   Yes [provider]  folic acid (FOLVITE) Q000111Q MCG tablet Take 800 mcg by mouth daily.   Yes [provider]  HYDROcodone-acetaminophen (NORCO/VICODIN) 5-325 MG tablet Take 1 tablet by mouth every 6 (six) hours as needed for moderate pain or severe pain. 04/23/19  Yes Bonnielee Haff, MD  iron polysaccharides (NIFEREX) 150 MG capsule Take 1 capsule (150 mg total) by mouth daily. 04/24/19  Yes Bonnielee Haff, MD  lisinopril (PRINIVIL,ZESTRIL) 10 MG tablet Take 5 mg by mouth daily as needed (for blood pressure). Son says she takes it as needed when she feels her BP is high, she will check it and then take it if needed.   Yes [provider]  Magnesium 500 MG CAPS Take 500 mg by mouth daily.    Yes [provider]  memantine (NAMENDA) 5 MG tablet Take 5 mg by mouth 2 (two) times daily.    Yes [provider]  methotrexate (RHEUMATREX)  2.5 MG tablet Take 15 mg by mouth every Wednesday.  08/31/15  Yes [provider]  MYRBETRIQ 25 MG TB24 tablet Take 25 mg by mouth daily.  04/05/19  Yes [provider]  omeprazole (PRILOSEC) 20 MG capsule Take 20 mg by mouth 2 (two) times daily before a meal.   Yes [provider]  potassium chloride (K-DUR) 10 MEQ tablet Take 10 mEq by mouth at bedtime.   Yes [provider]  vitamin B-12 1000 MCG tablet Take 1 tablet (1,000 mcg total) by mouth daily. 04/24/19  Yes Bonnielee Haff, MD  Zinc 50 MG CAPS Take 50 mg by mouth daily.    Yes [provider]  denosumab (PROLIA) 60 MG/ML SOLN injection Inject 60 mg into the skin every 6 (six) months. Administer in upper arm, thigh, or abdomen, got in June 2019    [provider]  donepezil (ARICEPT) 10 MG tablet Take 1 tablet (10 mg total) by mouth at bedtime. Patient not taking: Reported on 06/03/2019 10/08/18   Garvin Fila, MD    Physical Exam: Vitals:   06/03/19 1938 06/03/19 1950 06/03/19 1951 06/03/19 2000  BP: (!) 123/96 (!) 202/98 (!) 201/87 (!) 123/110  Pulse: 85 88 86 86  Resp: 16 (!) 28 (!) 28 17  Temp:      TempSrc:      SpO2:  100% 97% 99%  Weight:      Height:        Constitutional: NAD, calm  Eyes: PERTLA, lids and conjunctivae normal ENMT: Mucous membranes are moist. Posterior pharynx clear of any exudate or lesions.   Neck: normal, supple, no masses, no thyromegaly Respiratory: no wheezing, no crackles. Normal respiratory effort. No accessory muscle use.  Cardiovascular: S1 & S2 heard, regular rate and rhythm. No extremity edema.   Abdomen: No distension, no tenderness, soft. Bowel sounds active.  Musculoskeletal: no clubbing / cyanosis. Left hip pain and deformity, neurovascularly intact distally.    Skin: no significant rashes, lesions, ulcers. Warm, dry, well-perfused. Neurologic: No gross facial asymmetry. Sensation intact. Moving all extremities.  Psychiatric: Alert.  Oriented to person only. Calm, cooperative.    Labs on Admission: I have personally reviewed following labs and imaging studies  CBC: Recent Labs  Lab 06/03/19 2049  WBC 7.3  HGB 9.9*  HCT 32.0*  MCV 101.9*  PLT 123456   Basic Metabolic Panel: No results for input(s): NA, K, CL, CO2, GLUCOSE, BUN, CREATININE, CALCIUM, MG, PHOS in the last 168 hours. GFR: CrCl cannot be calculated (Patient's most recent lab  result is older than the maximum 21 days allowed.). Liver Function Tests: No results for input(s): AST, ALT, ALKPHOS, BILITOT, PROT, ALBUMIN in the last 168 hours. No results for input(s): LIPASE, AMYLASE in the last 168 hours. No results for input(s): AMMONIA in the last 168 hours. Coagulation Profile: No results for input(s): INR, PROTIME in the last 168 hours. Cardiac Enzymes: No results for input(s): CKTOTAL, CKMB, CKMBINDEX, TROPONINI in the last 168 hours. BNP (last 3 results) No results for input(s): PROBNP in the last 8760 hours. HbA1C: No results for input(s): HGBA1C in the last 72 hours. CBG: No results for input(s): GLUCAP in the last 168 hours. Lipid Profile: No results for input(s): CHOL, HDL, LDLCALC, TRIG, CHOLHDL, LDLDIRECT in the last 72 hours. Thyroid Function Tests: No results for input(s): TSH, T4TOTAL, FREET4, T3FREE, THYROIDAB in the last 72 hours. Anemia Panel: No results for input(s): VITAMINB12, FOLATE, FERRITIN, TIBC, IRON, RETICCTPCT in the last 72 hours. Urine analysis:    Component Value Date/Time   COLORURINE YELLOW 04/19/2019 0126   APPEARANCEUR CLOUDY (A) 04/19/2019 0126   LABSPEC 1.012 04/19/2019 0126   PHURINE 8.0 04/19/2019 0126   GLUCOSEU NEGATIVE 04/19/2019 0126   HGBUR NEGATIVE 04/19/2019 0126   BILIRUBINUR NEGATIVE 04/19/2019 0126   KETONESUR NEGATIVE 04/19/2019 0126   PROTEINUR NEGATIVE 04/19/2019 0126   NITRITE NEGATIVE 04/19/2019 0126   LEUKOCYTESUR NEGATIVE 04/19/2019 0126   Sepsis Labs:  @[email or web address] (procalcitonin:4,lacticidven:4) ) Recent Results (from the past 240 hour(s))  SARS Coronavirus 2 [facility] order, Performed in [facility] hospital lab) Nasopharyngeal Nasopharyngeal Swab     Status: None   Collection Time: 06/03/19  7:55 PM   Specimen: Nasopharyngeal Swab  Result Value Ref Range Status   SARS Coronavirus 2 NEGATIVE NEGATIVE Final    Comment: (NOTE) If result is NEGATIVE SARS-CoV-2 target nucleic acids are NOT DETECTED. The SARS-CoV-2 RNA is generally detectable in upper and lower  respiratory specimens during the acute phase of infection. The lowest  concentration of SARS-CoV-2 viral copies this assay can detect is 250  copies / mL. A negative result does not preclude SARS-CoV-2 infection  and should not be used as the sole basis for treatment or other  patient management decisions.  A negative result may occur with  improper specimen collection / handling, submission of specimen other  than nasopharyngeal swab, presence of viral mutation(s) within the  areas targeted by this assay, and inadequate number of viral copies  (<250 copies / mL). A negative result must be combined with clinical  observations, patient history, and epidemiological information. If result is POSITIVE SARS-CoV-2 target nucleic acids are DETECTED. The SARS-CoV-2 RNA is generally detectable in upper and lower  respiratory specimens dur ing the acute phase of infection.  Positive  results are indicative of active infection with SARS-CoV-2.  Clinical  correlation with patient history and other diagnostic information is  necessary to determine patient infection status.  Positive results do  not rule out bacterial infection or co-infection with other viruses. If result is PRESUMPTIVE POSTIVE SARS-CoV-2 nucleic acids MAY BE PRESENT.   A presumptive positive result was obtained on the submitted specimen  and confirmed on repeat testing.  While 2019 novel coronavirus  (SARS-CoV-2) nucleic  acids may be present in the submitted sample  additional confirmatory testing may be necessary for epidemiological  and / or clinical management purposes  to differentiate between  SARS-CoV-2 and other Sarbecovirus currently known to infect humans.  If clinically indicated additional testing with an alternate test  methodology [phone]) is advised. The SARS-CoV-2 RNA is generally  detectable in upper and lower respiratory sp ecimens during the acute  phase of infection. The expected result is Negative. Fact Sheet for Patients:  StrictlyIdeas.no Fact Sheet for Healthcare Providers: BankingDealers.co.za This test is not yet approved or cleared by the [place] FDA and has been authorized for detection and/or diagnosis of SARS-CoV-2 by FDA under an Emergency Use Authorization (EUA).  This EUA will remain in effect (meaning this test can be used) for the duration of the COVID-19 declaration under Section 564(b)(1) of the Act, 21 U.S.C. section 360bbb-3(b)(1), unless the authorization is terminated or revoked sooner. Performed at [facility], [address] [place], [place] [address]      Radiological Exams on Admission: Dg Chest Port 1 View  Result Date: 06/03/2019 CLINICAL DATA:  Preoperative chest radiograph prior to hip surgery. EXAM: PORTABLE CHEST 1 VIEW COMPARISON:  08/29/2018 FINDINGS: UPPER limits normal heart size noted. There is no evidence of focal airspace disease, pulmonary edema, suspicious pulmonary nodule/mass, pleural effusion, or pneumothorax. No acute bony abnormalities are identified. IMPRESSION: UPPER limits normal heart size without evidence of acute cardiopulmonary disease. Electronically Signed   By: Margarette Canada M.D.   On: 06/03/2019 21:05   Dg Hip Unilat With Pelvis 2-3 Views Left  Result Date: 06/03/2019 CLINICAL DATA:  Acute LEFT hip dislocation. EXAM: DG HIP (WITH OR WITHOUT PELVIS) 2-3V LEFT COMPARISON:   05/28/2019 FINDINGS: LEFT femoral prosthesis noted. SUPERIOR dislocation of the prosthetic LEFT femoral head noted. A few tiny bony fragments in the acetabular cavity are age indeterminate. IMPRESSION: SUPERIOR dislocation of the LEFT femoral head prosthesis. Electronically Signed   By: Margarette Canada M.D.   On: 06/03/2019 14:49    EKG: Independently reviewed. Sinus rhythm, borderline repolarization abnormality is similar to priors.   Assessment/Plan    ADDENDUM: Patient developed a fever to 38.3 C in ED. Vitals otherwise normal. CXR clear. Culture blood, check UA.   1. Dislocation of left prosthetic hip  - Patient had left hip fracture in August and underwent hemiarthroplasty on 8/15, had dislocation reduced in ED on 9/23, and now presents with recurrent dislocation  - Attempts to reduce in ED were unsuccessful and orthopedic surgery recommended admission to [place] on the available data, Ms. Stangelo presents an estimated 2% risk of perioperative MI or cardiac arrest due to her chronic comorbid conditions and dependency, no further testing is recommended  - Continue pain-control, NPO after midnight, MIVF, hold Plavix and ACE-inhibitor for possible surgery, replace potassium, control BP   2. Hypertension with hypertensive urgency  - BP 200/100 in ED with pain likely contributing  - Continue pain-control, hold ACE for orthopedic surgery, use labetalol IVP's as needed    3. History of CVA  - Hold Plavix prior to surgery  - Dysphagia 3 diet with thin liquids recommended during recent admission    4. Rheumatoid arthritis  - Can continue methotrexate perioperatively    5. Dementia  - Continue Namenda, supportive care   6. Macrocytic anemia  - Hgb appears stable at 9.9 on admission  - She will continue nutritional supplements    7. Anxiety  - Calm on admission  - Continue Cymbalta and low-dose prn Xanax    8. Hypokalemia  - Serum potassium is 3.0 on admission  -  Replaced with oral and IV potassium, empiric mag given, repeat chem panel in am    PPE: Mask, face shield  DVT  prophylaxis: SCD's  Code Status: Full per daughter  Family Communication: Daughter (MPOA) was updated in ED  Consults called: Orthopedic surgery  Admission status: Inpatient    Vianne Bulls, MD [facility] Pager [phone]  If 7PM-7AM, please contact night-coverage [email or web address] Password [facility]  06/03/2019, 9:26 PM

## 2019-06-04 ENCOUNTER — Encounter (HOSPITAL_COMMUNITY)
Admission: EM | Disposition: A | Discharge status: Skilled Nursing Facility | Payer: Self-pay | Source: Home / Self Care | Attending: Internal Medicine

## 2019-06-04 ENCOUNTER — Encounter (HOSPITAL_COMMUNITY): Payer: Self-pay | Admitting: Certified Registered Nurse Anesthetist

## 2019-06-04 LAB — CBC
HCT: 33.7 % — ABNORMAL LOW (ref 36.0–46.0)
Hemoglobin: 10.6 g/dL — ABNORMAL LOW (ref 12.0–15.0)
MCH: 32 pg (ref 26.0–34.0)
MCHC: 31.5 g/dL (ref 30.0–36.0)
MCV: 101.8 fL — ABNORMAL HIGH (ref 80.0–100.0)
Platelets: 190 10*3/uL (ref 150–400)
RBC: 3.31 MIL/uL — ABNORMAL LOW (ref 3.87–5.11)
RDW: 16.8 % — ABNORMAL HIGH (ref 11.5–15.5)
WBC: 10.6 10*3/uL — ABNORMAL HIGH (ref 4.0–10.5)
nRBC: 0 % (ref 0.0–0.2)

## 2019-06-04 LAB — BASIC METABOLIC PANEL
Anion gap: 8 (ref 5–15)
BUN: 13 mg/dL (ref 8–23)
CO2: 26 mmol/L (ref 22–32)
Calcium: 9.4 mg/dL (ref 8.9–10.3)
Chloride: 105 mmol/L (ref 98–111)
Creatinine, Ser: 0.64 mg/dL (ref 0.44–1.00)
GFR calc Af Amer: >60 mL/min (ref >60)
GFR calc non Af Amer: >60 mL/min (ref >60)
Glucose, Bld: 122 mg/dL — ABNORMAL HIGH (ref 70–99)
Potassium: 3.6 mmol/L (ref 3.5–5.1)
Sodium: 139 mmol/L (ref 135–145)

## 2019-06-04 LAB — SURGICAL PCR SCREEN
MRSA, PCR: POSITIVE — AB
Staphylococcus aureus: POSITIVE — AB

## 2019-06-04 LAB — GLUCOSE, CAPILLARY: Glucose-Capillary: 124 mg/dL — ABNORMAL HIGH (ref 70–99)

## 2019-06-04 LAB — ABO/RH: ABO/RH(D): O POS

## 2019-06-04 SURGERY — CLOSED REDUCTION, HIP
Anesthesia: General | Site: Hip | Laterality: Left

## 2019-06-04 MED ORDER — MUPIROCIN 2 % EX OINT
1.0000 "application " | TOPICAL_OINTMENT | Freq: Two times a day (BID) | CUTANEOUS | Status: AC
  Administered 2019-06-04 – 2019-06-08 (×8): 1 via NASAL
  Filled 2019-06-04: qty 22

## 2019-06-04 MED ORDER — MIRABEGRON ER 25 MG PO TB24
25.0000 mg | ORAL_TABLET | Freq: Every day | ORAL | Status: DC
  Administered 2019-06-06 – 2019-06-12 (×6): 25 mg via ORAL
  Filled 2019-06-04 (×9): qty 1

## 2019-06-04 MED ORDER — VITAMIN B-12 1000 MCG PO TABS
1000.0000 ug | ORAL_TABLET | Freq: Every day | ORAL | Status: DC
  Administered 2019-06-06 – 2019-06-12 (×5): 1000 ug via ORAL
  Filled 2019-06-04 (×8): qty 1

## 2019-06-04 MED ORDER — POLYETHYLENE GLYCOL 3350 17 G PO PACK: 17.0000 g | PACK | Freq: Every day | ORAL | Status: DC | PRN

## 2019-06-04 MED ORDER — POTASSIUM CHLORIDE IN NACL 40-0.9 MEQ/L-% IV SOLN
INTRAVENOUS | Status: AC
  Administered 2019-06-04: 16:00:00 75 mL/h via INTRAVENOUS
  Filled 2019-06-04: qty 1000

## 2019-06-04 MED ORDER — PANTOPRAZOLE SODIUM 40 MG PO TBEC
40.0000 mg | DELAYED_RELEASE_TABLET | Freq: Every day | ORAL | Status: DC
  Administered 2019-06-04 – 2019-06-12 (×6): 40 mg via ORAL
  Filled 2019-06-04 (×8): qty 1

## 2019-06-04 MED ORDER — DULOXETINE HCL 60 MG PO CPEP
60.0000 mg | ORAL_CAPSULE | Freq: Every day | ORAL | Status: DC
  Administered 2019-06-06 – 2019-06-12 (×6): 60 mg via ORAL
  Filled 2019-06-04 (×8): qty 1

## 2019-06-04 MED ORDER — FOLIC ACID 1 MG PO TABS
500.0000 ug | ORAL_TABLET | Freq: Every day | ORAL | Status: DC
  Administered 2019-06-04 – 2019-06-12 (×6): 0.5 mg via ORAL
  Filled 2019-06-04 (×8): qty 1

## 2019-06-04 MED ORDER — MEMANTINE HCL 10 MG PO TABS
5.0000 mg | ORAL_TABLET | Freq: Two times a day (BID) | ORAL | Status: DC
  Administered 2019-06-04 – 2019-06-12 (×15): 5 mg via ORAL
  Filled 2019-06-04 (×16): qty 1

## 2019-06-04 MED ORDER — METHOTREXATE 2.5 MG PO TABS
15.0000 mg | ORAL_TABLET | ORAL | Status: DC
  Filled 2019-06-04: qty 6

## 2019-06-04 NOTE — H&P (View-Only) (Signed)
ORTHOPAEDIC CONSULTATION  REQUESTING PHYSICIAN: Georgette Shell, MD  PCP:  Celene Squibb, MD  Chief Complaint: Dislocated left hip hemiarthroplasty  HPI: [name] is a 78 y.o. female with a history of hypertension, anxiety, hypothyroidism, previous CVA, dementia, left hip fracture status post hemiarthroplasty on April 19, 2019, left prosthetic hip dislocation reduced in the ED on 05/28/2019, who complains of left hip pain and inability to weight-bear.  She has been in a knee immobilizer since her previous dislocation.  She has severe dementia.  She cannot contribute to the history.  I spoke to her daughter on the phone who provided the history.  Past Medical History:  Diagnosis Date  . Anxiety   . Arm fracture, left   . Breast cancer ([place])   . Bulging disc   . Cardioembolic stroke ([place]) 123456  . Confusion   . Depression   . Difficulty swallowing solids   . GERD (gastroesophageal reflux disease)   . Hypertension   . Hypothyroidism   . Invasive ductal carcinoma of left breast ([place]) 02/24/2014  . Neuropathy   . Osteoporosis   . 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 Rotator cuff disorder, right   . Scoliosis   . Shortness of breath dyspnea    "I have reactive airway disease".   Past Surgical History:  Procedure Laterality Date  . ABDOMINAL HYSTERECTOMY    . Barbie Banner OSTEOTOMY Right 10/28/2014   Procedure: Barbie Banner OSTEOTOMY RIGHT FOOT;  Surgeon: Marcheta Grammes, DPM;  Location: [facility];  Service: Podiatry;  Laterality: Right;  . BIOPSY  05/15/2018   Procedure: BIOPSY;  Surgeon: Rogene Houston, MD;  Location: [facility];  Service: Endoscopy;;  gastric  . BONE BIOPSY  09/29/2015   Procedure: BONE BIOPSY AND BONE CULTURE SECOND TOE RIGHT FOOT;  Surgeon: Caprice Beaver, DPM;  Location: [facility];  Service: Podiatry;;  . BREAST SURGERY Left    lumpectomy and axillary lymph node with re-excision 2 weeks later   . BUNIONECTOMY Right 10/28/2014   Procedure:  SILVER BUNIONECTOMY RIGHT FOOT;  Surgeon: Marcheta Grammes, DPM;  Location: [facility];  Service: Podiatry;  Laterality: Right;  . CATARACT EXTRACTION W/PHACO Left 08/10/2017   Procedure: CATARACT EXTRACTION PHACO AND INTRAOCULAR LENS PLACEMENT LEFT EYE;  Surgeon: Baruch Goldmann, MD;  Location: [facility];  Service: Ophthalmology;  Laterality: Left;  CDE: 13.81  . CATARACT EXTRACTION W/PHACO Right 09/07/2017   Procedure: CATARACT EXTRACTION PHACO AND INTRAOCULAR LENS PLACEMENT RIGHT EYE;  Surgeon: Baruch Goldmann, MD;  Location: [facility];  Service: Ophthalmology;  Laterality: Right;  right  . COLONOSCOPY    . DILATION AND CURETTAGE OF UTERUS    . ESOPHAGEAL DILATION N/A 05/15/2018   Procedure: ESOPHAGEAL DILATION;  Surgeon: Rogene Houston, MD;  [facility];  Service: Endoscopy;  Laterality: N/A;  . esophageal stricture    . ESOPHAGOGASTRODUODENOSCOPY N/A 05/15/2018   Procedure: ESOPHAGOGASTRODUODENOSCOPY (EGD);  Surgeon: Rogene Houston, MD;  [facility];  Service: Endoscopy;  Laterality: N/A;  1:45  . HAMMER TOE SURGERY Right 10/28/2014   Procedure: HAMMER TOE CORRECTION 2ND AND 3RD TOE RIGHT FOOT;  Surgeon: Marcheta Grammes, DPM;  Location: [facility];  Service: Podiatry;  Laterality: Right;  . HIP ARTHROPLASTY Left 04/19/2019   Procedure: ARTHROPLASTY BIPOLAR HIP (HEMIARTHROPLASTY);  Surgeon: Altamese Woonsocket, MD;  Location: [facility];  Service: Orthopedics;  Laterality: Left;  . LUMBAR LAMINECTOMY/DECOMPRESSION MICRODISCECTOMY Left 03/13/2016   Procedure: Left Lumbar Four-Five,  Lumbar Five-Sacral One Laminotomy/Foraminotomy;  Surgeon: Kevan Ny Ditty, MD;  Location: [facility];  Service: Neurosurgery;  Laterality: Left;  . REMOVAL OF IMPLANT Right 09/29/2015   Procedure: REMOVAL OF IMPLANT 2ND TOE RIGHT FOOT;  Surgeon: Caprice Beaver, DPM;  Location: [facility];  Service: Podiatry;  Laterality: Right;  . TEE WITHOUT CARDIOVERSION N/A 08/05/2018   Procedure: TRANSESOPHAGEAL  ECHOCARDIOGRAM (TEE);  Surgeon: Skeet Latch, MD;  Location: [facility];  Service: Cardiovascular;  Laterality: N/A;   Social History   Socioeconomic History  . Marital status: Widowed    Spouse name: Not on file  . Number of children: Not on file  . Years of education: Not on file  . Highest education level: Not on file  Occupational History  . Not on file  Social Needs  . Financial resource strain: Not on file  . Food insecurity    Worry: Not on file    Inability: Not on file  . Transportation needs    Medical: Not on file    Non-medical: Not on file  Tobacco Use  . Smoking status: Never Smoker  . Smokeless tobacco: Never Used  Substance and Sexual Activity  . Alcohol use: No  . Drug use: No  . Sexual activity: Never    Birth control/protection: None  Lifestyle  . Physical activity    Days per week: Not on file    Minutes per session: Not on file  . Stress: Not on file  Relationships  . Social Herbalist on phone: Not on file    Gets together: Not on file    Attends religious service: Not on file    Active member of club or organization: Not on file    Attends meetings of clubs or organizations: Not on file    Relationship status: Not on file  Other Topics Concern  . Not on file  Social History Narrative  . Not on file   Family History  Problem Relation Age of Onset  . Hypertension Mother   . Cancer Maternal Grandmother   . Cancer Cousin    Allergies  Allergen Reactions  . Celecoxib Swelling and Other (See Comments)  . Ibuprofen Swelling and Other (See Comments)  . Diphenhydramine Rash and Other (See Comments)    Wide awake.   Micheline Maze [Propoxyphene N-Acetaminophen] Nausea And Vomiting   Prior to Admission medications   Medication Sig Start Date End Date Taking? Authorizing Provider  acetaminophen (TYLENOL) 325 MG tablet Take 500 mg by mouth every 6 (six) hours as needed for mild pain or moderate pain.   Yes [provider]   albuterol (PROAIR HFA) 108 (90 Base) MCG/ACT inhaler Inhale 2 puffs into the lungs every 6 (six) hours as needed for wheezing or shortness of breath.    Yes [provider]  ALPRAZolam (XANAX) 0.25 MG tablet Take 0.25-50 mg by mouth 3 (three) times daily as needed for anxiety.  01/26/19  Yes [provider]  Calcium Carbonate-Vitamin D (CALTRATE 600+D) 600-400 MG-UNIT tablet Take 1 tablet by mouth 2 (two) times daily.   Yes [provider]  cetirizine (ZYRTEC) 10 MG tablet Take 10 mg by mouth daily as needed for allergies.    Yes [provider]  Cholecalciferol (VITAMIN D) 2000 UNITS CAPS Take 2,000 Units by mouth daily.    Yes [provider]  clopidogrel (PLAVIX) 75 MG tablet Take 1 tablet (75 mg total) by mouth daily. 08/07/18  Yes Shelly Coss, MD  Cranberry 125 MG TABS Take 1 tablet by mouth 3 (three) times daily as needed.   Yes [provider]  DULoxetine (CYMBALTA) 60 MG capsule Take 60 mg by mouth daily.   Yes [provider]  folic acid (FOLVITE) Q000111Q MCG tablet Take 800 mcg by mouth daily.   Yes [provider]  HYDROcodone-acetaminophen (NORCO/VICODIN) 5-325 MG tablet Take 1 tablet by mouth every 6 (six) hours as needed for moderate pain or severe pain. 04/23/19  Yes Bonnielee Haff, MD  iron polysaccharides (NIFEREX) 150 MG capsule Take 1 capsule (150 mg total) by mouth daily. 04/24/19  Yes Bonnielee Haff, MD  lisinopril (PRINIVIL,ZESTRIL) 10 MG tablet Take 5 mg by mouth daily as needed (for blood pressure). Son says she takes it as needed when she feels her BP is high, she will check it and then take it if needed.   Yes [provider]  Magnesium 500 MG CAPS Take 500 mg by mouth daily.    Yes [provider]  memantine (NAMENDA) 5 MG tablet Take 5 mg by mouth 2 (two) times daily.    Yes [provider]  methotrexate (RHEUMATREX) 2.5 MG tablet Take 15 mg by mouth every Wednesday.  08/31/15   Yes [provider]  MYRBETRIQ 25 MG TB24 tablet Take 25 mg by mouth daily.  04/05/19  Yes [provider]  omeprazole (PRILOSEC) 20 MG capsule Take 20 mg by mouth 2 (two) times daily before a meal.   Yes [provider]  potassium chloride (K-DUR) 10 MEQ tablet Take 10 mEq by mouth at bedtime.   Yes [provider]  vitamin B-12 1000 MCG tablet Take 1 tablet (1,000 mcg total) by mouth daily. 04/24/19  Yes Bonnielee Haff, MD  Zinc 50 MG CAPS Take 50 mg by mouth daily.    Yes [provider]  denosumab (PROLIA) 60 MG/ML SOLN injection Inject 60 mg into the skin every 6 (six) months. Administer in upper arm, thigh, or abdomen, got in June 2019    [provider]  donepezil (ARICEPT) 10 MG tablet Take 1 tablet (10 mg total) by mouth at bedtime. Patient not taking: Reported on 06/03/2019 10/08/18   Garvin Fila, MD   Dg Chest Port 1 View  Result Date: 06/03/2019 CLINICAL DATA:  Preoperative chest radiograph prior to hip surgery. EXAM: PORTABLE CHEST 1 VIEW COMPARISON:  08/29/2018 FINDINGS: UPPER limits normal heart size noted. There is no evidence of focal airspace disease, pulmonary edema, suspicious pulmonary nodule/mass, pleural effusion, or pneumothorax. No acute bony abnormalities are identified. IMPRESSION: UPPER limits normal heart size without evidence of acute cardiopulmonary disease. Electronically Signed   By: Margarette Canada M.D.   On: 06/03/2019 21:05   Dg Hip Unilat With Pelvis 2-3 Views Left  Result Date: 06/03/2019 CLINICAL DATA:  Acute LEFT hip dislocation. EXAM: DG HIP (WITH OR WITHOUT PELVIS) 2-3V LEFT COMPARISON:  05/28/2019 FINDINGS: LEFT femoral prosthesis noted. SUPERIOR dislocation of the prosthetic LEFT femoral head noted. A few tiny bony fragments in the acetabular cavity are age indeterminate. IMPRESSION: SUPERIOR dislocation of the LEFT femoral head prosthesis. Electronically Signed   By: Margarette Canada M.D.   On: 06/03/2019 14:49     Positive ROS: All other systems have been reviewed and were otherwise negative with the exception of those mentioned in the HPI and as above.  Physical Exam: General: Alert, no acute distress Cardiovascular: No pedal edema Respiratory: No cyanosis, no use of accessory musculature GI: No organomegaly,  abdomen is soft and non-tender Skin: No lesions in the area of chief complaint Neurologic: Sensation intact distally Psychiatric: Oriented to self only Lymphatic: No axillary or cervical lymphadenopathy  MUSCULOSKELETAL: Examination of the left hip reveals a healed posterior incision.  She is shortened and rotated.  The foot is warm and well-perfused.  She refuses to wiggle the toes.  Unable to assess sensation due to mental status.  Assessment: Recurrent instability, left hip hemiarthroplasty  Plan: I discussed the findings with the patient and her daughter.  Unfortunately, she has developed recurrent instability of her left hip hemiarthroplasty.  I think this is due to her cognitive dysfunction and inability to protect the hip.  We discussed treating this with closed reduction versus revision of the hip.  I think the goal would be to limit the amount of anesthetic events to decrease the risk of worsening her dementia.  Therefore, conversion to total hip arthroplasty would be my recommended treatment.  We will plan for a dual mobility bearing.  We did discuss the risk, benefits, and alternatives.  Please see statement of risk.  If she develops recurrent instability, we can convert her to a constrained liner once the cup has ingrown.  N.p.o. after midnight.  Hold chemical DVT prophylaxis.  All questions were solicited and answered.  The risks, benefits, and alternatives were discussed with the patient. There are risks associated with the surgery including, but not limited to, problems with anesthesia (death), infection, instability (giving out of the joint), dislocation, differences in leg  length/angulation/rotation, fracture of bones, loosening or failure of implants, hematoma (blood accumulation) which may require surgical drainage, blood clots, pulmonary embolism, nerve injury (foot drop and lateral thigh numbness), and blood vessel injury. The patient understands these risks and elects to proceed.     Bertram Savin, MD Cell [phone]    06/04/2019 11:57 AM

## 2019-06-04 NOTE — Progress Notes (Signed)
Initial Nutrition Assessment  RD working remotely.   DOCUMENTATION CODES:   Not applicable  INTERVENTION:  - diet advancement as medically feasible. - will monitor for nutrition needs following diet advancement.    NUTRITION DIAGNOSIS:   Increased nutrient needs related to acute illness as evidenced by estimated needs.  GOAL:   Patient will meet greater than or equal to 90% of their needs  MONITOR:   Diet advancement, Labs, Weight trends  REASON FOR ASSESSMENT:   Malnutrition Screening Tool  ASSESSMENT:   78 y.o. female with medical history significant for HTN, anxiety, hypothyroidism, CVA, dementia, left hip fracture s/p operative repair 04/19/2019, left prosthetic hip dislocation reduced in the ED on 05/28/2019, now returning to the ED with recurrent left hip pain. Patient has been in a knee immobilizer at home, has been essentially bedbound, is frequently restless and pulling at her knee immobilizer in bed and then began to complain of pain to the left hip. Daughter was concerned for recurrent dislocation and patient was brought into the ED with EMS. Reduction was attempted with conscious sedation in the ED but unsuccessful despite multiple attempts.  Orthopedic surgery was consulted and recommended admission.  Patient has been NPO since midnight. Per flow sheet, patient is a/o to self only. Patient was assessed in person by another RD on 04/21/19. At that time, RD was able to speak with patient and her daughter at bedside and it was reported that patient had a good appetite although she did need a soft diet as she has loose teeth. Patient does not like milk or super sweet ONS but does like Magic Cup. Per chart review, weight yesterday recorded as 132 lb and weight today as 124 lb.  Per notes: - plan for conversion to total hip arthroplasty    Labs reviewed. Medications reviewed; 0.5 mg folvite/day, 1 g IV Mg sulfate x1 run 9/29, 20 mEq Klor-Con x1 dose 9/29, 1000 mcg oral  cyanocobalamin/day.      NUTRITION - FOCUSED PHYSICAL EXAM:  unable to complete at this time.   Diet Order:   Diet Order            Diet NPO time specified Except for: Sips with Meds, Ice Chips  Diet effective midnight              EDUCATION NEEDS:   No education needs have been identified at this time  Skin:  Skin Assessment: Reviewed RN Assessment  Last BM:  9/29  Height:   Ht Readings from Last 1 Encounters:  06/04/19 5\' 2"  (1.575 m)    Weight:   Wt Readings from Last 1 Encounters:  06/04/19 56.1 kg    Ideal Body Weight:  50 kg  BMI:  Body mass index is 22.62 kg/m.  Estimated Nutritional Needs:   Kcal:  1600-1800 kcal  Protein:  70-80 grams  Fluid:  >/= 1.8 L/day      Jarome Matin, MS, RD, LDN, [facility] Inpatient Clinical Dietitian Pager # [phone] After hours/weekend pager # [phone]

## 2019-06-04 NOTE — Consult Note (Signed)
ORTHOPAEDIC CONSULTATION  REQUESTING PHYSICIAN: Georgette Shell, MD  PCP:  Celene Squibb, MD  Chief Complaint: Dislocated left hip hemiarthroplasty  HPI: [name] is a 78 y.o. female with a history of hypertension, anxiety, hypothyroidism, previous CVA, dementia, left hip fracture status post hemiarthroplasty on April 19, 2019, left prosthetic hip dislocation reduced in the ED on 05/28/2019, who complains of left hip pain and inability to weight-bear.  She has been in a knee immobilizer since her previous dislocation.  She has severe dementia.  She cannot contribute to the history.  I spoke to her daughter on the phone who provided the history.  Past Medical History:  Diagnosis Date  . Anxiety   . Arm fracture, left   . Breast cancer ([place])   . Bulging disc   . Cardioembolic stroke ([place]) [id number]  . Confusion   . Depression   . Difficulty swallowing solids   . GERD (gastroesophageal reflux disease)   . Hypertension   . Hypothyroidism   . Invasive ductal carcinoma of left breast ([place]) 02/24/2014  . Neuropathy   . Osteoporosis   . 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 Rotator cuff disorder, right   . Scoliosis   . Shortness of breath dyspnea    "I have reactive airway disease".   Past Surgical History:  Procedure Laterality Date  . ABDOMINAL HYSTERECTOMY    . Barbie Banner OSTEOTOMY Right 10/28/2014   Procedure: Barbie Banner OSTEOTOMY RIGHT FOOT;  Surgeon: Marcheta Grammes, DPM;  Location: [facility];  Service: Podiatry;  Laterality: Right;  . BIOPSY  05/15/2018   Procedure: BIOPSY;  Surgeon: Rogene Houston, MD;  Location: [facility];  Service: Endoscopy;;  gastric  . BONE BIOPSY  09/29/2015   Procedure: BONE BIOPSY AND BONE CULTURE SECOND TOE RIGHT FOOT;  Surgeon: Caprice Beaver, DPM;  Location: [facility];  Service: Podiatry;;  . BREAST SURGERY Left    lumpectomy and axillary lymph node with re-excision 2 weeks later   . BUNIONECTOMY Right 10/28/2014   Procedure:  SILVER BUNIONECTOMY RIGHT FOOT;  Surgeon: Marcheta Grammes, DPM;  Location: [facility];  Service: Podiatry;  Laterality: Right;  . CATARACT EXTRACTION W/PHACO Left 08/10/2017   Procedure: CATARACT EXTRACTION PHACO AND INTRAOCULAR LENS PLACEMENT LEFT EYE;  Surgeon: Baruch Goldmann, MD;  Location: [facility];  Service: Ophthalmology;  Laterality: Left;  CDE: 13.81  . CATARACT EXTRACTION W/PHACO Right 09/07/2017   Procedure: CATARACT EXTRACTION PHACO AND INTRAOCULAR LENS PLACEMENT RIGHT EYE;  Surgeon: Baruch Goldmann, MD;  Location: [facility];  Service: Ophthalmology;  Laterality: Right;  right  . COLONOSCOPY    . DILATION AND CURETTAGE OF UTERUS    . ESOPHAGEAL DILATION N/A 05/15/2018   Procedure: ESOPHAGEAL DILATION;  Surgeon: Rogene Houston, MD;  [facility];  Service: Endoscopy;  Laterality: N/A;  . esophageal stricture    . ESOPHAGOGASTRODUODENOSCOPY N/A 05/15/2018   Procedure: ESOPHAGOGASTRODUODENOSCOPY (EGD);  Surgeon: Rogene Houston, MD;  [facility];  Service: Endoscopy;  Laterality: N/A;  1:45  . HAMMER TOE SURGERY Right 10/28/2014   Procedure: HAMMER TOE CORRECTION 2ND AND 3RD TOE RIGHT FOOT;  Surgeon: Marcheta Grammes, DPM;  Location: [facility];  Service: Podiatry;  Laterality: Right;  . HIP ARTHROPLASTY Left 04/19/2019   Procedure: ARTHROPLASTY BIPOLAR HIP (HEMIARTHROPLASTY);  Surgeon: Altamese Patriot, MD;  Location: [place];  Service: Orthopedics;  Laterality: Left;  . LUMBAR LAMINECTOMY/DECOMPRESSION MICRODISCECTOMY Left 03/13/2016   Procedure: Left Lumbar Four-Five,  Lumbar Five-Sacral One Laminotomy/Foraminotomy;  Surgeon: Kevan Ny Ditty, MD;  Location: [facility];  Service: Neurosurgery;  Laterality: Left;  . REMOVAL OF IMPLANT Right 09/29/2015   Procedure: REMOVAL OF IMPLANT 2ND TOE RIGHT FOOT;  Surgeon: Caprice Beaver, DPM;  Location: [facility];  Service: Podiatry;  Laterality: Right;  . TEE WITHOUT CARDIOVERSION N/A 08/05/2018   Procedure: TRANSESOPHAGEAL  ECHOCARDIOGRAM (TEE);  Surgeon: Skeet Latch, MD;  Location: [facility] END[facility];  Service: Cardiovascular;  Laterality: N/A;   Social History   Socioeconomic History  . Marital status: Widowed    Spouse name: Not on file  . Number of children: Not on file  . Years of education: Not on file  . Highest education level: Not on file  Occupational History  . Not on file  Social Needs  . Financial resource strain: Not on file  . Food insecurity    Worry: Not on file    Inability: Not on file  . Transportation needs    Medical: Not on file    Non-medical: Not on file  Tobacco Use  . Smoking status: Never Smoker  . Smokeless tobacco: Never Used  Substance and Sexual Activity  . Alcohol use: No  . Drug use: No  . Sexual activity: Never    Birth control/protection: None  Lifestyle  . Physical activity    Days per week: Not on file    Minutes per session: Not on file  . Stress: Not on file  Relationships  . Social Herbalist on phone: Not on file    Gets together: Not on file    Attends religious service: Not on file    Active member of club or organization: Not on file    Attends meetings of clubs or organizations: Not on file    Relationship status: Not on file  Other Topics Concern  . Not on file  Social History Narrative  . Not on file   Family History  Problem Relation Age of Onset  . Hypertension Mother   . Cancer Maternal Grandmother   . Cancer Cousin    Allergies  Allergen Reactions  . Celecoxib Swelling and Other (See Comments)  . Ibuprofen Swelling and Other (See Comments)  . Diphenhydramine Rash and Other (See Comments)    Wide awake.   Micheline Maze [Propoxyphene N-Acetaminophen] Nausea And Vomiting   Prior to Admission medications   Medication Sig Start Date End Date Taking? Authorizing Provider  acetaminophen (TYLENOL) 325 MG tablet Take 500 mg by mouth every 6 (six) hours as needed for mild pain or moderate pain.   Yes [provider]   albuterol (PROAIR HFA) 108 (90 Base) MCG/ACT inhaler Inhale 2 puffs into the lungs every 6 (six) hours as needed for wheezing or shortness of breath.    Yes [provider]  ALPRAZolam (XANAX) 0.25 MG tablet Take 0.25-50 mg by mouth 3 (three) times daily as needed for anxiety.  01/26/19  Yes [provider]  Calcium Carbonate-Vitamin D (CALTRATE 600+D) 600-400 MG-UNIT tablet Take 1 tablet by mouth 2 (two) times daily.   Yes [provider]  cetirizine (ZYRTEC) 10 MG tablet Take 10 mg by mouth daily as needed for allergies.    Yes [provider]  Cholecalciferol (VITAMIN D) 2000 UNITS CAPS Take 2,000 Units by mouth daily.    Yes [provider]  clopidogrel (PLAVIX) 75 MG tablet Take 1 tablet (75 mg total) by mouth daily. 08/07/18  Yes Shelly Coss, MD  Cranberry 125 MG TABS Take 1 tablet by mouth 3 (three) times daily as needed.   Yes [provider]  DULoxetine (CYMBALTA) 60 MG capsule Take 60 mg by mouth daily.   Yes [provider]  folic acid (FOLVITE) Q000111Q MCG tablet Take 800 mcg by mouth daily.   Yes [provider]  HYDROcodone-acetaminophen (NORCO/VICODIN) 5-325 MG tablet Take 1 tablet by mouth every 6 (six) hours as needed for moderate pain or severe pain. 04/23/19  Yes Bonnielee Haff, MD  iron polysaccharides (NIFEREX) 150 MG capsule Take 1 capsule (150 mg total) by mouth daily. 04/24/19  Yes Bonnielee Haff, MD  lisinopril (PRINIVIL,ZESTRIL) 10 MG tablet Take 5 mg by mouth daily as needed (for blood pressure). Son says she takes it as needed when she feels her BP is high, she will check it and then take it if needed.   Yes [provider]  Magnesium 500 MG CAPS Take 500 mg by mouth daily.    Yes [provider]  memantine (NAMENDA) 5 MG tablet Take 5 mg by mouth 2 (two) times daily.    Yes [provider]  methotrexate (RHEUMATREX) 2.5 MG tablet Take 15 mg by mouth every Wednesday.  08/31/15   Yes [provider]  MYRBETRIQ 25 MG TB24 tablet Take 25 mg by mouth daily.  04/05/19  Yes [provider]  omeprazole (PRILOSEC) 20 MG capsule Take 20 mg by mouth 2 (two) times daily before a meal.   Yes [provider]  potassium chloride (K-DUR) 10 MEQ tablet Take 10 mEq by mouth at bedtime.   Yes [provider]  vitamin B-12 1000 MCG tablet Take 1 tablet (1,000 mcg total) by mouth daily. 04/24/19  Yes Bonnielee Haff, MD  Zinc 50 MG CAPS Take 50 mg by mouth daily.    Yes [provider]  denosumab (PROLIA) 60 MG/ML SOLN injection Inject 60 mg into the skin every 6 (six) months. Administer in upper arm, thigh, or abdomen, got in June 2019    [provider]  donepezil (ARICEPT) 10 MG tablet Take 1 tablet (10 mg total) by mouth at bedtime. Patient not taking: Reported on 06/03/2019 10/08/18   Garvin Fila, MD   Dg Chest Port 1 View  Result Date: 06/03/2019 CLINICAL DATA:  Preoperative chest radiograph prior to hip surgery. EXAM: PORTABLE CHEST 1 VIEW COMPARISON:  08/29/2018 FINDINGS: UPPER limits normal heart size noted. There is no evidence of focal airspace disease, pulmonary edema, suspicious pulmonary nodule/mass, pleural effusion, or pneumothorax. No acute bony abnormalities are identified. IMPRESSION: UPPER limits normal heart size without evidence of acute cardiopulmonary disease. Electronically Signed   By: Margarette Canada M.D.   On: 06/03/2019 21:05   Dg Hip Unilat With Pelvis 2-3 Views Left  Result Date: 06/03/2019 CLINICAL DATA:  Acute LEFT hip dislocation. EXAM: DG HIP (WITH OR WITHOUT PELVIS) 2-3V LEFT COMPARISON:  05/28/2019 FINDINGS: LEFT femoral prosthesis noted. SUPERIOR dislocation of the prosthetic LEFT femoral head noted. A few tiny bony fragments in the acetabular cavity are age indeterminate. IMPRESSION: SUPERIOR dislocation of the LEFT femoral head prosthesis. Electronically Signed   By: Margarette Canada M.D.   On: 06/03/2019 14:49     Positive ROS: All other systems have been reviewed and were otherwise negative with the exception of those mentioned in the HPI and as above.  Physical Exam: General: Alert, no acute distress Cardiovascular: No pedal edema Respiratory: No cyanosis, no use of accessory musculature GI: No organomegaly,  abdomen is soft and non-tender Skin: No lesions in the area of chief complaint Neurologic: Sensation intact distally Psychiatric: Oriented to self only Lymphatic: No axillary or cervical lymphadenopathy  MUSCULOSKELETAL: Examination of the left hip reveals a healed posterior incision.  She is shortened and rotated.  The foot is warm and well-perfused.  She refuses to wiggle the toes.  Unable to assess sensation due to mental status.  Assessment: Recurrent instability, left hip hemiarthroplasty  Plan: I discussed the findings with the patient and her daughter.  Unfortunately, she has developed recurrent instability of her left hip hemiarthroplasty.  I think this is due to her cognitive dysfunction and inability to protect the hip.  We discussed treating this with closed reduction versus revision of the hip.  I think the goal would be to limit the amount of anesthetic events to decrease the risk of worsening her dementia.  Therefore, conversion to total hip arthroplasty would be my recommended treatment.  We will plan for a dual mobility bearing.  We did discuss the risk, benefits, and alternatives.  Please see statement of risk.  If she develops recurrent instability, we can convert her to a constrained liner once the cup has ingrown.  N.p.o. after midnight.  Hold chemical DVT prophylaxis.  All questions were solicited and answered.  The risks, benefits, and alternatives were discussed with the patient. There are risks associated with the surgery including, but not limited to, problems with anesthesia (death), infection, instability (giving out of the joint), dislocation, differences in leg  length/angulation/rotation, fracture of bones, loosening or failure of implants, hematoma (blood accumulation) which may require surgical drainage, blood clots, pulmonary embolism, nerve injury (foot drop and lateral thigh numbness), and blood vessel injury. The patient understands these risks and elects to proceed.     Bertram Savin, MD Cell [phone]    06/04/2019 11:57 AM

## 2019-06-04 NOTE — Progress Notes (Signed)
PT Cancellation Note  Patient Details Name: [name] MRN: [id number] DOB: April 08, 1941   Cancelled Treatment:    Reason Eval/Treat Not Completed: Other (comment) Planning to return to OR for THA due to multiple dislocations. Please reorder pt  post op.  Claretha Cooper 06/04/2019, 3:46 PM  [place] Pager [phone] Office [phone]

## 2019-06-04 NOTE — Plan of Care (Signed)
Plan of care discussed with Daughter [name].  Requests foam wedge for between legs at night.

## 2019-06-04 NOTE — Progress Notes (Signed)
PROGRESS NOTE    [name]  [id number] DOB: [birth year] DOA: 06/03/2019 PCP: Celene Squibb, MD  Brief Narrative: 78 y.o. female with medical history significant for hypertension, anxiety, hypothyroidism, history of CVA, dementia, left hip fracture status post operative repair on April 19, 2019, left prosthetic hip dislocation reduced in the ED on 05/28/2019, now returning to the emergency department with recurrent left hip pain.  Patient has difficulty contributing to the history due to her clinical condition with dementia, and her daughter (medical power of attorney) assist with history.  Patient has been in a knee immobilizer at home, has been essentially bedbound, is frequently restless and pulling at her knee immobilizer in bed and then began to complain of pain again today, localizing this to the left hip.  She had not fallen.  There were no other complaints.  Daughter was concerned for recurrent dislocation and patient was brought into the ED with EMS.  ED Course: Upon arrival to the ED, patient is found to be afebrile, saturating well on room air, hypertensive to 200/100.  Radiographs of the left hip demonstrate superior dislocation of the left femoral head prosthesis.  Chemistry panel notable for potassium 3.0 and albumin 2.7.  CBC with stable hemoglobin at 9.9 but new mild macrocytosis.  Chest x-ray with no acute findings.  COVID-19 testing is in process. Reduction was attempted with conscious sedation in the emergency department but unsuccessful despite multiple attempts.  Orthopedic surgery was consulted and recommended medical admission to [facility] Assessment & Plan:   Principal Problem:   Closed dislocation of hip, left, initial encounter [facility]) Active Problems:   Rheumatoid arthritis ([place])   Hypokalemia   Hypertension   Dementia ([place])   History of stroke   Anxiety   #1 left prosthetic hip dislocation she had hemiarthroplasty 04/19/2019 had dislocation reduced in  9/23 now admitted with recurrent dislocation.  Attempts to reduce in the ED was unsuccessful.  Ortho has been consulted.  Ortho planning for surgery tomorrow n.p.o. after midnight.  Hold DVT prophylaxis hold the Plavix.  Continue pain control IV fluids.  #2 history of stroke hold Plavix continue dysphagia 3 diet n.p.o. after midnight  3 hypertension with hypertensive urgency versus due to pain ACE inhibitor on hold for the surgery continue labetalol as needed.  #4 dementia continue Namenda  #5 history of rheumatoid arthritis patient takes methotrexate at home continue  #6 hypokalemia replace and recheck   #7 history of chronic macrocytic anemia no evidence of active bleeding hemoglobin stable      Nutrition Problem: Increased nutrient needs Etiology: acute illness     Signs/Symptoms: estimated needs    Interventions: Refer to RD note for recommendations  Estimated body mass index is 22.62 kg/m as calculated from the following:   Height as of this encounter: 5\' 2"  (1.575 m).   Weight as of this encounter: 56.1 kg.  DVT prophylaxis: SCD Code Status: Full code Family Communication: Discussed with daughter Disposition Plan: Pending OR  Consultants:   Ortho  Procedures none Antimicrobials: None  Subjective: Resting in bed grimacing with pain  Objective: Vitals:   06/03/19 2259 06/04/19 0007 06/04/19 0447 06/04/19 1337  BP:  130/73 (!) 151/70 (!) 134/58  Pulse:  89 84 85  Resp:  18 14 16   Temp: (!) 101 F (38.3 C) 98.9 F (37.2 C) 97.6 F (36.4 C) 99.3 F (37.4 C)  TempSrc: Oral Oral Oral Oral  SpO2:  94% 99%   Weight:  56.1 kg  Height:  5\' 2"  (1.575 m)      Intake/Output Summary (Last 24 hours) at 06/04/2019 1444 Last data filed at 06/04/2019 0600 Gross per 24 hour  Intake 120.87 ml  Output 200 ml  Net -79.13 ml   Filed Weights   06/03/19 1345 06/04/19 0007  Weight: 60 kg 56.1 kg    Examination:  General exam: Appears calm and comfortable   Respiratory system: Clear to auscultation. Respiratory effort normal. Cardiovascular system: S1 & S2 heard, RRR. No JVD, murmurs, rubs, gallops or clicks. No pedal edema. Gastrointestinal system: Abdomen is nondistended, soft and nontender. No organomegaly or masses felt. Normal bowel sounds heard. Central nervous system: Awake mild distress due to pain extremities: Symmetric 5 x 5 power. Skin: No rashes, lesions or ulcers Psychiatry: Unable to assess due to mental status secondary to dementia    Data Reviewed: I have personally reviewed following labs and imaging studies  CBC: Recent Labs  Lab 06/03/19 2049 06/04/19 0328  WBC 7.3 10.6*  HGB 9.9* 10.6*  HCT 32.0* 33.7*  MCV 101.9* 101.8*  PLT 225 [id number]   Basic Metabolic Panel: Recent Labs  Lab 06/03/19 2049 06/04/19 0328  NA 137 139  K 3.0* 3.6  CL 102 105  CO2 27 26  GLUCOSE 112* 122*  BUN 12 13  CREATININE 0.73 0.64  CALCIUM 9.3 9.4   GFR: Estimated Creatinine Clearance: 44.4 mL/min (by C-G formula based on SCr of 0.64 mg/dL). Liver Function Tests: Recent Labs  Lab 06/03/19 2049  AST 30  ALT 17  ALKPHOS 92  BILITOT 1.0  PROT 6.1*  ALBUMIN 2.7*   No results for input(s): LIPASE, AMYLASE in the last 168 hours. No results for input(s): AMMONIA in the last 168 hours. Coagulation Profile: No results for input(s): INR, PROTIME in the last 168 hours. Cardiac Enzymes: No results for input(s): CKTOTAL, CKMB, CKMBINDEX, TROPONINI in the last 168 hours. BNP (last 3 results) No results for input(s): PROBNP in the last 8760 hours. HbA1C: No results for input(s): HGBA1C in the last 72 hours. CBG: Recent Labs  Lab 06/03/19 2343  GLUCAP 124*   Lipid Profile: No results for input(s): CHOL, HDL, LDLCALC, TRIG, CHOLHDL, LDLDIRECT in the last 72 hours. Thyroid Function Tests: No results for input(s): TSH, T4TOTAL, FREET4, T3FREE, THYROIDAB in the last 72 hours. Anemia Panel: No results for input(s): VITAMINB12,  FOLATE, FERRITIN, TIBC, IRON, RETICCTPCT in the last 72 hours. Sepsis Labs: No results for input(s): PROCALCITON, LATICACIDVEN in the last 168 hours.  Recent Results (from the past 240 hour(s))  SARS Coronavirus 2 [facility] order, Performed in [facility] hospital lab) Nasopharyngeal Nasopharyngeal Swab     Status: None   Collection Time: 06/03/19  7:55 PM   Specimen: Nasopharyngeal Swab  Result Value Ref Range Status   SARS Coronavirus 2 NEGATIVE NEGATIVE Final    Comment: (NOTE) If result is NEGATIVE SARS-CoV-2 target nucleic acids are NOT DETECTED. The SARS-CoV-2 RNA is generally detectable in upper and lower  respiratory specimens during the acute phase of infection. The lowest  concentration of SARS-CoV-2 viral copies this assay can detect is 250  copies / mL. A negative result does not preclude SARS-CoV-2 infection  and should not be used as the sole basis for treatment or other  patient management decisions.  A negative result may occur with  improper specimen collection / handling, submission of specimen other  than nasopharyngeal swab, presence of viral mutation(s) within the  areas targeted by this assay, and inadequate  number of viral copies  (<250 copies / mL). A negative result must be combined with clinical  observations, patient history, and epidemiological information. If result is POSITIVE SARS-CoV-2 target nucleic acids are DETECTED. The SARS-CoV-2 RNA is generally detectable in upper and lower  respiratory specimens dur ing the acute phase of infection.  Positive  results are indicative of active infection with SARS-CoV-2.  Clinical  correlation with patient history and other diagnostic information is  necessary to determine patient infection status.  Positive results do  not rule out bacterial infection or co-infection with other viruses. If result is PRESUMPTIVE POSTIVE SARS-CoV-2 nucleic acids MAY BE PRESENT.   A presumptive positive result was obtained on the  submitted specimen  and confirmed on repeat testing.  While 2019 novel coronavirus  (SARS-CoV-2) nucleic acids may be present in the submitted sample  additional confirmatory testing may be necessary for epidemiological  and / or clinical management purposes  to differentiate between  SARS-CoV-2 and other Sarbecovirus currently known to infect humans.  If clinically indicated additional testing with an alternate test  methodology [phone]) is advised. The SARS-CoV-2 RNA is generally  detectable in upper and lower respiratory sp ecimens during the acute  phase of infection. The expected result is Negative. Fact Sheet for Patients:  StrictlyIdeas.no Fact Sheet for Healthcare Providers: BankingDealers.co.za This test is not yet approved or cleared by the [place] FDA and has been authorized for detection and/or diagnosis of SARS-CoV-2 by FDA under an Emergency Use Authorization (EUA).  This EUA will remain in effect (meaning this test can be used) for the duration of the COVID-19 declaration under Section 564(b)(1) of the Act, 21 U.S.C. section 360bbb-3(b)(1), unless the authorization is terminated or revoked sooner. Performed at [facility], [address], [place], [place] [address]   Culture, blood (x 2)     Status: None (Preliminary result)   Collection Time: 06/03/19 11:07 PM   Specimen: BLOOD  Result Value Ref Range Status   Specimen Description BLOOD LEFT ANTECUBITAL  Final   Special Requests   Final    BOTTLES DRAWN AEROBIC AND ANAEROBIC Blood Culture adequate volume   Culture   Final    NO GROWTH < 12 HOURS Performed at [facility], [address] [place], [place] [address]    Report Status PENDING  Incomplete  Culture, blood (x 2)     Status: None (Preliminary result)   Collection Time: 06/03/19 11:08 PM   Specimen: BLOOD  Result Value Ref Range Status   Specimen Description BLOOD RIGHT ANTECUBITAL  Final   Special  Requests   Final    BOTTLES DRAWN AEROBIC AND ANAEROBIC Blood Culture adequate volume   Culture   Final    NO GROWTH < 12 HOURS Performed at [facility], [address] [place], [place] [address]    Report Status PENDING  Incomplete  Surgical PCR screen     Status: Abnormal   Collection Time: 06/04/19  1:33 AM   Specimen: Nasal Mucosa; Nasal Swab  Result Value Ref Range Status   MRSA, PCR POSITIVE (A) NEGATIVE Final    Comment: RESULT CALLED TO, READ BACK BY AND VERIFIED WITH: MCKINNEY, S. @1122  ON 9.30.2020 BY [email or web address]    Staphylococcus aureus POSITIVE (A) NEGATIVE Final    Comment: (NOTE) The Xpert SA Assay (FDA approved for NASAL specimens in patients 62 years of age and older), is one component of a comprehensive surveillance program. It is not intended to diagnose infection nor to guide  or monitor treatment. Performed at [facility], [place] [address] [place], [place] [address]          Radiology Studies: Dg Chest Port 1 View  Result Date: 06/03/2019 CLINICAL DATA:  Preoperative chest radiograph prior to hip surgery. EXAM: PORTABLE CHEST 1 VIEW COMPARISON:  08/29/2018 FINDINGS: UPPER limits normal heart size noted. There is no evidence of focal airspace disease, pulmonary edema, suspicious pulmonary nodule/mass, pleural effusion, or pneumothorax. No acute bony abnormalities are identified. IMPRESSION: UPPER limits normal heart size without evidence of acute cardiopulmonary disease. Electronically Signed   By: Margarette Canada M.D.   On: 06/03/2019 21:05   Dg Hip Unilat With Pelvis 2-3 Views Left  Result Date: 06/03/2019 CLINICAL DATA:  Acute LEFT hip dislocation. EXAM: DG HIP (WITH OR WITHOUT PELVIS) 2-3V LEFT COMPARISON:  05/28/2019 FINDINGS: LEFT femoral prosthesis noted. SUPERIOR dislocation of the prosthetic LEFT femoral head noted. A few tiny bony fragments in the acetabular cavity are age indeterminate. IMPRESSION: SUPERIOR dislocation of the LEFT femoral  head prosthesis. Electronically Signed   By: Margarette Canada M.D.   On: 06/03/2019 14:49        Scheduled Meds: . DULoxetine  60 mg Oral Daily  . folic acid  [id number] mcg Oral Daily  . memantine  5 mg Oral BID  . methotrexate  15 mg Oral Q Wed  . mirabegron ER  25 mg Oral Daily  . mupirocin ointment  1 application Nasal BID  . pantoprazole  40 mg Oral Daily  . cyanocobalamin  1,000 mcg Oral Daily   Continuous Infusions:   LOS: 1 day     Georgette Shell, MD [facility]  If 7PM-7AM, please contact night-coverage [email or web address] Password [email or web address] 06/04/2019, 2:44 PM

## 2019-06-05 ENCOUNTER — Encounter (HOSPITAL_COMMUNITY)
Admission: EM | Disposition: A | Discharge status: Skilled Nursing Facility | Payer: Self-pay | Source: Home / Self Care | Attending: Internal Medicine

## 2019-06-05 ENCOUNTER — Inpatient Hospital Stay (HOSPITAL_COMMUNITY): Payer: PPO | Admitting: Anesthesiology

## 2019-06-05 ENCOUNTER — Inpatient Hospital Stay (HOSPITAL_COMMUNITY): Payer: PPO

## 2019-06-05 ENCOUNTER — Encounter (HOSPITAL_COMMUNITY): Payer: Self-pay | Admitting: *Deleted

## 2019-06-05 HISTORY — PX: TOTAL HIP REVISION: SHX763

## 2019-06-05 LAB — CBC
HCT: 31.2 % — ABNORMAL LOW (ref 36.0–46.0)
HCT: 22.1 % — ABNORMAL LOW (ref 36.0–46.0)
Hemoglobin: 9.2 g/dL — ABNORMAL LOW (ref 12.0–15.0)
Hemoglobin: 6.6 g/dL — CL (ref 12.0–15.0)
MCH: 31.5 pg (ref 26.0–34.0)
MCH: 31.7 pg (ref 26.0–34.0)
MCHC: 29.5 g/dL — ABNORMAL LOW (ref 30.0–36.0)
MCHC: 29.9 g/dL — ABNORMAL LOW (ref 30.0–36.0)
MCV: 106.8 fL — ABNORMAL HIGH (ref 80.0–100.0)
MCV: 106.3 fL — ABNORMAL HIGH (ref 80.0–100.0)
Platelets: 185 10*3/uL (ref 150–400)
Platelets: 243 10*3/uL (ref 150–400)
RBC: 2.92 MIL/uL — ABNORMAL LOW (ref 3.87–5.11)
RBC: 2.08 MIL/uL — ABNORMAL LOW (ref 3.87–5.11)
RDW: 16.9 % — ABNORMAL HIGH (ref 11.5–15.5)
RDW: 16.8 % — ABNORMAL HIGH (ref 11.5–15.5)
WBC: 8.8 10*3/uL (ref 4.0–10.5)
WBC: 18.5 10*3/uL — ABNORMAL HIGH (ref 4.0–10.5)
nRBC: 0 % (ref 0.0–0.2)
nRBC: 0 % (ref 0.0–0.2)

## 2019-06-05 LAB — COMPREHENSIVE METABOLIC PANEL
ALT: 14 U/L (ref 0–44)
AST: 25 U/L (ref 15–41)
Albumin: 2.4 g/dL — ABNORMAL LOW (ref 3.5–5.0)
Alkaline Phosphatase: 76 U/L (ref 38–126)
Anion gap: 6 (ref 5–15)
BUN: 15 mg/dL (ref 8–23)
CO2: 23 mmol/L (ref 22–32)
Calcium: 9 mg/dL (ref 8.9–10.3)
Chloride: 110 mmol/L (ref 98–111)
Creatinine, Ser: 0.71 mg/dL (ref 0.44–1.00)
GFR calc Af Amer: >60 mL/min (ref >60)
GFR calc non Af Amer: >60 mL/min (ref >60)
Glucose, Bld: 108 mg/dL — ABNORMAL HIGH (ref 70–99)
Potassium: 4.3 mmol/L (ref 3.5–5.1)
Sodium: 139 mmol/L (ref 135–145)
Total Bilirubin: 1.1 mg/dL (ref 0.3–1.2)
Total Protein: 5.2 g/dL — ABNORMAL LOW (ref 6.5–8.1)

## 2019-06-05 LAB — PREPARE RBC (CROSSMATCH)

## 2019-06-05 SURGERY — TOTAL HIP REVISION
Anesthesia: General | Site: Hip | Laterality: Left

## 2019-06-05 MED ORDER — POTASSIUM CHLORIDE IN NACL 40-0.9 MEQ/L-% IV SOLN
INTRAVENOUS | Status: DC
  Filled 2019-06-05: qty 1000

## 2019-06-05 MED ORDER — KETOROLAC TROMETHAMINE 30 MG/ML IJ SOLN
INTRAMUSCULAR | Status: DC | PRN
  Administered 2019-06-05: 30 mg via INTRA_ARTICULAR

## 2019-06-05 MED ORDER — STERILE WATER FOR IRRIGATION IR SOLN
Status: DC | PRN
  Administered 2019-06-05: 2000 mL

## 2019-06-05 MED ORDER — LACTATED RINGERS IV SOLN
INTRAVENOUS | Status: DC
  Administered 2019-06-05 (×2): via INTRAVENOUS

## 2019-06-05 MED ORDER — BUPIVACAINE HCL (PF) 0.25 % IJ SOLN
INTRAMUSCULAR | Status: DC | PRN
  Administered 2019-06-05: 30 mL via INTRA_ARTICULAR

## 2019-06-05 MED ORDER — DOCUSATE SODIUM 100 MG PO CAPS
100.0000 mg | ORAL_CAPSULE | Freq: Two times a day (BID) | ORAL | Status: DC
  Administered 2019-06-05 – 2019-06-11 (×9): 100 mg via ORAL
  Filled 2019-06-05 (×13): qty 1

## 2019-06-05 MED ORDER — LIDOCAINE 2% (20 MG/ML) 5 ML SYRINGE
INTRAMUSCULAR | Status: DC | PRN
  Administered 2019-06-05: 60 mg via INTRAVENOUS

## 2019-06-05 MED ORDER — SODIUM CHLORIDE 0.9 % IV SOLN
INTRAVENOUS | Status: DC
  Administered 2019-06-05 – 2019-06-06 (×2): via INTRAVENOUS

## 2019-06-05 MED ORDER — POVIDONE-IODINE 10 % EX SWAB
2.0000 "application " | Freq: Once | CUTANEOUS | Status: AC
  Administered 2019-06-05: 2 via TOPICAL

## 2019-06-05 MED ORDER — FENTANYL CITRATE (PF) 100 MCG/2ML IJ SOLN: 25.0000 ug | INTRAMUSCULAR | Status: DC | PRN

## 2019-06-05 MED ORDER — ISOPROPYL ALCOHOL 70 % SOLN
Status: AC
  Filled 2019-06-05: qty 480

## 2019-06-05 MED ORDER — ONDANSETRON HCL 4 MG/2ML IJ SOLN: 4.0000 mg | Freq: Four times a day (QID) | INTRAMUSCULAR | Status: DC | PRN

## 2019-06-05 MED ORDER — CLOPIDOGREL BISULFATE 75 MG PO TABS
75.0000 mg | ORAL_TABLET | Freq: Every day | ORAL | Status: DC
  Administered 2019-06-06 – 2019-06-12 (×5): 75 mg via ORAL
  Filled 2019-06-05 (×7): qty 1

## 2019-06-05 MED ORDER — FENTANYL CITRATE (PF) 100 MCG/2ML IJ SOLN
INTRAMUSCULAR | Status: AC
  Filled 2019-06-05: qty 2

## 2019-06-05 MED ORDER — METOCLOPRAMIDE HCL 5 MG PO TABS: 5.0000 mg | ORAL_TABLET | Freq: Three times a day (TID) | ORAL | Status: DC | PRN

## 2019-06-05 MED ORDER — METHOCARBAMOL 500 MG IVPB - SIMPLE MED
500.0000 mg | Freq: Four times a day (QID) | INTRAVENOUS | Status: DC | PRN
  Administered 2019-06-06: 500 mg via INTRAVENOUS
  Filled 2019-06-05: qty 50
  Filled 2019-06-05: qty 500

## 2019-06-05 MED ORDER — ROCURONIUM BROMIDE 50 MG/5ML IV SOSY
PREFILLED_SYRINGE | INTRAVENOUS | Status: DC | PRN
  Administered 2019-06-05 (×2): 20 mg via INTRAVENOUS
  Administered 2019-06-05: 50 mg via INTRAVENOUS

## 2019-06-05 MED ORDER — SODIUM CHLORIDE (PF) 0.9 % IJ SOLN
INTRAMUSCULAR | Status: DC | PRN
  Administered 2019-06-05: 30 mL

## 2019-06-05 MED ORDER — SODIUM CHLORIDE 0.9 % IR SOLN
Status: DC | PRN
  Administered 2019-06-05: 1000 mL

## 2019-06-05 MED ORDER — SODIUM CHLORIDE 0.9% IV SOLUTION: Freq: Once | INTRAVENOUS | Status: DC

## 2019-06-05 MED ORDER — PHENYLEPHRINE 40 MCG/ML (10ML) SYRINGE FOR IV PUSH (FOR BLOOD PRESSURE SUPPORT)
PREFILLED_SYRINGE | INTRAVENOUS | Status: AC
  Filled 2019-06-05: qty 10

## 2019-06-05 MED ORDER — BUPIVACAINE HCL (PF) 0.25 % IJ SOLN
INTRAMUSCULAR | Status: AC
  Filled 2019-06-05: qty 30

## 2019-06-05 MED ORDER — PHENOL 1.4 % MT LIQD: 1.0000 | OROMUCOSAL | Status: DC | PRN

## 2019-06-05 MED ORDER — ISOPROPYL ALCOHOL 70 % SOLN
Status: DC | PRN
  Administered 2019-06-05: 1 via TOPICAL

## 2019-06-05 MED ORDER — SODIUM CHLORIDE (PF) 0.9 % IJ SOLN
INTRAMUSCULAR | Status: AC
  Filled 2019-06-05: qty 50

## 2019-06-05 MED ORDER — VANCOMYCIN HCL 1000 MG IV SOLR
INTRAVENOUS | Status: AC
  Filled 2019-06-05: qty 1000

## 2019-06-05 MED ORDER — ONDANSETRON HCL 4 MG/2ML IJ SOLN
INTRAMUSCULAR | Status: AC
  Filled 2019-06-05: qty 2

## 2019-06-05 MED ORDER — POLYETHYLENE GLYCOL 3350 17 G PO PACK: 17.0000 g | PACK | Freq: Every day | ORAL | Status: DC | PRN

## 2019-06-05 MED ORDER — POVIDONE-IODINE 10 % EX SWAB: 2.0000 "application " | Freq: Once | CUTANEOUS | Status: DC

## 2019-06-05 MED ORDER — ENSURE PRE-SURGERY PO LIQD
296.0000 mL | Freq: Once | ORAL | Status: DC
  Filled 2019-06-05: qty 296

## 2019-06-05 MED ORDER — KETOROLAC TROMETHAMINE 30 MG/ML IJ SOLN
INTRAMUSCULAR | Status: AC
  Filled 2019-06-05: qty 1

## 2019-06-05 MED ORDER — FENTANYL CITRATE (PF) 100 MCG/2ML IJ SOLN
INTRAMUSCULAR | Status: DC | PRN
  Administered 2019-06-05 (×4): 50 ug via INTRAVENOUS

## 2019-06-05 MED ORDER — LIDOCAINE 2% (20 MG/ML) 5 ML SYRINGE
INTRAMUSCULAR | Status: AC
  Filled 2019-06-05: qty 5

## 2019-06-05 MED ORDER — DEXAMETHASONE SODIUM PHOSPHATE 10 MG/ML IJ SOLN
10.0000 mg | Freq: Once | INTRAMUSCULAR | Status: AC
  Administered 2019-06-06: 10 mg via INTRAVENOUS
  Filled 2019-06-05: qty 1

## 2019-06-05 MED ORDER — ACETAMINOPHEN 10 MG/ML IV SOLN
INTRAVENOUS | Status: AC
  Filled 2019-06-05: qty 100

## 2019-06-05 MED ORDER — SUGAMMADEX SODIUM 200 MG/2ML IV SOLN
INTRAVENOUS | Status: DC | PRN
  Administered 2019-06-05: 200 mg via INTRAVENOUS

## 2019-06-05 MED ORDER — ONDANSETRON HCL 4 MG PO TABS: 4.0000 mg | ORAL_TABLET | Freq: Four times a day (QID) | ORAL | Status: DC | PRN

## 2019-06-05 MED ORDER — METHOCARBAMOL 500 MG PO TABS
500.0000 mg | ORAL_TABLET | Freq: Four times a day (QID) | ORAL | Status: DC | PRN
  Administered 2019-06-06 – 2019-06-07 (×2): 500 mg via ORAL
  Filled 2019-06-05 (×2): qty 1

## 2019-06-05 MED ORDER — CEFAZOLIN SODIUM-DEXTROSE 2-4 GM/100ML-% IV SOLN
2.0000 g | INTRAVENOUS | Status: AC
  Administered 2019-06-05: 14:00:00 2 g via INTRAVENOUS
  Filled 2019-06-05: qty 100

## 2019-06-05 MED ORDER — VANCOMYCIN HCL 1000 MG IV SOLR
INTRAVENOUS | Status: DC | PRN
  Administered 2019-06-05: 15:00:00 1000 mg via INTRAVENOUS

## 2019-06-05 MED ORDER — OXYCODONE HCL 5 MG PO TABS: 5.0000 mg | ORAL_TABLET | Freq: Once | ORAL | Status: DC | PRN

## 2019-06-05 MED ORDER — BUPIVACAINE-EPINEPHRINE 0.5% -1:200000 IJ SOLN
INTRAMUSCULAR | Status: AC
  Filled 2019-06-05: qty 1

## 2019-06-05 MED ORDER — PROPOFOL 10 MG/ML IV BOLUS
INTRAVENOUS | Status: AC
  Filled 2019-06-05: qty 20

## 2019-06-05 MED ORDER — ACETAMINOPHEN 10 MG/ML IV SOLN
1000.0000 mg | Freq: Once | INTRAVENOUS | Status: DC | PRN
  Administered 2019-06-05: 1000 mg via INTRAVENOUS

## 2019-06-05 MED ORDER — CEFAZOLIN SODIUM-DEXTROSE 2-4 GM/100ML-% IV SOLN
2.0000 g | Freq: Four times a day (QID) | INTRAVENOUS | Status: AC
  Administered 2019-06-05 – 2019-06-06 (×2): 2 g via INTRAVENOUS
  Filled 2019-06-05 (×2): qty 100

## 2019-06-05 MED ORDER — TRANEXAMIC ACID-NACL 1000-0.7 MG/100ML-% IV SOLN
1000.0000 mg | INTRAVENOUS | Status: AC
  Administered 2019-06-05: 1000 mg via INTRAVENOUS
  Filled 2019-06-05: qty 100

## 2019-06-05 MED ORDER — ENOXAPARIN SODIUM 40 MG/0.4ML ~~LOC~~ SOLN
40.0000 mg | SUBCUTANEOUS | Status: DC
  Administered 2019-06-06 – 2019-06-12 (×6): 40 mg via SUBCUTANEOUS
  Filled 2019-06-05 (×6): qty 0.4

## 2019-06-05 MED ORDER — 0.9 % SODIUM CHLORIDE (POUR BTL) OPTIME
TOPICAL | Status: DC | PRN
  Administered 2019-06-05: 1000 mL

## 2019-06-05 MED ORDER — PHENYLEPHRINE 40 MCG/ML (10ML) SYRINGE FOR IV PUSH (FOR BLOOD PRESSURE SUPPORT)
PREFILLED_SYRINGE | INTRAVENOUS | Status: DC | PRN
  Administered 2019-06-05 (×4): 80 ug via INTRAVENOUS

## 2019-06-05 MED ORDER — DEXAMETHASONE SODIUM PHOSPHATE 10 MG/ML IJ SOLN
INTRAMUSCULAR | Status: DC | PRN
  Administered 2019-06-05: 10 mg via INTRAVENOUS

## 2019-06-05 MED ORDER — ALUM & MAG HYDROXIDE-SIMETH 200-200-20 MG/5ML PO SUSP: 30.0000 mL | ORAL | Status: DC | PRN

## 2019-06-05 MED ORDER — DEXAMETHASONE SODIUM PHOSPHATE 10 MG/ML IJ SOLN
INTRAMUSCULAR | Status: AC
  Filled 2019-06-05: qty 1

## 2019-06-05 MED ORDER — CHLORHEXIDINE GLUCONATE 4 % EX LIQD: 60.0000 mL | Freq: Once | CUTANEOUS | Status: DC

## 2019-06-05 MED ORDER — ONDANSETRON HCL 4 MG/2ML IJ SOLN
INTRAMUSCULAR | Status: DC | PRN
  Administered 2019-06-05: 4 mg via INTRAVENOUS

## 2019-06-05 MED ORDER — ROCURONIUM BROMIDE 10 MG/ML (PF) SYRINGE
PREFILLED_SYRINGE | INTRAVENOUS | Status: AC
  Filled 2019-06-05: qty 10

## 2019-06-05 MED ORDER — MENTHOL 3 MG MT LOZG
1.0000 | LOZENGE | OROMUCOSAL | Status: DC | PRN
  Filled 2019-06-05: qty 9

## 2019-06-05 MED ORDER — ALBUMIN HUMAN 5 % IV SOLN
INTRAVENOUS | Status: DC | PRN
  Administered 2019-06-05 (×2): via INTRAVENOUS

## 2019-06-05 MED ORDER — OXYCODONE HCL 5 MG/5ML PO SOLN: 5.0000 mg | Freq: Once | ORAL | Status: DC | PRN

## 2019-06-05 MED ORDER — PROMETHAZINE HCL 25 MG/ML IJ SOLN: 6.2500 mg | INTRAMUSCULAR | Status: DC | PRN

## 2019-06-05 MED ORDER — METOCLOPRAMIDE HCL 5 MG/ML IJ SOLN: 5.0000 mg | Freq: Three times a day (TID) | INTRAMUSCULAR | Status: DC | PRN

## 2019-06-05 MED ORDER — PROPOFOL 10 MG/ML IV BOLUS
INTRAVENOUS | Status: DC | PRN
  Administered 2019-06-05: 80 mg via INTRAVENOUS
  Administered 2019-06-05 (×2): 40 mg via INTRAVENOUS

## 2019-06-05 SURGICAL SUPPLY — 84 items
ADH SKN CLS APL DERMABOND .7 (GAUZE/BANDAGES/DRESSINGS) ×3
APL PRP STRL LF DISP 70% ISPRP (MISCELLANEOUS) ×2
BAG DECANTER FOR FLEXI CONT (MISCELLANEOUS) ×5 IMPLANT
BAG SPEC THK2 15X12 ZIP CLS (MISCELLANEOUS) ×1
BAG ZIPLOCK 12X15 (MISCELLANEOUS) ×3 IMPLANT
BLADE SAGITTAL 25.0X1.37X90 (BLADE) IMPLANT
BLADE SAGITTAL 25.0X1.37X90MM (BLADE)
BLADE SAW SGTL 18X1.27X75 (BLADE) IMPLANT
BLADE SAW SGTL 18X1.27X75MM (BLADE)
BLADE SURG SZ10 CARB STEEL (BLADE) ×2 IMPLANT
BNDG COHESIVE 6X5 TAN STRL LF (GAUZE/BANDAGES/DRESSINGS) ×3 IMPLANT
BRUSH FEMORAL CANAL (MISCELLANEOUS) IMPLANT
CHLORAPREP W/TINT 26 (MISCELLANEOUS) ×5 IMPLANT
COVER SURGICAL LIGHT HANDLE (MISCELLANEOUS) ×3 IMPLANT
COVER WAND RF STERILE (DRAPES) IMPLANT
DERMABOND ADVANCED (GAUZE/BANDAGES/DRESSINGS) ×6
DERMABOND ADVANCED .7 DNX12 (GAUZE/BANDAGES/DRESSINGS) ×1 IMPLANT
DRAPE HIP W/POCKET STRL (MISCELLANEOUS) ×3 IMPLANT
DRAPE IMP U-DRAPE 54X76 (DRAPES) ×6 IMPLANT
DRAPE INCISE IOBAN 66X45 STRL (DRAPES) ×6 IMPLANT
DRAPE ORTHO SPLIT 77X108 STRL (DRAPES) ×3
DRAPE POUCH INSTRU U-SHP 10X18 (DRAPES) ×3 IMPLANT
DRAPE SHEET LG 3/4 BI-LAMINATE (DRAPES) ×9 IMPLANT
DRAPE STERI IOBAN 125X83 (DRAPES) ×3 IMPLANT
DRAPE SURG 17X11 SM STRL (DRAPES) ×3 IMPLANT
DRAPE SURG ORHT 6 SPLT 77X108 (DRAPES) IMPLANT
DRAPE U-SHAPE 47X51 STRL (DRAPES) ×6 IMPLANT
DRSG AQUACEL AG ADV 3.5X 6 (GAUZE/BANDAGES/DRESSINGS) ×2 IMPLANT
DRSG AQUACEL AG ADV 3.5X10 (GAUZE/BANDAGES/DRESSINGS) ×3 IMPLANT
DRSG AQUACEL AG ADV 3.5X14 (GAUZE/BANDAGES/DRESSINGS) ×3 IMPLANT
ELECT BLADE TIP CTD 4 INCH (ELECTRODE) ×3 IMPLANT
ELECT REM PT RETURN 15FT ADLT (MISCELLANEOUS) ×3 IMPLANT
GAUZE SPONGE 2X2 8PLY STRL LF (GAUZE/BANDAGES/DRESSINGS) IMPLANT
GLOVE BIO SURGEON STRL SZ8.5 (GLOVE) ×6 IMPLANT
GLOVE BIOGEL PI IND STRL 8.5 (GLOVE) ×1 IMPLANT
GLOVE BIOGEL PI INDICATOR 8.5 (GLOVE) ×4
GOWN SPEC L3 XXLG W/TWL (GOWN DISPOSABLE) ×3 IMPLANT
HANDPIECE INTERPULSE COAX TIP (DISPOSABLE) ×3
HEAD OSTEO CTAPER L FIT (Head) ×3 IMPLANT
HEAD OSTEO CTAPER L FIT 22 (Head) IMPLANT
HIP STEM RESTOR SZ 9 (Stem) IMPLANT
HOOD PEEL AWAY FLYTE STAYCOOL (MISCELLANEOUS) ×12 IMPLANT
INSERT X3 LINER 22.2 38D HIP (Liner) ×2 IMPLANT
JET LAVAGE IRRISEPT WOUND (IRRIGATION / IRRIGATOR) ×3
KIT BASIN OR (CUSTOM PROCEDURE TRAY) ×3 IMPLANT
KIT TURNOVER KIT A (KITS) IMPLANT
LAVAGE JET IRRISEPT WOUND (IRRIGATION / IRRIGATOR) ×1 IMPLANT
LINER CEMENTLESS SZ D 38 HIP (Liner) ×2 IMPLANT
MANIFOLD NEPTUNE II (INSTRUMENTS) ×3 IMPLANT
MDM Liner-Cementless ×2 IMPLANT
NDL SAFETY ECLIPSE 18X1.5 (NEEDLE) ×1 IMPLANT
NDL SPNL 18GX3.5 QUINCKE PK (NEEDLE) ×1 IMPLANT
NEEDLE HYPO 18GX1.5 SHARP (NEEDLE) ×3
NEEDLE HYPO 22GX1.5 SAFETY (NEEDLE) ×2 IMPLANT
NEEDLE SPNL 18GX3.5 QUINCKE PK (NEEDLE) ×3 IMPLANT
NS IRRIG 1000ML POUR BTL (IV SOLUTION) ×6 IMPLANT
PACK TOTAL JOINT (CUSTOM PROCEDURE TRAY) ×6 IMPLANT
PRESSURIZER FEMORAL UNIV (MISCELLANEOUS) IMPLANT
PROTECTOR NERVE ULNAR (MISCELLANEOUS) ×3 IMPLANT
SCREW HEX LP 6.5X30 (Screw) ×2 IMPLANT
SEALER BIPOLAR AQUA 6.0 (INSTRUMENTS) ×3 IMPLANT
SET HNDPC FAN SPRY TIP SCT (DISPOSABLE) ×1 IMPLANT
SHELL TRIDENT II CLUST 50 (Shell) ×2 IMPLANT
SPONGE GAUZE 2X2 STER 10/PKG (GAUZE/BANDAGES/DRESSINGS)
SPONGE LAP 18X18 RF (DISPOSABLE) ×3 IMPLANT
STAPLER VISISTAT 35W (STAPLE) ×3 IMPLANT
STEM FEMORAL SZ9 15MMX155MM (Stem) ×2 IMPLANT
STEM HIP RESTOR SZ 9 (Stem) IMPLANT
STOCKINETTE 8 INCH (MISCELLANEOUS) ×3 IMPLANT
SUCTION FRAZIER HANDLE 12FR (TUBING) ×2
SUCTION TUBE FRAZIER 12FR DISP (TUBING) ×1 IMPLANT
SUT MNCRL AB 3-0 PS2 18 (SUTURE) IMPLANT
SUT MON AB 2-0 CT1 36 (SUTURE) ×10 IMPLANT
SUT STRATAFIX PDO 1 14 VIOLET (SUTURE) ×6
SUT STRATFX PDO 1 14 VIOLET (SUTURE) ×2
SUT VIC AB 1 CT1 36 (SUTURE) ×6 IMPLANT
SUT VIC AB 2-0 CT1 27 (SUTURE) ×3
SUT VIC AB 2-0 CT1 TAPERPNT 27 (SUTURE) ×1 IMPLANT
SUTURE STRATFX PDO 1 14 VIOLET (SUTURE) ×2 IMPLANT
TOWEL OR 17X26 10 PK STRL BLUE (TOWEL DISPOSABLE) ×6 IMPLANT
TOWER CARTRIDGE SMART MIX (DISPOSABLE) IMPLANT
TRAY FOLEY MTR SLVR 16FR STAT (SET/KITS/TRAYS/PACK) ×3 IMPLANT
WATER STERILE IRR 1000ML POUR (IV SOLUTION) ×3 IMPLANT
YANKAUER SUCT BULB TIP NO VENT (SUCTIONS) ×2 IMPLANT

## 2019-06-05 NOTE — Progress Notes (Signed)
PROGRESS NOTE    [name]  [id number] DOB: September 10, 1940 DOA: 06/03/2019 PCP: Celene Squibb, MD  Brief Narrative:78 y.o.femalewith medical history significant forhypertension, anxiety, hypothyroidism, history of CVA, dementia, left hip fracture status post operative repair on April 19, 2019, left prosthetic hip dislocation reduced in the ED on 05/28/2019, now returning to the emergency department with recurrent left hip pain. Patient has difficulty contributing to the history due to her clinical condition with dementia, and her daughter (medical power of attorney) assist with history. Patient has been in a knee immobilizer at home, has been essentially bedbound, is frequently restless and pulling at her knee immobilizer in bed and then began to complain of pain again today, localizing this to the left hip. She had not fallen. There were no other complaints. Daughter was concerned for recurrent dislocation and patient was brought into the ED with EMS.  ED Course:Upon arrival to the ED, patient is found to be afebrile, saturating well on room air, hypertensive to 200/100.Radiographs of the left hip demonstrate superior dislocation of the left femoral head prosthesis. Chemistry panel notable for potassium 3.0 and albumin 2.7. CBC with stable hemoglobin at 9.9 but new mild macrocytosis. Chest x-ray with no acute findings. COVID-19 testing is in process. Reduction was attempted with conscious sedation in the emergency department but unsuccessful despite multiple attempts. Orthopedic surgery was consulted and recommended medical admission to [facility] long hospital  Assessment & Plan:   Principal Problem:   Closed dislocation of hip, left, initial encounter [facility]) Active Problems:   Rheumatoid arthritis ([place])   Hypokalemia   Hypertension   Dementia ([place])   History of stroke   Anxiety  #1 left prosthetic hip dislocation recurrent- she had hemiarthroplasty 04/19/2019 had dislocation  reduced in 9/23 now admitted with recurrent dislocation.  Attempts to reduce in the ED was unsuccessful.  Ortho planning for posterior total hip revision today.  Continue pain control stool softeners.  Patient takes Plavix at home which has been on hold.  Chemical DVT prophylaxis is also on hold for the surgery.    #2 history of stroke hold Plavix continue dysphagia 3 diet   3 hypertension with hypertensive urgency versus due to pain ACE inhibitor on hold for the surgery continue labetalol as needed.  #4 dementia continue Namenda  #5 history of rheumatoid arthritis patient takes methotrexate at home continue  #6 hypokalemia replace and recheck   #7 history of chronic macrocytic anemia no evidence of active bleeding hemoglobin stable   DVT prophylaxis: SCD Code Status: Full code Family Communication: Discussed with daughter Disposition Plan: Pending OR  Consultants:   Ortho  Procedures none Antimicrobials: None     Nutrition Problem: Increased nutrient needs Etiology: acute illness     Signs/Symptoms: estimated needs    Interventions: Refer to RD note for recommendations  Estimated body mass index is 22.62 kg/m as calculated from the following:   Height as of this encounter: 5\' 2"  (1.575 m).   Weight as of this encounter: 56.1 kg.   Subjective:  Resting in bed oral mucosa dry Objective: Vitals:   06/04/19 1337 06/04/19 2049 06/05/19 0520 06/05/19 1309  BP: (!) 134/58 107/69 (!) 148/82 126/64  Pulse: 85 98 86 83  Resp: 16 18 14 14   Temp: 99.3 F (37.4 C) 99.2 F (37.3 C) 97.9 F (36.6 C) (!) 97.4 F (36.3 C)  TempSrc: Oral Oral Oral Oral  SpO2:  96% 97% 93%  Weight:      Height:  Intake/Output Summary (Last 24 hours) at 06/05/2019 1542 Last data filed at 06/05/2019 1531 Gross per 24 hour  Intake 2061.81 ml  Output 400 ml  Net 1661.81 ml   Filed Weights   06/03/19 1345 06/04/19 0007  Weight: 60 kg 56.1 kg    Examination:   General exam: Appears calm and comfortable  Respiratory system: Clear to auscultation. Respiratory effort normal. Cardiovascular system: S1 & S2 heard, RRR. No JVD, murmurs, rubs, gallops or clicks. No pedal edema. Gastrointestinal system: Abdomen is nondistended, soft and nontender. No organomegaly or masses felt. Normal bowel sounds heard. Central nervous system: Alert and oriented. No focal neurological deficits. Extremities: No edema Skin: No rashes, lesions or ulcers Psychiatry: Judgement and insight appear normal. Mood & affect appropriate.     Data Reviewed: I have personally reviewed following labs and imaging studies  CBC: Recent Labs  Lab 06/03/19 2049 06/04/19 0328 06/05/19 0321  WBC 7.3 10.6* 8.8  HGB 9.9* 10.6* 9.2*  HCT 32.0* 33.7* 31.2*  MCV 101.9* 101.8* 106.8*  PLT 225 190 [id number]   Basic Metabolic Panel: Recent Labs  Lab 06/03/19 2049 06/04/19 0328 06/05/19 0321  NA 137 139 139  K 3.0* 3.6 4.3  CL 102 105 110  CO2 27 26 23   GLUCOSE 112* 122* 108*  BUN 12 13 15   CREATININE 0.73 0.64 0.71  CALCIUM 9.3 9.4 9.0   GFR: Estimated Creatinine Clearance: 44.4 mL/min (by C-G formula based on SCr of 0.71 mg/dL). Liver Function Tests: Recent Labs  Lab 06/03/19 2049 06/05/19 0321  AST 30 25  ALT 17 14  ALKPHOS 92 76  BILITOT 1.0 1.1  PROT 6.1* 5.2*  ALBUMIN 2.7* 2.4*   No results for input(s): LIPASE, AMYLASE in the last 168 hours. No results for input(s): AMMONIA in the last 168 hours. Coagulation Profile: No results for input(s): INR, PROTIME in the last 168 hours. Cardiac Enzymes: No results for input(s): CKTOTAL, CKMB, CKMBINDEX, TROPONINI in the last 168 hours. BNP (last 3 results) No results for input(s): PROBNP in the last 8760 hours. HbA1C: No results for input(s): HGBA1C in the last 72 hours. CBG: Recent Labs  Lab 06/03/19 2343  GLUCAP 124*   Lipid Profile: No results for input(s): CHOL, HDL, LDLCALC, TRIG, CHOLHDL, LDLDIRECT in the  last 72 hours. Thyroid Function Tests: No results for input(s): TSH, T4TOTAL, FREET4, T3FREE, THYROIDAB in the last 72 hours. Anemia Panel: No results for input(s): VITAMINB12, FOLATE, FERRITIN, TIBC, IRON, RETICCTPCT in the last 72 hours. Sepsis Labs: No results for input(s): PROCALCITON, LATICACIDVEN in the last 168 hours.  Recent Results (from the past 240 hour(s))  SARS Coronavirus 2 [facility] order, Performed in [facility] hospital lab) Nasopharyngeal Nasopharyngeal Swab     Status: None   Collection Time: 06/03/19  7:55 PM   Specimen: Nasopharyngeal Swab  Result Value Ref Range Status   SARS Coronavirus 2 NEGATIVE NEGATIVE Final    Comment: (NOTE) If result is NEGATIVE SARS-CoV-2 target nucleic acids are NOT DETECTED. The SARS-CoV-2 RNA is generally detectable in upper and lower  respiratory specimens during the acute phase of infection. The lowest  concentration of SARS-CoV-2 viral copies this assay can detect is 250  copies / mL. A negative result does not preclude SARS-CoV-2 infection  and should not be used as the sole basis for treatment or other  patient management decisions.  A negative result may occur with  improper specimen collection / handling, submission of specimen other  than nasopharyngeal swab, presence of  viral mutation(s) within the  areas targeted by this assay, and inadequate number of viral copies  (<250 copies / mL). A negative result must be combined with clinical  observations, patient history, and epidemiological information. If result is POSITIVE SARS-CoV-2 target nucleic acids are DETECTED. The SARS-CoV-2 RNA is generally detectable in upper and lower  respiratory specimens dur ing the acute phase of infection.  Positive  results are indicative of active infection with SARS-CoV-2.  Clinical  correlation with patient history and other diagnostic information is  necessary to determine patient infection status.  Positive results do  not rule out  bacterial infection or co-infection with other viruses. If result is PRESUMPTIVE POSTIVE SARS-CoV-2 nucleic acids MAY BE PRESENT.   A presumptive positive result was obtained on the submitted specimen  and confirmed on repeat testing.  While 2019 novel coronavirus  (SARS-CoV-2) nucleic acids may be present in the submitted sample  additional confirmatory testing may be necessary for epidemiological  and / or clinical management purposes  to differentiate between  SARS-CoV-2 and other Sarbecovirus currently known to infect humans.  If clinically indicated additional testing with an alternate test  methodology [phone]) is advised. The SARS-CoV-2 RNA is generally  detectable in upper and lower respiratory sp ecimens during the acute  phase of infection. The expected result is Negative. Fact Sheet for Patients:  StrictlyIdeas.no Fact Sheet for Healthcare Providers: BankingDealers.co.za This test is not yet approved or cleared by the [place] FDA and has been authorized for detection and/or diagnosis of SARS-CoV-2 by FDA under an Emergency Use Authorization (EUA).  This EUA will remain in effect (meaning this test can be used) for the duration of the COVID-19 declaration under Section 564(b)(1) of the Act, 21 U.S.C. section 360bbb-3(b)(1), unless the authorization is terminated or revoked sooner. Performed at [facility], [address] [place], [place] [address]   Culture, blood (x 2)     Status: None (Preliminary result)   Collection Time: 06/03/19 11:07 PM   Specimen: BLOOD  Result Value Ref Range Status   Specimen Description BLOOD LEFT ANTECUBITAL  Final   Special Requests   Final    BOTTLES DRAWN AEROBIC AND ANAEROBIC Blood Culture adequate volume   Culture   Final    NO GROWTH 2 DAYS Performed at [facility], [address], [place], [place] [address]    Report Status PENDING  Incomplete  Culture, blood (x 2)     Status:  None (Preliminary result)   Collection Time: 06/03/19 11:08 PM   Specimen: BLOOD  Result Value Ref Range Status   Specimen Description BLOOD RIGHT ANTECUBITAL  Final   Special Requests   Final    BOTTLES DRAWN AEROBIC AND ANAEROBIC Blood Culture adequate volume   Culture   Final    NO GROWTH 2 DAYS Performed at [facility], [address], [place], [place] [address]    Report Status PENDING  Incomplete  Surgical PCR screen     Status: Abnormal   Collection Time: 06/04/19  1:33 AM   Specimen: Nasal Mucosa; Nasal Swab  Result Value Ref Range Status   MRSA, PCR POSITIVE (A) NEGATIVE Final    Comment: RESULT CALLED TO, READ BACK BY AND VERIFIED WITH: MCKINNEY, S. @1122  ON 9.30.2020 BY NMCCOY    Staphylococcus aureus POSITIVE (A) NEGATIVE Final    Comment: (NOTE) The Xpert SA Assay (FDA approved for NASAL specimens in patients 40 years of age and older), is one component of a comprehensive surveillance program.  It is not intended to diagnose infection nor to guide or monitor treatment. Performed at [facility], [place] [address] [place], [place] [address]          Radiology Studies: Dg Chest Port 1 View  Result Date: 06/03/2019 CLINICAL DATA:  Preoperative chest radiograph prior to hip surgery. EXAM: PORTABLE CHEST 1 VIEW COMPARISON:  08/29/2018 FINDINGS: UPPER limits normal heart size noted. There is no evidence of focal airspace disease, pulmonary edema, suspicious pulmonary nodule/mass, pleural effusion, or pneumothorax. No acute bony abnormalities are identified. IMPRESSION: UPPER limits normal heart size without evidence of acute cardiopulmonary disease. Electronically Signed   By: Margarette Canada M.D.   On: 06/03/2019 21:05        Scheduled Meds: . chlorhexidine  60 mL Topical Once  . [MAR Hold] DULoxetine  60 mg Oral Daily  . feeding supplement  296 mL Oral Once  . [MAR Hold] folic acid  [id number] mcg Oral Daily  . [MAR Hold] memantine  5 mg Oral BID  .  [MAR Hold] methotrexate  15 mg Oral Q Wed  . [MAR Hold] mirabegron ER  25 mg Oral Daily  . [MAR Hold] mupirocin ointment  1 application Nasal BID  . [MAR Hold] pantoprazole  40 mg Oral Daily  . povidone-iodine  2 application Topical Once  . [MAR Hold] cyanocobalamin  1,000 mcg Oral Daily   Continuous Infusions: . lactated ringers 50 mL/hr at 06/05/19 1315     LOS: 2 days     Georgette Shell, MD [facility]    If 7PM-7AM, please contact night-coverage [email or web address] Password [facility] 06/05/2019, 3:42 PM

## 2019-06-05 NOTE — Interval H&P Note (Signed)
History and Physical Interval Note:  06/05/2019 2:00 PM  [name]  has presented today for surgery, with the diagnosis of dislocated left hip.  The various methods of treatment have been discussed with the patient and family. After consideration of risks, benefits and other options for treatment, the patient has consented to  Procedure(s): POSTERIOR TOTAL HIP REVISION (Left) as a surgical intervention.  The patient's history has been reviewed, patient examined, no change in status, stable for surgery.  I have reviewed the patient's chart and labs.  Questions were answered to the patient's / family's satisfaction.     Hilton Cork Cosimo Schertzer

## 2019-06-05 NOTE — Anesthesia Preprocedure Evaluation (Addendum)
Anesthesia Evaluation  Patient identified by MRN, date of birth, ID band Patient awake    Reviewed: Allergy & Precautions, NPO status , Patient's Chart, lab work & pertinent test results  History of Anesthesia Complications Negative for: history of anesthetic complications  Airway Mallampati: II   Neck ROM: Full  Mouth opening: Limited Mouth Opening  Dental no notable dental hx.    Pulmonary asthma ,    Pulmonary exam normal        Cardiovascular hypertension, Pt. on medications Normal cardiovascular exam     Neuro/Psych Anxiety Depression Dementia CVA (07/2018, on Plavix) negative psych ROS   GI/Hepatic Neg liver ROS, GERD  Medicated and Controlled,  Endo/Other  Hypothyroidism   Renal/GU negative Renal ROS  negative genitourinary   Musculoskeletal  (+) Arthritis , Rheumatoid disorders,    Abdominal   Peds  Hematology  (+) anemia , Hgb 9.2   Anesthesia Other Findings Day of surgery medications reviewed with patient.  Reproductive/Obstetrics negative OB ROS                            Anesthesia Physical Anesthesia Plan  ASA: III  Anesthesia Plan: General   Post-op Pain Management:    Induction: Intravenous  PONV Risk Score and Plan: 4 or greater and Treatment may vary due to age or medical condition, Ondansetron and Dexamethasone  Airway Management Planned: Oral ETT  Additional Equipment: None  Intra-op Plan:   Post-operative Plan: Extubation in OR  Informed Consent: I have reviewed the patients History and Physical, chart, labs and discussed the procedure including the risks, benefits and alternatives for the proposed anesthesia with the patient or authorized representative who has indicated his/her understanding and acceptance.     Dental advisory given  Plan Discussed with: CRNA  Anesthesia Plan Comments:        Anesthesia Quick Evaluation

## 2019-06-05 NOTE — Anesthesia Procedure Notes (Signed)
Procedure Name: Intubation Date/Time: 06/05/2019 2:08 PM Performed by: Genelle Bal, CRNA Pre-anesthesia Checklist: Patient identified, Emergency Drugs available, Suction available and Patient being monitored Patient Re-evaluated:Patient Re-evaluated prior to induction Oxygen Delivery Method: Circle system utilized Preoxygenation: Pre-oxygenation with 100% oxygen Induction Type: IV induction Ventilation: Mask ventilation without difficulty Laryngoscope Size: Glidescope and 3 Grade View: Grade I Tube type: Oral Tube size: 7.0 mm Number of attempts: 1 Airway Equipment and Method: Stylet and Oral airway Placement Confirmation: ETT inserted through vocal cords under direct vision,  positive ETCO2 and breath sounds checked- equal and bilateral Secured at: 21 cm Tube secured with: Tape Dental Injury: Teeth and Oropharynx as per pre-operative assessment  Comments: Poor dentition, elective glidescope intubation

## 2019-06-05 NOTE — Op Note (Signed)
OPERATIVE REPORT   06/05/2019  5:08 PM  PATIENT:  [name]   SURGEON:  Bertram Savin, MD  ASSISTANT:  Staff.   PREOPERATIVE DIAGNOSIS: Recurrent instability, left hip hemiarthroplasty  POSTOPERATIVE DIAGNOSIS:  Same.  PROCEDURE: Conversion of previous surgery to total hip arthroplasty.  ANESTHESIA:   GETA.  ANTIBIOTICS: 2 g Ancef. 1 g vancomycin.  IMPLANTS: Stryker Trident II Tritanium cluster hole acetabular shell, size 50 mm, D. Cementless MDM liner, 38 mm, D. Stryker restoration HA hip stem, size 9, 155 mm. Stryker 22 mm +0 metal head ball with 38D MDM insert. 6.5 mm cancellus bone screw x1.  EXPLANTS: DePuy [place] basic hip fracture stem size 3 with 46 mm monopolar head and +5 mm spacer.  SPECIMENS: None.  COMPLICATIONS: None.  DISPOSITION: Stable to PACU.  SURGICAL INDICATIONS:  [name] is a 78 y.o. female with severe dementia who underwent left hip hemiarthroplasty posterior approach by Dr. Marcelino Scot about 6 weeks ago.  She has had 2 episodes of hip dislocation.  The last occurred while in a knee immobilizer.  The patient came in while Dr. Mardelle Matte was on call, and he asked me to take over given my training and complex adult reconstruction issues.  I talked to the patient's daughter over the phone, and we discussed conversion to total hip arthroplasty in an attempt to limit the amount of anesthetics associated with repeat close reductions.  We did go over the risks, benefits, and alternatives.  The risks, benefits, and alternatives were discussed with the patient. There are risks associated with the surgery including, but not limited to, problems with anesthesia (death), infection, instability (giving out of the joint), dislocation, differences in leg length/angulation/rotation, fracture of bones, loosening or failure of implants, hematoma (blood accumulation) which may require surgical drainage, blood clots, pulmonary embolism, nerve injury (foot drop and  lateral thigh numbness), and blood vessel injury. The patient understands these risks and elects to proceed.  PROCEDURE IN DETAIL: The patient was identified in the holding area using 2 identifiers.  The surgical site was marked by myself.  She was taken to the operating room, placed supine on the operating table.  General anesthesia was induced.  She was then flipped to the right lateral decubitus position.  Axillary roll was placed.  She was positioned with a hip positioner.  All bony prominences were well-padded.  The left hip was prepped and draped in the normal sterile surgical fashion.  Timeout was called, verifying site and site of surgery.  The hip was reduced very easily with traction and rotation.    Her incision was somewhat anterior, so I used a 10 blade to sharply excise her previous incision.  The incision was carried a little bit in line distally.  Proximally, I made a 90 degree angle and carried the incision posterior.  Blunt dissection was performed until I reached the IT band, which I split in line with fibers.  I identified her failed posterior repair.  The sutures pulled out of the greater trochanter, which fractured the posterior lip of the trochanter.  There is no purulence or evidence of infection.  The hip was easily dislocated with a bone hook.  I delivered the proximal femur into the wound.  Using the bone tamp, I removed the head ball and spacer.  The femoral component was grossly loose.  I cleared the shoulder of all soft tissue.  The femoral component was easily removed with a loop extractor.  I then reamed up  to a 15 mm reamer and broached for a size 9 restoration HA stem.  The femur was then subluxated anteriorly, and acetabular retractors were placed.  I removed the pulmonary and labrum with Bovie electrocautery.  I sequentially reamed up to a size 49 reamer.  A 50 mm cup was opened and impacted into place at approximately 40 degrees of abduction and 20 degrees of anteversion.   The cup achieved adequate press-fit fixation, and I elected to augment this with a single 6.5 mm cancellus bone screw.  The real MDM liner was placed.  Acetabular retractors were removed.  The proximal femur was delivered into the wound.  I placed the real femoral component.  Trial reduction was performed.  The hip was taken through a range of motion.  In the position of neutral abduction and 90 degrees of flexion, the hip was stable to 90 degrees of internal rotation.  In extension and external rotation, there was no impingement or instability.  The hip was stable in potty position.  The trial head ball was removed.  The real dual mobility head ball construct was assembled onto the back table and impacted onto the trunnion.  The hip was reduced.  Stability testing was repeated and found to be unchanged.  Portable AP pelvis x-ray was obtained confirming hardware placement.  The hip was irrigated with Irrisept solution followed by normal saline with pulse lavage.  I repaired the posterior capsule and external rotators with #1 Vicryl suture.  The IT band was repaired with #1 Vicryl and #1 strata fix.  Deep fatty closure was performed with #1 Vicryl.  Deep dermal layer was closed with 2-0 Monocryl.  Skin was reapproximated with staples.  Dermabond was applied.  Once the glue was dried, Aquacel AG dressing was applied.  The patient was then flipped supine, extubated, and taken to the PACU in stable condition.  Sponge, needle, and instrument counts were correct at the end of the case x2. There were no known complications.  POSTOPERATIVE PLAN: The patient be readmitted to the hospitalist.  She may weight-bear as tolerated with a walker.  Posterior hip precautions.  We will order a hip abduction brace from [employer].  Maintain hip abduction brace at all times.  Okay to resume Plavix.  We will place her on Lovenox for DVT prophylaxis for now.  Mobilize out of bed with physical therapy.  She will undergo  disposition planning.  Return to the office for routine two-week postoperative care.

## 2019-06-05 NOTE — Transfer of Care (Signed)
Immediate Anesthesia Transfer of Care Note  Patient: [name]  Procedure(s) Performed: Conversion of previous surgery to total hip replacement (Left Hip)  Patient Location: PACU  Anesthesia Type:General  Level of Consciousness: awake and patient cooperative  Airway & Oxygen Therapy: Patient Spontanous Breathing and Patient connected to face mask oxygen  Post-op Assessment: Report given to RN and Post -op Vital signs reviewed and stable  Post vital signs: Reviewed and stable  Last Vitals:  Vitals Value Taken Time  BP 109/56 06/05/19 1732  Temp    Pulse 86 06/05/19 1734  Resp 19 06/05/19 1734  SpO2 98 % 06/05/19 1734  Vitals shown include unvalidated device data.  Last Pain:  Vitals:   06/05/19 1309  TempSrc: Oral  PainSc:          Complications: No apparent anesthesia complications

## 2019-06-05 NOTE — Anesthesia Postprocedure Evaluation (Signed)
Anesthesia Post Note  Patient: [name]  Procedure(s) Performed: Conversion of previous surgery to total hip replacement (Left Hip)     Patient location during evaluation: PACU Anesthesia Type: General Level of consciousness: awake and alert and oriented Pain management: pain level controlled Vital Signs Assessment: post-procedure vital signs reviewed and stable Respiratory status: spontaneous breathing, nonlabored ventilation and respiratory function stable Cardiovascular status: blood pressure returned to baseline Postop Assessment: no apparent nausea or vomiting Anesthetic complications: no    Last Vitals:  Vitals:   06/05/19 1732 06/05/19 1745  BP: (!) 109/56 112/76  Pulse: 86 87  Resp: 15 13  Temp: 36.8 C   SpO2: 100% 99%    Last Pain:  Vitals:   06/05/19 1732  TempSrc:   PainSc: [place]

## 2019-06-05 NOTE — Plan of Care (Signed)
  Problem: Clinical Measurements: Goal: Will remain free from infection Outcome: Progressing Goal: Respiratory complications will improve Outcome: Progressing Goal: Cardiovascular complication will be avoided Outcome: Progressing   Problem: Elimination: Goal: Will not experience complications related to bowel motility Outcome: Progressing Goal: Will not experience complications related to urinary retention Outcome: Progressing   Problem: Pain Managment: Goal: General experience of comfort will improve Outcome: Progressing   Problem: Safety: Goal: Ability to remain free from injury will improve Outcome: Progressing   

## 2019-06-05 NOTE — Discharge Instructions (Signed)
°Dr. Raschelle Wisenbaker °Joint Replacement Specialist °[facility] °[address] °[place], [place] [address] °[phone] ° ° °TOTAL HIP REPLACEMENT POSTOPERATIVE DIRECTIONS ° ° ° °Hip Rehabilitation, Guidelines Following Surgery  ° °WEIGHT BEARING °Weight bearing as tolerated with assist device (walker, cane, etc) as directed, use it as long as suggested by your surgeon or therapist, typically at least 4-6 weeks. ° °The results of a hip operation are greatly improved after range of motion and muscle strengthening exercises. Follow all safety measures which are given to protect your hip. If any of these exercises cause increased pain or swelling in your joint, decrease the amount until you are comfortable again. Then slowly increase the exercises. Call your caregiver if you have problems or questions.  ° °HOME CARE INSTRUCTIONS  °Most of the following instructions are designed to prevent the dislocation of your new hip.  °Remove items at home which could result in a fall. This includes throw rugs or furniture in walking pathways.  °Continue medications as instructed at time of discharge. °· You may have some home medications which will be placed on hold until you complete the course of blood thinner medication. °· You may start showering once you are discharged home. Do not remove your dressing. °Do not put on socks or shoes without following the instructions of your caregivers.   °Sit on chairs with arms. Use the chair arms to help push yourself up when arising.  °Arrange for the use of a toilet seat elevator so you are not sitting low.  °· Walk with walker as instructed.  °You may resume a sexual relationship in one month or when given the OK by your caregiver.  °Use walker as long as suggested by your caregivers.  °You may put full weight on your legs and walk as much as is comfortable. °Avoid periods of inactivity such as sitting longer than an hour when not asleep. This helps prevent  blood clots.  °You may return to work once you are cleared by your surgeon.  °Do not drive a car for 6 weeks or until released by your surgeon.  °Do not drive while taking narcotics.  °Wear elastic stockings for two weeks following surgery during the day but you may remove then at night.  °Make sure you keep all of your appointments after your operation with all of your doctors and caregivers. You should call the office at the above phone number and make an appointment for approximately two weeks after the date of your surgery. °Please pick up a stool softener and laxative for home use as long as you are requiring pain medications. °· ICE to the affected hip every three hours for 30 minutes at a time and then as needed for pain and swelling. Continue to use ice on the hip for pain and swelling from surgery. You may notice swelling that will progress down to the foot and ankle.  This is normal after surgery.  Elevate the leg when you are not up walking on it.   °It is important for you to complete the blood thinner medication as prescribed by your doctor. °· Continue to use the breathing machine which will help keep your temperature down.  It is common for your temperature to cycle up and down following surgery, especially at night when you are not up moving around and exerting yourself.  The breathing machine keeps your lungs expanded and your temperature down. ° °RANGE OF MOTION AND STRENGTHENING EXERCISES  °These exercises are   designed to help you keep full movement of your hip joint. Follow your caregiver's or physical therapist's instructions. Perform all exercises about fifteen times, three times per day or as directed. Exercise both hips, even if you have had only one joint replacement. These exercises can be done on a training (exercise) mat, on the floor, on a table or on a bed. Use whatever works the best and is most comfortable for you. Use music or television while you are exercising so that the exercises  are a pleasant break in your day. This will make your life better with the exercises acting as a break in routine you can look forward to.  °Lying on your back, slowly slide your foot toward your buttocks, raising your knee up off the floor. Then slowly slide your foot back down until your leg is straight again.  °Lying on your back spread your legs as far apart as you can without causing discomfort.  °Lying on your side, raise your upper leg and foot straight up from the floor as far as is comfortable. Slowly lower the leg and repeat.  °Lying on your back, tighten up the muscle in the front of your thigh (quadriceps muscles). You can do this by keeping your leg straight and trying to raise your heel off the floor. This helps strengthen the largest muscle supporting your knee.  °Lying on your back, tighten up the muscles of your buttocks both with the legs straight and with the knee bent at a comfortable angle while keeping your heel on the floor.  ° °SKILLED REHAB INSTRUCTIONS: °If the patient is transferred to a skilled rehab facility following release from the hospital, a list of the current medications will be sent to the facility for the patient to continue.  When discharged from the skilled rehab facility, please have the facility set up the patient's Home Health Physical Therapy prior to being released. Also, the skilled facility will be responsible for providing the patient with their medications at time of release from the facility to include their pain medication and their blood thinner medication. If the patient is still at the rehab facility at time of the two week follow up appointment, the skilled rehab facility will also need to assist the patient in arranging follow up appointment in our office and any transportation needs. ° °MAKE SURE YOU:  °Understand these instructions.  °Will watch your condition.  °Will get help right away if you are not doing well or get worse. ° °Pick up stool softner and  laxative for home use following surgery while on pain medications. °Do not remove your dressing. °The dressing is waterproof--it is OK to take showers. °Continue to use ice for pain and swelling after surgery. °Do not use any lotions or creams on the incision until instructed by your surgeon. °Total Hip Protocol. ° ° °

## 2019-06-06 LAB — GLUCOSE, CAPILLARY: Glucose-Capillary: 144 mg/dL — ABNORMAL HIGH (ref 70–99)

## 2019-06-06 LAB — COMPREHENSIVE METABOLIC PANEL
ALT: 18 U/L (ref 0–44)
AST: 34 U/L (ref 15–41)
Albumin: 2.8 g/dL — ABNORMAL LOW (ref 3.5–5.0)
Alkaline Phosphatase: 60 U/L (ref 38–126)
Anion gap: 9 (ref 5–15)
BUN: 16 mg/dL (ref 8–23)
CO2: 20 mmol/L — ABNORMAL LOW (ref 22–32)
Calcium: 8.8 mg/dL — ABNORMAL LOW (ref 8.9–10.3)
Chloride: 111 mmol/L (ref 98–111)
Creatinine, Ser: 0.68 mg/dL (ref 0.44–1.00)
GFR calc Af Amer: >60 mL/min (ref >60)
GFR calc non Af Amer: >60 mL/min (ref >60)
Glucose, Bld: 166 mg/dL — ABNORMAL HIGH (ref 70–99)
Potassium: 4.7 mmol/L (ref 3.5–5.1)
Sodium: 140 mmol/L (ref 135–145)
Total Bilirubin: 1 mg/dL (ref 0.3–1.2)
Total Protein: 5 g/dL — ABNORMAL LOW (ref 6.5–8.1)

## 2019-06-06 LAB — CBC
HCT: 26.8 % — ABNORMAL LOW (ref 36.0–46.0)
Hemoglobin: 8.2 g/dL — ABNORMAL LOW (ref 12.0–15.0)
MCH: 28.5 pg (ref 26.0–34.0)
MCHC: 30.6 g/dL (ref 30.0–36.0)
MCV: 93.1 fL (ref 80.0–100.0)
Platelets: 172 10*3/uL (ref 150–400)
RBC: 2.88 MIL/uL — ABNORMAL LOW (ref 3.87–5.11)
RDW: 22.7 % — ABNORMAL HIGH (ref 11.5–15.5)
WBC: 10.9 10*3/uL — ABNORMAL HIGH (ref 4.0–10.5)
nRBC: 0 % (ref 0.0–0.2)

## 2019-06-06 MED ORDER — HYDROCODONE-ACETAMINOPHEN 5-325 MG PO TABS: 1.0000 | ORAL_TABLET | ORAL | 0 refills | Status: DC | PRN

## 2019-06-06 MED ORDER — LACTATED RINGERS IV SOLN
INTRAVENOUS | Status: DC
  Administered 2019-06-06 – 2019-06-07 (×2): via INTRAVENOUS

## 2019-06-06 MED ORDER — ENOXAPARIN SODIUM 40 MG/0.4ML ~~LOC~~ SOLN: 40.0000 mg | SUBCUTANEOUS | 0 refills | Status: AC

## 2019-06-06 NOTE — Progress Notes (Signed)
PROGRESS NOTE    [name]  [id number] DOB: May 05, 1941 DOA: 06/03/2019 PCP: Celene Squibb, MD  Brief Narrative:78 y.o.femalewith medical history significant forhypertension, anxiety, hypothyroidism, history of CVA, dementia, left hip fracture status post operative repair on April 19, 2019, left prosthetic hip dislocation reduced in the ED on 05/28/2019, now returning to the emergency department with recurrent left hip pain. Patient has difficulty contributing to the history due to her clinical condition with dementia, and her daughter (medical power of attorney) assist with history. Patient has been in a knee immobilizer at home, has been essentially bedbound, is frequently restless and pulling at her knee immobilizer in bed and then began to complain of pain again today, localizing this to the left hip. She had not fallen. There were no other complaints. Daughter was concerned for recurrent dislocation and patient was brought into the ED with EMS.  ED Course:Upon arrival to the ED, patient is found to be afebrile, saturating well on room air, hypertensive to 200/100.Radiographs of the left hip demonstrate superior dislocation of the left femoral head prosthesis. Chemistry panel notable for potassium 3.0 and albumin 2.7. CBC with stable hemoglobin at 9.9 but new mild macrocytosis. Chest x-ray with no acute findings. COVID-19 testing is in process. Reduction was attempted with conscious sedation in the emergency department but unsuccessful despite multiple attempts. Orthopedic surgery was consulted and recommended medical admission to [facility] hospital  Assessment & Plan:   Principal Problem:   Closed dislocation of hip, left, initial encounter [facility]) Active Problems:   Rheumatoid arthritis ([place])   Hypokalemia   Hypertension   Dementia ([place])   History of stroke   Anxiety  #1 left prosthetic hip dislocation recurrent- she had hemiarthroplasty 04/19/2019 had dislocation  reduced in 9/23 now admitted with recurrent dislocation.  Attempts to reduce in the ED was unsuccessful.    She was seen by orthopedics and underwent left hip arthroplasty.  She is continued on Lovenox for DVT prophylaxis.  Weightbearing as tolerated.  Daughter reports that during a previous admission, the patient was not approved for skilled nursing facility due to concerns around her cognitive status and ability prior to participate with physical therapy.  Upon returning home at that time, home health physical therapy was arranged and patient did participate well with improvement of her functional status.  Her daughter feels that patient has enough cognitive ability to participate with physical therapy and would benefit from skilled nursing facility.  She was seen by physical therapy who also agrees with skilled nursing facility placement..    #2 history of stroke.  Continue Plavix, continue dysphagia 3 diet   3 hypertension with hypertensive urgency versus due to pain. ACE inhibitor on hold for the surgery continue labetalol as needed.  Blood pressure currently stable  #4 dementia continue Namenda.  She is likely more confused in the hospital due to anesthesia/surgery related delirium versus hospital delirium.  #5 history of rheumatoid arthritis.  Per orthopedics, hold methotrexate for the next 2 weeks  #6 hypokalemia replaced  #7  Anemia, likely related to recent surgery and blood loss.  Patient was transfused 1 unit PRBC on 10/1 for hemoglobin of 6.6 with improvement of hemoglobin to 8.2.  Continue to monitor.   DVT prophylaxis: SCD Code Status: Full code Family Communication: Discussed with daughter 10/2 Disposition Plan:  Nursing facility placement on discharge.  Consultants:   Ortho  Procedures none Antimicrobials: None     Nutrition Problem: Increased nutrient needs Etiology: acute illness  Signs/Symptoms: estimated needs    Interventions: Refer to RD note  for recommendations  Estimated body mass index is 22.62 kg/m as calculated from the following:   Height as of this encounter: 5\' 2"  (1.575 m).   Weight as of this encounter: 56.1 kg.   Subjective:  Daughter reports that she has been thirsty and asking for water.  Still has significant pain in her leg.  No vomiting.  Her daughter feels that she is more confused today.  Objective: Vitals:   06/05/19 2330 06/06/19 0102 06/06/19 0118 06/06/19 0519  BP: (!) 119/49 (!) 115/57  (!) 141/93  Pulse: 86 88  91  Resp: 14 14 15 14   Temp: 97.6 F (36.4 C) (!) 97.3 F (36.3 C)  (!) 97.4 F (36.3 C)  TempSrc: Axillary Oral  Oral  SpO2: 98% 99%  94%  Weight:      Height:        Intake/Output Summary (Last 24 hours) at 06/06/2019 1906 Last data filed at 06/06/2019 1450 Gross per 24 hour  Intake 2823.38 ml  Output 1375 ml  Net 1448.38 ml   Filed Weights   06/03/19 1345 06/04/19 0007  Weight: 60 kg 56.1 kg    Examination:  General exam: Appears calm and comfortable, mucous membranes are dry Respiratory system: Clear to auscultation. Respiratory effort normal. Cardiovascular system: S1 & S2 heard, mildly tachycardic. No JVD, murmurs, rubs, gallops or clicks. No pedal edema. Gastrointestinal system: Abdomen is nondistended, soft and nontender. No organomegaly or masses felt. Normal bowel sounds heard. Central nervous system:  No focal neurological deficits. Extremities: No edema Skin: No rashes, lesions or ulcers Psychiatry: Confused    Data Reviewed: I have personally reviewed following labs and imaging studies  CBC: Recent Labs  Lab 06/03/19 2049 06/04/19 0328 06/05/19 0321 06/05/19 1742 06/06/19 0246  WBC 7.3 10.6* 8.8 18.5* 10.9*  HGB 9.9* 10.6* 9.2* 6.6* 8.2*  HCT 32.0* 33.7* 31.2* 22.1* 26.8*  MCV 101.9* 101.8* 106.8* 106.3* 93.1  PLT 225 190 185 243 [id number]   Basic Metabolic Panel: Recent Labs  Lab 06/03/19 2049 06/04/19 0328 06/05/19 0321 06/06/19 0246  NA 137  139 139 140  K 3.0* 3.6 4.3 4.7  CL 102 105 110 111  CO2 27 26 23  20*  GLUCOSE 112* 122* 108* 166*  BUN 12 13 15 16   CREATININE 0.73 0.64 0.71 0.68  CALCIUM 9.3 9.4 9.0 8.8*   GFR: Estimated Creatinine Clearance: 44.4 mL/min (by C-G formula based on SCr of 0.68 mg/dL). Liver Function Tests: Recent Labs  Lab 06/03/19 2049 06/05/19 0321 06/06/19 0246  AST 30 25 34  ALT 17 14 18   ALKPHOS 92 76 60  BILITOT 1.0 1.1 1.0  PROT 6.1* 5.2* 5.0*  ALBUMIN 2.7* 2.4* 2.8*   No results for input(s): LIPASE, AMYLASE in the last 168 hours. No results for input(s): AMMONIA in the last 168 hours. Coagulation Profile: No results for input(s): INR, PROTIME in the last 168 hours. Cardiac Enzymes: No results for input(s): CKTOTAL, CKMB, CKMBINDEX, TROPONINI in the last 168 hours. BNP (last 3 results) No results for input(s): PROBNP in the last 8760 hours. HbA1C: No results for input(s): HGBA1C in the last 72 hours. CBG: Recent Labs  Lab 06/03/19 2343 06/06/19 0109  GLUCAP 124* 144*   Lipid Profile: No results for input(s): CHOL, HDL, LDLCALC, TRIG, CHOLHDL, LDLDIRECT in the last 72 hours. Thyroid Function Tests: No results for input(s): TSH, T4TOTAL, FREET4, T3FREE, THYROIDAB in the last 72 hours.  Anemia Panel: No results for input(s): VITAMINB12, FOLATE, FERRITIN, TIBC, IRON, RETICCTPCT in the last 72 hours. Sepsis Labs: No results for input(s): PROCALCITON, LATICACIDVEN in the last 168 hours.  Recent Results (from the past 240 hour(s))  SARS Coronavirus 2 [facility] order, Performed in [facility] hospital lab) Nasopharyngeal Nasopharyngeal Swab     Status: None   Collection Time: 06/03/19  7:55 PM   Specimen: Nasopharyngeal Swab  Result Value Ref Range Status   SARS Coronavirus 2 NEGATIVE NEGATIVE Final    Comment: (NOTE) If result is NEGATIVE SARS-CoV-2 target nucleic acids are NOT DETECTED. The SARS-CoV-2 RNA is generally detectable in upper and lower  respiratory specimens  during the acute phase of infection. The lowest  concentration of SARS-CoV-2 viral copies this assay can detect is 250  copies / mL. A negative result does not preclude SARS-CoV-2 infection  and should not be used as the sole basis for treatment or other  patient management decisions.  A negative result may occur with  improper specimen collection / handling, submission of specimen other  than nasopharyngeal swab, presence of viral mutation(s) within the  areas targeted by this assay, and inadequate number of viral copies  (<250 copies / mL). A negative result must be combined with clinical  observations, patient history, and epidemiological information. If result is POSITIVE SARS-CoV-2 target nucleic acids are DETECTED. The SARS-CoV-2 RNA is generally detectable in upper and lower  respiratory specimens dur ing the acute phase of infection.  Positive  results are indicative of active infection with SARS-CoV-2.  Clinical  correlation with patient history and other diagnostic information is  necessary to determine patient infection status.  Positive results do  not rule out bacterial infection or co-infection with other viruses. If result is PRESUMPTIVE POSTIVE SARS-CoV-2 nucleic acids MAY BE PRESENT.   A presumptive positive result was obtained on the submitted specimen  and confirmed on repeat testing.  While 2019 novel coronavirus  (SARS-CoV-2) nucleic acids may be present in the submitted sample  additional confirmatory testing may be necessary for epidemiological  and / or clinical management purposes  to differentiate between  SARS-CoV-2 and other Sarbecovirus currently known to infect humans.  If clinically indicated additional testing with an alternate test  methodology [id number]) is advised. The SARS-CoV-2 RNA is generally  detectable in upper and lower respiratory sp ecimens during the acute  phase of infection. The expected result is Negative. Fact Sheet for Patients:   StrictlyIdeas.no Fact Sheet for Healthcare Providers: BankingDealers.co.za This test is not yet approved or cleared by the [place] FDA and has been authorized for detection and/or diagnosis of SARS-CoV-2 by FDA under an Emergency Use Authorization (EUA).  This EUA will remain in effect (meaning this test can be used) for the duration of the COVID-19 declaration under Section 564(b)(1) of the Act, 21 U.S.C. section 360bbb-3(b)(1), unless the authorization is terminated or revoked sooner. Performed at [facility], [address], [place], [place] [address]   Culture, blood (x 2)     Status: None (Preliminary result)   Collection Time: 06/03/19 11:07 PM   Specimen: BLOOD  Result Value Ref Range Status   Specimen Description BLOOD LEFT ANTECUBITAL  Final   Special Requests   Final    BOTTLES DRAWN AEROBIC AND ANAEROBIC Blood Culture adequate volume   Culture   Final    NO GROWTH 3 DAYS Performed at [facility], [address] [place], [place] [address]    Report Status PENDING  Incomplete  Culture, blood (x 2)     Status: None (Preliminary result)   Collection Time: 06/03/19 11:08 PM   Specimen: BLOOD  Result Value Ref Range Status   Specimen Description BLOOD RIGHT ANTECUBITAL  Final   Special Requests   Final    BOTTLES DRAWN AEROBIC AND ANAEROBIC Blood Culture adequate volume   Culture   Final    NO GROWTH 3 DAYS Performed at [facility], [address], [place], [place] [address]    Report Status PENDING  Incomplete  Surgical PCR screen     Status: Abnormal   Collection Time: 06/04/19  1:33 AM   Specimen: Nasal Mucosa; Nasal Swab  Result Value Ref Range Status   MRSA, PCR POSITIVE (A) NEGATIVE Final    Comment: RESULT CALLED TO, READ BACK BY AND VERIFIED WITH: MCKINNEY, S. @1122  ON 9.30.2020 BY [email or web address]    Staphylococcus aureus POSITIVE (A) NEGATIVE Final    Comment: (NOTE) The Xpert SA Assay (FDA approved for NASAL  specimens in patients 31 years of age and older), is one component of a comprehensive surveillance program. It is not intended to diagnose infection nor to guide or monitor treatment. Performed at [facility], [place] [address], [place], [place] [address]          Radiology Studies: Dg Pelvis Portable  Result Date: 06/05/2019 CLINICAL DATA:  78 year old female status post left hip arthroplasty. EXAM: PORTABLE PELVIS 1-2 VIEWS COMPARISON:  Earlier radiograph dated 06/05/2019 FINDINGS: There is a left hip arthroplasty. The arthroplasty components appear intact and in anatomic alignment. The bones are osteopenic. No acute fracture or dislocation. Soft tissue edema and air and cutaneous clips over the left hip. IMPRESSION: 1. Status post left hip arthroplasty. No acute fracture or dislocation. 2. Osteopenia. Electronically Signed   By: Anner Crete M.D.   On: 06/05/2019 18:19   Dg Pelvis Portable  Result Date: 06/05/2019 CLINICAL DATA:  Posterior total hip revision EXAM: PORTABLE PELVIS 1-2 VIEWS COMPARISON:  Portable exam 1609 hours compared to 05/28/2019 FINDINGS: Acetabular and femoral components of a LEFT hip prosthesis identified. No acute fracture, dislocation, or bone destruction. Osseous demineralization. IMPRESSION: LEFT hip prosthesis without acute complication. Electronically Signed   By: Lavonia Dana M.D.   On: 06/05/2019 16:30        Scheduled Meds: . clopidogrel  75 mg Oral Daily  . docusate sodium  100 mg Oral BID  . DULoxetine  60 mg Oral Daily  . enoxaparin (LOVENOX) injection  40 mg Subcutaneous Q24H  . folic acid  [id number] mcg Oral Daily  . memantine  5 mg Oral BID  . mirabegron ER  25 mg Oral Daily  . mupirocin ointment  1 application Nasal BID  . pantoprazole  40 mg Oral Daily  . cyanocobalamin  1,000 mcg Oral Daily   Continuous Infusions: . lactated ringers 100 mL/hr at 06/06/19 1646  . methocarbamol (ROBAXIN) IV Stopped (06/06/19 [facility]:[id number])      LOS: 3 days     Kathie Dike, MD [facility]    If 7PM-7AM, please contact night-coverage [email or web address] Password [email or web address] 06/06/2019, 7:06 PM

## 2019-06-06 NOTE — Progress Notes (Addendum)
    Subjective:  No events noted. Severe dementia. Hip abduction brace delivered this am.  Objective:   VITALS:   Vitals:   06/05/19 2330 06/06/19 0102 06/06/19 0118 06/06/19 0519  BP: (!) 119/49 (!) 115/57  (!) 141/93  Pulse: 86 88  91  Resp: 14 14 15 14   Temp: 97.6 F (36.4 C) (!) 97.3 F (36.3 C)  (!) 97.4 F (36.3 C)  TempSrc: Axillary Oral  Oral  SpO2: 98% 99%  94%  Weight:      Height:        NAD, confused ABD soft Intact pulses distally Dorsiflexion/Plantar flexion intact Incision: dressing C/D/I Compartment soft unable to assess sensory due to AMS Abduction brace intact.  Lab Results  Component Value Date   WBC 10.9 (H) 06/06/2019   HGB 8.2 (L) 06/06/2019   HCT 26.8 (L) 06/06/2019   MCV 93.1 06/06/2019   PLT 172 06/06/2019   BMET    Component Value Date/Time   NA 140 06/06/2019 0246   K 4.7 06/06/2019 0246   CL 111 06/06/2019 0246   CO2 20 (L) 06/06/2019 0246   GLUCOSE 166 (H) 06/06/2019 0246   BUN 16 06/06/2019 0246   CREATININE 0.68 06/06/2019 0246   CREATININE 0.88 12/28/2017 1540   CALCIUM 8.8 (L) 06/06/2019 0246   GFRNONAA >60 06/06/2019 0246   GFRNONAA 63 12/28/2017 1540   GFRAA >60 06/06/2019 0246   GFRAA 73 12/28/2017 1540     Assessment/Plan: 1 Day Post-Op   Principal Problem:   Closed dislocation of hip, left, initial encounter ([place]) Active Problems:   Rheumatoid arthritis ([place])   Hypokalemia   Hypertension   Dementia ([place])   History of stroke   Anxiety   WBAT with walker Posterior hip precautions Hip abduction brace at all times DVT ppx: Lovenox, SCDs, TEDS PO pain control PT/OT Dispo: D/C planning, hold MTX for at least 2 weeks   Bertram Savin 06/06/2019, 10:52 AM   Rod Can, MD Cell: [phone] [place] [facility] now [facility]  [facility] [address], [place], [place],  [address] Phone: [phone] [email or web address] Facebook  [employer]

## 2019-06-06 NOTE — Evaluation (Signed)
Physical Therapy Evaluation Patient Details Name: [name] MRN: [id number] DOB: 1940-11-02 Today's Date: 06/06/2019   History of Present Illness  [name]  is an 78 y.o. female s/p L THR as conversion from L hemi-arthroplasty with multiple dislocations and with hx of gerd, hypothyroidism, RA,  hypertension, h/o CVA, dementia,  h/o breast cancer, neuropathy.  Clinical Impression  Pt admitted as above and presenting with functional mobility limitations 2* decreased L LE strength/ROM, generalized weakness and premorbid deconditioning, balance deficits and dementia related cognitive deficits.  Pt would benefit from follow up rehab at SNF level to maximize IND and safety.    Follow Up Recommendations SNF    Equipment Recommendations  None recommended by PT    Recommendations for Other Services       Precautions / Restrictions Precautions Precautions: Fall Required Braces or Orthoses: Other Brace Other Brace: L hip abductor brace Restrictions Weight Bearing Restrictions: No LLE Weight Bearing: Weight bearing as tolerated      Mobility  Bed Mobility Overal bed mobility: Needs Assistance Bed Mobility: Supine to Sit;Sit to Supine     Supine to sit: +2 for physical assistance;+2 for safety/equipment;Max assist;Total assist Sit to supine: Max assist;Total assist;+2 for physical assistance;+2 for safety/equipment   General bed mobility comments: Pt not following cues - assisted to/from EOB sitting with use of pad  Transfers                 General transfer comment: NT 2* pt leaning posteriorly in sitting and not following cues for participation  Ambulation/Gait                Stairs            Wheelchair Mobility    Modified Rankin (Stroke Patients Only)       Balance Overall balance assessment: Needs assistance Sitting-balance support: Feet supported;Bilateral upper extremity supported Sitting balance-Leahy Scale: Poor Sitting balance -  Comments: posterior lean exacerbated by abductor splint                                     Pertinent Vitals/Pain Pain Assessment: Faces Faces Pain Scale: Hurts a little bit Pain Location: L hip with movement Pain Descriptors / Indicators: Grimacing Pain Intervention(s): Limited activity within patient's tolerance;Monitored during session    Home Living Family/patient expects to be discharged to:: Unsure                      Prior Function Level of Independence: Needs assistance         Comments: Pt unable to provide hx     Hand Dominance        Extremity/Trunk Assessment   Upper Extremity Assessment Upper Extremity Assessment: Generalized weakness    Lower Extremity Assessment Lower Extremity Assessment: Generalized weakness;LLE deficits/detail LLE Deficits / Details: abd splint in place    Cervical / Trunk Assessment Cervical / Trunk Assessment: Kyphotic  Communication   Communication: Receptive difficulties;Expressive difficulties  Cognition Arousal/Alertness: Awake/alert Behavior During Therapy: Impulsive Overall Cognitive Status: History of cognitive impairments - at baseline                                 General Comments: significant dementia with speaking in rambling manner and not addressing questions appropriately      General Comments  Exercises     Assessment/Plan    PT Assessment Patient needs continued PT services  PT Problem List Decreased strength;Decreased range of motion;Decreased activity tolerance;Decreased balance;Decreased mobility;Decreased cognition;Decreased knowledge of use of DME;Pain       PT Treatment Interventions DME instruction;Gait training;Functional mobility training;Therapeutic activities;Therapeutic exercise;Balance training;Patient/family education    PT Goals (Current goals can be found in the Care Plan section)  Acute Rehab PT Goals Patient Stated Goal: No goals  stated PT Goal Formulation: Patient unable to participate in goal setting Time For Goal Achievement: 06/13/19 Potential to Achieve Goals: Poor    Frequency Min 3X/week   Barriers to discharge        Co-evaluation               AM-PAC PT "6 Clicks" Mobility  Outcome Measure Help needed turning from your back to your side while in a flat bed without using bedrails?: Total Help needed moving from lying on your back to sitting on the side of a flat bed without using bedrails?: Total Help needed moving to and from a bed to a chair (including a wheelchair)?: Total Help needed standing up from a chair using your arms (e.g., wheelchair or bedside chair)?: Total Help needed to walk in hospital room?: Total Help needed climbing 3-5 steps with a railing? : Total 6 Click Score: 6    End of Session Equipment Utilized During Treatment: Gait belt Activity Tolerance: Patient limited by fatigue Patient left: in bed;with call bell/phone within reach;with bed alarm set Nurse Communication: Mobility status PT Visit Diagnosis: Muscle weakness (generalized) (M62.81);Difficulty in walking, not elsewhere classified (R26.2)    Time: [id number] PT Time Calculation (min) (ACUTE ONLY): 17 min   Charges:   PT Evaluation $PT Eval Low Complexity: 1 Low          [place] [facility] Pager [phone] Office [phone]   [name] 06/06/2019, 2:05 PM

## 2019-06-06 NOTE — Care Management Important Message (Signed)
Important Message  Patient Details IM Letter given to Kathrin Greathouse SW to present to the Patient Name: [name] MRN: [id number] Date of Birth: [birth year]   Medicare Important Message Given:  Yes     Kerin Salen 06/06/2019, 12:27 PM

## 2019-06-07 LAB — CBC
HCT: 23.2 % — ABNORMAL LOW (ref 36.0–46.0)
Hemoglobin: 7.1 g/dL — ABNORMAL LOW (ref 12.0–15.0)
MCH: 29.1 pg (ref 26.0–34.0)
MCHC: 30.6 g/dL (ref 30.0–36.0)
MCV: 95.1 fL (ref 80.0–100.0)
Platelets: 225 10*3/uL (ref 150–400)
RBC: 2.44 MIL/uL — ABNORMAL LOW (ref 3.87–5.11)
RDW: 22.9 % — ABNORMAL HIGH (ref 11.5–15.5)
WBC: 14.6 10*3/uL — ABNORMAL HIGH (ref 4.0–10.5)
nRBC: 0.4 % — ABNORMAL HIGH (ref 0.0–0.2)

## 2019-06-07 LAB — BASIC METABOLIC PANEL
Anion gap: 5 (ref 5–15)
BUN: 16 mg/dL (ref 8–23)
CO2: 23 mmol/L (ref 22–32)
Calcium: 8.7 mg/dL — ABNORMAL LOW (ref 8.9–10.3)
Chloride: 109 mmol/L (ref 98–111)
Creatinine, Ser: 0.59 mg/dL (ref 0.44–1.00)
GFR calc Af Amer: >60 mL/min (ref >60)
GFR calc non Af Amer: >60 mL/min (ref >60)
Glucose, Bld: 130 mg/dL — ABNORMAL HIGH (ref 70–99)
Potassium: 4 mmol/L (ref 3.5–5.1)
Sodium: 137 mmol/L (ref 135–145)

## 2019-06-07 LAB — PREPARE RBC (CROSSMATCH)

## 2019-06-07 MED ORDER — ACETAMINOPHEN 650 MG RE SUPP: 650.0000 mg | Freq: Four times a day (QID) | RECTAL | Status: DC | PRN

## 2019-06-07 MED ORDER — ACETAMINOPHEN 325 MG PO TABS
650.0000 mg | ORAL_TABLET | Freq: Four times a day (QID) | ORAL | Status: DC | PRN
  Administered 2019-06-09: 650 mg via ORAL
  Filled 2019-06-07: qty 2

## 2019-06-07 MED ORDER — ACETAMINOPHEN 160 MG/5ML PO SOLN: 650.0000 mg | Freq: Four times a day (QID) | ORAL | Status: DC | PRN

## 2019-06-07 MED ORDER — SODIUM CHLORIDE 0.9% IV SOLUTION
Freq: Once | INTRAVENOUS | Status: AC
  Administered 2019-06-07: 12:00:00 via INTRAVENOUS

## 2019-06-07 MED ORDER — ACETAMINOPHEN 325 MG PO TABS: 650.0000 mg | ORAL_TABLET | Freq: Four times a day (QID) | ORAL | Status: DC | PRN

## 2019-06-07 MED ORDER — SODIUM CHLORIDE 0.9% IV SOLUTION
Freq: Once | INTRAVENOUS | Status: AC
  Administered 2019-06-07: 22:00:00 via INTRAVENOUS

## 2019-06-07 NOTE — Progress Notes (Signed)
    Subjective:  No events noted. Severe dementia.  Objective:   VITALS:   Vitals:   06/06/19 2233 06/07/19 0133 06/07/19 0136 06/07/19 0520  BP:    133/68  Pulse:  (!) 103 100 99  Resp:  16 16 16   Temp:    97.6 F (36.4 C)  TempSrc:    Oral  SpO2: 93% (!) 85% 95% 93%  Weight:      Height:        NAD, confused ABD soft Intact pulses distally Dorsiflexion/Plantar flexion intact Incision: dressing C/D/I Compartment soft unable to assess sensory due to AMS Abduction brace intact.  Lab Results  Component Value Date   WBC 14.6 (H) 06/07/2019   HGB 7.1 (L) 06/07/2019   HCT 23.2 (L) 06/07/2019   MCV 95.1 06/07/2019   PLT 225 06/07/2019   BMET    Component Value Date/Time   NA 137 06/07/2019 0305   K 4.0 06/07/2019 0305   CL 109 06/07/2019 0305   CO2 23 06/07/2019 0305   GLUCOSE 130 (H) 06/07/2019 0305   BUN 16 06/07/2019 0305   CREATININE 0.59 06/07/2019 0305   CREATININE 0.88 12/28/2017 1540   CALCIUM 8.7 (L) 06/07/2019 0305   GFRNONAA >60 06/07/2019 0305   GFRNONAA 63 12/28/2017 1540   GFRAA >60 06/07/2019 0305   GFRAA 73 12/28/2017 1540     Assessment/Plan: 2 Days Post-Op   Principal Problem:   Closed dislocation of hip, left, initial encounter ([place]) Active Problems:   Rheumatoid arthritis ([place])   Hypokalemia   Hypertension   Dementia (HCC)   History of stroke   Anxiety   WBAT with walker Posterior hip precautions Hip abduction brace at all times DVT ppx: Lovenox, SCDs, TEDS PO pain control PT/OT Dispo: D/C planning, hold MTX for at least 2 weeks   [name] 06/07/2019, 10:03 AM   Rod Can, MD Cell: [phone] [place] is now [facility]  [place] [address], [place], [place] [address] Phone: [phone] [email or web address] Facebook  [employer]

## 2019-06-07 NOTE — Progress Notes (Signed)
PROGRESS NOTE    [name]  [id number] DOB: 11/17/1940 DOA: 06/03/2019 PCP: Samantha Squibb, MD      Brief Narrative:  [name] is a 78 y.o. F with advanced dementia, home-dwelling, hx CVA without residual weakness, hx remote BrCA, HTN, RA on methotrexate and recent hip fracture repair who presented with recurrent left hip pain.  Patient had had left posterior hemiarthroplasty for hip fracture about 6 weeks ago.  Had a dislocation of the hip 2 weeks ago, reduced in the ER, has been in bed, with knee immobilizer since, but on day of admission, complained of pain.  In the ER, radiography showed recurrent superior dislocation of the femoral head prosthesis.  Reduction with conscious sedation failed in the ER.  Orthopedics were consulted, who recommended conversion to total hip arthroplasty and the patient was admitted.         Assessment & Plan:  Closed LEFT hip fracture, recurrent dislocation of prosthesis S/p total hip arthroplasty 10/2 -Continue Lovenox for DVT ppx -WBAT -PT eval   Cerebrovascular disease secondary prevention Hypertension BP normal -Hold lisinopril -Continue Plavix -Continue dysphagia 3 diet  Dementia Mentation is confused and babbling, unclear if this is baseline.    -Continue Namenda Delirium precautions:   -Lights and TV off, minimize interruptions at night  -Blinds open and lights on during day  -Glasses/hearing aid with patient  -Frequent reorientation  -PT/OT when able  -Avoid sedation medications/Beers list medications -Continue Duloxetine   Rheumatoid arthritis -Hold methotrexate until 10/16   Other medications -Continue mirabegron -Continue PPI  Acute blood loss anemia Expected post-op blood loss. Transfused 10/1.  Hgb down to 7.1 g/dL today, patient appears pale, weak. -Transfuse x1 today       MDM and disposition: The below labs and imaging reports were reviewed and summarized above.  Medication management as  above.  The patient was admitted with recurrent left hip dislocation.  She has now undergone revision with total hip arthroplasty and is in a hip abduction brace.  The patient presents with new functional deficits (unable to walk).  Given that this hip has twice been dislocated at home, and now requires specialized bracing to prevent re-dislocation, and will require substantially more nursing care assistance with ADLs than her baseline until her hip stabilizes and has healed enough that she is able to walk again, skilled nursing rehabilitation is warranted and recommended.      DVT prophylaxis: Lovenox Code Status: FULL Family Communication:     Consultants:   Orthopedics  Procedures:   10/1 Total hip arthroplasty  10/1 transfused 1u PRBCs      Subjective: No fever, no respiratory distress. Patient is mostly unable to intelligibly articulate concerns, as is her baseline.  No vomiting, diarrhea.  Objective: Vitals:   06/06/19 2233 06/07/19 0133 06/07/19 0136 06/07/19 0520  BP:    133/68  Pulse:  (!) 103 100 99  Resp:  _0 Temp:    97.6 F (36.4 C)  TempSrc:    Oral  SpO2: 93% (!) 85% 95% 93%  Weight:      Height:        Intake/Output Summary (Last 24 hours) at 06/07/2019 1111 Last data filed at 06/07/2019 0900 Gross per 24 hour  Intake 2045.35 ml  Output 1010 ml  Net 1035.35 ml   Filed Weights   06/03/19 1345 06/04/19 0007  Weight: 60 kg 56.1 kg    Examination: General appearance: elderly adult female, lying in bed, awake,  no obvious distress.   HEENT: Anicteric, conjunctiva pink, lids and lashes normal. No nasal deformity, discharge, epistaxis.  Lips moist, dentition normal, OP tacky dry, no oral lesions, hearing seems normal.   Skin: Warm and dry.  Pale.  No jaundice.  No suspicious rashes or lesions. Cardiac: RRR, nl S1-S2, no murmurs appreciated.  Capillary refill is brisk.  JVP not visible.  No LE edema.  Radial pulses 2+ and  symmetric. Respiratory: Normal respiratory rate and rhythm.  CTAB without rales or wheezes. Abdomen: Abdomen soft.  No TTP or gaurding. No ascites, distension, hepatosplenomegaly.   MSK:  the pelvis is in a hip abduction splint.  She has diffuse subcutaneous muscle mass loss, probably appropraite for age Neuro: Awake and makes eye contact.  EOMI grossly, moves upper extremities with normal strength and coordination. Speech fluent.    Psych: Sensorium intact and responding to questions, but responses mostly babbling, incoherent.  Attention seems diminished.  Judgment and insight appear impaired by dementia.    Data Reviewed: I have personally reviewed following labs and imaging studies:  CBC: Recent Labs  Lab 06/04/19 0328 06/05/19 0321 06/05/19 1742 06/06/19 0246 06/07/19 0305  WBC 10.6* 8.8 18.5* 10.9* 14.6*  HGB 10.6* 9.2* 6.6* 8.2* 7.1*  HCT 33.7* 31.2* 22.1* 26.8* 23.2*  MCV 101.8* 106.8* 106.3* 93.1 95.1  PLT 190 185 243 172 882   Basic Metabolic Panel: Recent Labs  Lab 06/03/19 2049 06/04/19 0328 06/05/19 0321 06/06/19 0246 06/07/19 0305  NA 137 139 139 140 137  K 3.0* 3.6 4.3 4.7 4.0  CL 102 105 110 111 109  CO2 _0 20* 23  GLUCOSE 112* 122* 108* 166* 130*  BUN _1 CREATININE 0.73 0.64 0.71 0.68 0.59  CALCIUM 9.3 9.4 9.0 8.8* 8.7*   GFR: Estimated Creatinine Clearance: 44.4 mL/min (by C-G formula based on SCr of 0.59 mg/dL). Liver Function Tests: Recent Labs  Lab 06/03/19 2049 06/05/19 0321 06/06/19 0246  AST 30 25 34  ALT _2 ALKPHOS 92 76 60  BILITOT 1.0 1.1 1.0  PROT 6.1* 5.2* 5.0*  ALBUMIN 2.7* 2.4* 2.8*   No results for input(s): LIPASE, AMYLASE in the last 168 hours. No results for input(s): AMMONIA in the last 168 hours. Coagulation Profile: No results for input(s): INR, PROTIME in the last 168 hours. Cardiac Enzymes: No results for input(s): CKTOTAL, CKMB, CKMBINDEX, TROPONINI in the last 168 hours. BNP (last 3  results) No results for input(s): PROBNP in the last 8760 hours. HbA1C: No results for input(s): HGBA1C in the last 72 hours. CBG: Recent Labs  Lab 06/03/19 2343 06/06/19 0109  GLUCAP 124* 144*   Lipid Profile: No results for input(s): CHOL, HDL, LDLCALC, TRIG, CHOLHDL, LDLDIRECT in the last 72 hours. Thyroid Function Tests: No results for input(s): TSH, T4TOTAL, FREET4, T3FREE, THYROIDAB in the last 72 hours. Anemia Panel: No results for input(s): VITAMINB12, FOLATE, FERRITIN, TIBC, IRON, RETICCTPCT in the last 72 hours. Urine analysis:    Component Value Date/Time   COLORURINE YELLOW 06/03/2019 2257   APPEARANCEUR CLOUDY (A) 06/03/2019 2257   LABSPEC 1.013 06/03/2019 2257   PHURINE 7.0 06/03/2019 2257   GLUCOSEU NEGATIVE 06/03/2019 2257   HGBUR NEGATIVE 06/03/2019 2257   BILIRUBINUR NEGATIVE 06/03/2019 2257   KETONESUR NEGATIVE 06/03/2019 2257   PROTEINUR NEGATIVE 06/03/2019 2257   NITRITE NEGATIVE 06/03/2019 2257   LEUKOCYTESUR TRACE (A) 06/03/2019 2257   Sepsis Labs: _3 (procalcitonin:4,lacticacidven:4)  ) Recent Results (from the  past 240 hour(s))  SARS Coronavirus 2 [facility] order, Performed in [facility] hospital lab) Nasopharyngeal Nasopharyngeal Swab     Status: None   Collection Time: 06/03/19  7:55 PM   Specimen: Nasopharyngeal Swab  Result Value Ref Range Status   SARS Coronavirus 2 NEGATIVE NEGATIVE Final    Comment: (NOTE) If result is NEGATIVE SARS-CoV-2 target nucleic acids are NOT DETECTED. The SARS-CoV-2 RNA is generally detectable in upper and lower  respiratory specimens during the acute phase of infection. The lowest  concentration of SARS-CoV-2 viral copies this assay can detect is 250  copies / mL. A negative result does not preclude SARS-CoV-2 infection  and should not be used as the sole basis for treatment or other  patient management decisions.  A negative result may occur with  improper specimen collection / handling, submission  of specimen other  than nasopharyngeal swab, presence of viral mutation(s) within the  areas targeted by this assay, and inadequate number of viral copies  (<250 copies / mL). A negative result must be combined with clinical  observations, patient history, and epidemiological information. If result is POSITIVE SARS-CoV-2 target nucleic acids are DETECTED. The SARS-CoV-2 RNA is generally detectable in upper and lower  respiratory specimens dur ing the acute phase of infection.  Positive  results are indicative of active infection with SARS-CoV-2.  Clinical  correlation with patient history and other diagnostic information is  necessary to determine patient infection status.  Positive results do  not rule out bacterial infection or co-infection with other viruses. If result is PRESUMPTIVE POSTIVE SARS-CoV-2 nucleic acids MAY BE PRESENT.   A presumptive positive result was obtained on the submitted specimen  and confirmed on repeat testing.  While 2019 novel coronavirus  (SARS-CoV-2) nucleic acids may be present in the submitted sample  additional confirmatory testing may be necessary for epidemiological  and / or clinical management purposes  to differentiate between  SARS-CoV-2 and other Sarbecovirus currently known to infect humans.  If clinically indicated additional testing with an alternate test  methodology [id number]) is advised. The SARS-CoV-2 RNA is generally  detectable in upper and lower respiratory sp ecimens during the acute  phase of infection. The expected result is Negative. Fact Sheet for Patients:  StrictlyIdeas.no Fact Sheet for Healthcare Providers: BankingDealers.co.za This test is not yet approved or cleared by the [place] FDA and has been authorized for detection and/or diagnosis of SARS-CoV-2 by FDA under an Emergency Use Authorization (EUA).  This EUA will remain in effect (meaning this test can be used) for  the duration of the COVID-19 declaration under Section 564(b)(1) of the Act, 21 U.S.C. section 360bbb-3(b)(1), unless the authorization is terminated or revoked sooner. Performed at [facility], [address], [place], [place] [address]   Culture, blood (x 2)     Status: None (Preliminary result)   Collection Time: 06/03/19 11:07 PM   Specimen: BLOOD  Result Value Ref Range Status   Specimen Description BLOOD LEFT ANTECUBITAL  Final   Special Requests   Final    BOTTLES DRAWN AEROBIC AND ANAEROBIC Blood Culture adequate volume   Culture   Final    NO GROWTH 4 DAYS Performed at [facility], [address] [place], [place] [address]    Report Status PENDING  Incomplete  Culture, blood (x 2)     Status: None (Preliminary result)   Collection Time: 06/03/19 11:08 PM   Specimen: BLOOD  Result Value Ref Range Status   Specimen Description BLOOD  RIGHT ANTECUBITAL  Final   Special Requests   Final    BOTTLES DRAWN AEROBIC AND ANAEROBIC Blood Culture adequate volume   Culture   Final    NO GROWTH 4 DAYS Performed at [facility], [address], [place], [place] [address]    Report Status PENDING  Incomplete  Surgical PCR screen     Status: Abnormal   Collection Time: 06/04/19  1:33 AM   Specimen: Nasal Mucosa; Nasal Swab  Result Value Ref Range Status   MRSA, PCR POSITIVE (A) NEGATIVE Final    Comment: RESULT CALLED TO, READ BACK BY AND VERIFIED WITH: MCKINNEY, S. _0  ON 9.30.2020 BY [email or web address]    Staphylococcus aureus POSITIVE (A) NEGATIVE Final    Comment: (NOTE) The Xpert SA Assay (FDA approved for NASAL specimens in patients 32 years of age and older), is one component of a comprehensive surveillance program. It is not intended to diagnose infection nor to guide or monitor treatment. Performed at [facility], [place] [address] [place], [place] [address]          Radiology Studies: Dg Pelvis Portable  Result Date: 06/05/2019 CLINICAL DATA:   78 year old female status post left hip arthroplasty. EXAM: PORTABLE PELVIS 1-2 VIEWS COMPARISON:  Earlier radiograph dated 06/05/2019 FINDINGS: There is a left hip arthroplasty. The arthroplasty components appear intact and in anatomic alignment. The bones are osteopenic. No acute fracture or dislocation. Soft tissue edema and air and cutaneous clips over the left hip. IMPRESSION: 1. Status post left hip arthroplasty. No acute fracture or dislocation. 2. Osteopenia. Electronically Signed   By: Anner Crete M.D.   On: 06/05/2019 18:19   Dg Pelvis Portable  Result Date: 06/05/2019 CLINICAL DATA:  Posterior total hip revision EXAM: PORTABLE PELVIS 1-2 VIEWS COMPARISON:  Portable exam 1609 hours compared to 05/28/2019 FINDINGS: Acetabular and femoral components of a LEFT hip prosthesis identified. No acute fracture, dislocation, or bone destruction. Osseous demineralization. IMPRESSION: LEFT hip prosthesis without acute complication. Electronically Signed   By: Lavonia Dana M.D.   On: 06/05/2019 16:30        Scheduled Meds:  sodium chloride   Intravenous Once   clopidogrel  75 mg Oral Daily   docusate sodium  100 mg Oral BID   DULoxetine  60 mg Oral Daily   enoxaparin (LOVENOX) injection  40 mg Subcutaneous P25Z   folic acid  648 mcg Oral Daily   memantine  5 mg Oral BID   mirabegron ER  25 mg Oral Daily   mupirocin ointment  1 application Nasal BID   pantoprazole  40 mg Oral Daily   cyanocobalamin  1,000 mcg Oral Daily   Continuous Infusions:  methocarbamol (ROBAXIN) IV Stopped (06/06/19 0647)     LOS: 4 days    Time spent: 25 minutes    Edwin Dada, MD [facility] 06/07/2019, 11:11 AM     Please page through [place]:  [email or web address] If 7PM-7AM, please contact night-coverage

## 2019-06-08 LAB — CULTURE, BLOOD (ROUTINE X 2)
Culture: NO GROWTH
Culture: NO GROWTH
Special Requests: ADEQUATE
Special Requests: ADEQUATE

## 2019-06-08 LAB — TYPE AND SCREEN
ABO/RH(D): O POS
Antibody Screen: NEGATIVE
Unit division: 0
Unit division: 0

## 2019-06-08 LAB — COMPREHENSIVE METABOLIC PANEL
ALT: 18 U/L (ref 0–44)
AST: 40 U/L (ref 15–41)
Albumin: 2.5 g/dL — ABNORMAL LOW (ref 3.5–5.0)
Alkaline Phosphatase: 75 U/L (ref 38–126)
Anion gap: 6 (ref 5–15)
BUN: 13 mg/dL (ref 8–23)
CO2: 24 mmol/L (ref 22–32)
Calcium: 8.5 mg/dL — ABNORMAL LOW (ref 8.9–10.3)
Chloride: 106 mmol/L (ref 98–111)
Creatinine, Ser: 0.63 mg/dL (ref 0.44–1.00)
GFR calc Af Amer: >60 mL/min (ref >60)
GFR calc non Af Amer: >60 mL/min (ref >60)
Glucose, Bld: 109 mg/dL — ABNORMAL HIGH (ref 70–99)
Potassium: 3.2 mmol/L — ABNORMAL LOW (ref 3.5–5.1)
Sodium: 136 mmol/L (ref 135–145)
Total Bilirubin: 1.4 mg/dL — ABNORMAL HIGH (ref 0.3–1.2)
Total Protein: 5.1 g/dL — ABNORMAL LOW (ref 6.5–8.1)

## 2019-06-08 LAB — CBC
HCT: 30.4 % — ABNORMAL LOW (ref 36.0–46.0)
Hemoglobin: 9.6 g/dL — ABNORMAL LOW (ref 12.0–15.0)
MCH: 29.8 pg (ref 26.0–34.0)
MCHC: 31.6 g/dL (ref 30.0–36.0)
MCV: 94.4 fL (ref 80.0–100.0)
Platelets: 176 10*3/uL (ref 150–400)
RBC: 3.22 MIL/uL — ABNORMAL LOW (ref 3.87–5.11)
RDW: 19.9 % — ABNORMAL HIGH (ref 11.5–15.5)
WBC: 12 10*3/uL — ABNORMAL HIGH (ref 4.0–10.5)
nRBC: 1 % — ABNORMAL HIGH (ref 0.0–0.2)

## 2019-06-08 LAB — BPAM RBC
Blood Product Expiration Date: 202010262359
Blood Product Expiration Date: 202011032359
ISSUE DATE / TIME: 202010011830
ISSUE DATE / TIME: 202010032211
Unit Type and Rh: 5100
Unit Type and Rh: 5100

## 2019-06-08 NOTE — TOC Progression Note (Signed)
Transition of Care [facility]) - Progression Note    Patient Details  Name: [name] MRN: [id number] Date of Birth: 03/03/1941  Transition of Care [facility]) CM/SW Contact  Servando Snare, [place] Phone Number: 06/08/2019, 10:24 AM  Clinical Narrative:   LCSW attempted to call patients son and daughter without success. Unable to leave voicemail for patients children. Patient only oriented to self.   LCSW will continue to follow for disposition.          Expected Discharge Plan and Services                                                 Social Determinants of Health (SDOH) Interventions    Readmission Risk Interventions No flowsheet data found.

## 2019-06-08 NOTE — Progress Notes (Signed)
    Subjective:  No events noted. Severe dementia.  Sleeping   Objective:   VITALS:   Vitals:   06/07/19 2232 06/07/19 2240 06/08/19 0122 06/08/19 0610  BP: 135/62 135/62 135/88 140/83  Pulse: 96 96 98 95  Resp: 16 16 16 20   Temp: 98.3 F (36.8 C) 98.3 F (36.8 C) 98.6 F (37 C) 99.5 F (37.5 C)  TempSrc:  Oral Oral   SpO2:  95% 96% 95%  Weight:      Height:        NAD, confused ABD soft Intact pulses distally Dorsiflexion/Plantar flexion intact Incision: dressing C/D/I Compartment soft unable to assess sensory due to AMS Abduction brace intact.  Lab Results  Component Value Date   WBC 12.0 (H) 06/08/2019   HGB 9.6 (L) 06/08/2019   HCT 30.4 (L) 06/08/2019   MCV 94.4 06/08/2019   PLT 176 06/08/2019   BMET    Component Value Date/Time   NA 136 06/08/2019 0239   K 3.2 (L) 06/08/2019 0239   CL 106 06/08/2019 0239   CO2 24 06/08/2019 0239   GLUCOSE 109 (H) 06/08/2019 0239   BUN 13 06/08/2019 0239   CREATININE 0.63 06/08/2019 0239   CREATININE 0.88 12/28/2017 1540   CALCIUM 8.5 (L) 06/08/2019 0239   GFRNONAA >60 06/08/2019 0239   GFRNONAA 63 12/28/2017 1540   GFRAA >60 06/08/2019 0239   GFRAA 73 12/28/2017 1540     Assessment/Plan: 3 Days Post-Op   Principal Problem:   Closed dislocation of hip, left, initial encounter ([place]) Active Problems:   Rheumatoid arthritis (HCC)   Hypokalemia   Hypertension   Dementia (HCC)   History of stroke   Anxiety   WBAT with walker Posterior hip precautions Hip abduction brace at all times DVT ppx: Lovenox, SCDs, TEDS PO pain control PT/OT Dispo: D/C planning, hold MTX for at least 2 weeks   Nicholes Stairs 06/08/2019, 9:02 AM    [place] is now [employer] [address] [place], [place], [place] [address] Phone: [phone] [email or web address] Facebook  [employer]

## 2019-06-08 NOTE — Progress Notes (Signed)
PROGRESS NOTE    [name]  YKD:[id number] DOB: [birth year] DOA: 06/03/2019 PCP: Celene Squibb, MD      Brief Narrative:  [name] is a 78 y.o. F with advanced dementia, home-dwelling, hx CVA without residual weakness, hx remote BrCA, HTN, RA on methotrexate and recent hip fracture repair who presented with recurrent left hip pain.  Patient had had left posterior hemiarthroplasty for hip fracture about 6 weeks ago.  Had a dislocation of the hip 2 weeks ago, reduced in the ER, has been in bed, with knee immobilizer since, but on day of admission, complained of pain.  In the ER, radiography showed recurrent superior dislocation of the femoral head prosthesis.  Reduction with conscious sedation failed in the ER.  Orthopedics were consulted, who recommended conversion to total hip arthroplasty and the patient was admitted.         Assessment & Plan:  Closed LEFT hip fracture, recurrent dislocation of prosthesis S/p total hip arthroplasty 10/2 Afebrile  -Continue Lovenox for DVT ppx -Orthopedics recommend WBAT with walker -Hip abduction brace at all times -PT eval   Cerebrovascular disease secondary prevention Hypertension BP controlled -Hold lisinopril -Continue Plavix -Continue dysphagia 3 diet  Dementia Mentation is confused and babbling, appears to be near baseline.  Able to follow commands. -Continue Namenda -Continue duloxetine Delirium precautions:   -Lights and TV off, minimize interruptions at night  -Blinds open and lights on during day  -Glasses/hearing aid with patient  -Frequent reorientation  -PT/OT when able  -Avoid sedation medications/Beers list medications   Rheumatoid arthritis No active disease -Hold methotrexate until 10/16   Other medications -Continue mirabegron -Continue PPI  Acute blood loss anemia Expected post-op blood loss. Transfused 10/1 and 10/3.  Hgb increased appropriately post-transfusion        MDM and  disposition: The below labs and imaging reports reviewed and summarized above.  Medication management as above.     The patient was admitted with recurrent left hip dislocation.  She has now undergone revision with total hip arthroplasty and is in a hip abduction brace.  The patient presents with new functional deficits (unable to walk, at baseline she is ambulatory, participates in self-cares, which she is not able to do at present).    Given that this hip has twice been dislocated at home, and now requires specialized bracing to prevent re-dislocation, and will require substantially more nursing care assistance with ADLs than her baseline until her hip stabilizes and has healed enough that she is able to walk again, skilled nursing rehabilitation is warranted and recommended.      DVT prophylaxis: Lovenox Code Status: FULL Family Communication: Attempted call to daughter, no answer    Consultants:   Orthopedics  Procedures:   10/1 Total hip arthroplasty  10/1 transfused 1u PRBCs      Subjective: No new fever, respiratory distress, agitation, vomiting, diarrhea.   Patient's dementia precludes review of systems.      Objective: Vitals:   06/07/19 2240 06/08/19 0122 06/08/19 0610 06/08/19 1056  BP: 135/62 135/88 140/83 130/73  Pulse: 96 98 95 87  Resp: _0 Temp: 98.3 F (36.8 C) 98.6 F (37 C) 99.5 F (37.5 C) (!) 97.5 F (36.4 C)  TempSrc: Oral Oral  Oral  SpO2: 95% 96% 95% 93%  Weight:      Height:        Intake/Output Summary (Last 24 hours) at 06/08/2019 1321 Last data filed at 06/08/2019 0830 Gross  per 24 hour  Intake 1664.93 ml  Output 550 ml  Net 1114.93 ml   Filed Weights   06/03/19 1345 06/04/19 0007  Weight: 60 kg 56.1 kg    Examination: General appearance: Elderly female, lying in bed, no acute distress.  HEENT: Anicteric, conjunctiva pink, lids and lashes normal. No nasal deformity, discharge, epistaxis.  Lips moist, dentition  normal, OP tacky dry, no oral lesions, hearing seems normal.   Skin: Warm and dry.  Pale.  No jaundice.  No suspicious rashes or lesions. Cardiac: RRR, no murmurs, no lower extremity edema Respiratory: Respiratory effort easy and unlabored while sleeping, lungs clear without rales or wheezes in anterior lung fields. Abdomen: Abdomen soft without tenderness to palpation or guarding.  No ascites, distention. MSK: Still in a hip abduction splint.  She has diffuse subcutaneous muscle mass loss, probably appropraite for age Neuro: Sleeping but easily arousable, makes no intelligible responses, follows simple commands, appears hard of hearing. Psych: Unable to assess due to dementia, attention diminished.    Data Reviewed: I have personally reviewed following labs and imaging studies:  CBC: Recent Labs  Lab 06/05/19 0321 06/05/19 1742 06/06/19 0246 06/07/19 0305 06/08/19 0239  WBC 8.8 18.5* 10.9* 14.6* 12.0*  HGB 9.2* 6.6* 8.2* 7.1* 9.6*  HCT 31.2* 22.1* 26.8* 23.2* 30.4*  MCV 106.8* 106.3* 93.1 95.1 94.4  PLT 185 243 172 225 341   Basic Metabolic Panel: Recent Labs  Lab 06/04/19 0328 06/05/19 0321 06/06/19 0246 06/07/19 0305 06/08/19 0239  NA 139 139 140 137 136  K 3.6 4.3 4.7 4.0 3.2*  CL 105 110 111 109 106  CO2 26 23 20* 23 24  GLUCOSE 122* 108* 166* 130* 109*  BUN _0 CREATININE 0.64 0.71 0.68 0.59 0.63  CALCIUM 9.4 9.0 8.8* 8.7* 8.5*   GFR: Estimated Creatinine Clearance: 44.4 mL/min (by C-G formula based on SCr of 0.63 mg/dL). Liver Function Tests: Recent Labs  Lab 06/03/19 2049 06/05/19 0321 06/06/19 0246 06/08/19 0239  AST 30 25 34 40  ALT _1 ALKPHOS 92 76 60 75  BILITOT 1.0 1.1 1.0 1.4*  PROT 6.1* 5.2* 5.0* 5.1*  ALBUMIN 2.7* 2.4* 2.8* 2.5*   No results for input(s): LIPASE, AMYLASE in the last 168 hours. No results for input(s): AMMONIA in the last 168 hours. Coagulation Profile: No results for input(s): INR, PROTIME in the  last 168 hours. Cardiac Enzymes: No results for input(s): CKTOTAL, CKMB, CKMBINDEX, TROPONINI in the last 168 hours. BNP (last 3 results) No results for input(s): PROBNP in the last 8760 hours. HbA1C: No results for input(s): HGBA1C in the last 72 hours. CBG: Recent Labs  Lab 06/03/19 2343 06/06/19 0109  GLUCAP 124* 144*   Lipid Profile: No results for input(s): CHOL, HDL, LDLCALC, TRIG, CHOLHDL, LDLDIRECT in the last 72 hours. Thyroid Function Tests: No results for input(s): TSH, T4TOTAL, FREET4, T3FREE, THYROIDAB in the last 72 hours. Anemia Panel: No results for input(s): VITAMINB12, FOLATE, FERRITIN, TIBC, IRON, RETICCTPCT in the last 72 hours. Urine analysis:    Component Value Date/Time   COLORURINE YELLOW 06/03/2019 2257   APPEARANCEUR CLOUDY (A) 06/03/2019 2257   LABSPEC 1.013 06/03/2019 2257   PHURINE 7.0 06/03/2019 2257   GLUCOSEU NEGATIVE 06/03/2019 2257   HGBUR NEGATIVE 06/03/2019 2257   BILIRUBINUR NEGATIVE 06/03/2019 2257   KETONESUR NEGATIVE 06/03/2019 2257   PROTEINUR NEGATIVE 06/03/2019 2257   NITRITE NEGATIVE 06/03/2019 2257   LEUKOCYTESUR TRACE (A) 06/03/2019  2257   Sepsis Labs: _0 (procalcitonin:4,lacticacidven:4)  ) Recent Results (from the past 240 hour(s))  SARS Coronavirus 2 [facility] order, Performed in [facility] hospital lab) Nasopharyngeal Nasopharyngeal Swab     Status: None   Collection Time: 06/03/19  7:55 PM   Specimen: Nasopharyngeal Swab  Result Value Ref Range Status   SARS Coronavirus 2 NEGATIVE NEGATIVE Final    Comment: (NOTE) If result is NEGATIVE SARS-CoV-2 target nucleic acids are NOT DETECTED. The SARS-CoV-2 RNA is generally detectable in upper and lower  respiratory specimens during the acute phase of infection. The lowest  concentration of SARS-CoV-2 viral copies this assay can detect is 250  copies / mL. A negative result does not preclude SARS-CoV-2 infection  and should not be used as the sole basis for  treatment or other  patient management decisions.  A negative result may occur with  improper specimen collection / handling, submission of specimen other  than nasopharyngeal swab, presence of viral mutation(s) within the  areas targeted by this assay, and inadequate number of viral copies  (<250 copies / mL). A negative result must be combined with clinical  observations, patient history, and epidemiological information. If result is POSITIVE SARS-CoV-2 target nucleic acids are DETECTED. The SARS-CoV-2 RNA is generally detectable in upper and lower  respiratory specimens dur ing the acute phase of infection.  Positive  results are indicative of active infection with SARS-CoV-2.  Clinical  correlation with patient history and other diagnostic information is  necessary to determine patient infection status.  Positive results do  not rule out bacterial infection or co-infection with other viruses. If result is PRESUMPTIVE POSTIVE SARS-CoV-2 nucleic acids MAY BE PRESENT.   A presumptive positive result was obtained on the submitted specimen  and confirmed on repeat testing.  While 2019 novel coronavirus  (SARS-CoV-2) nucleic acids may be present in the submitted sample  additional confirmatory testing may be necessary for epidemiological  and / or clinical management purposes  to differentiate between  SARS-CoV-2 and other Sarbecovirus currently known to infect humans.  If clinically indicated additional testing with an alternate test  methodology [phone]) is advised. The SARS-CoV-2 RNA is generally  detectable in upper and lower respiratory sp ecimens during the acute  phase of infection. The expected result is Negative. Fact Sheet for Patients:  StrictlyIdeas.no Fact Sheet for Healthcare Providers: BankingDealers.co.za This test is not yet approved or cleared by the [place] FDA and has been authorized for detection and/or  diagnosis of SARS-CoV-2 by FDA under an Emergency Use Authorization (EUA).  This EUA will remain in effect (meaning this test can be used) for the duration of the COVID-19 declaration under Section 564(b)(1) of the Act, 21 U.S.C. section 360bbb-3(b)(1), unless the authorization is terminated or revoked sooner. Performed at [facility], [address], [place], [place] [address]   Culture, blood (x 2)     Status: None   Collection Time: 06/03/19 11:07 PM   Specimen: BLOOD  Result Value Ref Range Status   Specimen Description BLOOD LEFT ANTECUBITAL  Final   Special Requests   Final    BOTTLES DRAWN AEROBIC AND ANAEROBIC Blood Culture adequate volume   Culture   Final    NO GROWTH 5 DAYS Performed at [facility], [address], [place], [place] [address]    Report Status 06/08/2019 FINAL  Final  Culture, blood (x 2)     Status: None   Collection Time: 06/03/19 11:08 PM   Specimen: BLOOD  Result  Value Ref Range Status   Specimen Description BLOOD RIGHT ANTECUBITAL  Final   Special Requests   Final    BOTTLES DRAWN AEROBIC AND ANAEROBIC Blood Culture adequate volume   Culture   Final    NO GROWTH 5 DAYS Performed at [facility], [address], [place], [place] [address]    Report Status 06/08/2019 FINAL  Final  Surgical PCR screen     Status: Abnormal   Collection Time: 06/04/19  1:33 AM   Specimen: Nasal Mucosa; Nasal Swab  Result Value Ref Range Status   MRSA, PCR POSITIVE (A) NEGATIVE Final    Comment: RESULT CALLED TO, READ BACK BY AND VERIFIED WITH: MCKINNEY, S. _0  ON 9.30.2020 BY [email or web address]    Staphylococcus aureus POSITIVE (A) NEGATIVE Final    Comment: (NOTE) The Xpert SA Assay (FDA approved for NASAL specimens in patients 63 years of age and older), is one component of a comprehensive surveillance program. It is not intended to diagnose infection nor to guide or monitor treatment. Performed at [facility], [place] [address], [place], [place]  [address]          Radiology Studies: No results found.      Scheduled Meds: . clopidogrel  75 mg Oral Daily  . docusate sodium  100 mg Oral BID  . DULoxetine  60 mg Oral Daily  . enoxaparin (LOVENOX) injection  40 mg Subcutaneous Q24H  . folic acid  948 mcg Oral Daily  . memantine  5 mg Oral BID  . mirabegron ER  25 mg Oral Daily  . mupirocin ointment  1 application Nasal BID  . pantoprazole  40 mg Oral Daily  . cyanocobalamin  1,000 mcg Oral Daily   Continuous Infusions: . methocarbamol (ROBAXIN) IV Stopped (06/06/19 0647)     LOS: 5 days    Time spent: 25 minutes    Edwin Dada, MD [facility] 06/08/2019, 1:21 PM     Please page through [place]:  [email or web address] Password [email or web address] If 7PM-7AM, please contact night-coverage

## 2019-06-09 LAB — BASIC METABOLIC PANEL
Anion gap: 4 — ABNORMAL LOW (ref 5–15)
BUN: 13 mg/dL (ref 8–23)
CO2: 27 mmol/L (ref 22–32)
Calcium: 8.5 mg/dL — ABNORMAL LOW (ref 8.9–10.3)
Chloride: 106 mmol/L (ref 98–111)
Creatinine, Ser: 0.7 mg/dL (ref 0.44–1.00)
GFR calc Af Amer: >60 mL/min (ref >60)
GFR calc non Af Amer: >60 mL/min (ref >60)
Glucose, Bld: 111 mg/dL — ABNORMAL HIGH (ref 70–99)
Potassium: 3 mmol/L — ABNORMAL LOW (ref 3.5–5.1)
Sodium: 137 mmol/L (ref 135–145)

## 2019-06-09 LAB — CBC
HCT: 31.2 % — ABNORMAL LOW (ref 36.0–46.0)
Hemoglobin: 9.7 g/dL — ABNORMAL LOW (ref 12.0–15.0)
MCH: 29.9 pg (ref 26.0–34.0)
MCHC: 31.1 g/dL (ref 30.0–36.0)
MCV: 96.3 fL (ref 80.0–100.0)
Platelets: 154 10*3/uL (ref 150–400)
RBC: 3.24 MIL/uL — ABNORMAL LOW (ref 3.87–5.11)
RDW: 20.1 % — ABNORMAL HIGH (ref 11.5–15.5)
WBC: 10 10*3/uL (ref 4.0–10.5)
nRBC: 0.5 % — ABNORMAL HIGH (ref 0.0–0.2)

## 2019-06-09 MED ORDER — POTASSIUM CHLORIDE CRYS ER 20 MEQ PO TBCR
40.0000 meq | EXTENDED_RELEASE_TABLET | Freq: Once | ORAL | Status: AC
  Administered 2019-06-09: 40 meq via ORAL
  Filled 2019-06-09: qty 2

## 2019-06-09 NOTE — Progress Notes (Signed)
Physical Therapy Treatment Patient Details Name: [name] MRN: [id number] DOB: 1940/09/13 Today's Date: 06/09/2019    History of Present Illness [name]  is an 78 y.o. female s/p L THR as conversion from L hemi-arthroplasty with multiple dislocations and with hx of gerd, hypothyroidism, RA,  hypertension, h/o CVA, dementia,  h/o breast cancer, neuropathy.    PT Comments    Pt awake but for most session confused.  Pleasant.  Unable to follow commands.  Speaking mis match phrases.  Required redirection to stay focused to task.  When I asked repeatable, pt did say her daughter's name was [name].  And repeated "I hurt".  Attempted to get pt OOB to recliner but unable. General bed mobility comments: Pt not following cues - assisted to/from EOB sitting with use of pad and Total Assist.  tolerated sitting EOB x 4 min at Mod Assist with poor kyphotic posture and decreased WBing L side due to pain.   General transfer comment: attempted sit to stand tqwide with + 2 Total Assist side by side.  Pt unable to clear hips off bed.  Incont BM upond standing.  Assisted back to bed for hygiene as pt was unable to tolerate standing. Assisted back to bed and performed side to side rolling for hygiene.  Rec Maxi Move for OOB.   Follow Up Recommendations  SNF     Equipment Recommendations       Recommendations for Other Services       Precautions / Restrictions Precautions Precautions: Fall Required Braces or Orthoses: Other Brace Other Brace: L hip abductor brace "on all times" Restrictions Weight Bearing Restrictions: No LLE Weight Bearing: Weight bearing as tolerated    Mobility  Bed Mobility Overal bed mobility: Needs Assistance Bed Mobility: Supine to Sit;Sit to Supine     Supine to sit: Total assist;+2 for physical assistance;+2 for safety/equipment(pt 5%) Sit to supine: Total assist;+2 for physical assistance;+2 for safety/equipment(pt 0%)   General bed mobility comments: Pt not  following cues - assisted to/from EOB sitting with use of pad and Total Assist.  tolerated sitting EOB x 4 min at Mod Assist with poor kyphotic posture and decreased WBing L side due to pain  Transfers                 General transfer comment: attempted sit to stand tqwide with + 2 Total Assist side by side.  Pt unable to clear hips off bed.  Incont BM upond standing.  Assisted back to bed for hygiene as pt was unable to tolerate standing.  Ambulation/Gait                 Stairs             Wheelchair Mobility    Modified Rankin (Stroke Patients Only)       Balance                                            Cognition Arousal/Alertness: Awake/alert                                     General Comments: AxO X .5    easily distracted, unable to follow commands, unable to follow directions      Exercises      General  Comments        Pertinent Vitals/Pain Pain Assessment: Faces Faces Pain Scale: Hurts little more Pain Location: L hip with movement Pain Descriptors / Indicators: Grimacing Pain Intervention(s): Monitored during session;Repositioned    Home Living                      Prior Function            PT Goals (current goals can now be found in the care plan section) Progress towards PT goals: Progressing toward goals    Frequency    Min 3X/week      PT Plan Current plan remains appropriate    Co-evaluation              AM-PAC PT "6 Clicks" Mobility   Outcome Measure  Help needed turning from your back to your side while in a flat bed without using bedrails?: Total Help needed moving from lying on your back to sitting on the side of a flat bed without using bedrails?: Total Help needed moving to and from a bed to a chair (including a wheelchair)?: Total Help needed standing up from a chair using your arms (e.g., wheelchair or bedside chair)?: Total Help needed to walk in  hospital room?: Total Help needed climbing 3-5 steps with a railing? : Total 6 Click Score: 6    End of Session Equipment Utilized During Treatment: Gait belt Activity Tolerance: Patient limited by fatigue;Patient limited by pain;Other (comment)(cognition) Patient left: in bed;with call bell/phone within reach;with bed alarm set Nurse Communication: Mobility status PT Visit Diagnosis: Muscle weakness (generalized) (M62.81);Difficulty in walking, not elsewhere classified (R26.2)     Time: [id number] PT Time Calculation (min) (ACUTE ONLY): 24 min  Charges:  $Therapeutic Activity: 23-37 mins                     Rica Koyanagi  PTA Acute  Rehabilitation Services Pager      [phone] Office      [phone]

## 2019-06-09 NOTE — NC FL2 (Signed)
[place] LEVEL OF CARE SCREENING TOOL     IDENTIFICATION  Patient Name: [name] Birthdate: [birth year] Sex: female Admission Date (Current Location): 06/03/2019  [facility] and [place] Number:  Herbalist and Address:  [facility],  [place] [address] [place]      Provider Number: [id number]  Attending Physician Name and Address:  Terrilee Croak, MD  Relative Name and Phone Number:  [place] [phone]  [phone]    Current Level of Care: Hospital Recommended Level of Care: [place] Prior Approval Number:    Date Approved/Denied:   PASRR Number: [id number]  Discharge Plan: SNF    Current Diagnoses: Patient Active Problem List   Diagnosis Date Noted  . Closed dislocation of hip, left, initial encounter ([place]) 06/03/2019  . Dislocation of hip, closed, left, initial encounter ([place]) 06/03/2019  . Anxiety   . Femoral neck fracture ([place]) 04/19/2019  . History of stroke 04/19/2019  . Cardioembolic stroke ([place]) 123456  . Acute cardioembolic stroke ([place]) 123456  . Acute colitis 08/01/2018  . Hypothyroidism 08/01/2018  . Hyponatremia 08/01/2018  . Hypokalemia 08/01/2018  . Infarction of right basal ganglia ([place]) 08/01/2018  . Hypertension 08/01/2018  . Dementia ([place]) 08/01/2018  . Melena 05/07/2018  . Esophageal dysphagia 05/07/2018  . Osteoporosis with pathological fracture of forearm 10/03/2016  . Lumbar radiculopathy 03/13/2016  . Invasive ductal carcinoma of left breast ([place]) 02/24/2014  . 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 Multiple thyroid nodules 06/03/2013  . Rheumatoid arthritis ([place]) 07/15/2008    Orientation RESPIRATION BLADDER Height & Weight     Self  Normal Continent, External catheter Weight: 123 lb 7.3 oz (56 kg) Height:  5\' 2"  (157.5 cm)  BEHAVIORAL SYMPTOMS/MOOD NEUROLOGICAL BOWEL NUTRITION STATUS      Continent  Diet(REGULAR)  AMBULATORY STATUS COMMUNICATION OF NEEDS Skin   Extensive Assist Verbally Surgical wounds                       Personal Care Assistance Level of Assistance  Bathing, Feeding, Dressing Bathing Assistance: Maximum assistance Feeding assistance: Limited assistance Dressing Assistance: Maximum assistance     Functional Limitations Info  Sight, Hearing, Speech Sight Info: Impaired Hearing Info: Adequate Speech Info: Adequate    SPECIAL CARE FACTORS FREQUENCY  PT (By licensed PT), OT (By licensed OT)     PT Frequency: 5X/WEEK OT Frequency: 5X/WEEK            Contractures Contractures Info: Not present    Additional Factors Info  Code Status, Allergies, Psychotropic Code Status Info: FULLCODE Allergies Info: Allergies: Celecoxib, Ibuprofen, Diphenhydramine, Wygesic Propoxyphene N-acetaminophen Psychotropic Info: CYMBALTA         Current Medications (06/09/2019):  This is the current hospital active medication list Current Facility-Administered Medications  Medication Dose Route Frequency Provider Last Rate Last Dose  . acetaminophen (TYLENOL) tablet 650 mg  650 mg Oral Q6H PRN Polly Cobia, RPH   650 mg at 06/09/19 1015   Or  . acetaminophen (TYLENOL) solution 650 mg  650 mg Oral Q6H PRN Wofford, Cindie Laroche, RPH       Or  . acetaminophen (TYLENOL) suppository 650 mg  650 mg Rectal Q6H PRN Wofford, Drew A, RPH      . albuterol (PROVENTIL) (2.5 MG/3ML) 0.083% nebulizer solution 3 mL  3 mL Inhalation Q6H PRN Rod Can, MD      . alum & Iris Pert  hydroxide-simeth (MAALOX/MYLANTA) 200-200-20 MG/5ML suspension 30 mL  30 mL Oral Q4H PRN Swinteck, Aaron Edelman, MD      . clopidogrel (PLAVIX) tablet 75 mg  75 mg Oral Daily Rod Can, MD   75 mg at 06/09/19 1015  . docusate sodium (COLACE) capsule 100 mg  100 mg Oral BID Rod Can, MD   100 mg at 06/09/19 1017  . DULoxetine (CYMBALTA) DR capsule 60 mg  60 mg Oral Daily Rod Can, MD   60 mg at 06/09/19  1015  . enoxaparin (LOVENOX) injection 40 mg  40 mg Subcutaneous Q24H Swinteck, Aaron Edelman, MD   40 mg at [id number]  . folic acid (FOLVITE) tablet 0.5 mg  500 mcg Oral Daily Rod Can, MD   0.5 mg at 06/09/19 1014  . labetalol (NORMODYNE) injection 10 mg  10 mg Intravenous Q2H PRN Swinteck, Aaron Edelman, MD      . memantine [facility]) tablet 5 mg  5 mg Oral BID Rod Can, MD   5 mg at 06/09/19 1015  . menthol-cetylpyridinium (CEPACOL) lozenge 3 mg  1 lozenge Oral PRN Swinteck, Aaron Edelman, MD       Or  . phenol (CHLORASEPTIC) mouth spray 1 spray  1 spray Mouth/Throat PRN Swinteck, Aaron Edelman, MD      . methocarbamol (ROBAXIN) tablet 500 mg  500 mg Oral Q6H PRN Rod Can, MD   500 mg at 06/07/19 1811   Or  . methocarbamol (ROBAXIN) 500 mg in dextrose 5 % 50 mL IVPB  500 mg Intravenous Q6H PRN Rod Can, MD   Stopped at 06/06/19 [phone]  . metoCLOPramide (REGLAN) tablet 5-10 mg  5-10 mg Oral Q8H PRN Swinteck, Aaron Edelman, MD       Or  . metoCLOPramide (REGLAN) injection 5-10 mg  5-10 mg Intravenous Q8H PRN Swinteck, Aaron Edelman, MD      . mirabegron ER (MYRBETRIQ) tablet 25 mg  25 mg Oral Daily Rod Can, MD   25 mg at 06/09/19 1014  . morphine 2 MG/ML injection 1-3 mg  1-3 mg Intravenous Q4H PRN Rod Can, MD   1 mg at 06/06/19 2235  . ondansetron (ZOFRAN) tablet 4 mg  4 mg Oral Q6H PRN Swinteck, Aaron Edelman, MD       Or  . ondansetron (ZOFRAN) injection 4 mg  4 mg Intravenous Q6H PRN Swinteck, Aaron Edelman, MD      . pantoprazole (PROTONIX) EC tablet 40 mg  40 mg Oral Daily Rod Can, MD   40 mg at 06/09/19 1015  . polyethylene glycol (MIRALAX / GLYCOLAX) packet 17 g  17 g Oral Daily PRN Swinteck, Aaron Edelman, MD      . vitamin B-12 (CYANOCOBALAMIN) tablet 1,000 mcg  1,000 mcg Oral Daily Rod Can, MD   1,000 mcg at 06/09/19 1014     Discharge Medications: Please see discharge summary for a list of discharge medications.  Relevant Imaging Results:  Relevant Lab Results:   Additional  Information SS# [place] Elanor [place], [place]

## 2019-06-09 NOTE — Progress Notes (Signed)
Patient to transfer to 1601. DTR notified. All belongings with patient. Report called to [facility].

## 2019-06-09 NOTE — TOC Initial Note (Addendum)
Transition of Care [facility]) - Initial/Assessment Note    Patient Details  Name: [name] MRN: [id number] Date of Birth: 10-05-1940  Transition of Care [facility]) CM/SW Contact:    Lia Hopping, [place] Phone Number: 06/09/2019, 11:49 AM  Clinical Narrative:   Patient admitted for dislocated left hip hemiarthroplasty.                CSW discussed disposition needs with the patient daughter. Patient daughter wants to pursue SNF placement for short rehab. Daughter lives in the home with the patient. She reports the patient is ambulatory with a rolling walker. The patient is able to feed herself and needs assistance with baths and getting dress. Daughter reports the patient is confused and does require supervision. Daughter reports a few months ago the patient  was at [facility] for fall/fracture. Patient insurance denied rehab stay. Patient was receiving Home physical therapy with [facility] for about 4 weeks. Daughter states, " My mother needs consistent therapy, 2-3 times a week is not enough. I will appeal if she is denied this time." CSW explain SNF placement process. Daughter prefers a facility in [place]. She is open to [place] area as well.   TOC staff will follow up with bed offers.   FL2 done, Pasrr done.  Ship broker HTA initiated- 11:57am.   Expected Discharge Plan: Skilled Nursing Facility Barriers to Discharge: Insurance Authorization   Patient Goals and CMS Choice Patient states their goals for this hospitalization and ongoing recovery are:: Daughter: to walk CMS Medicare.gov Compare Post Acute Care list provided to:: Other (Comment Required)(Adult Children) Choice offered to / list presented to : Adult Children  Expected Discharge Plan and Services Expected Discharge Plan: [place] In-house Referral: Clinical Social Work Discharge Planning Services: CM Consult Post Acute Care Choice: [place] arrangements for  the past 2 months: Apartment                                      Prior Living Arrangements/Services Living arrangements for the past 2 months: Apartment Lives with:: Adult Children Patient language and need for interpreter reviewed:: No        Need for Family Participation in Patient Care: Yes (Comment) Care giver support system in place?: Yes (comment)   Criminal Activity/Legal Involvement Pertinent to Current Situation/Hospitalization: No - Comment as needed  Activities of Daily Living Home Assistive Devices/Equipment: Environmental consultant (specify type), Hand-held shower hose, Shower chair with back, Eyeglasses, Wheelchair, Tub transfer bench ADL Screening (condition at time of admission) Patient's cognitive ability adequate to safely complete daily activities?: No Is the patient deaf or have difficulty hearing?: No Does the patient have difficulty seeing, even when wearing glasses/contacts?: No Does the patient have difficulty concentrating, remembering, or making decisions?: Yes Patient able to express need for assistance with ADLs?: No Does the patient have difficulty dressing or bathing?: Yes Independently performs ADLs?: No Communication: Needs assistance Is this a change from baseline?: Pre-admission baseline Dressing (OT): Dependent Is this a change from baseline?: Pre-admission baseline Grooming: Dependent Is this a change from baseline?: Pre-admission baseline Feeding: Dependent Is this a change from baseline?: Pre-admission baseline Bathing: Dependent Is this a change from baseline?: Pre-admission baseline Toileting: Dependent Is this a change from baseline?: Pre-admission baseline In/Out Bed: Dependent Is this a change from baseline?: Pre-admission baseline Walks in Home: Dependent(28 feet at best with assist at home) Is  this a change from baseline?: Pre-admission baseline Does the patient have difficulty walking or climbing stairs?: Yes Weakness of Legs:  Both Weakness of Arms/Hands: Both  Permission Sought/Granted Permission sought to share information with : Facility Sport and exercise psychologist, Family Supports       Permission granted to share info w AGENCY: SNF  Permission granted to share info w Relationship: Daughter  Permission granted to share info w Contact Information: [name]  [phone]  [phone]  Emotional Assessment Appearance:: Appears stated age Attitude/Demeanor/Rapport: Unable to Assess Affect (typically observed): Unable to Assess   Alcohol / Substance Use: Not Applicable Psych Involvement: No (comment)  Admission diagnosis:  Preop examination [Z01.818] Hip dislocation, left, initial encounter ([place]) [S73.005A] Dislocation of hip, closed, left, initial encounter [facility]) [S73.005A] Patient Active Problem List   Diagnosis Date Noted  . Closed dislocation of hip, left, initial encounter ([place]) 06/03/2019  . Dislocation of hip, closed, left, initial encounter ([place]) 06/03/2019  . Anxiety   . Femoral neck fracture ([place]) 04/19/2019  . History of stroke 04/19/2019  . Cardioembolic stroke ([place]) 123456  . Acute cardioembolic stroke ([place]) 123456  . Acute colitis 08/01/2018  . Hypothyroidism 08/01/2018  . Hyponatremia 08/01/2018  . Hypokalemia 08/01/2018  . Infarction of right basal ganglia ([place]) 08/01/2018  . Hypertension 08/01/2018  . Dementia ([place]) 08/01/2018  . Melena 05/07/2018  . Esophageal dysphagia 05/07/2018  . Osteoporosis with pathological fracture of forearm 10/03/2016  . Lumbar radiculopathy 03/13/2016  . Invasive ductal carcinoma of left breast ([place]) 02/24/2014  . 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 Multiple thyroid nodules 06/03/2013  . Rheumatoid arthritis ([place]) 07/15/2008   PCP:  Celene Squibb, MD Pharmacy:   [place], [address] [place] [address] Phone: [phone] Fax:  [phone]     Social Determinants of Health (SDOH) Interventions    Readmission Risk Interventions No flowsheet data found.

## 2019-06-09 NOTE — Care Management Important Message (Signed)
Important Message  Patient Details IM Letter given to Kathrin Greathouse SW to present to the Patient Name: [name] MRN: [id number] Date of Birth: 06-12-1941   Medicare Important Message Given:  Yes     Kerin Salen 06/09/2019, 11:30 AM

## 2019-06-09 NOTE — Progress Notes (Signed)
PROGRESS NOTE  Stachia Slutsky Libman  DOB: 1941-03-14  PCP: Celene Squibb, MD [id number]  DOA: 06/03/2019  LOS: 6 days   Brief narrative: [name] is a 78 y.o. F with advanced dementia, home-dwelling, hx CVA without residual weakness, hx remote BrCA, HTN, RA on methotrexate and recent hip fracture repair who presented with recurrent left hip pain.  Patient had had left posterior hemiarthroplasty for hip fracture about 6 weeks ago.  Had a dislocation of the hip 2 weeks ago, reduced in the ER, has been in bed, with knee immobilizer since, but on day of admission, complained of pain.  In the ER, radiography showed recurrent superior dislocation of the femoral head prosthesis.  Reduction with conscious sedation failed in the ER.  Orthopedics were consulted, who recommended conversion to total hip arthroplasty and the patient was admitted.  Subjective: Patient was seen and examined this morning.  Not in distress.  No new symptoms.  Pending SNF bed availability  Assessment/Plan:  Closed LEFT hip fracture, recurrent dislocation of prosthesis s/p total hip arthroplasty 10/2 Afebrile  -Continue Lovenox for DVT ppx -Orthopedics recommend WBAT with walker -Hip abduction brace at all times -PT eval  Cerebrovascular disease secondary prevention Hypertension BP controlled -Continue to hold lisinopril -Continue Plavix -Continue dysphagia 3 diet  Dementia Mentation is confused and babbling, appears to be near baseline.  Able to follow commands. -Continue Namenda -Continue duloxetine Delirium precautions:              -Lights and TV off, minimize interruptions at night             -Blinds open and lights on during day             -Glasses/hearing aid with patient             -Frequent reorientation             -PT/OT when able             -Avoid sedation medications/Beers list medications   Rheumatoid arthritis No active disease -Hold methotrexate until 10/16   Other  medications -Continue mirabegron -Continue PPI  Hypokalemia -Potassium low at 3 -Oral replacement with 40 mEq given.  Acute blood loss anemia Expected post-op blood loss. Transfused 10/1 and 10/3.  Hgb increased appropriately post-transfusion  Body mass index is 22.58 kg/m. Mobility: Seen by physical therapy DVT prophylaxis:  Lovenox subcu Code Status:   Code Status: Full Code  Family Communication:  Expected Discharge:  Pending bed availability  Consultants:  Orthopedics  Procedures: S/p total hip arthroplasty 10/2  Antimicrobials: Anti-infectives (From admission, onward)   Start     Dose/Rate Route Frequency Ordered Stop   06/06/19 0600  ceFAZolin (ANCEF) IVPB 2g/100 mL premix     2 g 200 mL/hr over 30 Minutes Intravenous On call to O.R. 06/05/19 1248 06/05/19 1439   06/05/19 2030  ceFAZolin (ANCEF) IVPB 2g/100 mL premix     2 g 200 mL/hr over 30 Minutes Intravenous Every 6 hours 06/05/19 1959 06/06/19 0332      Diet Order            Diet regular Room service appropriate? Yes; Fluid consistency: Thin  Diet effective now              Infusions:  . methocarbamol (ROBAXIN) IV Stopped (06/06/19 9983)    Scheduled Meds: . clopidogrel  75 mg Oral Daily  . docusate sodium  100 mg Oral BID  . DULoxetine  60  mg Oral Daily  . enoxaparin (LOVENOX) injection  40 mg Subcutaneous Q24H  . folic acid  144 mcg Oral Daily  . memantine  5 mg Oral BID  . mirabegron ER  25 mg Oral Daily  . pantoprazole  40 mg Oral Daily  . cyanocobalamin  1,000 mcg Oral Daily    PRN meds: acetaminophen **OR** acetaminophen (TYLENOL) oral liquid 160 mg/5 mL **OR** acetaminophen, albuterol, alum & mag hydroxide-simeth, labetalol, menthol-cetylpyridinium **OR** phenol, methocarbamol **OR** methocarbamol (ROBAXIN) IV, metoCLOPramide **OR** metoCLOPramide (REGLAN) injection, morphine injection, ondansetron **OR** ondansetron (ZOFRAN) IV, polyethylene glycol   Objective: Vitals:   06/09/19  0536 06/09/19 1213  BP: 131/68 120/63  Pulse: 68 78  Resp: 16 20  Temp: 98.5 F (36.9 C) 98 F (36.7 C)  SpO2: 98% 94%    Intake/Output Summary (Last 24 hours) at 06/09/2019 1600 Last data filed at 06/09/2019 1030 Gross per 24 hour  Intake 60 ml  Output 500 ml  Net -440 ml   Filed Weights   06/03/19 1345 06/04/19 0007 06/09/19 1213  Weight: 60 kg 56.1 kg 56 kg   Weight change:  Body mass index is 22.58 kg/m.   Physical Exam: General exam: Appears calm and comfortable.  Skin: No rashes, lesions or ulcers. HEENT: Atraumatic, normocephalic, supple neck, no obvious bleeding Lungs: Clear to auscultation bilaterally CVS: Regular rate and rhythm, no murmur GI/Abd soft, nontender, nondistended, bowel sound present CNS: Alert, awake, oriented to place and person Psychiatry: Mood appropriate Extremities: No pedal edema, no calf tenderness  Data Review: I have personally reviewed the laboratory data and studies available.  Recent Labs  Lab 06/05/19 1742 06/06/19 0246 06/07/19 0305 06/08/19 0239 06/09/19 0307  WBC 18.5* 10.9* 14.6* 12.0* 10.0  HGB 6.6* 8.2* 7.1* 9.6* 9.7*  HCT 22.1* 26.8* 23.2* 30.4* 31.2*  MCV 106.3* 93.1 95.1 94.4 96.3  PLT 243 172 225 176 154   Recent Labs  Lab 06/05/19 0321 06/06/19 0246 06/07/19 0305 06/08/19 0239 06/09/19 0307  NA 139 140 137 136 137  K 4.3 4.7 4.0 3.2* 3.0*  CL 110 111 109 106 106  CO2 23 20* '23 24 27  ' GLUCOSE 108* 166* 130* 109* 111*  BUN '15 16 16 13 13  ' CREATININE 0.71 0.68 0.59 0.63 0.70  CALCIUM 9.0 8.8* 8.7* 8.5* 8.5*    Terrilee Croak, MD  [facility] 06/09/2019

## 2019-06-10 LAB — BASIC METABOLIC PANEL
Anion gap: 7 (ref 5–15)
BUN: 14 mg/dL (ref 8–23)
CO2: 23 mmol/L (ref 22–32)
Calcium: 8.4 mg/dL — ABNORMAL LOW (ref 8.9–10.3)
Chloride: 103 mmol/L (ref 98–111)
Creatinine, Ser: 0.63 mg/dL (ref 0.44–1.00)
GFR calc Af Amer: >60 mL/min (ref >60)
GFR calc non Af Amer: >60 mL/min (ref >60)
Glucose, Bld: 117 mg/dL — ABNORMAL HIGH (ref 70–99)
Potassium: 3 mmol/L — ABNORMAL LOW (ref 3.5–5.1)
Sodium: 133 mmol/L — ABNORMAL LOW (ref 135–145)

## 2019-06-10 MED ORDER — POTASSIUM CHLORIDE CRYS ER 20 MEQ PO TBCR
40.0000 meq | EXTENDED_RELEASE_TABLET | ORAL | Status: DC
  Administered 2019-06-10: 10:00:00 40 meq via ORAL
  Filled 2019-06-10 (×2): qty 2

## 2019-06-10 MED ORDER — ADULT MULTIVITAMIN W/MINERALS CH
1.0000 | ORAL_TABLET | Freq: Every day | ORAL | Status: DC
  Administered 2019-06-10 – 2019-06-12 (×2): 1 via ORAL
  Filled 2019-06-10 (×3): qty 1

## 2019-06-10 NOTE — Progress Notes (Signed)
PROGRESS NOTE  Trenda Corliss Messner  DOB: 03-01-1941  PCP: Celene Squibb, MD FGH:[id number]  DOA: 06/03/2019  LOS: 7 days   Brief narrative: [name] is a 78 y.o. F with advanced dementia, home-dwelling, hx CVA without residual weakness, hx remote BrCA, HTN, RA on methotrexate and recent hip fracture repair who presented with recurrent left hip pain.  Patient had had left posterior hemiarthroplasty for hip fracture about 6 weeks ago.  Had a dislocation of the hip 2 weeks ago, reduced in the ER, has been in bed, with knee immobilizer since, but on day of admission, complained of pain.  In the ER, radiography showed recurrent superior dislocation of the femoral head prosthesis.  Reduction with conscious sedation failed in the ER.  Orthopedics were consulted, who recommended conversion to total hip arthroplasty and the patient was admitted.  Subjective: Patient was seen and examined this morning.  Not in distress.  No new symptoms.  Pending SNF bed availability  Assessment/Plan:  Closed LEFT hip fracture, recurrent dislocation of prosthesis s/p total hip arthroplasty 10/2 Afebrile  -Continue Lovenox for DVT ppx -Orthopedics recommend WBAT with walker -Hip abduction brace at all times -PT eval appreciated.  Pending placement.  Cerebrovascular disease secondary prevention Hypertension BP controlled -Continue to hold lisinopril -Continue Plavix -Continue dysphagia 3 diet  Dementia Mentation is confused and babbling, appears to be near baseline.  Able to follow commands. -Continue Namenda -Continue duloxetine Delirium precautions:              -Lights and TV off, minimize interruptions at night             -Blinds open and lights on during day             -Glasses/hearing aid with patient             -Frequent reorientation             -PT/OT when able             -Avoid sedation medications/Beers list medications   Rheumatoid arthritis No active disease -Hold  methotrexate until 10/16   Other medications -Continue mirabegron -Continue PPI  Hypokalemia -Potassium low at 3 yesterday.  Oral replacement given.  Repeat BMP today.  Acute blood loss anemia Expected post-op blood loss. Transfused 10/1 and 10/3.  Hgb increased appropriately post-transfusion  Body mass index is 22.58 kg/m. Mobility: Seen by physical therapy DVT prophylaxis:  Lovenox subcu Code Status:   Code Status: Full Code  Family Communication:  Expected Discharge:  Pending bed availability  Consultants:  Orthopedics  Procedures: S/p total hip arthroplasty 10/2  Antimicrobials: Anti-infectives (From admission, onward)   Start     Dose/Rate Route Frequency Ordered Stop   06/06/19 0600  ceFAZolin (ANCEF) IVPB 2g/100 mL premix     2 g 200 mL/hr over 30 Minutes Intravenous On call to O.R. 06/05/19 1248 06/05/19 1439   06/05/19 2030  ceFAZolin (ANCEF) IVPB 2g/100 mL premix     2 g 200 mL/hr over 30 Minutes Intravenous Every 6 hours 06/05/19 1959 06/06/19 0332      Diet Order            Diet regular Room service appropriate? Yes; Fluid consistency: Thin  Diet effective now              Infusions:   methocarbamol (ROBAXIN) IV Stopped (06/06/19 6789)    Scheduled Meds:  clopidogrel  75 mg Oral Daily   docusate sodium  100 mg  Oral BID   DULoxetine  60 mg Oral Daily   enoxaparin (LOVENOX) injection  40 mg Subcutaneous L40B   folic acid  828 mcg Oral Daily   memantine  5 mg Oral BID   mirabegron ER  25 mg Oral Daily   pantoprazole  40 mg Oral Daily   potassium chloride  40 mEq Oral Q2H   cyanocobalamin  1,000 mcg Oral Daily    PRN meds: acetaminophen **OR** acetaminophen (TYLENOL) oral liquid 160 mg/5 mL **OR** acetaminophen, albuterol, alum & mag hydroxide-simeth, labetalol, menthol-cetylpyridinium **OR** phenol, methocarbamol **OR** methocarbamol (ROBAXIN) IV, metoCLOPramide **OR** metoCLOPramide (REGLAN) injection, morphine injection,  ondansetron **OR** ondansetron (ZOFRAN) IV, polyethylene glycol   Objective: Vitals:   06/10/19 0008 06/10/19 0550  BP:  136/67  Pulse: 87 91  Resp:  16  Temp:  98.3 F (36.8 C)  SpO2: 96% 96%    Intake/Output Summary (Last 24 hours) at 06/10/2019 1051 Last data filed at 06/10/2019 0700 Gross per 24 hour  Intake 180 ml  Output 403 ml  Net -223 ml   Filed Weights   06/03/19 1345 06/04/19 0007 06/09/19 1213  Weight: 60 kg 56.1 kg 56 kg   Weight change:  Body mass index is 22.58 kg/m.   Physical Exam: General exam: Appears calm and comfortable.  Skin: No rashes, lesions or ulcers. HEENT: Atraumatic, normocephalic, supple neck, no obvious bleeding Lungs: Clear to auscultation bilaterally CVS: Regular rate and rhythm, no murmur GI/Abd soft, nontender, nondistended, bowel sound present CNS: Alert, awake, oriented to place and person Psychiatry: Mood appropriate Extremities: No pedal edema, no calf tenderness  Data Review: I have personally reviewed the laboratory data and studies available.  Recent Labs  Lab 06/05/19 1742 06/06/19 0246 06/07/19 0305 06/08/19 0239 06/09/19 0307  WBC 18.5* 10.9* 14.6* 12.0* 10.0  HGB 6.6* 8.2* 7.1* 9.6* 9.7*  HCT 22.1* 26.8* 23.2* 30.4* 31.2*  MCV 106.3* 93.1 95.1 94.4 96.3  PLT 243 172 225 176 154   Recent Labs  Lab 06/05/19 0321 06/06/19 0246 06/07/19 0305 06/08/19 0239 06/09/19 0307  NA 139 140 137 136 137  K 4.3 4.7 4.0 3.2* 3.0*  CL 110 111 109 106 106  CO2 23 20* _0 GLUCOSE 108* 166* 130* 109* 111*  BUN _1 CREATININE 0.71 0.68 0.59 0.63 0.70  CALCIUM 9.0 8.8* 8.7* 8.5* 8.5*    Terrilee Croak, MD  [facility] 06/10/2019

## 2019-06-10 NOTE — Progress Notes (Signed)
Nutrition Follow-up  INTERVENTION:   -Magic cup TID with meals, each supplement provides 290 kcal and 9 grams of protein -Multivitamin with minerals daily  NUTRITION DIAGNOSIS:   Increased nutrient needs related to acute illness as evidenced by estimated needs.  Ongoing.  GOAL:   Patient will meet greater than or equal to 90% of their needs  Progressing.  MONITOR:   PO intake, Supplement acceptance, Labs, Weight trends, I & O's  ASSESSMENT:   78 y.o. female with medical history significant for HTN, anxiety, hypothyroidism, CVA, dementia, left hip fracture s/p operative repair 04/19/2019, left prosthetic hip dislocation reduced in the ED on 05/28/2019, now returning to the ED with recurrent left hip pain. Patient has been in a knee immobilizer at home, has been essentially bedbound, is frequently restless and pulling at her knee immobilizer in bed and then began to complain of pain to the left hip. Daughter was concerned for recurrent dislocation and patient was brought into the ED with EMS. Reduction was attempted with conscious sedation in the ED but unsuccessful despite multiple attempts.  Orthopedic surgery was consulted and recommended admission.  **RD working remotely**  Patient now on regular diet, consuming 0-100%, refusing some meals. Pt continues to be alert/oriented x1. Will order Magic Cups with meals at this time as pt does not like Ensure supplements.   Admission weight: 132 lbs. Current weight: 123 lbs.  I/Os: +5.5L since admit UOP 10/5: 601 ml  Medications: Folic acid tablet, KLOR-CON, Vitamin B-12 tablet Labs reviewed: Low K  Diet Order:   Diet Order            Diet regular Room service appropriate? Yes; Fluid consistency: Thin  Diet effective now              EDUCATION NEEDS:   No education needs have been identified at this time  Skin:  Skin Assessment: Reviewed RN Assessment  Last BM:  10/6  Height:   Ht Readings from Last 1 Encounters:   06/09/19 5\' 2"  (1.575 m)    Weight:   Wt Readings from Last 1 Encounters:  06/09/19 56 kg    Ideal Body Weight:  50 kg  BMI:  Body mass index is 22.58 kg/m.  Estimated Nutritional Needs:   Kcal:  1600-1800 kcal  Protein:  70-80 grams  Fluid:  >/= 1.8 L/day  Clayton Bibles, MS, RD, LDN Inpatient Clinical Dietitian Pager: [phone] After Hours Pager: [phone]

## 2019-06-10 NOTE — TOC Progression Note (Signed)
Transition of Care [facility]) - Progression Note    Patient Details  Name: [name] MRN: [id number] Date of Birth: November 25, 1940  [facility] CM/SW Contact  Taquanna Borras, Marjie Skiff, RN Phone [phone] 06/10/2019, 2:26 PM  Clinical Narrative:     This CM contacted HTA on call nurse to inquire about the auth status for SNF. Per nurse, the pt is still in nurse review and will then need to go to medical review. TOC will continue to follow.  Expected Discharge Plan: Skilled Nursing Facility Barriers to Discharge: Insurance Authorization  Expected Discharge Plan and Services Expected Discharge Plan: [place] In-house Referral: Clinical Social Work Discharge Planning Services: CM Consult Post Acute Care Choice: [place] Living arrangements for the past 2 months: Apartment                                       Social Determinants of Health (SDOH) Interventions    Readmission Risk Interventions Readmission Risk Prevention Plan 06/10/2019  Transportation Screening Complete  Medication Review Press photographer) Complete  PCP or Specialist appointment within 3-5 days of discharge Not Complete  PCP/Specialist Appt Not Complete comments Not ready for dc  HRI or [place] Not Complete  HRI or Home Care Consult Pt Refusal Comments NA  SW Recovery Care/Counseling Consult Not Complete  SW Consult Not Complete Comments NA  Palliative Care Screening Not Applicable  Skilled Nursing Facility Complete  Some recent data might be hidden

## 2019-06-11 MED ORDER — DOCUSATE SODIUM 100 MG PO CAPS: 100.0000 mg | ORAL_CAPSULE | Freq: Two times a day (BID) | ORAL | 0 refills | Status: AC

## 2019-06-11 MED ORDER — METHOCARBAMOL 500 MG PO TABS: 500.0000 mg | ORAL_TABLET | Freq: Four times a day (QID) | ORAL | 0 refills | Status: AC | PRN

## 2019-06-11 MED ORDER — HYDROCODONE-ACETAMINOPHEN 5-325 MG PO TABS: 1.0000 | ORAL_TABLET | Freq: Four times a day (QID) | ORAL | 0 refills | Status: AC | PRN

## 2019-06-11 MED ORDER — POTASSIUM CHLORIDE CRYS ER 20 MEQ PO TBCR
40.0000 meq | EXTENDED_RELEASE_TABLET | Freq: Once | ORAL | Status: AC
  Administered 2019-06-11: 40 meq via ORAL
  Filled 2019-06-11: qty 2

## 2019-06-11 MED ORDER — METHOTREXATE 2.5 MG PO TABS: 15.0000 mg | ORAL_TABLET | ORAL | 0 refills | Status: AC

## 2019-06-11 MED ORDER — POTASSIUM CHLORIDE CRYS ER 10 MEQ PO TBCR
10.0000 meq | EXTENDED_RELEASE_TABLET | Freq: Every day | ORAL | Status: DC
  Administered 2019-06-12: 10 meq via ORAL
  Filled 2019-06-11: qty 1

## 2019-06-11 NOTE — TOC Transition Note (Signed)
Transition of Care [facility]) - CM/SW Discharge Note   Patient Details  Name: [name] MRN: [id number] Date of Birth: [birth year]  Transition of Care [facility]) CM/SW Contact:  Lynnell Catalan, RN Phone Number: 06/11/2019, 3:46 PM   Clinical Narrative:    This CM received auth from HTA for SNF [phone]). [facility] SNF is first choice for daughter. They are requesting another COVID test prior to dc. MD to order Covid testing for dc to facility tomorrow.   Final next level of care: Skilled Nursing Facility Barriers to Discharge: Insurance Authorization   Patient Goals and CMS Choice Patient states their goals for this hospitalization and ongoing recovery are:: Daughter: to walk CMS Medicare.gov Compare Post Acute Care list provided to:: Other (Comment Required)(Adult Children) Choice offered to / list presented to : Adult Children  Discharge Placement                       Discharge Plan and Services In-house Referral: Clinical Social Work Discharge Planning Services: CM Consult Post Acute Care Choice: Skilled Nursing Facility                               Social Determinants of Health (SDOH) Interventions     Readmission Risk Interventions Readmission Risk Prevention Plan 06/10/2019  Transportation Screening Complete  Medication Review Press photographer) Complete  PCP or Specialist appointment within 3-5 days of discharge Not Complete  PCP/Specialist Appt Not Complete comments Not ready for dc  HRI or [place] Not Complete  HRI or Home Care Consult Pt Refusal Comments NA  SW Recovery Care/Counseling Consult Not Complete  SW Consult Not Complete Comments NA  Palliative Care Screening Not Applicable  Skilled Nursing Facility Complete  Some recent data might be hidden

## 2019-06-11 NOTE — Discharge Summary (Signed)
Physician Discharge Summary  [name] KZL:[id number] DOB: [birth year] DOA: 06/03/2019  PCP: Celene Squibb, MD  Admit date: 06/03/2019 Discharge date: 06/11/2019  Admitted From: home Discharge disposition: SNF   Code Status: Full Code  Diet Recommendation: Dysphagia 3 diet   Recommendations for Outpatient Follow-Up:   1. Follow-up with PCP as an outpatient.   Discharge Diagnosis:   Principal Problem:   Closed dislocation of hip, left, initial encounter [facility]) Active Problems:   Rheumatoid arthritis ([place])   Hypokalemia   Hypertension   Dementia (HCC)   History of stroke   Anxiety  History of Present Illness / Brief narrative:  [name] is a78 y.o.F with advanced dementia, home-dwelling, hx CVA without residual weakness, hx remote BrCA, HTN, RA on methotrexate and recent hip fracture repair who presented with recurrent left hip pain.  Patient had had left posterior hemiarthroplasty for hip fracture about 6 weeks ago. Had a dislocation of the hip 2 weeks ago, reduced in the ER, has been in bed, with knee immobilizer since, but on day of admission, complained of pain.  In the ER, radiography showed recurrent superior dislocation of the femoral head prosthesis. Reduction with conscious sedation failed in the ER.  Orthopedics were consulted, who recommended conversion to total hip arthroplasty and the patient was admitted.   Subjective:  Patient seen and examined this morning.  He is an elderly Caucasian female.  Not in distress. Opens eyes on verbal command.  Nods head to answer simple questions.  Hospital Course:  Closed LEFT hip fracture, recurrent dislocation of prosthesis s/p total hip arthroplasty 10/2 Afebrile -ContinueLovenox for DVT ppx for a month post procedure. -Orthopedics recommendWBATwith walker -Hip abduction brace at all times -PT eval appreciated. SNF planned.  Cerebrovascular disease secondary prevention Hypertension BPcontrolled  -Lisinopril has remained on hold in the hospital.  Would not resume it since the blood pressure is mostly in the low normal range. -ContinuePlavix -Continuedysphagia 3 diet  Dementia Mentation is confused and babbling,appears to be near baseline. Able to follow commands. -ContinueNamenda -Continueduloxetine Delirium precautions:  -Lights and TV off, minimize interruptions at night -Blinds open and lights on during day -Glasses/hearing aid with patient -Frequent reorientation -Avoid sedation medications/[place] list medications  Rheumatoid arthritis -No active disease -Resume methotrexate on 10/16  Other medications -Continuemirabegron -ContinuePPI  Hypokalemia -Potassium low at 3 yesterday.  Oral replacement given.  Prior to admission, patient was on daily supplement of 10 mEq potassium which has been resumed.  Acute blood loss anemia Expected post-op blood loss. Transfused 10/1and 10/3.Hgb increased appropriately post-transfusion  Stable for discharge to SNF today.  Discharge Exam:   Vitals:   06/09/19 2009 06/10/19 0008 06/10/19 0550 06/10/19 1456  BP: 134/84  136/67 120/64  Pulse: (!) 42 87 91 93  Resp: '18  16 18  ' Temp: (!) 97.3 F (36.3 C)  98.3 F (36.8 C) 98.2 F (36.8 C)  TempSrc: Oral  Oral Oral  SpO2: (!) 81% 96% 96% 95%  Weight:      Height:        Body mass index is 22.58 kg/m.  General exam: Appears calm and comfortable.  Skin: No rashes, lesions or ulcers. HEENT: Atraumatic, normocephalic, supple neck, no obvious bleeding Lungs: Clear to auscultation bilaterally CVS: Regular rate and rhythm, no murmur GI/Abd soft, nontender, nondistended, bowel sound. CNS: Alert, awake, oriented to place and person, slow to respond Psychiatry: Mood appropriate Extremities: No pedal edema, no calf tenderness  Discharge Instructions:  Wound care: None  Discharge Instructions    Increase  activity slowly   Complete by: As directed      Follow-up Information    Schedule an appointment as soon as possible for a visit with Rod Can, MD.   Specialty: Orthopedic Surgery Why: For wound re-check, For suture removal Contact information: [address] [place] [address] [phone]          Allergies as of 06/11/2019      Reactions   Celecoxib Swelling, Other (See Comments)   Ibuprofen Swelling, Other (See Comments)   Diphenhydramine Rash, Other (See Comments)   Wide awake.    Wygesic [propoxyphene N-acetaminophen] Nausea And Vomiting      Medication List    STOP taking these medications   ALPRAZolam 0.25 MG tablet Commonly known as: XANAX   donepezil 10 MG tablet Commonly known as: ARICEPT   lisinopril 10 MG tablet Commonly known as: ZESTRIL     TAKE these medications   Caltrate 600+D 600-400 MG-UNIT tablet Generic drug: Calcium Carbonate-Vitamin D Take 1 tablet by mouth 2 (two) times daily.   cetirizine 10 MG tablet Commonly known as: ZYRTEC Take 10 mg by mouth daily as needed for allergies.   clopidogrel 75 MG tablet Commonly known as: PLAVIX Take 1 tablet (75 mg total) by mouth daily.   Cranberry 125 MG Tabs Take 1 tablet by mouth 3 (three) times daily as needed.   cyanocobalamin 1000 MCG tablet Take 1 tablet (1,000 mcg total) by mouth daily.   denosumab 60 MG/ML Soln injection Commonly known as: PROLIA Inject 60 mg into the skin every 6 (six) months. Administer in upper arm, thigh, or abdomen, got in June 2019   docusate sodium 100 MG capsule Commonly known as: COLACE Take 1 capsule (100 mg total) by mouth 2 (two) times daily.   DULoxetine 60 MG capsule Commonly known as: CYMBALTA Take 60 mg by mouth daily.   enoxaparin 40 MG/0.4ML injection Commonly known as: LOVENOX Inject 0.4 mLs (40 mg total) into the skin daily.   folic acid 716 MCG tablet Commonly known as: FOLVITE Take 800 mcg by mouth daily.    HYDROcodone-acetaminophen 5-325 MG tablet Commonly known as: NORCO/VICODIN Take 1 tablet by mouth every 6 (six) hours as needed for up to 3 days for moderate pain or severe pain.   iron polysaccharides 150 MG capsule Commonly known as: NIFEREX Take 1 capsule (150 mg total) by mouth daily.   Magnesium 500 MG Caps Take 500 mg by mouth daily.   memantine 5 MG tablet Commonly known as: NAMENDA Take 5 mg by mouth 2 (two) times daily.   methocarbamol 500 MG tablet Commonly known as: ROBAXIN Take 1 tablet (500 mg total) by mouth every 6 (six) hours as needed for up to 5 days for muscle spasms.   methotrexate 2.5 MG tablet Commonly known as: RHEUMATREX Take 6 tablets (15 mg total) by mouth every Wednesday. Start taking on: June 18, 2019   Myrbetriq 25 MG Tb24 tablet Generic drug: mirabegron ER Take 25 mg by mouth daily.   omeprazole 20 MG capsule Commonly known as: PRILOSEC Take 20 mg by mouth 2 (two) times daily before a meal.   potassium chloride 10 MEQ tablet Commonly known as: KLOR-CON Take 10 mEq by mouth at bedtime.   ProAir HFA 108 (90 Base) MCG/ACT inhaler Generic drug: albuterol Inhale 2 puffs into the lungs every 6 (six) hours as needed for wheezing or shortness of breath.   Tylenol 325 MG  tablet Generic drug: acetaminophen Take 500 mg by mouth every 6 (six) hours as needed for mild pain or moderate pain.   Vitamin D 50 MCG (2000 UT) Caps Take 2,000 Units by mouth daily.   Zinc 50 MG Caps Take 50 mg by mouth daily.       Time coordinating discharge: 35 minutes  The results of significant diagnostics from this hospitalization (including imaging, microbiology, ancillary and laboratory) are listed below for reference.    Procedures and Diagnostic Studies:   Dg Chest Port 1 View  Result Date: 06/03/2019 CLINICAL DATA:  Preoperative chest radiograph prior to hip surgery. EXAM: PORTABLE CHEST 1 VIEW COMPARISON:  08/29/2018 FINDINGS: UPPER limits normal  heart size noted. There is no evidence of focal airspace disease, pulmonary edema, suspicious pulmonary nodule/mass, pleural effusion, or pneumothorax. No acute bony abnormalities are identified. IMPRESSION: UPPER limits normal heart size without evidence of acute cardiopulmonary disease. Electronically Signed   By: Margarette Canada M.D.   On: 06/03/2019 21:05   Dg Hip Unilat With Pelvis 2-3 Views Left  Result Date: 06/03/2019 CLINICAL DATA:  Acute LEFT hip dislocation. EXAM: DG HIP (WITH OR WITHOUT PELVIS) 2-3V LEFT COMPARISON:  05/28/2019 FINDINGS: LEFT femoral prosthesis noted. SUPERIOR dislocation of the prosthetic LEFT femoral head noted. A few tiny bony fragments in the acetabular cavity are age indeterminate. IMPRESSION: SUPERIOR dislocation of the LEFT femoral head prosthesis. Electronically Signed   By: Margarette Canada M.D.   On: 06/03/2019 14:49     Labs:   Basic Metabolic Panel: Recent Labs  Lab 06/06/19 0246 06/07/19 0305 06/08/19 0239 06/09/19 0307 06/10/19 1108  NA 140 137 136 137 133*  K 4.7 4.0 3.2* 3.0* 3.0*  CL 111 109 106 106 103  CO2 20* '23 24 27 23  ' GLUCOSE 166* 130* 109* 111* 117*  BUN '16 16 13 13 14  ' CREATININE 0.68 0.59 0.63 0.70 0.63  CALCIUM 8.8* 8.7* 8.5* 8.5* 8.4*   GFR Estimated Creatinine Clearance: 44.4 mL/min (by C-G formula based on SCr of 0.63 mg/dL). Liver Function Tests: Recent Labs  Lab 06/05/19 0321 06/06/19 0246 06/08/19 0239  AST 25 34 40  ALT '14 18 18  ' ALKPHOS 76 60 75  BILITOT 1.1 1.0 1.4*  PROT 5.2* 5.0* 5.1*  ALBUMIN 2.4* 2.8* 2.5*   No results for input(s): LIPASE, AMYLASE in the last 168 hours. No results for input(s): AMMONIA in the last 168 hours. Coagulation profile No results for input(s): INR, PROTIME in the last 168 hours.  CBC: Recent Labs  Lab 06/05/19 1742 06/06/19 0246 06/07/19 0305 06/08/19 0239 06/09/19 0307  WBC 18.5* 10.9* 14.6* 12.0* 10.0  HGB 6.6* 8.2* 7.1* 9.6* 9.7*  HCT 22.1* 26.8* 23.2* 30.4* 31.2*  MCV  106.3* 93.1 95.1 94.4 96.3  PLT 243 172 225 176 154   Cardiac Enzymes: No results for input(s): CKTOTAL, CKMB, CKMBINDEX, TROPONINI in the last 168 hours. BNP: Invalid input(s): POCBNP CBG: Recent Labs  Lab 06/06/19 0109  GLUCAP 144*   D-Dimer No results for input(s): DDIMER in the last 72 hours. Hgb A1c No results for input(s): HGBA1C in the last 72 hours. Lipid Profile No results for input(s): CHOL, HDL, LDLCALC, TRIG, CHOLHDL, LDLDIRECT in the last 72 hours. Thyroid function studies No results for input(s): TSH, T4TOTAL, T3FREE, THYROIDAB in the last 72 hours.  Invalid input(s): FREET3 Anemia work up No results for input(s): VITAMINB12, FOLATE, FERRITIN, TIBC, IRON, RETICCTPCT in the last 72 hours. Microbiology Recent Results (from the past 240  hour(s))  SARS Coronavirus 2 [facility] order, Performed in [facility] hospital lab) Nasopharyngeal Nasopharyngeal Swab     Status: None   Collection Time: 06/03/19  7:55 PM   Specimen: Nasopharyngeal Swab  Result Value Ref Range Status   SARS Coronavirus 2 NEGATIVE NEGATIVE Final    Comment: (NOTE) If result is NEGATIVE SARS-CoV-2 target nucleic acids are NOT DETECTED. The SARS-CoV-2 RNA is generally detectable in upper and lower  respiratory specimens during the acute phase of infection. The lowest  concentration of SARS-CoV-2 viral copies this assay can detect is 250  copies / mL. A negative result does not preclude SARS-CoV-2 infection  and should not be used as the sole basis for treatment or other  patient management decisions.  A negative result may occur with  improper specimen collection / handling, submission of specimen other  than nasopharyngeal swab, presence of viral mutation(s) within the  areas targeted by this assay, and inadequate number of viral copies  (<250 copies / mL). A negative result must be combined with clinical  observations, patient history, and epidemiological information. If result is POSITIVE  SARS-CoV-2 target nucleic acids are DETECTED. The SARS-CoV-2 RNA is generally detectable in upper and lower  respiratory specimens dur ing the acute phase of infection.  Positive  results are indicative of active infection with SARS-CoV-2.  Clinical  correlation with patient history and other diagnostic information is  necessary to determine patient infection status.  Positive results do  not rule out bacterial infection or co-infection with other viruses. If result is PRESUMPTIVE POSTIVE SARS-CoV-2 nucleic acids MAY BE PRESENT.   A presumptive positive result was obtained on the submitted specimen  and confirmed on repeat testing.  While 2019 novel coronavirus  (SARS-CoV-2) nucleic acids may be present in the submitted sample  additional confirmatory testing may be necessary for epidemiological  and / or clinical management purposes  to differentiate between  SARS-CoV-2 and other Sarbecovirus currently known to infect humans.  If clinically indicated additional testing with an alternate test  methodology [phone]) is advised. The SARS-CoV-2 RNA is generally  detectable in upper and lower respiratory sp ecimens during the acute  phase of infection. The expected result is Negative. Fact Sheet for Patients:  StrictlyIdeas.no Fact Sheet for Healthcare Providers: BankingDealers.co.za This test is not yet approved or cleared by the [place] FDA and has been authorized for detection and/or diagnosis of SARS-CoV-2 by FDA under an Emergency Use Authorization (EUA).  This EUA will remain in effect (meaning this test can be used) for the duration of the COVID-19 declaration under Section 564(b)(1) of the Act, 21 U.S.C. section 360bbb-3(b)(1), unless the authorization is terminated or revoked sooner. Performed at [facility], [address], [place], [place] [address]   Culture, blood (x 2)     Status: None   Collection Time: 06/03/19 11:07  PM   Specimen: BLOOD  Result Value Ref Range Status   Specimen Description BLOOD LEFT ANTECUBITAL  Final   Special Requests   Final    BOTTLES DRAWN AEROBIC AND ANAEROBIC Blood Culture adequate volume   Culture   Final    NO GROWTH 5 DAYS Performed at [facility], [address] [place], [place] [address]    Report Status 06/08/2019 FINAL  Final  Culture, blood (x 2)     Status: None   Collection Time: 06/03/19 11:08 PM   Specimen: BLOOD  Result Value Ref Range Status   Specimen Description BLOOD RIGHT ANTECUBITAL  Final  Special Requests   Final    BOTTLES DRAWN AEROBIC AND ANAEROBIC Blood Culture adequate volume   Culture   Final    NO GROWTH 5 DAYS Performed at [facility], [address] [place], [place] [address]    Report Status 06/08/2019 FINAL  Final  Surgical PCR screen     Status: Abnormal   Collection Time: 06/04/19  1:33 AM   Specimen: Nasal Mucosa; Nasal Swab  Result Value Ref Range Status   MRSA, PCR POSITIVE (A) NEGATIVE Final    Comment: RESULT CALLED TO, READ BACK BY AND VERIFIED WITH: MCKINNEY, S. '@[id number]'  ON 9.30.2020 BY [email or web address]    Staphylococcus aureus POSITIVE (A) NEGATIVE Final    Comment: (NOTE) The Xpert SA Assay (FDA approved for NASAL specimens in patients 76 years of age and older), is one component of a comprehensive surveillance program. It is not intended to diagnose infection nor to guide or monitor treatment. Performed at [facility], [place] [address] [place], [place] [address]     Please note: You were cared for by a hospitalist during your hospital stay. Once you are discharged, your primary care physician will handle any further medical issues. Please note that NO REFILLS for any discharge medications will be authorized once you are discharged, as it is imperative that you return to your primary care physician (or establish a relationship with a primary care physician if you do not have one) for your post hospital  discharge needs so that they can reassess your need for medications and monitor your lab values.  Signed: Terrilee Croak  [facility] 06/11/2019, 11:28 AM

## 2019-06-11 NOTE — Progress Notes (Signed)
Physical Therapy Treatment Patient Details Name: [name] MRN: [id number] DOB: 01/22/1941 Today's Date: 06/11/2019    History of Present Illness [name]  is an 78 y.o. female s/p L THR as conversion from L hemi-arthroplasty with multiple dislocations and with hx of gerd, hypothyroidism, RA,  hypertension, h/o CVA, dementia,  h/o breast cancer, neuropathy.    PT Comments    +2 total assist for supine to sit, pt sat edge of bed x 6 minutes, +2 total assist for stand pivot transfer to recliner. Progress limited by dementia, pt with limited ability to follow commands.   Follow Up Recommendations  SNF     Equipment Recommendations       Recommendations for Other Services       Precautions / Restrictions Precautions Precautions: Fall;Posterior Hip Precaution Comments: sign in room for posterior precautions Required Braces or Orthoses: Other Brace Other Brace: L hip abductor brace "on all times" Restrictions Weight Bearing Restrictions: No LLE Weight Bearing: Weight bearing as tolerated    Mobility  Bed Mobility Overal bed mobility: Needs Assistance Bed Mobility: Supine to Sit     Supine to sit: Total assist;+2 for physical assistance;+2 for safety/equipment(pt 5%)     General bed mobility comments: Pt not following cues - assisted to/from EOB sitting with use of pad and Total Assist.  tolerated sitting EOB x 6 min with min A to supervision, posterior lean initially  Transfers Overall transfer level: Needs assistance Equipment used: 2 person hand held assist Transfers: Sit to/from [facility] Sit to Stand: +2 physical assistance;Total assist Stand pivot transfers: Total assist;+2 physical assistance       General transfer comment: + 2 Total Assist side by side for SPT, lift pad under pt in recliner (RN and NT aware).  Ambulation/Gait                 Stairs             Wheelchair Mobility    Modified Rankin (Stroke  Patients Only)       Balance Overall balance assessment: Needs assistance Sitting-balance support: Feet supported;Bilateral upper extremity supported Sitting balance-Leahy Scale: Poor Sitting balance - Comments: posterior lean exacerbated by abductor splint. pt sat EOB x 6 minutes, min A initially for posterior lean, then supervision                                    Cognition Arousal/Alertness: Awake/alert Behavior During Therapy: Impulsive Overall Cognitive Status: History of cognitive impairments - at baseline                                 General Comments: significant dementia, does not addressing questions appropriately      Exercises      General Comments        Pertinent Vitals/Pain Pain Assessment: Faces Faces Pain Scale: Hurts even more Pain Location: L hip with movement Pain Descriptors / Indicators: Guarding;Grimacing Pain Intervention(s): Limited activity within patient's tolerance;Monitored during session    Home Living                      Prior Function            PT Goals (current goals can now be found in the care plan section) Acute Rehab PT Goals Patient Stated Goal: No goals  stated PT Goal Formulation: Patient unable to participate in goal setting Time For Goal Achievement: 06/13/19 Potential to Achieve Goals: Poor Progress towards PT goals: Not progressing toward goals - comment(limited by dementia)    Frequency    Min 3X/week      PT Plan Current plan remains appropriate    Co-evaluation              AM-PAC PT "6 Clicks" Mobility   Outcome Measure  Help needed turning from your back to your side while in a flat bed without using bedrails?: Total Help needed moving from lying on your back to sitting on the side of a flat bed without using bedrails?: Total Help needed moving to and from a bed to a chair (including a wheelchair)?: Total Help needed standing up from a chair using your  arms (e.g., wheelchair or bedside chair)?: Total Help needed to walk in hospital room?: Total Help needed climbing 3-5 steps with a railing? : Total 6 Click Score: 6    End of Session Equipment Utilized During Treatment: Gait belt Activity Tolerance: Patient limited by fatigue;Patient limited by pain;Other (comment)(cognition) Patient left: with call bell/phone within reach;in chair;with chair alarm set Nurse Communication: Mobility status PT Visit Diagnosis: Muscle weakness (generalized) (M62.81);Difficulty in walking, not elsewhere classified (R26.2)     Time: [id number] PT Time Calculation (min) (ACUTE ONLY): 14 min  Charges:  $Therapeutic Activity: 8-22 mins                     Blondell Reveal Kistler PT 06/11/2019  Acute Rehabilitation Services Pager [phone] Office [phone]

## 2019-06-12 ENCOUNTER — Encounter (HOSPITAL_COMMUNITY): Payer: Self-pay

## 2019-06-12 DIAGNOSIS — R531 Weakness: Secondary | ICD-10-CM | POA: Diagnosis not present

## 2019-06-12 DIAGNOSIS — M6281 Muscle weakness (generalized): Secondary | ICD-10-CM | POA: Diagnosis not present

## 2019-06-12 DIAGNOSIS — R1111 Vomiting without nausea: Secondary | ICD-10-CM | POA: Diagnosis not present

## 2019-06-12 DIAGNOSIS — R41841 Cognitive communication deficit: Secondary | ICD-10-CM | POA: Diagnosis not present

## 2019-06-12 DIAGNOSIS — R1311 Dysphagia, oral phase: Secondary | ICD-10-CM | POA: Diagnosis not present

## 2019-06-12 DIAGNOSIS — M069 Rheumatoid arthritis, unspecified: Secondary | ICD-10-CM | POA: Diagnosis not present

## 2019-06-12 DIAGNOSIS — M25552 Pain in left hip: Secondary | ICD-10-CM | POA: Diagnosis not present

## 2019-06-12 DIAGNOSIS — M2550 Pain in unspecified joint: Secondary | ICD-10-CM | POA: Diagnosis not present

## 2019-06-12 DIAGNOSIS — F330 Major depressive disorder, recurrent, mild: Secondary | ICD-10-CM | POA: Diagnosis not present

## 2019-06-12 DIAGNOSIS — K920 Hematemesis: Secondary | ICD-10-CM | POA: Diagnosis not present

## 2019-06-12 DIAGNOSIS — Z96642 Presence of left artificial hip joint: Secondary | ICD-10-CM | POA: Diagnosis not present

## 2019-06-12 DIAGNOSIS — R2689 Other abnormalities of gait and mobility: Secondary | ICD-10-CM | POA: Diagnosis not present

## 2019-06-12 DIAGNOSIS — Z471 Aftercare following joint replacement surgery: Secondary | ICD-10-CM | POA: Diagnosis not present

## 2019-06-12 DIAGNOSIS — F0390 Unspecified dementia without behavioral disturbance: Secondary | ICD-10-CM | POA: Diagnosis not present

## 2019-06-12 DIAGNOSIS — N39498 Other specified urinary incontinence: Secondary | ICD-10-CM | POA: Diagnosis not present

## 2019-06-12 DIAGNOSIS — I10 Essential (primary) hypertension: Secondary | ICD-10-CM | POA: Diagnosis not present

## 2019-06-12 DIAGNOSIS — K922 Gastrointestinal hemorrhage, unspecified: Secondary | ICD-10-CM | POA: Diagnosis not present

## 2019-06-12 DIAGNOSIS — R0902 Hypoxemia: Secondary | ICD-10-CM | POA: Diagnosis not present

## 2019-06-12 DIAGNOSIS — F29 Unspecified psychosis not due to a substance or known physiological condition: Secondary | ICD-10-CM | POA: Diagnosis not present

## 2019-06-12 DIAGNOSIS — S73005A Unspecified dislocation of left hip, initial encounter: Secondary | ICD-10-CM | POA: Diagnosis not present

## 2019-06-12 DIAGNOSIS — R404 Transient alteration of awareness: Secondary | ICD-10-CM | POA: Diagnosis not present

## 2019-06-12 DIAGNOSIS — Z7401 Bed confinement status: Secondary | ICD-10-CM | POA: Diagnosis not present

## 2019-06-12 LAB — SARS CORONAVIRUS 2 (TAT 6-24 HRS): SARS Coronavirus 2: NEGATIVE

## 2019-06-12 LAB — BASIC METABOLIC PANEL
Anion gap: 11 (ref 5–15)
BUN: 13 mg/dL (ref 8–23)
CO2: 21 mmol/L — ABNORMAL LOW (ref 22–32)
Calcium: 8.6 mg/dL — ABNORMAL LOW (ref 8.9–10.3)
Chloride: 106 mmol/L (ref 98–111)
Creatinine, Ser: 0.61 mg/dL (ref 0.44–1.00)
GFR calc Af Amer: >60 mL/min (ref >60)
GFR calc non Af Amer: >60 mL/min (ref >60)
Glucose, Bld: 91 mg/dL (ref 70–99)
Potassium: 3.7 mmol/L (ref 3.5–5.1)
Sodium: 138 mmol/L (ref 135–145)

## 2019-06-12 LAB — CREATININE, SERUM
Creatinine, Ser: 0.62 mg/dL (ref 0.44–1.00)
GFR calc Af Amer: >60 mL/min (ref >60)
GFR calc non Af Amer: >60 mL/min (ref >60)

## 2019-06-12 NOTE — Discharge Summary (Signed)
Physician Discharge Summary  [name] SWH:[id number] DOB: 02/26/1941 DOA: 06/03/2019  PCP: Celene Squibb, MD  Admit date: 06/03/2019 Discharge date: 06/12/2019  Admitted From: home Discharge disposition: SNF   Code Status: Full Code  Diet Recommendation: Dysphagia 3 diet   Recommendations for Outpatient Follow-Up:   1. Follow-up with PCP as an outpatient.   Discharge Diagnosis:   Principal Problem:   Closed dislocation of hip, left, initial encounter [facility]) Active Problems:   Rheumatoid arthritis ([place])   Hypokalemia   Hypertension   Dementia (HCC)   History of stroke   Anxiety  History of Present Illness / Brief narrative:  [name] is an 78 y.o.F with advanced dementia, home-dwelling, hx CVA without residual weakness, hx remote BrCA, HTN, RA on methotrexate and recent hip fracture repair who presented with recurrent left hip pain.  Patient had had left posterior hemiarthroplasty for hip fracture about 6 weeks ago. Had a dislocation of the hip 2 weeks ago, reduced in the ER, has been in bed, with knee immobilizer since, but on day of admission, complained of pain.  In the ER, radiography showed recurrent superior dislocation of the femoral head prosthesis. Reduction with conscious sedation failed in the ER.  Orthopedics were consulted, who recommended conversion to total hip arthroplasty and the patient was admitted.   Subjective:  Patient seen and examined this morning.  He is an elderly Caucasian female.  Not in distress. Opens eyes on verbal command.  Nods head to answer simple questions.  Hospital Course:  Closed LEFT hip fracture, recurrent dislocation of prosthesis s/p total hip arthroplasty 10/2 Afebrile -ContinueLovenox for DVT ppx for a month post procedure. -Orthopedics recommendWBATwith walker -Hip abduction brace at all times -PT eval appreciated. SNF planned.  Cerebrovascular disease secondary prevention Hypertension BPcontrolled  -Lisinopril has remained on hold in the hospital.  Would not resume it since the blood pressure is mostly in the low normal range. -ContinuePlavix -Continuedysphagia 3 diet  Dementia Mentation is confused and babbling,appears to be near baseline. Able to follow commands. -ContinueNamenda -Continueduloxetine Delirium precautions:  -Lights and TV off, minimize interruptions at night -Blinds open and lights on during day -Glasses/hearing aid with patient -Frequent reorientation -Avoid sedation medications/Beers list medications  Rheumatoid arthritis -No active disease -Resume methotrexate on 10/16  Other medications -Continuemirabegron -ContinuePPI  Hypokalemia -Potassium has been running low in the hospital.  It was low at 3 yesterday.  Oral replacement given.  Up to 3.7 today. Prior to admission, patient was on daily supplement of 10 mEq potassium which has been resumed.  Acute blood loss anemia Expected post-op blood loss. Transfused 10/1and 10/3.Hgb increased appropriately post-transfusion  Stable for discharge to SNF today. COVID-19 test negative again on 10/7.  Discharge Exam:   Vitals:   06/10/19 0550 06/10/19 1456 06/11/19 1351 06/12/19 0227  BP: 136/67 120/64 115/64 (!) 121/52  Pulse: 91 93 77 80  Resp: 16 18 (!) 21 16  Temp: 98.3 F (36.8 C) 98.2 F (36.8 C) 98 F (36.7 C)   TempSrc: Oral Oral Oral   SpO2: 96% 95%  98%  Weight:      Height:        Body mass index is 22.58 kg/m.  General exam: Appears calm and comfortable.  Skin: No rashes, lesions or ulcers. HEENT: Atraumatic, normocephalic, supple neck, no obvious bleeding Lungs: Clear to auscultation bilaterally CVS: Regular rate and rhythm, no murmur GI/Abd soft, nontender, nondistended, bowel sound. CNS: Alert, awake, oriented to place and person, slow  to respond Psychiatry: Mood appropriate Extremities: No pedal edema, no  calf tenderness  Discharge Instructions:  Wound care: None Discharge Instructions    Increase activity slowly   Complete by: As directed      Follow-up Information    Schedule an appointment as soon as possible for a visit with Rod Can, MD.   Specialty: Orthopedic Surgery Why: For wound re-check, For suture removal Contact information: [address] [place] [address] [phone]          Allergies as of 06/12/2019      Reactions   Celecoxib Swelling, Other (See Comments)   Ibuprofen Swelling, Other (See Comments)   Diphenhydramine Rash, Other (See Comments)   Wide awake.    Wygesic [propoxyphene N-acetaminophen] Nausea And Vomiting      Medication List    STOP taking these medications   ALPRAZolam 0.25 MG tablet Commonly known as: XANAX   donepezil 10 MG tablet Commonly known as: ARICEPT   lisinopril 10 MG tablet Commonly known as: ZESTRIL     TAKE these medications   Caltrate 600+D 600-400 MG-UNIT tablet Generic drug: Calcium Carbonate-Vitamin D Take 1 tablet by mouth 2 (two) times daily.   cetirizine 10 MG tablet Commonly known as: ZYRTEC Take 10 mg by mouth daily as needed for allergies.   clopidogrel 75 MG tablet Commonly known as: PLAVIX Take 1 tablet (75 mg total) by mouth daily.   Cranberry 125 MG Tabs Take 1 tablet by mouth 3 (three) times daily as needed.   cyanocobalamin 1000 MCG tablet Take 1 tablet (1,000 mcg total) by mouth daily.   denosumab 60 MG/ML Soln injection Commonly known as: PROLIA Inject 60 mg into the skin every 6 (six) months. Administer in upper arm, thigh, or abdomen, got in June 2019   docusate sodium 100 MG capsule Commonly known as: COLACE Take 1 capsule (100 mg total) by mouth 2 (two) times daily.   DULoxetine 60 MG capsule Commonly known as: CYMBALTA Take 60 mg by mouth daily.   enoxaparin 40 MG/0.4ML injection Commonly known as: LOVENOX Inject 0.4 mLs (40 mg total) into the skin  daily.   folic acid 540 MCG tablet Commonly known as: FOLVITE Take 800 mcg by mouth daily.   HYDROcodone-acetaminophen 5-325 MG tablet Commonly known as: NORCO/VICODIN Take 1 tablet by mouth every 6 (six) hours as needed for up to 3 days for moderate pain or severe pain.   iron polysaccharides 150 MG capsule Commonly known as: NIFEREX Take 1 capsule (150 mg total) by mouth daily.   Magnesium 500 MG Caps Take 500 mg by mouth daily.   memantine 5 MG tablet Commonly known as: NAMENDA Take 5 mg by mouth 2 (two) times daily.   methocarbamol 500 MG tablet Commonly known as: ROBAXIN Take 1 tablet (500 mg total) by mouth every 6 (six) hours as needed for up to 5 days for muscle spasms.   methotrexate 2.5 MG tablet Commonly known as: RHEUMATREX Take 6 tablets (15 mg total) by mouth every Wednesday. Start taking on: June 18, 2019   Myrbetriq 25 MG Tb24 tablet Generic drug: mirabegron ER Take 25 mg by mouth daily.   omeprazole 20 MG capsule Commonly known as: PRILOSEC Take 20 mg by mouth 2 (two) times daily before a meal.   potassium chloride 10 MEQ tablet Commonly known as: KLOR-CON Take 10 mEq by mouth at bedtime.   ProAir HFA 108 (90 Base) MCG/ACT inhaler Generic drug: albuterol Inhale 2 puffs into  the lungs every 6 (six) hours as needed for wheezing or shortness of breath.   Tylenol 325 MG tablet Generic drug: acetaminophen Take 500 mg by mouth every 6 (six) hours as needed for mild pain or moderate pain.   Vitamin D 50 MCG (2000 UT) Caps Take 2,000 Units by mouth daily.   Zinc 50 MG Caps Take 50 mg by mouth daily.       Time coordinating discharge: 35 minutes  The results of significant diagnostics from this hospitalization (including imaging, microbiology, ancillary and laboratory) are listed below for reference.    Procedures and Diagnostic Studies:   Dg Chest Port 1 View  Result Date: 06/03/2019 CLINICAL DATA:  Preoperative chest radiograph prior to  hip surgery. EXAM: PORTABLE CHEST 1 VIEW COMPARISON:  08/29/2018 FINDINGS: UPPER limits normal heart size noted. There is no evidence of focal airspace disease, pulmonary edema, suspicious pulmonary nodule/mass, pleural effusion, or pneumothorax. No acute bony abnormalities are identified. IMPRESSION: UPPER limits normal heart size without evidence of acute cardiopulmonary disease. Electronically Signed   By: Margarette Canada M.D.   On: 06/03/2019 21:05   Dg Hip Unilat With Pelvis 2-3 Views Left  Result Date: 06/03/2019 CLINICAL DATA:  Acute LEFT hip dislocation. EXAM: DG HIP (WITH OR WITHOUT PELVIS) 2-3V LEFT COMPARISON:  05/28/2019 FINDINGS: LEFT femoral prosthesis noted. SUPERIOR dislocation of the prosthetic LEFT femoral head noted. A few tiny bony fragments in the acetabular cavity are age indeterminate. IMPRESSION: SUPERIOR dislocation of the LEFT femoral head prosthesis. Electronically Signed   By: Margarette Canada M.D.   On: 06/03/2019 14:49     Labs:   Basic Metabolic Panel: Recent Labs  Lab 06/07/19 0305 06/08/19 0239 06/09/19 0307 06/10/19 1108 06/12/19 0517  NA 137 136 137 133* 138  K 4.0 3.2* 3.0* 3.0* 3.7  CL 109 106 106 103 106  CO2 '23 24 27 23 ' 21*  GLUCOSE 130* 109* 111* 117* 91  BUN '16 13 13 14 13  ' CREATININE 0.59 0.63 0.70 0.63 0.61  0.62  CALCIUM 8.7* 8.5* 8.5* 8.4* 8.6*   GFR Estimated Creatinine Clearance: 44.4 mL/min (by C-G formula based on SCr of 0.62 mg/dL). Liver Function Tests: Recent Labs  Lab 06/06/19 0246 06/08/19 0239  AST 34 40  ALT 18 18  ALKPHOS 60 75  BILITOT 1.0 1.4*  PROT 5.0* 5.1*  ALBUMIN 2.8* 2.5*   No results for input(s): LIPASE, AMYLASE in the last 168 hours. No results for input(s): AMMONIA in the last 168 hours. Coagulation profile No results for input(s): INR, PROTIME in the last 168 hours.  CBC: Recent Labs  Lab 06/05/19 1742 06/06/19 0246 06/07/19 0305 06/08/19 0239 06/09/19 0307  WBC 18.5* 10.9* 14.6* 12.0* 10.0  HGB 6.6*  8.2* 7.1* 9.6* 9.7*  HCT 22.1* 26.8* 23.2* 30.4* 31.2*  MCV 106.3* 93.1 95.1 94.4 96.3  PLT 243 172 225 176 154   Cardiac Enzymes: No results for input(s): CKTOTAL, CKMB, CKMBINDEX, TROPONINI in the last 168 hours. BNP: Invalid input(s): POCBNP CBG: Recent Labs  Lab 06/06/19 0109  GLUCAP 144*   D-Dimer No results for input(s): DDIMER in the last 72 hours. Hgb A1c No results for input(s): HGBA1C in the last 72 hours. Lipid Profile No results for input(s): CHOL, HDL, LDLCALC, TRIG, CHOLHDL, LDLDIRECT in the last 72 hours. Thyroid function studies No results for input(s): TSH, T4TOTAL, T3FREE, THYROIDAB in the last 72 hours.  Invalid input(s): FREET3 Anemia work up No results for input(s): VITAMINB12, FOLATE, FERRITIN, TIBC, IRON,  RETICCTPCT in the last 72 hours. Microbiology Recent Results (from the past 240 hour(s))  SARS Coronavirus 2 [facility] order, Performed in [facility] hospital lab) Nasopharyngeal Nasopharyngeal Swab     Status: None   Collection Time: 06/03/19  7:55 PM   Specimen: Nasopharyngeal Swab  Result Value Ref Range Status   SARS Coronavirus 2 NEGATIVE NEGATIVE Final    Comment: (NOTE) If result is NEGATIVE SARS-CoV-2 target nucleic acids are NOT DETECTED. The SARS-CoV-2 RNA is generally detectable in upper and lower  respiratory specimens during the acute phase of infection. The lowest  concentration of SARS-CoV-2 viral copies this assay can detect is 250  copies / mL. A negative result does not preclude SARS-CoV-2 infection  and should not be used as the sole basis for treatment or other  patient management decisions.  A negative result may occur with  improper specimen collection / handling, submission of specimen other  than nasopharyngeal swab, presence of viral mutation(s) within the  areas targeted by this assay, and inadequate number of viral copies  (<250 copies / mL). A negative result must be combined with clinical  observations, patient  history, and epidemiological information. If result is POSITIVE SARS-CoV-2 target nucleic acids are DETECTED. The SARS-CoV-2 RNA is generally detectable in upper and lower  respiratory specimens dur ing the acute phase of infection.  Positive  results are indicative of active infection with SARS-CoV-2.  Clinical  correlation with patient history and other diagnostic information is  necessary to determine patient infection status.  Positive results do  not rule out bacterial infection or co-infection with other viruses. If result is PRESUMPTIVE POSTIVE SARS-CoV-2 nucleic acids MAY BE PRESENT.   A presumptive positive result was obtained on the submitted specimen  and confirmed on repeat testing.  While 2019 novel coronavirus  (SARS-CoV-2) nucleic acids may be present in the submitted sample  additional confirmatory testing may be necessary for epidemiological  and / or clinical management purposes  to differentiate between  SARS-CoV-2 and other Sarbecovirus currently known to infect humans.  If clinically indicated additional testing with an alternate test  methodology [id number]) is advised. The SARS-CoV-2 RNA is generally  detectable in upper and lower respiratory sp ecimens during the acute  phase of infection. The expected result is Negative. Fact Sheet for Patients:  StrictlyIdeas.no Fact Sheet for Healthcare Providers: BankingDealers.co.za This test is not yet approved or cleared by the [place] FDA and has been authorized for detection and/or diagnosis of SARS-CoV-2 by FDA under an Emergency Use Authorization (EUA).  This EUA will remain in effect (meaning this test can be used) for the duration of the COVID-19 declaration under Section 564(b)(1) of the Act, 21 U.S.C. section 360bbb-3(b)(1), unless the authorization is terminated or revoked sooner. Performed at [facility], [address], [place], [place] [address]   Culture,  blood (x 2)     Status: None   Collection Time: 06/03/19 11:07 PM   Specimen: BLOOD  Result Value Ref Range Status   Specimen Description BLOOD LEFT ANTECUBITAL  Final   Special Requests   Final    BOTTLES DRAWN AEROBIC AND ANAEROBIC Blood Culture adequate volume   Culture   Final    NO GROWTH 5 DAYS Performed at [facility], [address], [place], [place] [address]    Report Status 06/08/2019 FINAL  Final  Culture, blood (x 2)     Status: None   Collection Time: 06/03/19 11:08 PM   Specimen: BLOOD  Result Value  Ref Range Status   Specimen Description BLOOD RIGHT ANTECUBITAL  Final   Special Requests   Final    BOTTLES DRAWN AEROBIC AND ANAEROBIC Blood Culture adequate volume   Culture   Final    NO GROWTH 5 DAYS Performed at [facility], [address], [place], [place] [address]    Report Status 06/08/2019 FINAL  Final  Surgical PCR screen     Status: Abnormal   Collection Time: 06/04/19  1:33 AM   Specimen: Nasal Mucosa; Nasal Swab  Result Value Ref Range Status   MRSA, PCR POSITIVE (A) NEGATIVE Final    Comment: RESULT CALLED TO, READ BACK BY AND VERIFIED WITH: MCKINNEY, S. '@[id number]'  ON 9.30.2020 BY NMCCOY    Staphylococcus aureus POSITIVE (A) NEGATIVE Final    Comment: (NOTE) The Xpert SA Assay (FDA approved for NASAL specimens in patients 73 years of age and older), is one component of a comprehensive surveillance program. It is not intended to diagnose infection nor to guide or monitor treatment. Performed at [facility], [place] [address], [place], [place] [address]   SARS CORONAVIRUS 2 (TAT 6-24 HRS) Nasopharyngeal Nasopharyngeal Swab     Status: None   Collection Time: 06/11/19  4:40 PM   Specimen: Nasopharyngeal Swab  Result Value Ref Range Status   SARS Coronavirus 2 NEGATIVE NEGATIVE Final    Comment: (NOTE) SARS-CoV-2 target nucleic acids are NOT DETECTED. The SARS-CoV-2 RNA is generally detectable in upper and lower respiratory specimens  during the acute phase of infection. Negative results do not preclude SARS-CoV-2 infection, do not rule out co-infections with other pathogens, and should not be used as the sole basis for treatment or other patient management decisions. Negative results must be combined with clinical observations, patient history, and epidemiological information. The expected result is Negative. Fact Sheet for Patients: SugarRoll.be Fact Sheet for Healthcare Providers: https://www.woods-mathews.com/ This test is not yet approved or cleared by the [place] FDA and  has been authorized for detection and/or diagnosis of SARS-CoV-2 by FDA under an Emergency Use Authorization (EUA). This EUA will remain  in effect (meaning this test can be used) for the duration of the COVID-19 declaration under Section 56 4(b)(1) of the Act, 21 U.S.C. section 360bbb-3(b)(1), unless the authorization is terminated or revoked sooner. Performed at [facility], [place] [address], [place], [place] [address]     Please note: You were cared for by a hospitalist during your hospital stay. Once you are discharged, your primary care physician will handle any further medical issues. Please note that NO REFILLS for any discharge medications will be authorized once you are discharged, as it is imperative that you return to your primary care physician (or establish a relationship with a primary care physician if you do not have one) for your post hospital discharge needs so that they can reassess your need for medications and monitor your lab values.  Signed: Terrilee Croak  [facility] 06/12/2019, 9:56 AM

## 2019-06-12 NOTE — Progress Notes (Signed)
Called and gave report to [facility] at [facility] [phone].

## 2019-06-12 NOTE — TOC Transition Note (Signed)
Transition of Care [facility]) - CM/SW Discharge Note   Patient Details  Name: [name] MRN: [id number] Date of Birth: 01-Apr-1941  Transition of Care [facility]) CM/SW Contact:  Jesselyn Rask, Marjie Skiff, RN Phone [phone] 06/12/2019, 10:34 AM   Clinical Narrative:    Pt to dc to [facility] SNF today. [facility] called for transport. Daughter called to alert that transport scheduled for 12 noon. RN to call report to [phone].   Final next level of care: Skilled Nursing Facility Barriers to Discharge: No Barriers Identified   Patient Goals and CMS Choice Patient states their goals for this hospitalization and ongoing recovery are:: Daughter: to walk CMS Medicare.gov Compare Post Acute Care list provided to:: Other (Comment Required)(Adult Children) Choice offered to / list presented to : Adult Children   Discharge Plan and Services In-house Referral: Clinical Social Work Discharge Planning Services: CM Consult Post Acute Care Choice: Skilled Nursing Facility                               Social Determinants of Health (SDOH) Interventions     Readmission Risk Interventions Readmission Risk Prevention Plan 06/10/2019  Transportation Screening Complete  Medication Review Press photographer) Complete  PCP or Specialist appointment within 3-5 days of discharge Not Complete  PCP/Specialist Appt Not Complete comments Not ready for dc  HRI or [place] Not Complete  HRI or Home Care Consult Pt Refusal Comments NA  SW Recovery Care/Counseling Consult Not Complete  SW Consult Not Complete Comments NA  Palliative Care Screening Not Applicable  Skilled Nursing Facility Complete  Some recent data might be hidden

## 2019-06-12 NOTE — Progress Notes (Signed)
Patient transferred to [facility] by [facility].

## 2019-06-12 NOTE — Care Management Important Message (Signed)
Important Message  Patient Details IM Letter given to Theophilus Kinds RN to present to the PatientName: [name] MRN: [id number] Date of Birth: 1941/06/12   Medicare Important Message Given:  Yes     Kerin Salen 06/12/2019, 9:26 AM

## 2019-06-13 DIAGNOSIS — M069 Rheumatoid arthritis, unspecified: Secondary | ICD-10-CM | POA: Diagnosis not present

## 2019-06-13 DIAGNOSIS — N39498 Other specified urinary incontinence: Secondary | ICD-10-CM | POA: Diagnosis not present

## 2019-06-13 DIAGNOSIS — M25552 Pain in left hip: Secondary | ICD-10-CM | POA: Diagnosis not present

## 2019-06-13 DIAGNOSIS — F330 Major depressive disorder, recurrent, mild: Secondary | ICD-10-CM | POA: Diagnosis not present

## 2019-06-15 DIAGNOSIS — K920 Hematemesis: Secondary | ICD-10-CM | POA: Diagnosis not present

## 2019-06-15 DIAGNOSIS — K922 Gastrointestinal hemorrhage, unspecified: Secondary | ICD-10-CM | POA: Diagnosis not present

## 2019-06-15 DIAGNOSIS — R404 Transient alteration of awareness: Secondary | ICD-10-CM | POA: Diagnosis not present

## 2019-06-15 DIAGNOSIS — R1111 Vomiting without nausea: Secondary | ICD-10-CM | POA: Diagnosis not present

## 2019-06-16 ENCOUNTER — Encounter (HOSPITAL_COMMUNITY): Payer: Self-pay | Admitting: Orthopedic Surgery

## 2019-06-16 DIAGNOSIS — R9089 Other abnormal findings on diagnostic imaging of central nervous system: Secondary | ICD-10-CM | POA: Diagnosis not present

## 2019-06-16 DIAGNOSIS — D696 Thrombocytopenia, unspecified: Secondary | ICD-10-CM | POA: Diagnosis not present

## 2019-06-16 DIAGNOSIS — Z20828 Contact with and (suspected) exposure to other viral communicable diseases: Secondary | ICD-10-CM | POA: Diagnosis not present

## 2019-06-16 DIAGNOSIS — K449 Diaphragmatic hernia without obstruction or gangrene: Secondary | ICD-10-CM | POA: Diagnosis not present

## 2019-06-16 DIAGNOSIS — Z79899 Other long term (current) drug therapy: Secondary | ICD-10-CM | POA: Diagnosis not present

## 2019-06-16 DIAGNOSIS — R042 Hemoptysis: Secondary | ICD-10-CM | POA: Diagnosis not present

## 2019-06-16 DIAGNOSIS — J9811 Atelectasis: Secondary | ICD-10-CM | POA: Diagnosis not present

## 2019-06-16 DIAGNOSIS — Z96642 Presence of left artificial hip joint: Secondary | ICD-10-CM | POA: Diagnosis not present

## 2019-06-16 DIAGNOSIS — Z4682 Encounter for fitting and adjustment of non-vascular catheter: Secondary | ICD-10-CM | POA: Diagnosis not present

## 2019-06-16 DIAGNOSIS — F0390 Unspecified dementia without behavioral disturbance: Secondary | ICD-10-CM | POA: Diagnosis not present

## 2019-06-16 DIAGNOSIS — D689 Coagulation defect, unspecified: Secondary | ICD-10-CM | POA: Diagnosis not present

## 2019-06-16 DIAGNOSIS — Z452 Encounter for adjustment and management of vascular access device: Secondary | ICD-10-CM | POA: Diagnosis not present

## 2019-06-16 DIAGNOSIS — Z9889 Other specified postprocedural states: Secondary | ICD-10-CM | POA: Diagnosis not present

## 2019-06-16 DIAGNOSIS — R579 Shock, unspecified: Secondary | ICD-10-CM | POA: Diagnosis not present

## 2019-06-16 DIAGNOSIS — Z66 Do not resuscitate: Secondary | ICD-10-CM | POA: Diagnosis not present

## 2019-06-16 DIAGNOSIS — K625 Hemorrhage of anus and rectum: Secondary | ICD-10-CM | POA: Diagnosis not present

## 2019-06-16 DIAGNOSIS — G92 Toxic encephalopathy: Secondary | ICD-10-CM | POA: Diagnosis not present

## 2019-06-16 DIAGNOSIS — Z515 Encounter for palliative care: Secondary | ICD-10-CM | POA: Diagnosis not present

## 2019-06-16 DIAGNOSIS — K7460 Unspecified cirrhosis of liver: Secondary | ICD-10-CM | POA: Diagnosis not present

## 2019-06-16 DIAGNOSIS — I82402 Acute embolism and thrombosis of unspecified deep veins of left lower extremity: Secondary | ICD-10-CM | POA: Diagnosis not present

## 2019-06-16 DIAGNOSIS — D65 Disseminated intravascular coagulation [defibrination syndrome]: Secondary | ICD-10-CM | POA: Diagnosis not present

## 2019-06-16 DIAGNOSIS — R9389 Abnormal findings on diagnostic imaging of other specified body structures: Secondary | ICD-10-CM | POA: Diagnosis not present

## 2019-06-16 DIAGNOSIS — K920 Hematemesis: Secondary | ICD-10-CM | POA: Diagnosis not present

## 2019-06-16 DIAGNOSIS — D62 Acute posthemorrhagic anemia: Secondary | ICD-10-CM | POA: Diagnosis not present

## 2019-06-16 DIAGNOSIS — E876 Hypokalemia: Secondary | ICD-10-CM | POA: Diagnosis not present

## 2019-06-16 DIAGNOSIS — I82492 Acute embolism and thrombosis of other specified deep vein of left lower extremity: Secondary | ICD-10-CM | POA: Diagnosis not present

## 2019-06-16 DIAGNOSIS — Z888 Allergy status to other drugs, medicaments and biological substances status: Secondary | ICD-10-CM | POA: Diagnosis not present

## 2019-06-16 DIAGNOSIS — Z8673 Personal history of transient ischemic attack (TIA), and cerebral infarction without residual deficits: Secondary | ICD-10-CM | POA: Diagnosis not present

## 2019-06-16 DIAGNOSIS — I82409 Acute embolism and thrombosis of unspecified deep veins of unspecified lower extremity: Secondary | ICD-10-CM | POA: Diagnosis not present

## 2019-06-16 DIAGNOSIS — J90 Pleural effusion, not elsewhere classified: Secondary | ICD-10-CM | POA: Diagnosis not present

## 2019-06-16 DIAGNOSIS — R112 Nausea with vomiting, unspecified: Secondary | ICD-10-CM | POA: Diagnosis not present

## 2019-06-16 DIAGNOSIS — K766 Portal hypertension: Secondary | ICD-10-CM | POA: Diagnosis not present

## 2019-06-16 DIAGNOSIS — I8500 Esophageal varices without bleeding: Secondary | ICD-10-CM | POA: Diagnosis not present

## 2019-06-16 DIAGNOSIS — I82412 Acute embolism and thrombosis of left femoral vein: Secondary | ICD-10-CM | POA: Diagnosis not present

## 2019-06-16 DIAGNOSIS — I10 Essential (primary) hypertension: Secondary | ICD-10-CM | POA: Diagnosis not present

## 2019-06-16 DIAGNOSIS — R188 Other ascites: Secondary | ICD-10-CM | POA: Diagnosis not present

## 2019-06-16 DIAGNOSIS — Z7902 Long term (current) use of antithrombotics/antiplatelets: Secondary | ICD-10-CM | POA: Diagnosis not present

## 2019-06-16 DIAGNOSIS — C50919 Malignant neoplasm of unspecified site of unspecified female breast: Secondary | ICD-10-CM | POA: Diagnosis not present

## 2019-06-16 DIAGNOSIS — K7290 Hepatic failure, unspecified without coma: Secondary | ICD-10-CM | POA: Diagnosis not present

## 2019-06-16 DIAGNOSIS — I824Z2 Acute embolism and thrombosis of unspecified deep veins of left distal lower extremity: Secondary | ICD-10-CM | POA: Diagnosis not present

## 2019-06-16 DIAGNOSIS — R109 Unspecified abdominal pain: Secondary | ICD-10-CM | POA: Diagnosis not present

## 2019-06-16 DIAGNOSIS — K7469 Other cirrhosis of liver: Secondary | ICD-10-CM | POA: Diagnosis not present

## 2019-06-16 DIAGNOSIS — M069 Rheumatoid arthritis, unspecified: Secondary | ICD-10-CM | POA: Diagnosis not present

## 2019-06-16 DIAGNOSIS — K921 Melena: Secondary | ICD-10-CM | POA: Diagnosis not present

## 2019-06-16 DIAGNOSIS — Z853 Personal history of malignant neoplasm of breast: Secondary | ICD-10-CM | POA: Diagnosis not present

## 2019-06-16 DIAGNOSIS — S7292XA Unspecified fracture of left femur, initial encounter for closed fracture: Secondary | ICD-10-CM | POA: Diagnosis not present

## 2019-06-16 DIAGNOSIS — Z886 Allergy status to analgesic agent status: Secondary | ICD-10-CM | POA: Diagnosis not present

## 2019-06-16 DIAGNOSIS — E860 Dehydration: Secondary | ICD-10-CM | POA: Diagnosis not present

## 2019-06-16 DIAGNOSIS — I959 Hypotension, unspecified: Secondary | ICD-10-CM | POA: Diagnosis not present

## 2019-06-16 DIAGNOSIS — K2970 Gastritis, unspecified, without bleeding: Secondary | ICD-10-CM | POA: Diagnosis not present

## 2019-06-16 DIAGNOSIS — D72829 Elevated white blood cell count, unspecified: Secondary | ICD-10-CM | POA: Diagnosis not present

## 2019-06-16 DIAGNOSIS — M06059 Rheumatoid arthritis without rheumatoid factor, unspecified hip: Secondary | ICD-10-CM | POA: Diagnosis not present

## 2019-06-16 DIAGNOSIS — R918 Other nonspecific abnormal finding of lung field: Secondary | ICD-10-CM | POA: Diagnosis not present

## 2019-06-16 DIAGNOSIS — R578 Other shock: Secondary | ICD-10-CM | POA: Diagnosis not present

## 2019-06-16 DIAGNOSIS — I824Y2 Acute embolism and thrombosis of unspecified deep veins of left proximal lower extremity: Secondary | ICD-10-CM | POA: Diagnosis not present

## 2019-06-16 DIAGNOSIS — R4182 Altered mental status, unspecified: Secondary | ICD-10-CM | POA: Diagnosis not present

## 2019-06-16 DIAGNOSIS — F0150 Vascular dementia without behavioral disturbance: Secondary | ICD-10-CM | POA: Diagnosis not present

## 2019-06-16 DIAGNOSIS — K922 Gastrointestinal hemorrhage, unspecified: Secondary | ICD-10-CM | POA: Diagnosis not present

## 2019-06-16 DIAGNOSIS — I8510 Secondary esophageal varices without bleeding: Secondary | ICD-10-CM | POA: Diagnosis not present

## 2019-06-16 DIAGNOSIS — Z8679 Personal history of other diseases of the circulatory system: Secondary | ICD-10-CM | POA: Diagnosis not present

## 2019-08-05 DEATH — deceased

## 2021-01-06 IMAGING — DX DG PORTABLE PELVIS
1 series · 1 of 1 positions shown · non-contrast
Comparison: Portable exam 5367 hours compared to 05/28/2019

CLINICAL DATA: Posterior total hip revision

EXAM:
PORTABLE PELVIS 1-2 VIEWS

[pelvis ap]
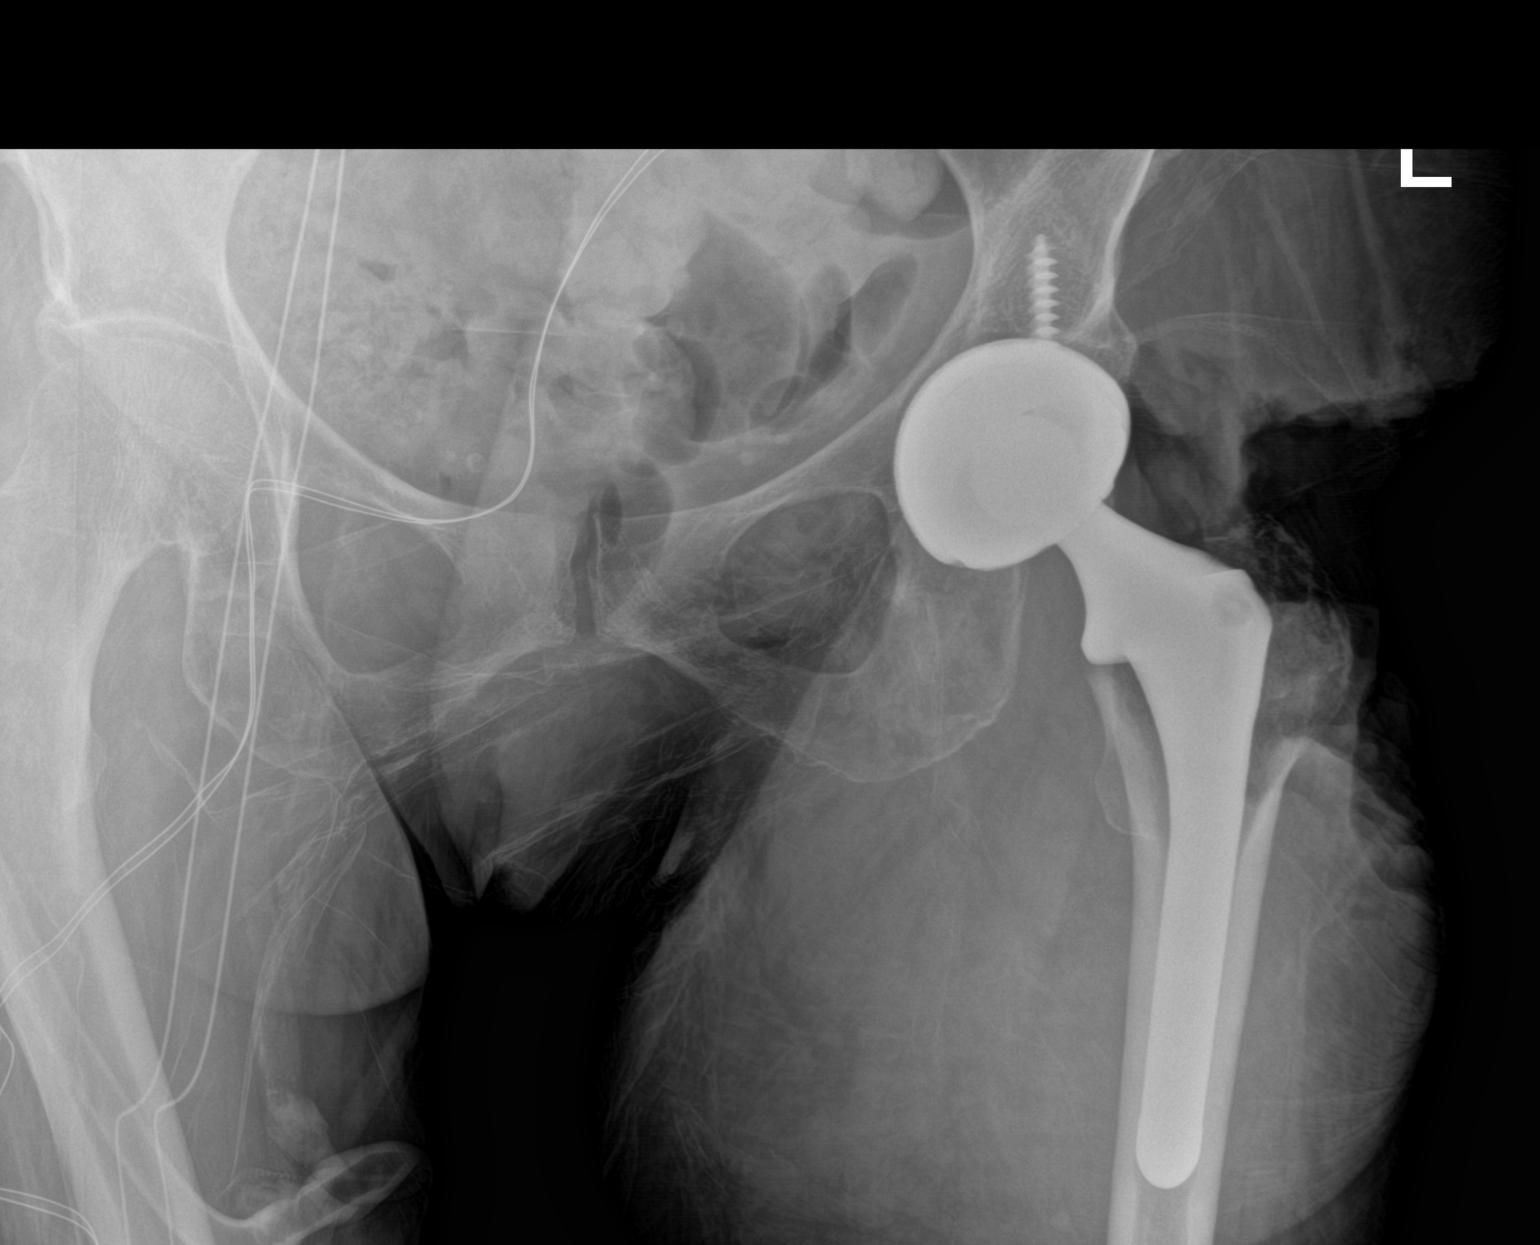

[1 of 1 positions shown; findings below may reference images not displayed]

FINDINGS: Acetabular and femoral components of a LEFT hip prosthesis
identified.

No acute fracture, dislocation, or bone destruction.

Osseous demineralization.
IMPRESSION: LEFT hip prosthesis without acute complication.
